# Patient Record
Sex: Male | Born: 1935
Health system: Southern US, Community
[De-identification: ages and names within clinical notes are randomized; demographics above are authoritative.]

## PROBLEM LIST (undated history)

## (undated) ENCOUNTER — Emergency Department (HOSPITAL_COMMUNITY): Admission: EM | Payer: Medicare Other | Source: Home / Self Care

## (undated) DIAGNOSIS — I639 Cerebral infarction, unspecified: Secondary | ICD-10-CM

## (undated) DIAGNOSIS — K635 Polyp of colon: Secondary | ICD-10-CM

## (undated) DIAGNOSIS — E785 Hyperlipidemia, unspecified: Secondary | ICD-10-CM

## (undated) DIAGNOSIS — M1990 Unspecified osteoarthritis, unspecified site: Secondary | ICD-10-CM

## (undated) DIAGNOSIS — I10 Essential (primary) hypertension: Secondary | ICD-10-CM

## (undated) DIAGNOSIS — G8929 Other chronic pain: Secondary | ICD-10-CM

## (undated) DIAGNOSIS — F0390 Unspecified dementia without behavioral disturbance: Secondary | ICD-10-CM

## (undated) DIAGNOSIS — E119 Type 2 diabetes mellitus without complications: Secondary | ICD-10-CM

## (undated) DIAGNOSIS — K5790 Diverticulosis of intestine, part unspecified, without perforation or abscess without bleeding: Secondary | ICD-10-CM

## (undated) DIAGNOSIS — F32A Depression, unspecified: Secondary | ICD-10-CM

## (undated) DIAGNOSIS — M25551 Pain in right hip: Secondary | ICD-10-CM

## (undated) DIAGNOSIS — J189 Pneumonia, unspecified organism: Secondary | ICD-10-CM

## (undated) DIAGNOSIS — F329 Major depressive disorder, single episode, unspecified: Secondary | ICD-10-CM

## (undated) HISTORY — PX: TONSILLECTOMY: SUR1361

## (undated) HISTORY — DX: Hyperlipidemia, unspecified: E78.5

## (undated) HISTORY — DX: Diverticulosis of intestine, part unspecified, without perforation or abscess without bleeding: K57.90

## (undated) HISTORY — DX: Polyp of colon: K63.5

## (undated) HISTORY — DX: Cerebral infarction, unspecified: I63.9

## (undated) HISTORY — DX: Essential (primary) hypertension: I10

## (undated) HISTORY — DX: Unspecified osteoarthritis, unspecified site: M19.90

## (undated) HISTORY — PX: INGUINAL HERNIA REPAIR: SUR1180

---

## 2002-09-07 ENCOUNTER — Encounter: Payer: Self-pay | Admitting: Emergency Medicine

## 2002-09-08 ENCOUNTER — Inpatient Hospital Stay (HOSPITAL_COMMUNITY): Admission: AD | Admit: 2002-09-08 | Discharge: 2002-09-11 | Payer: Self-pay | Admitting: Psychiatry

## 2008-12-29 ENCOUNTER — Ambulatory Visit: Payer: Self-pay | Admitting: Internal Medicine

## 2008-12-29 ENCOUNTER — Encounter (INDEPENDENT_AMBULATORY_CARE_PROVIDER_SITE_OTHER): Payer: Self-pay | Admitting: Internal Medicine

## 2008-12-29 ENCOUNTER — Inpatient Hospital Stay (HOSPITAL_COMMUNITY): Admission: EM | Admit: 2008-12-29 | Discharge: 2009-01-10 | Payer: Self-pay | Admitting: Emergency Medicine

## 2008-12-30 ENCOUNTER — Encounter (INDEPENDENT_AMBULATORY_CARE_PROVIDER_SITE_OTHER): Payer: Self-pay | Admitting: Internal Medicine

## 2009-01-02 ENCOUNTER — Encounter (INDEPENDENT_AMBULATORY_CARE_PROVIDER_SITE_OTHER): Payer: Self-pay | Admitting: Neurology

## 2009-01-03 ENCOUNTER — Ambulatory Visit: Payer: Self-pay | Admitting: Physical Medicine & Rehabilitation

## 2009-01-10 ENCOUNTER — Ambulatory Visit: Payer: Self-pay | Admitting: Physical Medicine & Rehabilitation

## 2009-01-10 ENCOUNTER — Inpatient Hospital Stay (HOSPITAL_COMMUNITY)
Admission: RE | Admit: 2009-01-10 | Discharge: 2009-01-20 | Payer: Self-pay | Admitting: Physical Medicine & Rehabilitation

## 2009-02-27 ENCOUNTER — Encounter
Admission: RE | Admit: 2009-02-27 | Discharge: 2009-02-28 | Payer: Self-pay | Admitting: Physical Medicine & Rehabilitation

## 2009-02-28 ENCOUNTER — Ambulatory Visit: Payer: Self-pay | Admitting: Physical Medicine & Rehabilitation

## 2010-02-18 ENCOUNTER — Encounter: Payer: Self-pay | Admitting: Physical Medicine & Rehabilitation

## 2010-04-30 LAB — GLUCOSE, CAPILLARY
Glucose-Capillary: 107 mg/dL — ABNORMAL HIGH (ref 70–99)
Glucose-Capillary: 145 mg/dL — ABNORMAL HIGH (ref 70–99)
Glucose-Capillary: 151 mg/dL — ABNORMAL HIGH (ref 70–99)
Glucose-Capillary: 159 mg/dL — ABNORMAL HIGH (ref 70–99)
Glucose-Capillary: 168 mg/dL — ABNORMAL HIGH (ref 70–99)
Glucose-Capillary: 184 mg/dL — ABNORMAL HIGH (ref 70–99)
Glucose-Capillary: 251 mg/dL — ABNORMAL HIGH (ref 70–99)
Glucose-Capillary: 254 mg/dL — ABNORMAL HIGH (ref 70–99)
Glucose-Capillary: 268 mg/dL — ABNORMAL HIGH (ref 70–99)
Glucose-Capillary: 281 mg/dL — ABNORMAL HIGH (ref 70–99)
Glucose-Capillary: 294 mg/dL — ABNORMAL HIGH (ref 70–99)
Glucose-Capillary: 348 mg/dL — ABNORMAL HIGH (ref 70–99)
Glucose-Capillary: 61 mg/dL — ABNORMAL LOW (ref 70–99)
Glucose-Capillary: 62 mg/dL — ABNORMAL LOW (ref 70–99)
Glucose-Capillary: 78 mg/dL (ref 70–99)
Glucose-Capillary: 81 mg/dL (ref 70–99)
Glucose-Capillary: 82 mg/dL (ref 70–99)

## 2010-04-30 LAB — CBC
Platelets: 376 10*3/uL (ref 150–400)
RBC: 4.11 MIL/uL — ABNORMAL LOW (ref 4.22–5.81)
WBC: 11.4 10*3/uL — ABNORMAL HIGH (ref 4.0–10.5)

## 2010-04-30 LAB — BASIC METABOLIC PANEL
Calcium: 9.2 mg/dL (ref 8.4–10.5)
Creatinine, Ser: 0.98 mg/dL (ref 0.4–1.5)
GFR calc Af Amer: >60 mL/min (ref >60)

## 2010-05-01 LAB — BASIC METABOLIC PANEL
BUN: 10 mg/dL (ref 6–23)
BUN: 12 mg/dL (ref 6–23)
BUN: 12 mg/dL (ref 6–23)
BUN: 12 mg/dL (ref 6–23)
BUN: 15 mg/dL (ref 6–23)
BUN: 5 mg/dL — ABNORMAL LOW (ref 6–23)
BUN: 5 mg/dL — ABNORMAL LOW (ref 6–23)
BUN: 7 mg/dL (ref 6–23)
BUN: 7 mg/dL (ref 6–23)
CO2: 23 mEq/L (ref 19–32)
CO2: 23 mEq/L (ref 19–32)
CO2: 24 mEq/L (ref 19–32)
CO2: 24 mEq/L (ref 19–32)
CO2: 25 mEq/L (ref 19–32)
CO2: 25 mEq/L (ref 19–32)
CO2: 26 mEq/L (ref 19–32)
CO2: 26 mEq/L (ref 19–32)
CO2: 27 mEq/L (ref 19–32)
Calcium: 8.5 mg/dL (ref 8.4–10.5)
Calcium: 8.7 mg/dL (ref 8.4–10.5)
Calcium: 8.7 mg/dL (ref 8.4–10.5)
Calcium: 9 mg/dL (ref 8.4–10.5)
Calcium: 9 mg/dL (ref 8.4–10.5)
Calcium: 9.2 mg/dL (ref 8.4–10.5)
Calcium: 9.5 mg/dL (ref 8.4–10.5)
Calcium: 9.8 mg/dL (ref 8.4–10.5)
Chloride: 102 mEq/L (ref 96–112)
Chloride: 102 mEq/L (ref 96–112)
Chloride: 104 mEq/L (ref 96–112)
Chloride: 105 mEq/L (ref 96–112)
Chloride: 105 mEq/L (ref 96–112)
Chloride: 106 mEq/L (ref 96–112)
Chloride: 107 mEq/L (ref 96–112)
Chloride: 107 mEq/L (ref 96–112)
Chloride: 107 mEq/L (ref 96–112)
Creatinine, Ser: 0.74 mg/dL (ref 0.4–1.5)
Creatinine, Ser: 0.81 mg/dL (ref 0.4–1.5)
Creatinine, Ser: 0.82 mg/dL (ref 0.4–1.5)
Creatinine, Ser: 0.83 mg/dL (ref 0.4–1.5)
Creatinine, Ser: 0.84 mg/dL (ref 0.4–1.5)
Creatinine, Ser: 0.89 mg/dL (ref 0.4–1.5)
Creatinine, Ser: 0.9 mg/dL (ref 0.4–1.5)
Creatinine, Ser: 1.09 mg/dL (ref 0.4–1.5)
GFR calc Af Amer: >60 mL/min (ref >60)
GFR calc Af Amer: >60 mL/min (ref >60)
GFR calc Af Amer: >60 mL/min (ref >60)
GFR calc Af Amer: >60 mL/min (ref >60)
GFR calc Af Amer: >60 mL/min (ref >60)
GFR calc Af Amer: >60 mL/min (ref >60)
GFR calc Af Amer: >60 mL/min (ref >60)
GFR calc non Af Amer: >60 mL/min (ref >60)
GFR calc non Af Amer: >60 mL/min (ref >60)
GFR calc non Af Amer: >60 mL/min (ref >60)
GFR calc non Af Amer: >60 mL/min (ref >60)
GFR calc non Af Amer: >60 mL/min (ref >60)
GFR calc non Af Amer: >60 mL/min (ref >60)
GFR calc non Af Amer: >60 mL/min (ref >60)
GFR calc non Af Amer: >60 mL/min (ref >60)
GFR calc non Af Amer: >60 mL/min (ref >60)
GFR calc non Af Amer: >60 mL/min (ref >60)
GFR calc non Af Amer: >60 mL/min (ref >60)
Glucose, Bld: 166 mg/dL — ABNORMAL HIGH (ref 70–99)
Glucose, Bld: 193 mg/dL — ABNORMAL HIGH (ref 70–99)
Glucose, Bld: 199 mg/dL — ABNORMAL HIGH (ref 70–99)
Glucose, Bld: 214 mg/dL — ABNORMAL HIGH (ref 70–99)
Glucose, Bld: 221 mg/dL — ABNORMAL HIGH (ref 70–99)
Glucose, Bld: 222 mg/dL — ABNORMAL HIGH (ref 70–99)
Glucose, Bld: 223 mg/dL — ABNORMAL HIGH (ref 70–99)
Glucose, Bld: 230 mg/dL — ABNORMAL HIGH (ref 70–99)
Glucose, Bld: 234 mg/dL — ABNORMAL HIGH (ref 70–99)
Glucose, Bld: 246 mg/dL — ABNORMAL HIGH (ref 70–99)
Potassium: 3.3 mEq/L — ABNORMAL LOW (ref 3.5–5.1)
Potassium: 3.4 mEq/L — ABNORMAL LOW (ref 3.5–5.1)
Potassium: 3.7 mEq/L (ref 3.5–5.1)
Potassium: 3.7 mEq/L (ref 3.5–5.1)
Potassium: 3.8 mEq/L (ref 3.5–5.1)
Potassium: 4 mEq/L (ref 3.5–5.1)
Sodium: 134 mEq/L — ABNORMAL LOW (ref 135–145)
Sodium: 134 mEq/L — ABNORMAL LOW (ref 135–145)
Sodium: 134 mEq/L — ABNORMAL LOW (ref 135–145)
Sodium: 135 mEq/L (ref 135–145)
Sodium: 135 mEq/L (ref 135–145)
Sodium: 136 mEq/L (ref 135–145)
Sodium: 136 mEq/L (ref 135–145)
Sodium: 137 mEq/L (ref 135–145)
Sodium: 138 mEq/L (ref 135–145)
Sodium: 138 mEq/L (ref 135–145)
Sodium: 138 mEq/L (ref 135–145)
Sodium: 138 mEq/L (ref 135–145)

## 2010-05-01 LAB — CBC
HCT: 40.7 % (ref 39.0–52.0)
HCT: 42 % (ref 39.0–52.0)
HCT: 42.5 % (ref 39.0–52.0)
HCT: 44 % (ref 39.0–52.0)
HCT: 44.3 % (ref 39.0–52.0)
Hemoglobin: 14.3 g/dL (ref 13.0–17.0)
Hemoglobin: 15 g/dL (ref 13.0–17.0)
Hemoglobin: 15 g/dL (ref 13.0–17.0)
Hemoglobin: 17.1 g/dL — ABNORMAL HIGH (ref 13.0–17.0)
MCHC: 33.7 g/dL (ref 30.0–36.0)
MCHC: 33.8 g/dL (ref 30.0–36.0)
MCHC: 33.8 g/dL (ref 30.0–36.0)
MCV: 90.7 fL (ref 78.0–100.0)
MCV: 91.8 fL (ref 78.0–100.0)
MCV: 92.5 fL (ref 78.0–100.0)
Platelets: 189 10*3/uL (ref 150–400)
Platelets: 203 10*3/uL (ref 150–400)
Platelets: 304 10*3/uL (ref 150–400)
RBC: 4.45 MIL/uL (ref 4.22–5.81)
RBC: 4.48 MIL/uL (ref 4.22–5.81)
RBC: 4.58 MIL/uL (ref 4.22–5.81)
RBC: 4.69 MIL/uL (ref 4.22–5.81)
RBC: 4.82 MIL/uL (ref 4.22–5.81)
RBC: 4.83 MIL/uL (ref 4.22–5.81)
RBC: 5.47 MIL/uL (ref 4.22–5.81)
RDW: 15 % (ref 11.5–15.5)
RDW: 15 % (ref 11.5–15.5)
RDW: 15.4 % (ref 11.5–15.5)
WBC: 10.2 10*3/uL (ref 4.0–10.5)
WBC: 12.6 10*3/uL — ABNORMAL HIGH (ref 4.0–10.5)
WBC: 12.7 10*3/uL — ABNORMAL HIGH (ref 4.0–10.5)
WBC: 15.1 10*3/uL — ABNORMAL HIGH (ref 4.0–10.5)
WBC: 22.1 10*3/uL — ABNORMAL HIGH (ref 4.0–10.5)

## 2010-05-01 LAB — RAPID URINE DRUG SCREEN, HOSP PERFORMED
Amphetamines: NOT DETECTED
Barbiturates: NOT DETECTED
Benzodiazepines: NOT DETECTED
Opiates: NOT DETECTED

## 2010-05-01 LAB — GLUCOSE, CAPILLARY
Glucose-Capillary: 100 mg/dL — ABNORMAL HIGH (ref 70–99)
Glucose-Capillary: 112 mg/dL — ABNORMAL HIGH (ref 70–99)
Glucose-Capillary: 112 mg/dL — ABNORMAL HIGH (ref 70–99)
Glucose-Capillary: 118 mg/dL — ABNORMAL HIGH (ref 70–99)
Glucose-Capillary: 123 mg/dL — ABNORMAL HIGH (ref 70–99)
Glucose-Capillary: 129 mg/dL — ABNORMAL HIGH (ref 70–99)
Glucose-Capillary: 136 mg/dL — ABNORMAL HIGH (ref 70–99)
Glucose-Capillary: 137 mg/dL — ABNORMAL HIGH (ref 70–99)
Glucose-Capillary: 137 mg/dL — ABNORMAL HIGH (ref 70–99)
Glucose-Capillary: 141 mg/dL — ABNORMAL HIGH (ref 70–99)
Glucose-Capillary: 142 mg/dL — ABNORMAL HIGH (ref 70–99)
Glucose-Capillary: 144 mg/dL — ABNORMAL HIGH (ref 70–99)
Glucose-Capillary: 144 mg/dL — ABNORMAL HIGH (ref 70–99)
Glucose-Capillary: 145 mg/dL — ABNORMAL HIGH (ref 70–99)
Glucose-Capillary: 148 mg/dL — ABNORMAL HIGH (ref 70–99)
Glucose-Capillary: 153 mg/dL — ABNORMAL HIGH (ref 70–99)
Glucose-Capillary: 162 mg/dL — ABNORMAL HIGH (ref 70–99)
Glucose-Capillary: 170 mg/dL — ABNORMAL HIGH (ref 70–99)
Glucose-Capillary: 174 mg/dL — ABNORMAL HIGH (ref 70–99)
Glucose-Capillary: 176 mg/dL — ABNORMAL HIGH (ref 70–99)
Glucose-Capillary: 178 mg/dL — ABNORMAL HIGH (ref 70–99)
Glucose-Capillary: 179 mg/dL — ABNORMAL HIGH (ref 70–99)
Glucose-Capillary: 181 mg/dL — ABNORMAL HIGH (ref 70–99)
Glucose-Capillary: 185 mg/dL — ABNORMAL HIGH (ref 70–99)
Glucose-Capillary: 187 mg/dL — ABNORMAL HIGH (ref 70–99)
Glucose-Capillary: 188 mg/dL — ABNORMAL HIGH (ref 70–99)
Glucose-Capillary: 189 mg/dL — ABNORMAL HIGH (ref 70–99)
Glucose-Capillary: 190 mg/dL — ABNORMAL HIGH (ref 70–99)
Glucose-Capillary: 197 mg/dL — ABNORMAL HIGH (ref 70–99)
Glucose-Capillary: 197 mg/dL — ABNORMAL HIGH (ref 70–99)
Glucose-Capillary: 199 mg/dL — ABNORMAL HIGH (ref 70–99)
Glucose-Capillary: 202 mg/dL — ABNORMAL HIGH (ref 70–99)
Glucose-Capillary: 206 mg/dL — ABNORMAL HIGH (ref 70–99)
Glucose-Capillary: 207 mg/dL — ABNORMAL HIGH (ref 70–99)
Glucose-Capillary: 211 mg/dL — ABNORMAL HIGH (ref 70–99)
Glucose-Capillary: 219 mg/dL — ABNORMAL HIGH (ref 70–99)
Glucose-Capillary: 220 mg/dL — ABNORMAL HIGH (ref 70–99)
Glucose-Capillary: 226 mg/dL — ABNORMAL HIGH (ref 70–99)
Glucose-Capillary: 234 mg/dL — ABNORMAL HIGH (ref 70–99)
Glucose-Capillary: 243 mg/dL — ABNORMAL HIGH (ref 70–99)
Glucose-Capillary: 255 mg/dL — ABNORMAL HIGH (ref 70–99)
Glucose-Capillary: 269 mg/dL — ABNORMAL HIGH (ref 70–99)
Glucose-Capillary: 456 mg/dL — ABNORMAL HIGH (ref 70–99)
Glucose-Capillary: 500 mg/dL — ABNORMAL HIGH (ref 70–99)
Glucose-Capillary: 55 mg/dL — ABNORMAL LOW (ref 70–99)
Glucose-Capillary: 67 mg/dL — ABNORMAL LOW (ref 70–99)
Glucose-Capillary: 80 mg/dL (ref 70–99)
Glucose-Capillary: 96 mg/dL (ref 70–99)
Glucose-Capillary: 96 mg/dL (ref 70–99)
Glucose-Capillary: 97 mg/dL (ref 70–99)

## 2010-05-01 LAB — COMPREHENSIVE METABOLIC PANEL
ALT: 28 U/L (ref 0–53)
ALT: 35 U/L (ref 0–53)
AST: 37 U/L (ref 0–37)
AST: 60 U/L — ABNORMAL HIGH (ref 0–37)
AST: 70 U/L — ABNORMAL HIGH (ref 0–37)
Albumin: 3.8 g/dL (ref 3.5–5.2)
Albumin: 4.8 g/dL (ref 3.5–5.2)
Alkaline Phosphatase: 76 U/L (ref 39–117)
Alkaline Phosphatase: 96 U/L (ref 39–117)
BUN: 27 mg/dL — ABNORMAL HIGH (ref 6–23)
CO2: 23 mEq/L (ref 19–32)
CO2: 26 mEq/L (ref 19–32)
CO2: 27 mEq/L (ref 19–32)
Calcium: 9.2 mg/dL (ref 8.4–10.5)
Chloride: 103 mEq/L (ref 96–112)
Chloride: 110 mEq/L (ref 96–112)
Creatinine, Ser: 1 mg/dL (ref 0.4–1.5)
Creatinine, Ser: 1.05 mg/dL (ref 0.4–1.5)
GFR calc Af Amer: 60 mL/min — ABNORMAL LOW (ref >60)
GFR calc Af Amer: >60 mL/min (ref >60)
GFR calc Af Amer: >60 mL/min (ref >60)
GFR calc non Af Amer: >60 mL/min (ref >60)
GFR calc non Af Amer: >60 mL/min (ref >60)
GFR calc non Af Amer: >60 mL/min (ref >60)
Glucose, Bld: 58 mg/dL — ABNORMAL LOW (ref 70–99)
Potassium: 3.5 mEq/L (ref 3.5–5.1)
Potassium: 4 mEq/L (ref 3.5–5.1)
Potassium: 5.6 mEq/L — ABNORMAL HIGH (ref 3.5–5.1)
Sodium: 138 mEq/L (ref 135–145)
Sodium: 138 mEq/L (ref 135–145)
Total Bilirubin: 1.1 mg/dL (ref 0.3–1.2)
Total Bilirubin: 1.5 mg/dL — ABNORMAL HIGH (ref 0.3–1.2)
Total Protein: 8.8 g/dL — ABNORMAL HIGH (ref 6.0–8.3)

## 2010-05-01 LAB — POCT I-STAT 3, ART BLOOD GAS (G3+)
Acid-base deficit: 7 mmol/L — ABNORMAL HIGH (ref 0.0–2.0)
O2 Saturation: 96 %
TCO2: 18 mmol/L (ref 0–100)
pH, Arterial: 7.381 (ref 7.350–7.450)

## 2010-05-01 LAB — URINE CULTURE: Colony Count: NO GROWTH

## 2010-05-01 LAB — DIFFERENTIAL
Basophils Absolute: 0.2 10*3/uL — ABNORMAL HIGH (ref 0.0–0.1)
Basophils Relative: 0 % (ref 0–1)
Basophils Relative: 1 % (ref 0–1)
Eosinophils Absolute: 0 10*3/uL (ref 0.0–0.7)
Eosinophils Relative: 0 % (ref 0–5)
Lymphocytes Relative: 13 % (ref 12–46)
Lymphocytes Relative: 20 % (ref 12–46)
Lymphs Abs: 3 10*3/uL (ref 0.7–4.0)
Monocytes Absolute: 2 10*3/uL — ABNORMAL HIGH (ref 0.1–1.0)
Monocytes Absolute: 2.1 10*3/uL — ABNORMAL HIGH (ref 0.1–1.0)
Monocytes Relative: 9 % (ref 3–12)
Neutro Abs: 17 10*3/uL — ABNORMAL HIGH (ref 1.7–7.7)
Neutrophils Relative %: 67 % (ref 43–77)

## 2010-05-01 LAB — URINALYSIS, ROUTINE W REFLEX MICROSCOPIC
Bilirubin Urine: NEGATIVE
Glucose, UA: >1000 mg/dL — AB
Glucose, UA: NEGATIVE mg/dL
Specific Gravity, Urine: 1.039 — ABNORMAL HIGH (ref 1.005–1.030)
Urobilinogen, UA: 0.2 mg/dL (ref 0.0–1.0)
pH: 6 (ref 5.0–8.0)

## 2010-05-01 LAB — LIPID PANEL
LDL Cholesterol: 178 mg/dL — ABNORMAL HIGH (ref 0–99)
Total CHOL/HDL Ratio: 6.9 RATIO
VLDL: 34 mg/dL (ref 0–40)

## 2010-05-01 LAB — CULTURE, BLOOD (ROUTINE X 2): Culture: NO GROWTH

## 2010-05-01 LAB — BLOOD GAS, ARTERIAL
Acid-base deficit: 2 mmol/L (ref 0.0–2.0)
Bicarbonate: 21.1 mEq/L (ref 20.0–24.0)
Drawn by: 257081
O2 Content: 2 L/min
TCO2: 22 mmol/L (ref 0–100)
pCO2 arterial: 29 mmHg — ABNORMAL LOW (ref 35.0–45.0)
pO2, Arterial: 98.8 mmHg (ref 80.0–100.0)

## 2010-05-01 LAB — PROTIME-INR: INR: 1.02 (ref 0.00–1.49)

## 2010-05-01 LAB — TROPONIN I
Troponin I: 0.05 ng/mL (ref 0.00–0.06)
Troponin I: 0.07 ng/mL — ABNORMAL HIGH (ref 0.00–0.06)
Troponin I: 0.1 ng/mL — ABNORMAL HIGH (ref 0.00–0.06)

## 2010-05-01 LAB — CK TOTAL AND CKMB (NOT AT ARMC)
CK, MB: 29.3 ng/mL — ABNORMAL HIGH (ref 0.3–4.0)
Relative Index: 1.1 (ref 0.0–2.5)
Relative Index: 1.3 (ref 0.0–2.5)

## 2010-05-01 LAB — MRSA PCR SCREENING: MRSA by PCR: NEGATIVE

## 2010-05-01 LAB — URINE MICROSCOPIC-ADD ON

## 2010-05-01 LAB — HEMOGLOBIN A1C: Hgb A1c MFr Bld: 11.5 % — ABNORMAL HIGH (ref 4.6–6.1)

## 2010-05-01 LAB — LACTIC ACID, PLASMA: Lactic Acid, Venous: 2.5 mmol/L — ABNORMAL HIGH (ref 0.5–2.2)

## 2010-05-01 LAB — ETHANOL: Alcohol, Ethyl (B): <5 mg/dL (ref 0–10)

## 2010-05-01 LAB — POTASSIUM: Potassium: 4.6 mEq/L (ref 3.5–5.1)

## 2011-02-18 DIAGNOSIS — M109 Gout, unspecified: Secondary | ICD-10-CM | POA: Diagnosis not present

## 2011-02-18 DIAGNOSIS — Z23 Encounter for immunization: Secondary | ICD-10-CM | POA: Diagnosis not present

## 2011-02-18 DIAGNOSIS — M259 Joint disorder, unspecified: Secondary | ICD-10-CM | POA: Diagnosis not present

## 2011-02-18 DIAGNOSIS — M25539 Pain in unspecified wrist: Secondary | ICD-10-CM | POA: Diagnosis not present

## 2011-02-18 DIAGNOSIS — M25439 Effusion, unspecified wrist: Secondary | ICD-10-CM | POA: Diagnosis not present

## 2011-02-27 DIAGNOSIS — H25099 Other age-related incipient cataract, unspecified eye: Secondary | ICD-10-CM | POA: Diagnosis not present

## 2011-03-01 DIAGNOSIS — I10 Essential (primary) hypertension: Secondary | ICD-10-CM | POA: Diagnosis not present

## 2011-03-01 DIAGNOSIS — R079 Chest pain, unspecified: Secondary | ICD-10-CM | POA: Diagnosis not present

## 2011-03-06 DIAGNOSIS — IMO0002 Reserved for concepts with insufficient information to code with codable children: Secondary | ICD-10-CM | POA: Diagnosis not present

## 2011-03-06 DIAGNOSIS — M549 Dorsalgia, unspecified: Secondary | ICD-10-CM | POA: Diagnosis not present

## 2011-03-06 DIAGNOSIS — R079 Chest pain, unspecified: Secondary | ICD-10-CM | POA: Diagnosis not present

## 2011-03-06 DIAGNOSIS — I10 Essential (primary) hypertension: Secondary | ICD-10-CM | POA: Diagnosis not present

## 2011-03-06 DIAGNOSIS — J90 Pleural effusion, not elsewhere classified: Secondary | ICD-10-CM | POA: Diagnosis not present

## 2011-04-09 DIAGNOSIS — E785 Hyperlipidemia, unspecified: Secondary | ICD-10-CM | POA: Diagnosis not present

## 2011-04-09 DIAGNOSIS — Z125 Encounter for screening for malignant neoplasm of prostate: Secondary | ICD-10-CM | POA: Diagnosis not present

## 2011-04-09 DIAGNOSIS — M109 Gout, unspecified: Secondary | ICD-10-CM | POA: Diagnosis not present

## 2011-04-09 DIAGNOSIS — E1129 Type 2 diabetes mellitus with other diabetic kidney complication: Secondary | ICD-10-CM | POA: Diagnosis not present

## 2011-04-12 DIAGNOSIS — F329 Major depressive disorder, single episode, unspecified: Secondary | ICD-10-CM | POA: Diagnosis not present

## 2011-04-12 DIAGNOSIS — N182 Chronic kidney disease, stage 2 (mild): Secondary | ICD-10-CM | POA: Diagnosis not present

## 2011-04-12 DIAGNOSIS — E1129 Type 2 diabetes mellitus with other diabetic kidney complication: Secondary | ICD-10-CM | POA: Diagnosis not present

## 2011-04-12 DIAGNOSIS — I10 Essential (primary) hypertension: Secondary | ICD-10-CM | POA: Diagnosis not present

## 2011-04-16 DIAGNOSIS — Z1212 Encounter for screening for malignant neoplasm of rectum: Secondary | ICD-10-CM | POA: Diagnosis not present

## 2011-04-22 ENCOUNTER — Encounter: Payer: Self-pay | Admitting: Internal Medicine

## 2011-05-22 ENCOUNTER — Ambulatory Visit (INDEPENDENT_AMBULATORY_CARE_PROVIDER_SITE_OTHER): Payer: Medicare Other | Admitting: Internal Medicine

## 2011-05-22 ENCOUNTER — Encounter: Payer: Self-pay | Admitting: Internal Medicine

## 2011-05-22 VITALS — BP 132/60 | HR 60 | Ht 70.0 in | Wt 212.0 lb

## 2011-05-22 DIAGNOSIS — G8190 Hemiplegia, unspecified affecting unspecified side: Secondary | ICD-10-CM | POA: Diagnosis not present

## 2011-05-22 DIAGNOSIS — G8191 Hemiplegia, unspecified affecting right dominant side: Secondary | ICD-10-CM

## 2011-05-22 DIAGNOSIS — I639 Cerebral infarction, unspecified: Secondary | ICD-10-CM

## 2011-05-22 DIAGNOSIS — I6350 Cerebral infarction due to unspecified occlusion or stenosis of unspecified cerebral artery: Secondary | ICD-10-CM

## 2011-05-22 DIAGNOSIS — R195 Other fecal abnormalities: Secondary | ICD-10-CM | POA: Diagnosis not present

## 2011-05-22 DIAGNOSIS — E119 Type 2 diabetes mellitus without complications: Secondary | ICD-10-CM

## 2011-05-22 MED ORDER — PEG-KCL-NACL-NASULF-NA ASC-C 100 G PO SOLR: 1.0000 | Freq: Once | ORAL | Status: DC

## 2011-05-22 NOTE — Patient Instructions (Signed)
You have been scheduled for an endoscopy and colonoscopy with propofol. Please follow written instructions given to you at your visit today.  Please pick up your prep kit at the pharmacy within the next 1-3 days. en

## 2011-05-22 NOTE — Progress Notes (Signed)
HISTORY OF PRESENT ILLNESS:  Casey Collins is a 76 y.o. male with history of hypertension, hyperlipidemia, insulin requiring diabetes mellitus, and massive cerebrovascular accident with residual right hemiparesis. He is referred today, by Dr. Jacky Kindle, regarding Hemoccult-positive stool identified on routine annual evaluation. The patient is accompanied by his son-in-law, [name]. Outside records have been reviewed. Office evaluation 04/12/2011. Hemoccult-positive stool March 19. No hemoglobin. Patient's GI review of systems is remarkable for oral pharyngeal dysphagia since his stroke. Otherwise negative review of systems. No prior history of GI problems, GI evaluations, or colonoscopy. His chronic medical problems are stable. He has limited mobility. He stays with his son-in-law and daughter.  REVIEW OF SYSTEMS:  All non-GI ROS negative except for right hemiparesis, arthritis, hearing problems, weakness  Past Medical History  Diagnosis Date  . Stroke     Right side   . Diabetes mellitus   . Arthritis   . Hypertension   . Hyperlipemia     No past surgical history on file.  Social History SRIHAN BRUTUS  reports that he has never smoked. He has never used smokeless tobacco. He reports that he does not drink alcohol or use illicit drugs.  family history includes Diabetes in his daughter and Prostate cancer in his brother.  There is no history of Colon cancer.  No Known Allergies     PHYSICAL EXAMINATION: Vital signs: BP 132/60  Pulse 60  Ht 5\' 10"  (1.778 m)  Wt 212 lb (96.163 kg)  BMI 30.42 kg/m2  Constitutional: chronically ill-appearing, no acute distress Psychiatric: alert and oriented x3, cooperative Eyes: extraocular movements intact, anicteric, conjunctiva pink Mouth: oral pharynx moist, no lesions Neck: supple no lymphadenopathy Cardiovascular: heart regular rate and rhythm, no murmur Lungs: clear to auscultation bilaterally Abdomen: soft, nontender, nondistended, no  obvious ascites, no peritoneal signs, normal bowel sounds, no organomegaly Rectal:deferred until colonoscopy Extremities: no lower extremity edema bilaterally Skin: no lesions on visible extremities Neuro: right-sided weakness of the upper and lower extremity   ASSESSMENT:  #1. Asymptomatic Hemoccult-positive stool in a patient on chronic antiplatelet therapy in the form of Plavix #2. Insulin-requiring diabetes mellitus #3. History of massive CVA with resultant right-sided paralysis   PLAN:  #1. We discussed the workup of Hemoccult-positive stool. We discussed possible causes of Hemoccult-positive stool. This was particularly important, given the context presented by this patient's medical problems. After reviewing the pros and cons, it was decided to proceed with outpatient colonoscopy and upper endoscopy.The nature of the procedure, as well as the risks, benefits, and alternatives were carefully and thoroughly reviewed with the patient. Ample time for discussion and questions allowed. The patient understood, was satisfied, and agreed to proceed. The patient is at high-risk. We addressed his antiplatelet therapy. He will continue on Plavix with possible limitations for large lesions requiring therapeutic excision. He will hold his a.m. Insulin the morning of the procedure. We will monitor his blood sugar before and after the procedure per protocol. His son-in-law to him and his daughter will assist him with his prep as well as ambulation and transportation.

## 2011-07-19 ENCOUNTER — Ambulatory Visit (AMBULATORY_SURGERY_CENTER): Payer: Medicare Other | Admitting: Internal Medicine

## 2011-07-19 ENCOUNTER — Encounter: Payer: Self-pay | Admitting: Internal Medicine

## 2011-07-19 VITALS — BP 145/75 | HR 68 | Temp 97.2°F | Resp 18 | Ht 70.0 in | Wt 212.0 lb

## 2011-07-19 DIAGNOSIS — D126 Benign neoplasm of colon, unspecified: Secondary | ICD-10-CM

## 2011-07-19 DIAGNOSIS — D128 Benign neoplasm of rectum: Secondary | ICD-10-CM

## 2011-07-19 DIAGNOSIS — R195 Other fecal abnormalities: Secondary | ICD-10-CM | POA: Diagnosis not present

## 2011-07-19 DIAGNOSIS — K222 Esophageal obstruction: Secondary | ICD-10-CM | POA: Diagnosis not present

## 2011-07-19 DIAGNOSIS — D129 Benign neoplasm of anus and anal canal: Secondary | ICD-10-CM | POA: Diagnosis not present

## 2011-07-19 DIAGNOSIS — K5730 Diverticulosis of large intestine without perforation or abscess without bleeding: Secondary | ICD-10-CM

## 2011-07-19 MED ORDER — SODIUM CHLORIDE 0.9 % IV SOLN: 500.0000 mL | INTRAVENOUS | Status: DC

## 2011-07-19 NOTE — Op Note (Signed)
[facility] [address] [place], [place]  [address]  COLONOSCOPY PROCEDURE REPORT  PATIENT:  Casey Collins, Casey Collins  MR#:  [id number] BIRTHDATE:  1935/04/25, 76 yrs. old  GENDER:  male ENDOSCOPIST:  Wilhemina Bonito. Eda Keys, MD REF. BY:  Geoffry Paradise, M.D. PROCEDURE DATE:  07/19/2011 PROCEDURE:  Colonoscopy with snare polypectomy x 7, Colon w/ prophyllactic endoscopic clipping x 2 for reduced bleeding risk ASA CLASS:  Class III INDICATIONS:  heme positive stool MEDICATIONS:   MAC sedation, administered by CRNA, propofol (Diprivan) 340 mg IV  DESCRIPTION OF PROCEDURE:   After the risks benefits and alternatives of the procedure were thoroughly explained, informed consent was obtained.  Digital rectal exam was performed and revealed no abnormalities.   The LB CF-H180AL [id number] endoscope was introduced through the anus and advanced to the cecum, which was identified by both the appendix and ileocecal valve, without limitations.  The quality of the prep was excellent, using MoviPrep.  The instrument was then slowly withdrawn as the colon was fully examined. <<PROCEDUREIMAGES>>  FINDINGS:  Five polyps, all < 5mm, were found in the transverse colon and snared without cautery. Retrieval was successful. A 2.5cm pedunculated polyp was found in the sigmoid colon and snared, then cauterized with monopolar cautery. Retrieval was successful. Endoclip placed along base. A 1cm pedunculated polyp was found in the rectum and snared, then cauterized with monopolar cautery. Retrieval was successful. Endoclip placed along base. Moderate diverticulosis was found found scattered throught the colon.   Retroflexed views in the rectum revealed internal hemorrhoids.    The time to cecum = 4:12  minutes. The scope was then withdrawn in  19:07  minutes from the cecum and the procedure completed.  COMPLICATIONS:  None  ENDOSCOPIC IMPRESSION: 1) Five polyps in the transverse colon - removed 2)  Pedunculated polyp in the sigmoid colon - removed then clipped 3) Pedunculated polyp in the rectum- removed then clipped 4) Moderate diverticulosis found scattered throught the colon 5) Internal hemorrhoids  RECOMMENDATIONS: 1) Repeat Colonoscopy in 3 years, if medically fit 2) CONTINUE PLAVIX .  ______________________________ Wilhemina Bonito. Eda Keys, MD  CC:  Geoffry Paradise, MD; The Patient  n. eSIGNED:   Wilhemina Bonito. Eda Keys at 07/19/2011 02:46 PM  Nicholaus Corolla, [phone]

## 2011-07-19 NOTE — Op Note (Signed)
[facility] [address] [place], [place]  [address]  ENDOSCOPY PROCEDURE REPORT  PATIENT:  Casey Collins, Casey Collins  MR#:  [id number] BIRTHDATE:  03-06-1935, 76 yrs. old  GENDER:  male  ENDOSCOPIST:  Wilhemina Bonito. Eda Keys, MD Referred by:  Geoffry Paradise, M.D.  PROCEDURE DATE:  07/19/2011 PROCEDURE:  EGD, diagnostic 43235 ASA CLASS:  Class III INDICATIONS:  hemoccult positive stool  MEDICATIONS:   MAC sedation, administered by CRNA, propofol (Diprivan) 80 mg IV TOPICAL ANESTHETIC:  none  DESCRIPTION OF PROCEDURE:   After the risks benefits and alternatives of the procedure were thoroughly explained, informed consent was obtained.  The LB GIF-H180 [id number] endoscope was introduced through the mouth and advanced to the second portion of the duodenum, without limitations.  The instrument was slowly withdrawn as the mucosa was fully examined. <<PROCEDUREIMAGES>>  A benign ring-like large caliber stricture was found in the distal esophagus.  Otherwise the examination was normal to D2. Retroflexed views revealed no abnormalities.    The scope was then withdrawn from the patient and the procedure completed.  COMPLICATIONS:  None  ENDOSCOPIC IMPRESSION: 1) Benign Stricture in the distal esophagus 2) Otherwise normal examination  RECOMMENDATIONS: 1) GI Follow-up  PRN  ______________________________ Wilhemina Bonito. Eda Keys, MD  CC:  Geoffry Paradise, MDThe Patient  n. Rosalie DoctorWilhemina Bonito. Eda Keys at 07/19/2011 02:54 PM  Nicholaus Corolla, [phone]

## 2011-07-19 NOTE — Progress Notes (Signed)
Patient did not experience any of the following events: a burn prior to discharge; a fall within the facility; wrong site/side/patient/procedure/implant event; or a hospital transfer or hospital admission upon discharge from the facility. (G8907) Patient did not have preoperative order for IV antibiotic SSI prophylaxis. (G8918)  

## 2011-07-19 NOTE — Patient Instructions (Addendum)

## 2011-07-22 ENCOUNTER — Telehealth: Payer: Self-pay | Admitting: *Deleted

## 2011-07-22 NOTE — Telephone Encounter (Signed)
Left message to call office if questions or concerns. 

## 2011-07-23 ENCOUNTER — Encounter: Payer: Self-pay | Admitting: Internal Medicine

## 2011-08-13 DIAGNOSIS — I10 Essential (primary) hypertension: Secondary | ICD-10-CM | POA: Diagnosis not present

## 2011-08-13 DIAGNOSIS — E1159 Type 2 diabetes mellitus with other circulatory complications: Secondary | ICD-10-CM | POA: Diagnosis not present

## 2011-08-13 DIAGNOSIS — F329 Major depressive disorder, single episode, unspecified: Secondary | ICD-10-CM | POA: Diagnosis not present

## 2011-08-13 DIAGNOSIS — E1129 Type 2 diabetes mellitus with other diabetic kidney complication: Secondary | ICD-10-CM | POA: Diagnosis not present

## 2011-10-14 ENCOUNTER — Telehealth: Payer: Self-pay | Admitting: Internal Medicine

## 2011-10-14 NOTE — Telephone Encounter (Signed)
Unable to leave message on voicemail.  Pts son-in-law called back and pt was scheduled to see Mike Gip PA 10/18/11@1 :30pm. Pt aware of appt date and time.

## 2011-10-14 NOTE — Telephone Encounter (Signed)
Pt had ECL 07/19/11. Pts son-in-law states that on Saturday the pt had a bowel movement and reports that he passed a "clot" and there was some bright red blood. Sunday the same thing happened and there was another "clot" and blood present. Colon report states that there were 2 polyps that were removed and the stalks were clipped.  Pt was concerned and wanted to know if there is anything that he should do differently. Dr. Marina Goodell please advise.

## 2011-10-14 NOTE — Telephone Encounter (Signed)
At this point, most likely related to internal hemorrhoids. He can see Amy in office this week

## 2011-10-18 ENCOUNTER — Ambulatory Visit (INDEPENDENT_AMBULATORY_CARE_PROVIDER_SITE_OTHER): Payer: Medicare Other | Admitting: Physician Assistant

## 2011-10-18 ENCOUNTER — Encounter: Payer: Self-pay | Admitting: Physician Assistant

## 2011-10-18 VITALS — BP 130/70 | HR 60 | Ht 70.0 in | Wt 206.0 lb

## 2011-10-18 DIAGNOSIS — Z86010 Personal history of colonic polyps: Secondary | ICD-10-CM | POA: Insufficient documentation

## 2011-10-18 DIAGNOSIS — K5730 Diverticulosis of large intestine without perforation or abscess without bleeding: Secondary | ICD-10-CM

## 2011-10-18 DIAGNOSIS — I10 Essential (primary) hypertension: Secondary | ICD-10-CM

## 2011-10-18 DIAGNOSIS — Z8673 Personal history of transient ischemic attack (TIA), and cerebral infarction without residual deficits: Secondary | ICD-10-CM

## 2011-10-18 DIAGNOSIS — M1990 Unspecified osteoarthritis, unspecified site: Secondary | ICD-10-CM

## 2011-10-18 DIAGNOSIS — K5790 Diverticulosis of intestine, part unspecified, without perforation or abscess without bleeding: Secondary | ICD-10-CM | POA: Insufficient documentation

## 2011-10-18 DIAGNOSIS — Z7901 Long term (current) use of anticoagulants: Secondary | ICD-10-CM | POA: Diagnosis not present

## 2011-10-18 DIAGNOSIS — K648 Other hemorrhoids: Secondary | ICD-10-CM

## 2011-10-18 DIAGNOSIS — K625 Hemorrhage of anus and rectum: Secondary | ICD-10-CM

## 2011-10-18 DIAGNOSIS — M3390 Dermatopolymyositis, unspecified, organ involvement unspecified: Secondary | ICD-10-CM

## 2011-10-18 MED ORDER — HYDROCORTISONE ACETATE 25 MG RE SUPP: 25.0000 mg | Freq: Two times a day (BID) | RECTAL | Status: DC

## 2011-10-18 MED ORDER — HYDROCORTISONE ACETATE 25 MG RE SUPP: RECTAL | Status: DC

## 2011-10-18 NOTE — Progress Notes (Signed)
Agree with Ms. Esterwood's assessment and plan. Carl E. Gessner, MD, FACG   

## 2011-10-18 NOTE — Progress Notes (Signed)
Subjective:    Patient ID: Casey Collins, male    DOB: [birth year], 76 y.o.   MRN: [id number]  HPI Maksym is a pleasant 76 year old white male known to Dr. Yancey Flemings. He has history of a CVA and is maintained on chronic Plavix and aspirin, also with history of diabetes mellitus hypertension and arthritis He had endoscopy in June of 2013 which did show a distal stricture which was not dilated and colonoscopy June of 2013 with 5 polyps removed the transverse colon, scattered diverticulosis and a rectal polyp also removed with finding of low-grade dysplasia at the margin. All the polyps were consistent with tubular adenomas he was also noted to have internal hemorrhoids. The patient called earlier this week with complaint of rectal bleeding. Apparently he had a small amount of rectal bleeding about 5 days ago and past a small blood clot on 2 occasions which he says for about the size of the end of his finger. Since that time he has not seen any further bleeding his bowel movements have been normal area he says he had been a bit constipated prior to that episode of bleeding. He has no complaints of rectal discomfort.    Review of Systems  Constitutional: Negative.   HENT: Negative.   Eyes: Negative.   Respiratory: Negative.   Cardiovascular: Negative.   Gastrointestinal: Positive for blood in stool and anal bleeding.  Genitourinary: Negative.   Musculoskeletal: Negative.   Neurological: Negative.   Hematological: Negative.   Psychiatric/Behavioral: Negative.    Outpatient Prescriptions Prior to Visit  Medication Sig Dispense Refill  . citalopram (CELEXA) 10 MG tablet Takes one tablet by mouth once daily      . clopidogrel (PLAVIX) 75 MG tablet One tablet by mouth once daily      . Eszopiclone (LUNESTA PO) Take by mouth as directed.      . insulin aspart (NOVOLOG) 100 UNIT/ML injection Inject into the skin. Sliding scale      . insulin glargine (LANTUS) 100 UNIT/ML injection Inject 20 Units  into the skin 2 (two) times daily.      Marland Kitchen LORAZEPAM PO Take by mouth as directed.      . metoprolol tartrate (LOPRESSOR) 25 MG tablet One tablet by mouth twice a day      . ramipril (ALTACE) 5 MG capsule One tablet by mouth once daily      . simvastatin (ZOCOR) 40 MG tablet One tablet by mouth once daily      . zolpidem (AMBIEN) 10 MG tablet One tablet by mouth as directed       No Known Allergies     Patient Active Problem List  Diagnosis  . H/O: CVA (cerebrovascular accident)  . Chronic anticoagulation  . Diverticulosis  . Hx of adenomatous colonic polyps  . Internal hemorrhoids  . HTN (hypertension)  . DM (dermatomyositis)  . Osteoarthritis   History   Social History  . Marital Status: Married    Spouse Name: N/A    Number of Children: N/A  . Years of Education: N/A   Occupational History  . Retired     Social History Main Topics  . Smoking status: Never Smoker   . Smokeless tobacco: Never Used  . Alcohol Use: No  . Drug Use: No  . Sexually Active: Not on file   Other Topics Concern  . Not on file   Social History Narrative   No caffeine     Objective:   Physical Exam well-developed elderly  white male in no acute distress, accompanied by his son-in-law. Patient has had prior CVA with right-sided weakness. Blood pressure 130/70 pulse 60. HEENT; nontraumatic normocephalic EOMI PERRLA sclera anicteric,Neck; Supple no JVD, Cardiovascular; regular rate and rhythm with S1-S2 no murmur or gallop, Pulmonary; clear bilaterally, Abdomen;soft and nontender nondistended no palpable mass or hepatosplenomegaly, Rectal; exam no external lesion noted on anoscopy he does have 2 inflamed internal hemorrhoids one of which has a small amount of blood oozing, he is nontender to exam., Extremities; no clubbing cyanosis or edema skin warm and dry, Psych;mood and affect appropriate, speech is slow        Assessment & Plan:  #54 76 year old male with small volume hematochezia in the  setting of chronic Plavix use. He does have inflamed internal hemorrhoids one of which was oozing a small amount of blood #2 multiple colon polyps on recent colonoscopy, one with low-grade dysplasia. He is due for followup colonoscopy in 3 years #3 diverticulosis #4 history of CVA #5 diabetes mellitus  Plan; Anusol-HC suppositories twice daily x1 week then bedtime daily x1 week then as needed  He and his son-in-law are asked to call back should his bleeding recur and/or increased in volume. Otherwise he will followup with Dr. Marina Goodell as needed and we'll plan followup colonoscopy in 3 years

## 2011-10-18 NOTE — Patient Instructions (Addendum)
We sent prescription to [facility] for the suppositories.  Use as directed. Call us if your symptoms worsen.

## 2011-11-21 DIAGNOSIS — E1129 Type 2 diabetes mellitus with other diabetic kidney complication: Secondary | ICD-10-CM | POA: Diagnosis not present

## 2011-11-21 DIAGNOSIS — Z23 Encounter for immunization: Secondary | ICD-10-CM | POA: Diagnosis not present

## 2011-11-21 DIAGNOSIS — E1159 Type 2 diabetes mellitus with other circulatory complications: Secondary | ICD-10-CM | POA: Diagnosis not present

## 2011-11-21 DIAGNOSIS — F329 Major depressive disorder, single episode, unspecified: Secondary | ICD-10-CM | POA: Diagnosis not present

## 2011-11-21 DIAGNOSIS — I10 Essential (primary) hypertension: Secondary | ICD-10-CM | POA: Diagnosis not present

## 2012-02-03 DIAGNOSIS — J069 Acute upper respiratory infection, unspecified: Secondary | ICD-10-CM | POA: Diagnosis not present

## 2012-02-03 DIAGNOSIS — I10 Essential (primary) hypertension: Secondary | ICD-10-CM | POA: Diagnosis not present

## 2012-05-28 DIAGNOSIS — E1129 Type 2 diabetes mellitus with other diabetic kidney complication: Secondary | ICD-10-CM | POA: Diagnosis not present

## 2012-05-28 DIAGNOSIS — I69959 Hemiplegia and hemiparesis following unspecified cerebrovascular disease affecting unspecified side: Secondary | ICD-10-CM | POA: Diagnosis not present

## 2012-05-28 DIAGNOSIS — F329 Major depressive disorder, single episode, unspecified: Secondary | ICD-10-CM | POA: Diagnosis not present

## 2012-05-28 DIAGNOSIS — E669 Obesity, unspecified: Secondary | ICD-10-CM | POA: Diagnosis not present

## 2012-05-28 DIAGNOSIS — N182 Chronic kidney disease, stage 2 (mild): Secondary | ICD-10-CM | POA: Diagnosis not present

## 2012-05-28 DIAGNOSIS — E1159 Type 2 diabetes mellitus with other circulatory complications: Secondary | ICD-10-CM | POA: Diagnosis not present

## 2012-05-28 DIAGNOSIS — Z1331 Encounter for screening for depression: Secondary | ICD-10-CM | POA: Diagnosis not present

## 2012-05-28 DIAGNOSIS — I10 Essential (primary) hypertension: Secondary | ICD-10-CM | POA: Diagnosis not present

## 2012-10-08 DIAGNOSIS — E1159 Type 2 diabetes mellitus with other circulatory complications: Secondary | ICD-10-CM | POA: Diagnosis not present

## 2012-10-08 DIAGNOSIS — I10 Essential (primary) hypertension: Secondary | ICD-10-CM | POA: Diagnosis not present

## 2012-10-08 DIAGNOSIS — Z125 Encounter for screening for malignant neoplasm of prostate: Secondary | ICD-10-CM | POA: Diagnosis not present

## 2012-10-08 DIAGNOSIS — M109 Gout, unspecified: Secondary | ICD-10-CM | POA: Diagnosis not present

## 2012-10-15 DIAGNOSIS — Z23 Encounter for immunization: Secondary | ICD-10-CM | POA: Diagnosis not present

## 2012-10-15 DIAGNOSIS — E1159 Type 2 diabetes mellitus with other circulatory complications: Secondary | ICD-10-CM | POA: Diagnosis not present

## 2012-10-15 DIAGNOSIS — I10 Essential (primary) hypertension: Secondary | ICD-10-CM | POA: Diagnosis not present

## 2012-10-15 DIAGNOSIS — N182 Chronic kidney disease, stage 2 (mild): Secondary | ICD-10-CM | POA: Diagnosis not present

## 2012-10-15 DIAGNOSIS — E1129 Type 2 diabetes mellitus with other diabetic kidney complication: Secondary | ICD-10-CM | POA: Diagnosis not present

## 2012-10-15 DIAGNOSIS — I69959 Hemiplegia and hemiparesis following unspecified cerebrovascular disease affecting unspecified side: Secondary | ICD-10-CM | POA: Diagnosis not present

## 2012-10-15 DIAGNOSIS — Z0000 Encounter for general adult medical examination without abnormal findings: Secondary | ICD-10-CM | POA: Diagnosis not present

## 2012-10-15 DIAGNOSIS — F329 Major depressive disorder, single episode, unspecified: Secondary | ICD-10-CM | POA: Diagnosis not present

## 2012-10-15 DIAGNOSIS — M109 Gout, unspecified: Secondary | ICD-10-CM | POA: Diagnosis not present

## 2012-10-16 DIAGNOSIS — Z1212 Encounter for screening for malignant neoplasm of rectum: Secondary | ICD-10-CM | POA: Diagnosis not present

## 2012-12-01 ENCOUNTER — Encounter (HOSPITAL_COMMUNITY): Payer: Self-pay | Admitting: Emergency Medicine

## 2012-12-01 ENCOUNTER — Emergency Department (HOSPITAL_COMMUNITY): Payer: Medicare Other

## 2012-12-01 ENCOUNTER — Inpatient Hospital Stay (HOSPITAL_COMMUNITY)
Admission: EM | Admit: 2012-12-01 | Discharge: 2012-12-04 | DRG: 065 | Disposition: A | Discharge status: Home Health | Payer: Medicare Other | Attending: Internal Medicine | Admitting: Internal Medicine

## 2012-12-01 DIAGNOSIS — I10 Essential (primary) hypertension: Secondary | ICD-10-CM | POA: Diagnosis present

## 2012-12-01 DIAGNOSIS — M1990 Unspecified osteoarthritis, unspecified site: Secondary | ICD-10-CM | POA: Diagnosis present

## 2012-12-01 DIAGNOSIS — M3390 Dermatopolymyositis, unspecified, organ involvement unspecified: Secondary | ICD-10-CM | POA: Diagnosis not present

## 2012-12-01 DIAGNOSIS — Z8042 Family history of malignant neoplasm of prostate: Secondary | ICD-10-CM

## 2012-12-01 DIAGNOSIS — K648 Other hemorrhoids: Secondary | ICD-10-CM

## 2012-12-01 DIAGNOSIS — R1312 Dysphagia, oropharyngeal phase: Secondary | ICD-10-CM | POA: Diagnosis present

## 2012-12-01 DIAGNOSIS — I6350 Cerebral infarction due to unspecified occlusion or stenosis of unspecified cerebral artery: Principal | ICD-10-CM

## 2012-12-01 DIAGNOSIS — R29898 Other symptoms and signs involving the musculoskeletal system: Secondary | ICD-10-CM | POA: Diagnosis present

## 2012-12-01 DIAGNOSIS — Z8673 Personal history of transient ischemic attack (TIA), and cerebral infarction without residual deficits: Secondary | ICD-10-CM

## 2012-12-01 DIAGNOSIS — Z7982 Long term (current) use of aspirin: Secondary | ICD-10-CM | POA: Diagnosis not present

## 2012-12-01 DIAGNOSIS — I639 Cerebral infarction, unspecified: Secondary | ICD-10-CM | POA: Diagnosis present

## 2012-12-01 DIAGNOSIS — IMO0001 Reserved for inherently not codable concepts without codable children: Secondary | ICD-10-CM | POA: Diagnosis present

## 2012-12-01 DIAGNOSIS — Z794 Long term (current) use of insulin: Secondary | ICD-10-CM | POA: Diagnosis not present

## 2012-12-01 DIAGNOSIS — R4701 Aphasia: Secondary | ICD-10-CM | POA: Diagnosis present

## 2012-12-01 DIAGNOSIS — R1310 Dysphagia, unspecified: Secondary | ICD-10-CM

## 2012-12-01 DIAGNOSIS — I6330 Cerebral infarction due to thrombosis of unspecified cerebral artery: Secondary | ICD-10-CM | POA: Diagnosis not present

## 2012-12-01 DIAGNOSIS — E785 Hyperlipidemia, unspecified: Secondary | ICD-10-CM | POA: Diagnosis present

## 2012-12-01 DIAGNOSIS — Z86010 Personal history of colonic polyps: Secondary | ICD-10-CM

## 2012-12-01 DIAGNOSIS — Z79899 Other long term (current) drug therapy: Secondary | ICD-10-CM | POA: Diagnosis not present

## 2012-12-01 DIAGNOSIS — N179 Acute kidney failure, unspecified: Secondary | ICD-10-CM | POA: Diagnosis present

## 2012-12-01 DIAGNOSIS — K5790 Diverticulosis of intestine, part unspecified, without perforation or abscess without bleeding: Secondary | ICD-10-CM

## 2012-12-01 DIAGNOSIS — M3313 Other dermatomyositis without myopathy: Secondary | ICD-10-CM | POA: Diagnosis present

## 2012-12-01 DIAGNOSIS — Z7902 Long term (current) use of antithrombotics/antiplatelets: Secondary | ICD-10-CM | POA: Diagnosis not present

## 2012-12-01 DIAGNOSIS — Z7901 Long term (current) use of anticoagulants: Secondary | ICD-10-CM

## 2012-12-01 DIAGNOSIS — R29810 Facial weakness: Secondary | ICD-10-CM | POA: Diagnosis present

## 2012-12-01 DIAGNOSIS — I6529 Occlusion and stenosis of unspecified carotid artery: Secondary | ICD-10-CM | POA: Diagnosis not present

## 2012-12-01 DIAGNOSIS — I69998 Other sequelae following unspecified cerebrovascular disease: Secondary | ICD-10-CM | POA: Diagnosis not present

## 2012-12-01 DIAGNOSIS — Z833 Family history of diabetes mellitus: Secondary | ICD-10-CM | POA: Diagnosis not present

## 2012-12-01 DIAGNOSIS — I359 Nonrheumatic aortic valve disorder, unspecified: Secondary | ICD-10-CM | POA: Diagnosis not present

## 2012-12-01 DIAGNOSIS — I672 Cerebral atherosclerosis: Secondary | ICD-10-CM | POA: Diagnosis not present

## 2012-12-01 LAB — CBC WITH DIFFERENTIAL/PLATELET
Basophils Absolute: 0 K/uL (ref 0.0–0.1)
Basophils Relative: 0 % (ref 0–1)
Eosinophils Absolute: 0.5 K/uL (ref 0.0–0.7)
Eosinophils Relative: 5 % (ref 0–5)
HCT: 41.9 % (ref 39.0–52.0)
Hemoglobin: 14.3 g/dL (ref 13.0–17.0)
Lymphocytes Relative: 34 % (ref 12–46)
Lymphs Abs: 3.3 K/uL (ref 0.7–4.0)
MCH: 31.8 pg (ref 26.0–34.0)
MCHC: 34.1 g/dL (ref 30.0–36.0)
MCV: 93.3 fL (ref 78.0–100.0)
Monocytes Absolute: 1 K/uL (ref 0.1–1.0)
Monocytes Relative: 10 % (ref 3–12)
Neutro Abs: 5.1 K/uL (ref 1.7–7.7)
Neutrophils Relative %: 51 % (ref 43–77)
Platelets: 225 K/uL (ref 150–400)
RBC: 4.49 MIL/uL (ref 4.22–5.81)
RDW: 13.3 % (ref 11.5–15.5)
WBC: 9.9 K/uL (ref 4.0–10.5)

## 2012-12-01 LAB — POCT I-STAT, CHEM 8
Calcium, Ion: 1.29 mmol/L (ref 1.13–1.30)
Chloride: 105 mEq/L (ref 96–112)
Creatinine, Ser: 1.6 mg/dL — ABNORMAL HIGH (ref 0.50–1.35)
Glucose, Bld: 85 mg/dL (ref 70–99)
Hemoglobin: 15 g/dL (ref 13.0–17.0)
Potassium: 5.1 mEq/L (ref 3.5–5.1)

## 2012-12-01 LAB — GLUCOSE, CAPILLARY: Glucose-Capillary: 82 mg/dL (ref 70–99)

## 2012-12-01 LAB — TROPONIN I: Troponin I: <0.3 ng/mL

## 2012-12-01 MED ORDER — LORAZEPAM 2 MG/ML IJ SOLN
1.0000 mg | Freq: Once | INTRAMUSCULAR | Status: AC
  Administered 2012-12-01: 1 mg via INTRAVENOUS
  Filled 2012-12-01: qty 1

## 2012-12-01 MED ORDER — FAMOTIDINE IN NACL 20-0.9 MG/50ML-% IV SOLN
20.0000 mg | Freq: Once | INTRAVENOUS | Status: AC
  Administered 2012-12-01: 20 mg via INTRAVENOUS
  Filled 2012-12-01: qty 50

## 2012-12-01 MED ORDER — DEXAMETHASONE SODIUM PHOSPHATE 10 MG/ML IJ SOLN
10.0000 mg | Freq: Once | INTRAMUSCULAR | Status: AC
  Administered 2012-12-01: 10 mg via INTRAVENOUS
  Filled 2012-12-01: qty 1

## 2012-12-01 MED ORDER — SODIUM CHLORIDE 0.9 % IV SOLN
INTRAVENOUS | Status: DC
  Administered 2012-12-01 – 2012-12-04 (×2): via INTRAVENOUS

## 2012-12-01 MED ORDER — DIPHENHYDRAMINE HCL 50 MG/ML IJ SOLN
25.0000 mg | Freq: Once | INTRAMUSCULAR | Status: AC
  Administered 2012-12-01: 25 mg via INTRAVENOUS
  Filled 2012-12-01: qty 1

## 2012-12-01 NOTE — ED Notes (Signed)
Pt is here with family for "episodes of weakness and slurred speech"  Pt has had 3 episodes of slurred speech, drooling and increased right sided weakness.  Pt has slurred speech in triage and right facial droop (pre existing, more now per family).  Pt also reports numbness.  Pt was normal at noon (seen by family) and noticed that he was having difficulty speaking at 1630 with drooling and numbness and weakness.  Pt is alert and oriented.  Family at bedside.  Charge RN called for room.

## 2012-12-01 NOTE — ED Notes (Signed)
MD at bedside. 

## 2012-12-01 NOTE — Consult Note (Addendum)
Referring Physician: Dr. Rhunette Croft    Chief Complaint: Acute onset of speech difficulty.  HPI: Casey Collins is an 77 y.o. male a history of diabetes mellitus, hypertension, hyperlipidemia and previous stroke, presenting with new onset speech difficulty which started at noon today. Patient had difficulty with swallowing in addition to forming words. He was noted to have right facial droop and increased weakness involving right extremities. . Once he began to speak family members noted that speech was slurred. His been taking Plavix 75 mg per day. CT scan of his head showed no acute intracranial abnormality. NIH stroke score was 3. Patient was not a candidate for TPA because he was well out of the time window for treatment consideration. As well, deficits had improved significantly by the time he sought medical attention.  LSN: 12:00 noon 12/01/2012 tPA Given: No: Minimal residual deficits; beyond time window for treatment consideration. MRankin: 1  Past Medical History  Diagnosis Date  . Stroke     Right side   . Diabetes mellitus   . Arthritis   . Hypertension   . Hyperlipemia   . Diverticulosis   . Colon polyp     Family History  Problem Relation Age of Onset  . Colon cancer Neg Hx   . Prostate cancer Brother   . Diabetes Daughter      Medications: I have reviewed the patient's current medications.  ROS: History obtained from child and the patient  General ROS: negative for - chills, fatigue, fever, night sweats, weight gain or weight loss Psychological ROS: negative for - behavioral disorder, hallucinations, memory difficulties, mood swings or suicidal ideation Ophthalmic ROS: negative for - blurry vision, double vision, eye pain or loss of vision ENT ROS: negative for - epistaxis, nasal discharge, oral lesions, sore throat, tinnitus or vertigo Allergy and Immunology ROS: negative for - hives or itchy/watery eyes Hematological and Lymphatic ROS: negative for - bleeding  problems, bruising or swollen lymph nodes Endocrine ROS: negative for - galactorrhea, hair pattern changes, polydipsia/polyuria or temperature intolerance Respiratory ROS: negative for - cough, hemoptysis, shortness of breath or wheezing Cardiovascular ROS: negative for - chest pain, dyspnea on exertion, edema or irregular heartbeat Gastrointestinal ROS: negative for - abdominal pain, diarrhea, hematemesis, nausea/vomiting or stool incontinence Genito-Urinary ROS: negative for - dysuria, hematuria, incontinence or urinary frequency/urgency Musculoskeletal ROS: negative for - joint swelling or muscular weakness Neurological ROS: as noted in HPI Dermatological ROS: negative for rash and skin lesion changes  Physical Examination: Blood pressure 153/73, pulse 50, temperature 98.2 F (36.8 C), temperature source Oral, resp. rate 11, height 5\' 10"  (1.778 m), weight 90.266 kg (199 lb), SpO2 98.00%.  Neurologic Examination: Mental Status: Alert, oriented, thought content appropriate.  Speech moderately slurred without evidence of aphasia. Able to follow commands without difficulty. Cranial Nerves: II-Visual fields were normal. III/IV/VI-Pupils were equal and reacted. Extraocular movements were full and conjugate.    V/VII-no facial numbness; mild right lower facial weakness. VIII-normal. X-moderate dysarthria. Motor: 5/5 bilaterally with normal tone and bulk Sensory: Slightly reduced perception of tactile sensation over right extremities compared to left extremities. Deep Tendon Reflexes: 2+ and symmetric. Plantars: Flexor bilaterally Cerebellar: Normal finger-to-nose testing.  Ct Head Wo Contrast  12/01/2012   CLINICAL DATA:  77 year old male code stroke. Slurred speech and difficulty swallowing. Initial encounter.  EXAM: CT HEAD WITHOUT CONTRAST  TECHNIQUE: Contiguous axial images were obtained from the base of the skull through the vertex without intravenous contrast.  COMPARISON:   01/04/2009 and earlier.  FINDINGS: Increased paranasal sinus mucosal thickening and opacification. Mastoids and tympanic cavities remain clear. No acute osseous abnormality identified. Visualized orbits and scalp soft tissues are within normal limits.  Calcified atherosclerosis at the skull base. Interval evolution of the left coronal radiata lacunar infarct which was subacute on the most recent comparison. Chronic lacunar infarcts in the bilateral deep gray matter nuclei and subcortical white matter. New focus of involvement in the left frontal lobe on series 2, image 26, but appears chronic. Small left inferior cerebellar lacunar infarcts also are new since 2010.  No acute intracranial hemorrhage identified. No midline shift, mass effect, or evidence of intracranial mass lesion. No ventriculomegaly. No evidence of cortically based acute infarction identified. No suspicious intracranial vascular hyperdensity.  IMPRESSION: Chronic small vessel ischemia, with progression of disease since 2010. No acute cortically based infarct or acute intracranial hemorrhage.  Study discussed by telephone with Dr. Amada Jupiter On 12/01/2012 at 19:08 .   Electronically Signed   By: Augusto Gamble M.D.   On: 12/01/2012 19:09    Assessment: 77 y.o. male with a history of previous left cerebral infarction as well as risk factors for stroke including hypertension, hyperlipidemia and diabetes mellitus, presenting with probable recurrent subcortical left cerebral infarction.  Stroke Risk Factors - diabetes mellitus, hyperlipidemia and hypertension  Plan: 1. HgbA1c, fasting lipid panel 2. MRI, MRA  of the brain without contrast 3. PT consult, OT consult, Speech consult 4. Echocardiogram 5. Carotid dopplers 6. Prophylactic therapy-Antiplatelet med: Plavix 7. Risk factor modification 8. Telemetry monitoring   C.R. Roseanne Reno, MD [facility] [phone]  12/01/2012, 7:30 PM

## 2012-12-01 NOTE — H&P (Addendum)
[facility] History and Physical  Patient: Casey Collins  HQI:[id number]  DOB: 17-Oct-1935  DOA: 12/01/2012  Referring physician: Dr. Rhunette Croft PCP: Minda Meo, MD  Consults: Treatment Team:  Md Stroke, MD   Chief Complaint: Aphasia  HPI: Casey Collins is a 77 y.o. male with Past medical history of diabetes mellitus, hypertension, dyslipidemia, CVA, right-sided weakness and speech issue. The patient presented today with the complaint of progressively worsening right-sided weakness that has been ongoing since this morning. There is no fever, chills, palpitation, dizziness, lightheadedness, Paul, trauma, chest pain abdominal pain or nausea or vomiting or diarrhea The symptoms initially happened and resolved within a few minutes and then recurred after half an hour again. He does have history of Bell's palsy. As per the daughter his condition is about the same since came to the ER. Patient denies any urinary symptoms.  Review of Systems: as mentioned in the history of present illness.  A Comprehensive review of the other systems is negative.  Past Medical History  Diagnosis Date  . Stroke     Right side   . Diabetes mellitus   . Arthritis   . Hypertension   . Hyperlipemia   . Diverticulosis   . Colon polyp    Past Surgical History  Procedure Laterality Date  . Bilateral inguinal hernia     Social History:  reports that he has never smoked. He has never used smokeless tobacco. He reports that he does not drink alcohol or use illicit drugs. Patient is coming from home. no Independent for most of his  ADL.  No Known Allergies  Family History  Problem Relation Age of Onset  . Colon cancer Neg Hx   . Prostate cancer Brother   . Diabetes Daughter     Prior to Admission medications   Medication Sig Start Date End Date Taking? Authorizing Provider  citalopram (CELEXA) 10 MG tablet Take 10 mg by mouth daily.  05/16/11  Yes Historical Provider, MD  clopidogrel  (PLAVIX) 75 MG tablet Take 75 mg by mouth daily with breakfast.  05/16/11  Yes Historical Provider, MD  insulin aspart (NOVOLOG) 100 UNIT/ML injection Inject 5 Units into the skin daily. Sliding scale   Yes Historical Provider, MD  insulin glargine (LANTUS) 100 UNIT/ML injection Inject 10 Units into the skin 2 (two) times daily.    Yes Historical Provider, MD  LORazepam (ATIVAN) 1 MG tablet Take 1 mg by mouth 2 (two) times daily as needed for anxiety.   Yes Historical Provider, MD  metoprolol tartrate (LOPRESSOR) 25 MG tablet Take 25 mg by mouth 2 (two) times daily.  04/20/11  Yes Historical Provider, MD  ramipril (ALTACE) 5 MG capsule Take 5 mg by mouth daily.  05/16/11  Yes Historical Provider, MD  simvastatin (ZOCOR) 40 MG tablet Take 40 mg by mouth at bedtime.  05/16/11  Yes Historical Provider, MD  zolpidem (AMBIEN) 10 MG tablet Take 10 mg by mouth at bedtime.  05/16/11  Yes Historical Provider, MD    Physical Exam: Filed Vitals:   12/01/12 2130 12/01/12 2145 12/01/12 2215 12/01/12 2236  BP: 160/77 156/67 124/107 139/116  Pulse: 48 45 49 69  Temp:    98.6 F (37 C)  TempSrc:    Oral  Resp: 13 10 14 20   Height:      Weight:      SpO2: 100% 89% 100% 97%    General: Alert, Awake and Oriented to Time, Place and Person. Appear in mild distress  Eyes: PERRL ENT: Oral Mucosa clear dry. Neck: no JVD, no Carotid Bruits  Cardiovascular: S1 and S2 Present,  aortic systolic Murmur, Peripheral Pulses Present Respiratory: Bilateral Air entry equal and Decreased, Clear to Auscultation,  no Crackles,no wheezes Abdomen: Bowel Sound Present, Soft and Non tender Skin: no Rash Extremities: no Pedal edema, no calf tenderness Neurologic: Mental status oriented to time place, Cranial Nerves pupils are reactive, dysarthria, right-sided facial droop, Motor strength equal bilaterally, Sensation equal bilaterally, reflexes intact knee jerk, babinski equivocal, Cerebellar test normal  finger-nose-finger.   Labs on Admission:  CBC:  Recent Labs Lab 12/01/12 1850 12/01/12 1905  WBC 9.9  --   NEUTROABS 5.1  --   HGB 14.3 15.0  HCT 41.9 44.0  MCV 93.3  --   PLT 225  --     CMP     Component Value Date/Time   NA 143 12/01/2012 1905   K 5.1 12/01/2012 1905   CL 105 12/01/2012 1905   CO2 28 01/16/2009 0629   GLUCOSE 85 12/01/2012 1905   BUN 29* 12/01/2012 1905   CREATININE 1.60* 12/01/2012 1905   CALCIUM 9.2 01/16/2009 0629   PROT 7.2 01/11/2009 0645   ALBUMIN 2.9* 01/11/2009 0645   AST 37 01/11/2009 0645   ALT 46 01/11/2009 0645   ALKPHOS 157* 01/11/2009 0645   BILITOT 0.6 01/11/2009 0645   GFRNONAA >60 01/16/2009 0629   GFRAA  Value: >60        The eGFR has been calculated using the MDRD equation. This calculation has not been validated in all clinical situations. eGFR's persistently <60 mL/min signify possible Chronic Kidney Disease. 01/16/2009 0629    No results found for this basename: LIPASE, AMYLASE,  in the last 168 hours No results found for this basename: AMMONIA,  in the last 168 hours  Cardiac Enzymes:  Recent Labs Lab 12/01/12 1903  TROPONINI <0.30    BNP (last 3 results) No results found for this basename: PROBNP,  in the last 8760 hours  Radiological Exams on Admission: Ct Head Wo Contrast  12/01/2012   CLINICAL DATA:  77 year old male code stroke. Slurred speech and difficulty swallowing. Initial encounter.  EXAM: CT HEAD WITHOUT CONTRAST  TECHNIQUE: Contiguous axial images were obtained from the base of the skull through the vertex without intravenous contrast.  COMPARISON:  01/04/2009 and earlier.  FINDINGS: Increased paranasal sinus mucosal thickening and opacification. Mastoids and tympanic cavities remain clear. No acute osseous abnormality identified. Visualized orbits and scalp soft tissues are within normal limits.  Calcified atherosclerosis at the skull base. Interval evolution of the left coronal radiata lacunar infarct which was  subacute on the most recent comparison. Chronic lacunar infarcts in the bilateral deep gray matter nuclei and subcortical white matter. New focus of involvement in the left frontal lobe on series 2, image 26, but appears chronic. Small left inferior cerebellar lacunar infarcts also are new since 2010.  No acute intracranial hemorrhage identified. No midline shift, mass effect, or evidence of intracranial mass lesion. No ventriculomegaly. No evidence of cortically based acute infarction identified. No suspicious intracranial vascular hyperdensity.  IMPRESSION: Chronic small vessel ischemia, with progression of disease since 2010. No acute cortically based infarct or acute intracranial hemorrhage.  Study discussed by telephone with Dr. Amada Jupiter On 12/01/2012 at 19:08 .   Electronically Signed   By: Augusto Gamble M.D.   On: 12/01/2012 19:09    EKG: Independently reviewed. normal sinus rhythm.  Assessment/Plan Principal Problem:   CVA (cerebral infarction)  Active Problems:   HTN (hypertension)   DM (dermatomyositis)   Osteoarthritis   1. CVA (cerebral infarction) The patient has persistent symptoms of speech aphasia as well as weakness on the right side. Considering his out of the window status is not a candidate for TPA. Patient also complains of some difficulty swallowing. Therefore we will admit him to the hospital. Obtained. Workup including MRI MRA echocardiogram and carotid duplex. Continue him on Plavix. Speech therapy physical therapy and occupational therapy consults in the morning. Keeping the patient n.p.o..  2. Difficulty swallowing,  And sense of lump in his throat. The patient does not appear to have any oral swelling or stridor. Patient has received steroids and antihistaminics in the ED continue to monitor .  3. Hypertension We will resume his antihypertensive once cleared by speech therapist. Continue to monitor  4.Diabetes mellitus Continue sliding scale.  DVT  Prophylaxis: subcutaneous Heparin Nutrition: npo  Code Status:  full  Family Communication:  family was present at bedside, opportunity was given to the family to ask question and all questions were answered satisfactorily at the time of interview. Disposition: Admitted to inpatient in telemetry.  Author: Lynden Oxford, MD [facility] Pager: [phone] 12/01/2012, 11:46 PM    If 7PM-7AM, please contact night-coverage [email or web address] Password [email or web address]

## 2012-12-01 NOTE — ED Notes (Signed)
Entered room to prepare patient for departure to inpatient bed. Patient with increased difficulty communicating. Patient still able to communicate name and location, but slurring of speech is exaggerated. Patient also noted with grimaced face an appeared uncomfortable. No pain reported, but states he feels as thought his neck is drawing up. Family explains that patient hasn't had his ativan this evening as well. MD informed of findings, medications ordered to treat new symptoms. Patient will remain in department 30 minutes post medications to monitor for improvement/reaction.

## 2012-12-02 ENCOUNTER — Inpatient Hospital Stay (HOSPITAL_COMMUNITY): Payer: Medicare Other

## 2012-12-02 DIAGNOSIS — R1310 Dysphagia, unspecified: Secondary | ICD-10-CM

## 2012-12-02 DIAGNOSIS — Z8673 Personal history of transient ischemic attack (TIA), and cerebral infarction without residual deficits: Secondary | ICD-10-CM | POA: Diagnosis not present

## 2012-12-02 DIAGNOSIS — I6330 Cerebral infarction due to thrombosis of unspecified cerebral artery: Secondary | ICD-10-CM | POA: Diagnosis not present

## 2012-12-02 DIAGNOSIS — I10 Essential (primary) hypertension: Secondary | ICD-10-CM | POA: Diagnosis not present

## 2012-12-02 DIAGNOSIS — N179 Acute kidney failure, unspecified: Secondary | ICD-10-CM

## 2012-12-02 DIAGNOSIS — I6350 Cerebral infarction due to unspecified occlusion or stenosis of unspecified cerebral artery: Secondary | ICD-10-CM | POA: Diagnosis not present

## 2012-12-02 DIAGNOSIS — I359 Nonrheumatic aortic valve disorder, unspecified: Secondary | ICD-10-CM | POA: Diagnosis not present

## 2012-12-02 LAB — COMPREHENSIVE METABOLIC PANEL
ALT: 15 U/L (ref 0–53)
AST: 21 U/L (ref 0–37)
Albumin: 3.8 g/dL (ref 3.5–5.2)
Alkaline Phosphatase: 64 U/L (ref 39–117)
Calcium: 9.2 mg/dL (ref 8.4–10.5)
Glucose, Bld: 178 mg/dL — ABNORMAL HIGH (ref 70–99)
Potassium: 4.5 mEq/L (ref 3.5–5.1)
Sodium: 139 mEq/L (ref 135–145)
Total Bilirubin: 0.4 mg/dL (ref 0.3–1.2)
Total Protein: 6.7 g/dL (ref 6.0–8.3)

## 2012-12-02 LAB — CBC WITH DIFFERENTIAL/PLATELET
Basophils Absolute: 0 10*3/uL (ref 0.0–0.1)
Basophils Relative: 0 % (ref 0–1)
Eosinophils Absolute: 0 10*3/uL (ref 0.0–0.7)
Eosinophils Relative: 0 % (ref 0–5)
Lymphocytes Relative: 11 % — ABNORMAL LOW (ref 12–46)
Lymphs Abs: 0.9 10*3/uL (ref 0.7–4.0)
MCH: 31.6 pg (ref 26.0–34.0)
Monocytes Absolute: 0.1 10*3/uL (ref 0.1–1.0)
Neutrophils Relative %: 88 % — ABNORMAL HIGH (ref 43–77)
Platelets: 192 10*3/uL (ref 150–400)
RBC: 4.43 MIL/uL (ref 4.22–5.81)
RDW: 13.3 % (ref 11.5–15.5)
WBC: 7.8 10*3/uL (ref 4.0–10.5)

## 2012-12-02 LAB — URINALYSIS, ROUTINE W REFLEX MICROSCOPIC
Bilirubin Urine: NEGATIVE
Glucose, UA: NEGATIVE mg/dL
Hgb urine dipstick: NEGATIVE
Leukocytes, UA: NEGATIVE
Protein, ur: NEGATIVE mg/dL
Specific Gravity, Urine: 1.012 (ref 1.005–1.030)
Urobilinogen, UA: 0.2 mg/dL (ref 0.0–1.0)

## 2012-12-02 LAB — LIPID PANEL
Cholesterol: 140 mg/dL (ref 0–200)
HDL: 34 mg/dL — ABNORMAL LOW (ref >39)
Total CHOL/HDL Ratio: 4.1 RATIO
VLDL: 18 mg/dL (ref 0–40)

## 2012-12-02 LAB — GLUCOSE, CAPILLARY: Glucose-Capillary: 205 mg/dL — ABNORMAL HIGH (ref 70–99)

## 2012-12-02 MED ORDER — HEPARIN SODIUM (PORCINE) 5000 UNIT/ML IJ SOLN
5000.0000 [IU] | Freq: Three times a day (TID) | INTRAMUSCULAR | Status: DC
  Administered 2012-12-02 – 2012-12-04 (×7): 5000 [IU] via SUBCUTANEOUS
  Filled 2012-12-02 (×10): qty 1

## 2012-12-02 MED ORDER — LORAZEPAM 1 MG PO TABS
1.0000 mg | ORAL_TABLET | Freq: Two times a day (BID) | ORAL | Status: DC | PRN
  Administered 2012-12-02 – 2012-12-03 (×2): 1 mg via ORAL
  Filled 2012-12-02 (×2): qty 1

## 2012-12-02 MED ORDER — ASPIRIN EC 325 MG PO TBEC
325.0000 mg | DELAYED_RELEASE_TABLET | Freq: Every day | ORAL | Status: DC
  Administered 2012-12-02 – 2012-12-04 (×3): 325 mg via ORAL
  Filled 2012-12-02 (×3): qty 1

## 2012-12-02 MED ORDER — RESOURCE THICKENUP CLEAR PO POWD
ORAL | Status: DC | PRN
  Filled 2012-12-02: qty 125

## 2012-12-02 MED ORDER — METOPROLOL TARTRATE 25 MG PO TABS
25.0000 mg | ORAL_TABLET | Freq: Two times a day (BID) | ORAL | Status: DC
  Administered 2012-12-02 – 2012-12-04 (×5): 25 mg via ORAL
  Filled 2012-12-02 (×6): qty 1

## 2012-12-02 MED ORDER — SIMVASTATIN 40 MG PO TABS
40.0000 mg | ORAL_TABLET | Freq: Every day | ORAL | Status: DC
  Administered 2012-12-02 – 2012-12-03 (×2): 40 mg via ORAL
  Filled 2012-12-02 (×4): qty 1

## 2012-12-02 MED ORDER — CLOPIDOGREL BISULFATE 75 MG PO TABS
75.0000 mg | ORAL_TABLET | Freq: Every day | ORAL | Status: DC
  Filled 2012-12-02: qty 1

## 2012-12-02 MED ORDER — LORAZEPAM 1 MG PO TABS: 1.0000 mg | ORAL_TABLET | Freq: Once | ORAL | Status: DC

## 2012-12-02 MED ORDER — INSULIN ASPART 100 UNIT/ML ~~LOC~~ SOLN
0.0000 [IU] | Freq: Four times a day (QID) | SUBCUTANEOUS | Status: DC
  Administered 2012-12-02 (×2): 2 [IU] via SUBCUTANEOUS
  Administered 2012-12-02: 3 [IU] via SUBCUTANEOUS
  Administered 2012-12-03: 2 [IU] via SUBCUTANEOUS
  Administered 2012-12-03: 3 [IU] via SUBCUTANEOUS

## 2012-12-02 MED ORDER — SENNOSIDES-DOCUSATE SODIUM 8.6-50 MG PO TABS: 1.0000 | ORAL_TABLET | Freq: Every evening | ORAL | Status: DC | PRN

## 2012-12-02 MED ORDER — ASPIRIN 300 MG RE SUPP
300.0000 mg | Freq: Every day | RECTAL | Status: DC
  Filled 2012-12-02: qty 1

## 2012-12-02 MED ORDER — CITALOPRAM HYDROBROMIDE 10 MG PO TABS
10.0000 mg | ORAL_TABLET | Freq: Every day | ORAL | Status: DC
  Administered 2012-12-02 – 2012-12-04 (×3): 10 mg via ORAL
  Filled 2012-12-02 (×3): qty 1

## 2012-12-02 NOTE — Procedures (Signed)
Objective Swallowing Evaluation: Modified Barium Swallowing Study  Patient Details  Name: Casey Collins MRN: [id number] Date of Birth: 1936-01-25  Today's Date: 12/02/2012 Time: 0905-0925 SLP Time Calculation (min): 20 min  Past Medical History:  Past Medical History  Diagnosis Date  . Stroke     Right side   . Diabetes mellitus   . Arthritis   . Hypertension   . Hyperlipemia   . Diverticulosis   . Colon polyp    Past Surgical History:  Past Surgical History  Procedure Laterality Date  . Bilateral inguinal hernia     HPI:  77 yo male adm to [facility] with slurred speech.  CT head negative for acute event.  PMH + Belll's Palsy resulting in right facial weakness (upper and lower), CVA Nov 2010 impairing speech and with Rt HP.  Pt speaking better at home per family and was starting to walk.       Assessment / Plan / Recommendation Clinical Impression  Dysphagia Diagnosis: Moderate oral phase dysphagia;Moderate pharyngeal phase dysphagia  Clinical impression: Moderate oropharyngeal dysphagia characterized by decreased oral bolus cohesion/control due to hypoglossal and glossopharyngeal involvement resulting in premature spillage into pharynx. Pharyngeal swallow was strong albeit delayed to pyriform sinus and vallecular space resulting in + audible aspiration of thin liquids - even with chin tuck posture.  Unfortunately cough is weak and nonprotective and aspiration appears to cause mild distress.   Chin tuck posture effectively protects airway with pudding, honey and even nectar.  However given severity of weakness of cough and distress with aspiration, recommend to start with honey thick liquids.  Skilled intervention included education of pt, family to precautions and reinforcement of strategies.    Pt noted to have secretions retained in airway that he was unable to clear- therefore recommend oral care after meals.    As pt has been coughing some with intake at home PTA, suspect  component of chronic aspiration - now with worsened significantly by this event.      Treatment Recommendation  Therapy as outlined in treatment plan below    Diet Recommendation Honey-thick liquid;Dysphagia 1 (Puree)   Liquid Administration via: Cup Medication Administration: Crushed with puree Supervision: Patient able to self feed;Full supervision/cueing for compensatory strategies Compensations: Slow rate;Small sips/bites;Check for pocketing Postural Changes and/or Swallow Maneuvers: Seated upright 90 degrees;Chin tuck    Other  Recommendations Recommended Consults: MBS Oral Care Recommendations:  (oral care after meals) Other Recommendations: Order thickener from pharmacy   Follow Up Recommendations  Inpatient Rehab    Frequency and Duration min 2x/week  2 weeks   Pertinent Vitals/Pain Afebrile, decreased    SLP Swallow Goals     General Date of Onset: 12/02/12 HPI: 77 yo male adm to [facility] with slurred speech.  CT head negative for acute event.  PMH + Belll's Palsy resulting in right facial weakness (upper and lower), CVA Nov 2010 impairing speech and with Rt HP.  Pt speaking better at home per family and was starting to walk.   Type of Study: Modified Barium Swallowing Study Previous Swallow Assessment: bedside eval in 2010 per family, no info found in epic Diet Prior to this Study: NPO Temperature Spikes Noted: No Respiratory Status: Room air History of Recent Intubation: No Behavior/Cognition: Alert;Cooperative;Pleasant mood Oral Cavity - Dentition:  (four upper teeth, lower dentition intact, pt has no dentures/partial) Oral Motor / Sensory Function: Impaired - see Bedside swallow eval Self-Feeding Abilities: Able to feed self Patient Positioning: Upright in chair Baseline Vocal  Quality: Breathy;Low vocal intensity Volitional Cough: Weak Volitional Swallow: Able to elicit Anatomy: Within functional limits Pharyngeal Secretions: Standing secretions in (comment)  (secretions mix with barium, + aspiration of secretions during MBS)    Reason for Referral   concern for aspiration after suspected neuro event  Oral Phase Oral Preparation/Oral Phase Oral Phase: Impaired Oral - Honey Oral - Honey Teaspoon: Delayed oral transit;Weak lingual manipulation;Reduced posterior propulsion Oral - Nectar Oral - Nectar Teaspoon: Reduced posterior propulsion;Weak lingual manipulation;Delayed oral transit Oral - Nectar Cup: Reduced posterior propulsion;Weak lingual manipulation;Delayed oral transit Oral - Nectar Straw: Reduced posterior propulsion;Weak lingual manipulation;Delayed oral transit Oral - Thin Oral - Thin Cup: Weak lingual manipulation;Reduced posterior propulsion;Delayed oral transit Oral - Solids Oral - Puree: Reduced posterior propulsion;Weak lingual manipulation;Delayed oral transit Oral - Regular: Reduced posterior propulsion;Weak lingual manipulation;Delayed oral transit;Impaired mastication Oral Phase - Comment Oral Phase - Comment: chin tuck posture effective for airway protection with nectar, honey, pudding and cracker - still with + overt audible asp of thin with chin tuck   Pharyngeal Phase Pharyngeal Phase Pharyngeal Phase: Impaired Pharyngeal - Honey Pharyngeal - Honey Teaspoon: Delayed swallow initiation;Premature spillage to pyriform sinuses;Premature spillage to valleculae Pharyngeal - Nectar Pharyngeal - Nectar Teaspoon: Delayed swallow initiation;Premature spillage to valleculae;Premature spillage to pyriform sinuses Pharyngeal - Nectar Cup: Delayed swallow initiation;Premature spillage to pyriform sinuses;Premature spillage to valleculae;Penetration/Aspiration during swallow Penetration/Aspiration details (nectar cup): Material enters airway, remains ABOVE vocal cords then ejected out Pharyngeal - Nectar Straw: Delayed swallow initiation;Premature spillage to valleculae;Premature spillage to pyriform sinuses Pharyngeal -  Thin Pharyngeal - Thin Cup: Significant aspiration (Amount);Penetration/Aspiration during swallow;Delayed swallow initiation;Premature spillage to valleculae;Premature spillage to pyriform sinuses Penetration/Aspiration details (thin cup): Material enters airway, passes BELOW cords and not ejected out despite cough attempt by patient Pharyngeal - Solids Pharyngeal - Puree: Penetration/Aspiration during swallow;Premature spillage to valleculae;Premature spillage to pyriform sinuses Penetration/Aspiration details (puree): Material enters airway, remains ABOVE vocal cords then ejected out Pharyngeal - Regular: Delayed swallow initiation;Premature spillage to valleculae Pharyngeal Phase - Comment Pharyngeal Comment: chin tuck posture effective for airway protection with nectar, honey, pudding and cracker - still with + overt audible asp of thin with chin tuck  Cervical Esophageal Phase    GO    Cervical Esophageal Phase Cervical Esophageal Phase: WFL (esophagus appeared clear upon sweep x2)         Donavan Burnet, MS [facility] SLP [phone]

## 2012-12-02 NOTE — Progress Notes (Signed)
Stroke Team Progress Note  HISTORY Casey Collins is an 77 y.o. male a history of diabetes mellitus, hypertension, hyperlipidemia and previous stroke, presenting with new onset speech difficulty which started at noon today. Patient had difficulty with swallowing in addition to forming words. He was noted to have right facial droop and increased weakness involving right extremities. . Once he began to speak family members noted that speech was slurred. His been taking Plavix 75 mg per day. CT scan of his head showed no acute intracranial abnormality. NIH stroke score was 3. Patient was not a candidate for TPA because he was well out of the time window for treatment consideration. As well, deficits had improved significantly by the time he sought medical attention.   LSN: 12:00 noon 12/01/2012  tPA Given: No: Minimal residual deficits; beyond time window for treatment consideration.  MRankin: 1   He was admitted for further evaluation and treatment.  SUBJECTIVE His family is at the bedside.  Overall he feels his condition is gradually improving. Having echo. Still with weak cough.  OBJECTIVE Most recent Vital Signs: Filed Vitals:   12/02/12 0312 12/02/12 0611 12/02/12 0830 12/02/12 1046  BP: 177/91 170/87 172/77 157/85  Pulse: 62 57 72 68  Temp: 97.9 F (36.6 C) 99.2 F (37.3 C) 97.4 F (36.3 C) 97.9 F (36.6 C)  TempSrc: Oral Oral Oral Oral  Resp: 20 18 18 18   Height:      Weight:      SpO2: 95% 95% 97% 96%   CBG (last 3)   Recent Labs  12/02/12 0058 12/02/12 0641 12/02/12 0951  GLUCAP 113* 170* 205*    IV Fluid Intake:   . sodium chloride 100 mL/hr at 12/01/12 1929    MEDICATIONS  . clopidogrel  75 mg Oral Q breakfast  . heparin  5,000 Units Subcutaneous Q8H  . insulin aspart  0-9 Units Subcutaneous Q6H  . metoprolol tartrate  25 mg Oral BID  . simvastatin  40 mg Oral QHS   PRN:  RESOURCE THICKENUP CLEAR, senna-docusate  Diet:  Dysphagia honey-thick  liquids Activity:  Bedrest DVT Prophylaxis:  Heparin SQ  CLINICALLY SIGNIFICANT STUDIES Basic Metabolic Panel:  Recent Labs Lab 12/01/12 1905 12/02/12 0638  NA 143 139  K 5.1 4.5  CL 105 106  CO2  --  25  GLUCOSE 85 178*  BUN 29* 23  CREATININE 1.60* 1.24  CALCIUM  --  9.2   Liver Function Tests:  Recent Labs Lab 12/02/12 0638  AST 21  ALT 15  ALKPHOS 64  BILITOT 0.4  PROT 6.7  ALBUMIN 3.8   CBC:  Recent Labs Lab 12/01/12 1850 12/01/12 1905 12/02/12 0638  WBC 9.9  --  7.8  NEUTROABS 5.1  --  6.9  HGB 14.3 15.0 14.0  HCT 41.9 44.0 40.7  MCV 93.3  --  91.9  PLT 225  --  192   Coagulation: No results found for this basename: LABPROT, INR,  in the last 168 hours Cardiac Enzymes:  Recent Labs Lab 12/01/12 1903  TROPONINI <0.30   Urinalysis:  Recent Labs Lab 12/02/12 0111  COLORURINE YELLOW  LABSPEC 1.012  PHURINE 6.0  GLUCOSEU NEGATIVE  HGBUR NEGATIVE  BILIRUBINUR NEGATIVE  KETONESUR NEGATIVE  PROTEINUR NEGATIVE  UROBILINOGEN 0.2  NITRITE NEGATIVE  LEUKOCYTESUR NEGATIVE   Lipid Panel    Component Value Date/Time   CHOL 140 12/02/2012 0638   TRIG 89 12/02/2012 0638   HDL 34* 12/02/2012 0638   CHOLHDL 4.1  12/02/2012 0638   VLDL 18 12/02/2012 0638   LDLCALC 88 12/02/2012 0638   HgbA1C  Lab Results  Component Value Date   HGBA1C  Value: 11.5 (NOTE) The ADA recommends the following therapeutic goal for glycemic control related to Hgb A1c measurement: Goal of therapy: <6.5 Hgb A1c  Reference: American Diabetes Association: Clinical Practice Recommendations 2010, Diabetes Care, 2010, 33: (Suppl  1).* 12/29/2008    Urine Drug Screen:     Component Value Date/Time   LABOPIA NONE DETECTED 12/29/2008 0225   COCAINSCRNUR NONE DETECTED 12/29/2008 0225   LABBENZ NONE DETECTED 12/29/2008 0225   AMPHETMU NONE DETECTED 12/29/2008 0225   THCU POSITIVE* 12/29/2008 0225   LABBARB  Value: NONE DETECTED        DRUG SCREEN FOR MEDICAL PURPOSES ONLY.  IF CONFIRMATION  IS NEEDED FOR ANY PURPOSE, NOTIFY LAB WITHIN 5 DAYS.        LOWEST DETECTABLE LIMITS FOR URINE DRUG SCREEN Drug Class       Cutoff (ng/mL) Amphetamine      1000 Barbiturate      200 Benzodiazepine   200 Tricyclics       300 Opiates          300 Cocaine          300 THC              50 12/29/2008 0225    Alcohol Level: No results found for this basename: ETH,  in the last 168 hours  Ct Head Wo Contrast 12/01/2012  Chronic small vessel ischemia, with progression of disease since 2010. No acute cortically based infarct or acute intracranial hemorrhage.   Dg Swallowing Func-speech Pathology 12/02/2012   Chales Abrahams, CCC-SLP     12/02/2012 10:01 AM Objective Swallowing Evaluation: Modified Barium Swallowing Study   Patient Details  Name: Casey Collins MRN: [id number] Date of Birth: 1935/09/03  Today's Date: 12/02/2012 Time: 0905-0925 SLP Time Calculation (min): 20 min  Past Medical History:  Past Medical History  Diagnosis Date  . Stroke     Right side   . Diabetes mellitus   . Arthritis   . Hypertension   . Hyperlipemia   . Diverticulosis   . Colon polyp    Past Surgical History:  Past Surgical History  Procedure Laterality Date  . Bilateral inguinal hernia     HPI:  77 yo male adm to [facility] with slurred speech.  CT head negative for  acute event.  PMH + Belll's Palsy resulting in right facial  weakness (upper and lower), CVA Nov 2010 impairing speech and  with Rt HP.  Pt speaking better at home per family and was  starting to walk.       Assessment / Plan / Recommendation Clinical Impression  Dysphagia Diagnosis: Moderate oral phase dysphagia;Moderate  pharyngeal phase dysphagia  Clinical impression: Moderate oropharyngeal dysphagia  characterized by decreased oral bolus cohesion/control due to  hypoglossal and glossopharyngeal involvement resulting in  premature spillage into pharynx. Pharyngeal swallow was strong  albeit delayed to pyriform sinus and vallecular space resulting  in + audible aspiration of thin  liquids - even with chin tuck  posture.  Unfortunately cough is weak and nonprotective and  aspiration appears to cause mild distress.   Chin tuck posture  effectively protects airway with pudding, honey and even nectar.   However given severity of weakness of cough and distress with  aspiration, recommend to start with honey thick liquids.  Skilled  intervention included  education of pt, family to precautions and  reinforcement of strategies.    Pt noted to have secretions retained in airway that he was unable  to clear- therefore recommend oral care after meals.    As pt has been coughing some with intake at home PTA, suspect  component of chronic aspiration - now with worsened significantly  by this event.      Treatment Recommendation  Therapy as outlined in treatment plan below    Diet Recommendation Honey-thick liquid;Dysphagia 1 (Puree)   Liquid Administration via: Cup Medication Administration: Crushed with puree Supervision: Patient able to self feed;Full supervision/cueing  for compensatory strategies Compensations: Slow rate;Small sips/bites;Check for pocketing Postural Changes and/or Swallow Maneuvers: Seated upright 90  degrees;Chin tuck    Other  Recommendations Recommended Consults: MBS Oral Care Recommendations:  (oral care after meals) Other Recommendations: Order thickener from pharmacy   Follow Up Recommendations  Inpatient Rehab    Frequency and Duration min 2x/week  2 weeks   Pertinent Vitals/Pain Afebrile, decreased    SLP Swallow Goals     General Date of Onset: 12/02/12 HPI: 77 yo male adm to [facility] with slurred speech.  CT head negative  for acute event.  PMH + Belll's Palsy resulting in right facial  weakness (upper and lower), CVA Nov 2010 impairing speech and  with Rt HP.  Pt speaking better at home per family and was  starting to walk.   Type of Study: Modified Barium Swallowing Study Previous Swallow Assessment: bedside eval in 2010 per family, no  info found in epic Diet Prior to this  Study: NPO Temperature Spikes Noted: No Respiratory Status: Room air History of Recent Intubation: No Behavior/Cognition: Alert;Cooperative;Pleasant mood Oral Cavity - Dentition:  (four upper teeth, lower dentition  intact, pt has no dentures/partial) Oral Motor / Sensory Function: Impaired - see Bedside swallow  eval Self-Feeding Abilities: Able to feed self Patient Positioning: Upright in chair Baseline Vocal Quality: Breathy;Low vocal intensity Volitional Cough: Weak Volitional Swallow: Able to elicit Anatomy: Within functional limits Pharyngeal Secretions: Standing secretions in (comment)  (secretions mix with barium, + aspiration of secretions during  MBS)    Reason for Referral   concern for aspiration after suspected  neuro event  Oral Phase Oral Preparation/Oral Phase Oral Phase: Impaired Oral - Honey Oral - Honey Teaspoon: Delayed oral transit;Weak lingual  manipulation;Reduced posterior propulsion Oral - Nectar Oral - Nectar Teaspoon: Reduced posterior propulsion;Weak lingual  manipulation;Delayed oral transit Oral - Nectar Cup: Reduced posterior propulsion;Weak lingual  manipulation;Delayed oral transit Oral - Nectar Straw: Reduced posterior propulsion;Weak lingual  manipulation;Delayed oral transit Oral - Thin Oral - Thin Cup: Weak lingual manipulation;Reduced posterior  propulsion;Delayed oral transit Oral - Solids Oral - Puree: Reduced posterior propulsion;Weak lingual  manipulation;Delayed oral transit Oral - Regular: Reduced posterior propulsion;Weak lingual  manipulation;Delayed oral transit;Impaired mastication Oral Phase - Comment Oral Phase - Comment: chin tuck posture effective for airway  protection with nectar, honey, pudding and cracker - still with +  overt audible asp of thin with chin tuck   Pharyngeal Phase Pharyngeal Phase Pharyngeal Phase: Impaired Pharyngeal - Honey Pharyngeal - Honey Teaspoon: Delayed swallow initiation;Premature  spillage to pyriform sinuses;Premature spillage to  valleculae Pharyngeal - Nectar Pharyngeal - Nectar Teaspoon: Delayed swallow  initiation;Premature spillage to valleculae;Premature spillage to  pyriform sinuses Pharyngeal - Nectar Cup: Delayed swallow initiation;Premature  spillage to pyriform sinuses;Premature spillage to  valleculae;Penetration/Aspiration during swallow Penetration/Aspiration details (nectar cup): Material enters  airway, remains ABOVE vocal cords  then ejected out Pharyngeal - Nectar Straw: Delayed swallow initiation;Premature  spillage to valleculae;Premature spillage to pyriform sinuses Pharyngeal - Thin Pharyngeal - Thin Cup: Significant aspiration  (Amount);Penetration/Aspiration during swallow;Delayed swallow  initiation;Premature spillage to valleculae;Premature spillage to  pyriform sinuses Penetration/Aspiration details (thin cup): Material enters  airway, passes BELOW cords and not ejected out despite cough  attempt by patient Pharyngeal - Solids Pharyngeal - Puree: Penetration/Aspiration during  swallow;Premature spillage to valleculae;Premature spillage to  pyriform sinuses Penetration/Aspiration details (puree): Material enters airway,  remains ABOVE vocal cords then ejected out Pharyngeal - Regular: Delayed swallow initiation;Premature  spillage to valleculae Pharyngeal Phase - Comment Pharyngeal Comment: chin tuck posture effective for airway  protection with nectar, honey, pudding and cracker - still with +  overt audible asp of thin with chin tuck  Cervical Esophageal Phase    GO    Cervical Esophageal Phase Cervical Esophageal Phase: WFL (esophagus appeared clear upon  sweep x2)         Donavan Burnet, MS [facility] SLP [phone]    MRI of the brain    MRA of the brain    2D Echocardiogram  F 55%, wall motion normal. No cardiac source of emboli identified.  Carotid Doppler  Right: 1-39% ICA stenosis. Left: 40-59% ICA stenosis. Vertebral artery flow is antegrade  CXR    EKG  normal sinus rhythm.   Therapy Recommendations  CIR  Physical Exam   Mental Status:  Alert, oriented, thought content appropriate. Speech moderately slurred without evidence of aphasia. Able to follow commands without difficulty.  Cranial Nerves:  II-Visual fields were normal.  III/IV/VI-Pupils were equal and reacted. Extraocular movements were full and conjugate.  V/VII-no facial numbness; mild right lower facial weakness.  VIII-normal.  X-moderate dysarthria.  Motor: 5/5 bilaterally with normal tone and bulk  Sensory: Slightly reduced perception of tactile sensation over right extremities compared to left extremities.  Deep Tendon Reflexes: 2+ and symmetric.  Plantars: Flexor bilaterally  Cerebellar: Normal finger-to-nose testing.   ASSESSMENT Mr. TILTON MARSALIS is a 77 y.o. male presenting with dysarthria, dysphagia. MR Imaging pending, symptoms secondary to unknown source.  On clopidogrel 75 mg orally every day prior to admission. Now on clopidogrel 75 mg orally every day for secondary stroke prevention. Patient with resultant dysarthria, dysphasia. Work up underway.   Diabetes mellitus, type 2, uncontrolled, HGB A1C 11.5  Hyperlipidemia, LDL 88, goal < 70 for diabetics, continue statin  Illicit drug use, cessation counseling  Hypertension  Hospital day # 1  TREATMENT/PLAN  Change to aspirin 325mg  daily (so as to be crushed, until can swallow whole pill) for secondary stroke prevention.  MRI pending  CIR consulted  Risk factor modification  Await PT/OT evaluations  Gwendolyn Lima. Manson Passey, [facility], MBA, MHA [facility] Pager: [phone] 12/02/2012 2:02 PM  I have personally obtained a history, examined the patient, evaluated imaging results, and formulated the assessment and plan of care. I agree with the above. Delia Heady, MD

## 2012-12-02 NOTE — ED Notes (Signed)
Patient states he feels better, but speech still slurred more than previous assessment. Per admitting MD and EDP patient is ok to travel to inpatient assignment at this time.

## 2012-12-02 NOTE — Consult Note (Signed)
Physical Medicine and Rehabilitation Consult  Reason for Consult: worsening of right sided weakness, slurred speech and difficulty swallowing.  Referring Physician:  Dr. Pearlean Brownie.    HPI: Casey Collins is a 77 y.o. male with history of CVA, DM, HTN, who was admitted on 12/01/12 with three episodes of slurred speech with drooling as well as  right facial weakness and increase in right sided weakness. Symptoms improved in ED and patient out of window for tPA.  CCT without acute infarct but with progression of chronic small vessel disease ( left frontal , left corona radiata and left cerebellar). 2D echo with EF 55-65% with grade 1 diastolic dysfunction. Carotid dopplers with 40-55% L-ICA stenosis. MBS done revealing moderate oro-pharyngeal dysphagia with difficulty clearing secretion therefore placed on D1, honey thick liquids.  Workup ongoing and neurology recommends changing patient to ASA for secondary stroke prevention.    Review of Systems  HENT: Positive for congestion.        Difficulty swallowing, impaired speech   Past Medical History  Diagnosis Date  . Stroke     Right side   . Diabetes mellitus   . Arthritis   . Hypertension   . Hyperlipemia   . Diverticulosis   . Colon polyp    Past Surgical History  Procedure Laterality Date  . Bilateral inguinal hernia     Family History  Problem Relation Age of Onset  . Colon cancer Neg Hx   . Prostate cancer Brother   . Diabetes Daughter    Social History:  reports that he has never smoked. He has never used smokeless tobacco. He reports that he does not drink alcohol or use illicit drugs. Allergies: No Known Allergies Medications Prior to Admission  Medication Sig Dispense Refill  . citalopram (CELEXA) 10 MG tablet Take 10 mg by mouth daily.       . clopidogrel (PLAVIX) 75 MG tablet Take 75 mg by mouth daily with breakfast.       . insulin aspart (NOVOLOG) 100 UNIT/ML injection Inject 5 Units into the skin daily. Sliding scale       . insulin glargine (LANTUS) 100 UNIT/ML injection Inject 10 Units into the skin 2 (two) times daily.       Marland Kitchen LORazepam (ATIVAN) 1 MG tablet Take 1 mg by mouth 2 (two) times daily as needed for anxiety.      . metoprolol tartrate (LOPRESSOR) 25 MG tablet Take 25 mg by mouth 2 (two) times daily.       . ramipril (ALTACE) 5 MG capsule Take 5 mg by mouth daily.       . simvastatin (ZOCOR) 40 MG tablet Take 40 mg by mouth at bedtime.       Marland Kitchen zolpidem (AMBIEN) 10 MG tablet Take 10 mg by mouth at bedtime.         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 rate 18, height 5\' 10"  (1.778 m), weight 90.266 kg (199 lb),  SpO2 96.00%. Physical Exam  Constitutional: He appears well-developed and well-nourished.  HENT:  Head: Normocephalic and atraumatic.  Eyes: Conjunctivae and EOM are normal. Pupils are equal, round, and reactive to light.  Neck: No JVD present. No thyromegaly present.  Cardiovascular: Normal rate and regular rhythm.   Respiratory: Effort normal and breath sounds normal.  GI: Soft.  Neurological:  Speech very dysarthric. Almost unintelligible. Weak cough. Good sitting balance. Has left central 7 and tongue deviation to the left. LUE is grossly 4/5. LLE is 4+ to 5/5. No sensory deficits are appreciated.   Psychiatric:  Flat but fairly appropriate.     Results for orders placed during the hospital encounter of 12/01/12 (from the past 24 hour(s))  GLUCOSE, CAPILLARY     Status: None   Collection Time    12/01/12  6:33 PM      Result Value Range   Glucose-Capillary 79  70 - 99 mg/dL   Comment 1 Documented in Chart     Comment 2 Notify RN    CBC WITH DIFFERENTIAL     Status: None   Collection Time    12/01/12  6:50 PM      Result Value Range   WBC 9.9  4.0 - 10.5 K/uL   RBC 4.49  4.22 - 5.81 MIL/uL   Hemoglobin 14.3  13.0 - 17.0 g/dL   HCT 16.1  09.6 - 04.5 %   MCV 93.3  78.0 - 100.0 fL   MCH 31.8  26.0 - 34.0 pg   MCHC 34.1  30.0 - 36.0 g/dL   RDW 40.9  81.1 - 91.4 %   Platelets 225  150 - 400 K/uL   Neutrophils Relative % 51  43 - 77 %   Neutro Abs 5.1  1.7 - 7.7 K/uL   Lymphocytes Relative 34  12 - 46 %   Lymphs Abs 3.3  0.7 - 4.0 K/uL   Monocytes Relative 10  3 - 12 %   Monocytes Absolute 1.0  0.1 - 1.0 K/uL   Eosinophils Relative 5  0 - 5 %   Eosinophils Absolute 0.5  0.0 - 0.7 K/uL   Basophils Relative 0  0 - 1 %   Basophils Absolute 0.0  0.0 - 0.1 K/uL  TROPONIN I     Status: None   Collection Time    12/01/12  7:03 PM      Result Value Range   Troponin I <0.30  <0.30 ng/mL  POCT I-STAT, CHEM 8     Status: Abnormal   Collection Time    12/01/12  7:05 PM      Result Value Range   Sodium 143  135 - 145 mEq/L   Potassium 5.1  3.5 - 5.1 mEq/L   Chloride 105  96 - 112 mEq/L   BUN 29 (*) 6 - 23 mg/dL   Creatinine, Ser 7.82 (*) 0.50 - 1.35 mg/dL   Glucose, Bld 85  70 - 99 mg/dL   Calcium, Ion 9.56  2.13 - 1.30 mmol/L   TCO2 27  0 - 100 mmol/L   Hemoglobin 15.0  13.0 - 17.0 g/dL   HCT 08.6  57.8 - 46.9 %  GLUCOSE, CAPILLARY     Status: None   Collection Time    12/01/12  7:20 PM      Result Value Range   Glucose-Capillary 82  70 - 99 mg/dL  GLUCOSE, CAPILLARY     Status: Abnormal   Collection Time  12/02/12 12:58 AM      Result Value Range   Glucose-Capillary 113 (*) 70 - 99 mg/dL   Comment 1 Notify  RN    URINALYSIS, ROUTINE W REFLEX MICROSCOPIC     Status: None   Collection Time    12/02/12  1:11 AM      Result Value Range   Color, Urine YELLOW  YELLOW   APPearance CLEAR  CLEAR   Specific Gravity, Urine 1.012  1.005 - 1.030   pH 6.0  5.0 - 8.0   Glucose, UA NEGATIVE  NEGATIVE mg/dL   Hgb urine dipstick NEGATIVE  NEGATIVE   Bilirubin Urine NEGATIVE  NEGATIVE   Ketones, ur NEGATIVE  NEGATIVE mg/dL   Protein, ur NEGATIVE  NEGATIVE mg/dL   Urobilinogen, UA 0.2  0.0 - 1.0 mg/dL   Nitrite NEGATIVE  NEGATIVE   Leukocytes, UA NEGATIVE  NEGATIVE  HEMOGLOBIN A1C     Status: Abnormal   Collection Time    12/02/12  6:38 AM      Result Value Range   Hemoglobin A1C 5.9 (*) <5.7 %   Mean Plasma Glucose 123 (*) <117 mg/dL  LIPID PANEL     Status: Abnormal   Collection Time    12/02/12  6:38 AM      Result Value Range   Cholesterol 140  0 - 200 mg/dL   Triglycerides 89  <147 mg/dL   HDL 34 (*) >82 mg/dL   Total CHOL/HDL Ratio 4.1     VLDL 18  0 - 40 mg/dL   LDL Cholesterol 88  0 - 99 mg/dL  CBC WITH DIFFERENTIAL     Status: Abnormal   Collection Time    12/02/12  6:38 AM      Result Value Range   WBC 7.8  4.0 - 10.5 K/uL   RBC 4.43  4.22 - 5.81 MIL/uL   Hemoglobin 14.0  13.0 - 17.0 g/dL   HCT 95.6  21.3 - 08.6 %   MCV 91.9  78.0 - 100.0 fL   MCH 31.6  26.0 - 34.0 pg   MCHC 34.4  30.0 - 36.0 g/dL   RDW 57.8  46.9 - 62.9 %   Platelets 192  150 - 400 K/uL   Neutrophils Relative % 88 (*) 43 - 77 %   Neutro Abs 6.9  1.7 - 7.7 K/uL   Lymphocytes Relative 11 (*) 12 - 46 %   Lymphs Abs 0.9  0.7 - 4.0 K/uL   Monocytes Relative 1 (*) 3 - 12 %   Monocytes Absolute 0.1  0.1 - 1.0 K/uL   Eosinophils Relative 0  0 - 5 %   Eosinophils Absolute 0.0  0.0 - 0.7 K/uL   Basophils Relative 0  0 - 1 %   Basophils Absolute 0.0  0.0 - 0.1 K/uL  COMPREHENSIVE METABOLIC PANEL     Status: Abnormal   Collection Time    12/02/12  6:38 AM      Result Value Range   Sodium 139  135 - 145 mEq/L    Potassium 4.5  3.5 - 5.1 mEq/L   Chloride 106  96 - 112 mEq/L   CO2 25  19 - 32 mEq/L   Glucose, Bld 178 (*) 70 - 99 mg/dL   BUN 23  6 - 23 mg/dL   Creatinine, Ser 5.28  0.50 - 1.35 mg/dL   Calcium 9.2  8.4 - 41.3 mg/dL   Total Protein 6.7  6.0 - 8.3  g/dL   Albumin 3.8  3.5 - 5.2 g/dL   AST 21  0 - 37 U/L   ALT 15  0 - 53 U/L   Alkaline Phosphatase 64  39 - 117 U/L   Total Bilirubin 0.4  0.3 - 1.2 mg/dL   GFR calc non Af Amer 55 (*) >90 mL/min   GFR calc Af Amer 64 (*) >90 mL/min  GLUCOSE, CAPILLARY     Status: Abnormal   Collection Time    12/02/12  6:41 AM      Result Value Range   Glucose-Capillary 170 (*) 70 - 99 mg/dL   Comment 1 Notify RN    GLUCOSE, CAPILLARY     Status: Abnormal   Collection Time    12/02/12  9:51 AM      Result Value Range   Glucose-Capillary 205 (*) 70 - 99 mg/dL  GLUCOSE, CAPILLARY     Status: Abnormal   Collection Time    12/02/12 12:01 PM      Result Value Range   Glucose-Capillary 190 (*) 70 - 99 mg/dL   Comment 1 Notify RN     Ct Head Wo Contrast  12/01/2012   CLINICAL DATA:  77 year old male code stroke. Slurred speech and difficulty swallowing. Initial encounter.  EXAM: CT HEAD WITHOUT CONTRAST  TECHNIQUE: Contiguous axial images were obtained from the base of the skull through the vertex without intravenous contrast.  COMPARISON:  01/04/2009 and earlier.  FINDINGS: Increased paranasal sinus mucosal thickening and opacification. Mastoids and tympanic cavities remain clear. No acute osseous abnormality identified. Visualized orbits and scalp soft tissues are within normal limits.  Calcified atherosclerosis at the skull base. Interval evolution of the left coronal radiata lacunar infarct which was subacute on the most recent comparison. Chronic lacunar infarcts in the bilateral deep gray matter nuclei and subcortical white matter. New focus of involvement in the left frontal lobe on series 2, image 26, but appears chronic. Small left inferior  cerebellar lacunar infarcts also are new since 2010.  No acute intracranial hemorrhage identified. No midline shift, mass effect, or evidence of intracranial mass lesion. No ventriculomegaly. No evidence of cortically based acute infarction identified. No suspicious intracranial vascular hyperdensity.  IMPRESSION: Chronic small vessel ischemia, with progression of disease since 2010. No acute cortically based infarct or acute intracranial hemorrhage.  Study discussed by telephone with Dr. Amada Jupiter On 12/01/2012 at 19:08 .   Electronically Signed   By: Augusto Gamble M.D.   On: 12/01/2012 19:09   Dg Swallowing Func-speech Pathology  12/02/2012   Chales Abrahams, CCC-SLP     12/02/2012 10:01 AM Objective Swallowing Evaluation: Modified Barium Swallowing Study   Patient Details  Name: Casey Collins MRN: [id number] Date of Birth: 12-01-1935  Today's Date: 12/02/2012 Time: 0905-0925 SLP Time Calculation (min): 20 min  Past Medical History:  Past Medical History  Diagnosis Date  . Stroke     Right side   . Diabetes mellitus   . Arthritis   . Hypertension   . Hyperlipemia   . Diverticulosis   . Colon polyp    Past Surgical History:  Past Surgical History  Procedure Laterality Date  . Bilateral inguinal hernia     HPI:  77 yo male adm to [facility] with slurred speech.  CT head negative for  acute event.  PMH + Belll's Palsy resulting in right facial  weakness (upper and lower), CVA Nov 2010 impairing speech and  with Rt HP.  Pt speaking better  at home per family and was  starting to walk.       Assessment / Plan / Recommendation Clinical Impression  Dysphagia Diagnosis: Moderate oral phase dysphagia;Moderate  pharyngeal phase dysphagia  Clinical impression: Moderate oropharyngeal dysphagia  characterized by decreased oral bolus cohesion/control due to  hypoglossal and glossopharyngeal involvement resulting in  premature spillage into pharynx. Pharyngeal swallow was strong  albeit delayed to pyriform sinus and vallecular space  resulting  in + audible aspiration of thin liquids - even with chin tuck  posture.  Unfortunately cough is weak and nonprotective and  aspiration appears to cause mild distress.   Chin tuck posture  effectively protects airway with pudding, honey and even nectar.   However given severity of weakness of cough and distress with  aspiration, recommend to start with honey thick liquids.  Skilled  intervention included education of pt, family to precautions and  reinforcement of strategies.    Pt noted to have secretions retained in airway that he was unable  to clear- therefore recommend oral care after meals.    As pt has been coughing some with intake at home PTA, suspect  component of chronic aspiration - now with worsened significantly  by this event.      Treatment Recommendation  Therapy as outlined in treatment plan below    Diet Recommendation Honey-thick liquid;Dysphagia 1 (Puree)   Liquid Administration via: Cup Medication Administration: Crushed with puree Supervision: Patient able to self feed;Full supervision/cueing  for compensatory strategies Compensations: Slow rate;Small sips/bites;Check for pocketing Postural Changes and/or Swallow Maneuvers: Seated upright 90  degrees;Chin tuck    Other  Recommendations Recommended Consults: MBS Oral Care Recommendations:  (oral care after meals) Other Recommendations: Order thickener from pharmacy   Follow Up Recommendations  Inpatient Rehab    Frequency and Duration min 2x/week  2 weeks   Pertinent Vitals/Pain Afebrile, decreased    SLP Swallow Goals     General Date of Onset: 12/02/12 HPI: 77 yo male adm to [facility] with slurred speech.  CT head negative  for acute event.  PMH + Belll's Palsy resulting in right facial  weakness (upper and lower), CVA Nov 2010 impairing speech and  with Rt HP.  Pt speaking better at home per family and was  starting to walk.   Type of Study: Modified Barium Swallowing Study Previous Swallow Assessment: bedside eval in 2010 per family, no   info found in epic Diet Prior to this Study: NPO Temperature Spikes Noted: No Respiratory Status: Room air History of Recent Intubation: No Behavior/Cognition: Alert;Cooperative;Pleasant mood Oral Cavity - Dentition:  (four upper teeth, lower dentition  intact, pt has no dentures/partial) Oral Motor / Sensory Function: Impaired - see Bedside swallow  eval Self-Feeding Abilities: Able to feed self Patient Positioning: Upright in chair Baseline Vocal Quality: Breathy;Low vocal intensity Volitional Cough: Weak Volitional Swallow: Able to elicit Anatomy: Within functional limits Pharyngeal Secretions: Standing secretions in (comment)  (secretions mix with barium, + aspiration of secretions during  MBS)    Reason for Referral   concern for aspiration after suspected  neuro event  Oral Phase Oral Preparation/Oral Phase Oral Phase: Impaired Oral - Honey Oral - Honey Teaspoon: Delayed oral transit;Weak lingual  manipulation;Reduced posterior propulsion Oral - Nectar Oral - Nectar Teaspoon: Reduced posterior propulsion;Weak lingual  manipulation;Delayed oral transit Oral - Nectar Cup: Reduced posterior propulsion;Weak lingual  manipulation;Delayed oral transit Oral - Nectar Straw: Reduced posterior propulsion;Weak lingual  manipulation;Delayed oral transit Oral - Thin Oral - Thin  Cup: Weak lingual manipulation;Reduced posterior  propulsion;Delayed oral transit Oral - Solids Oral - Puree: Reduced posterior propulsion;Weak lingual  manipulation;Delayed oral transit Oral - Regular: Reduced posterior propulsion;Weak lingual  manipulation;Delayed oral transit;Impaired mastication Oral Phase - Comment Oral Phase - Comment: chin tuck posture effective for airway  protection with nectar, honey, pudding and cracker - still with +  overt audible asp of thin with chin tuck   Pharyngeal Phase Pharyngeal Phase Pharyngeal Phase: Impaired Pharyngeal - Honey Pharyngeal - Honey Teaspoon: Delayed swallow initiation;Premature  spillage to  pyriform sinuses;Premature spillage to valleculae Pharyngeal - Nectar Pharyngeal - Nectar Teaspoon: Delayed swallow  initiation;Premature spillage to valleculae;Premature spillage to  pyriform sinuses Pharyngeal - Nectar Cup: Delayed swallow initiation;Premature  spillage to pyriform sinuses;Premature spillage to  valleculae;Penetration/Aspiration during swallow Penetration/Aspiration details (nectar cup): Material enters  airway, remains ABOVE vocal cords then ejected out Pharyngeal - Nectar Straw: Delayed swallow initiation;Premature  spillage to valleculae;Premature spillage to pyriform sinuses Pharyngeal - Thin Pharyngeal - Thin Cup: Significant aspiration  (Amount);Penetration/Aspiration during swallow;Delayed swallow  initiation;Premature spillage to valleculae;Premature spillage to  pyriform sinuses Penetration/Aspiration details (thin cup): Material enters  airway, passes BELOW cords and not ejected out despite cough  attempt by patient Pharyngeal - Solids Pharyngeal - Puree: Penetration/Aspiration during  swallow;Premature spillage to valleculae;Premature spillage to  pyriform sinuses Penetration/Aspiration details (puree): Material enters airway,  remains ABOVE vocal cords then ejected out Pharyngeal - Regular: Delayed swallow initiation;Premature  spillage to valleculae Pharyngeal Phase - Comment Pharyngeal Comment: chin tuck posture effective for airway  protection with nectar, honey, pudding and cracker - still with +  overt audible asp of thin with chin tuck  Cervical Esophageal Phase    GO    Cervical Esophageal Phase Cervical Esophageal Phase: WFL (esophagus appeared clear upon  sweep x2)         Donavan Burnet, MS [facility] SLP [phone]     Assessment/Plan: Diagnosis: acute right subcortical infarct with primarily swallowing/speech deficits.  1. Does the need for close, 24 hr/day medical supervision in concert with the patient's rehab needs make it unreasonable for this patient to be served in a less  intensive setting? No and Potentially 2. Co-Morbidities requiring supervision/potential complications: htn, hx of cva 3. Due to bladder management, bowel management, safety, skin/wound care, disease management, medication administration and pain management, does the patient require 24 hr/day rehab nursing? Potentially 4. Does the patient require coordinated care of a physician, rehab nurse, PT (1-2 hrs/day, 5 days/week), OT (1-2 hrs/day, 5 days/week) and SLP (1-2 hrs/day, 5 days/week) to address physical and functional deficits in the context of the above medical diagnosis(es)? Potentially Addressing deficits in the following areas: balance, endurance, locomotion, strength, transferring, bowel/bladder control, bathing, dressing, feeding, grooming, toileting, speech, swallowing and psychosocial support 5. Can the patient actively participate in an intensive therapy program of at least 3 hrs of therapy per day at least 5 days per week? Potentially 6. The potential for patient to make measurable gains while on inpatient rehab is fair 7. Anticipated functional outcomes upon discharge from inpatient rehab are ?mod I with PT, ? Mod I with OT, min to mod assist with SLP. 8. Estimated rehab length of stay to reach the above functional goals is: TBD 9. Does the patient have adequate social supports to accommodate these discharge functional goals? Yes 10. Anticipated D/C setting: Home 11. Anticipated post D/C treatments: HH therapy vs outpt therapies 12. Overall Rehab/Functional Prognosis: good  RECOMMENDATIONS: This patient's condition is appropriate for continued  rehabilitative care in the following setting: Outpt SLP, ?PT/OT Patient has agreed to participate in recommended program. Potentially Note that insurance prior authorization may be required for reimbursement for recommended care.  Comment: Pt was admitted yesterday. His limb abnormalities are minimal. Issues primarily pertain to speech and  swallowing. Probably doesn't require enough PT or OT to justify an inpatient rehab admission. Rehab RN to follow up.   Ranelle Oyster, MD, [place] [facility]     12/02/2012

## 2012-12-02 NOTE — Progress Notes (Signed)
VASCULAR LAB PRELIMINARY  PRELIMINARY  PRELIMINARY  PRELIMINARY  Carotid duplex  completed.    Preliminary report:  Right:  1-39% ICA stenosis.  Left:  40-59% ICA stenosis.  Vertebral artery flow is antegrade.      Kealii Thueson, RVT 12/02/2012, 12:10 PM

## 2012-12-02 NOTE — Plan of Care (Signed)
Problem: Acute Treatment Outcomes Goal: Prognosis discussed with family/patient as appropriate Outcome: Progressing Daughter at bedside.

## 2012-12-02 NOTE — Progress Notes (Signed)
[facility]                                                                                Patient Demographics  Casey Collins, is a 77 y.o. male, DOB - 03-23-1935, ZOX:[id number]  Admit date - 12/01/2012   Admitting Physician Lynden Oxford, MD  Outpatient Primary MD for the patient is Minda Meo, MD  LOS - 1   Chief Complaint  Patient presents with  . Aphasia        Assessment & Plan  Principal Problem:   CVA (cerebral infarction) Active Problems:   HTN (hypertension)   DM (dermatomyositis)   Osteoarthritis  CVA (cerebral infarction)  -Improving speech aphasia and right sided weakness -Patient was not TPA candidate due to time -Speech therapy, PT, and OT consulted -Neurology following -Pending MRI/MRA, 2D echocardiogram, and carotid doppler  -Continue plavix and simvastatin  Difficulty swallowing -Lump sensation in throat -Speech to evaluate swallowing, NPO for now -Will continue to monitor, will order ultrasound of the neck  Hypertension  -Will restart home medications of metoprolol once evaluated by speech  Diabetes mellitus  -Continue sliding scale.  Acute Kidney Injury -Cr at time of admission was 1.6. Currently 1.24 -Will continue to monitor -Holding ramipril.  Code Status: Full  Family Communication: daughter at bedside  Disposition Plan: Admitted, likely will go home with home health as patient's daughter daughter stated that the patient would not want to go to rehab.  Procedures None  Consults   Neurology  DVT Prophylaxis  Heparin  Lab Results  Component Value Date   PLT 192 12/02/2012    Medications  Scheduled Meds: . clopidogrel  75 mg Oral Q breakfast  . heparin  5,000 Units Subcutaneous Q8H  . insulin aspart  0-9 Units Subcutaneous Q6H  . simvastatin  40 mg Oral QHS   Continuous Infusions: . sodium chloride 100 mL/hr at 12/01/12 1929   PRN Meds:.RESOURCE THICKENUP CLEAR, senna-docusate  Antibiotics     Anti-infectives   None       Time Spent in minutes   30 minutes   Kenya Shiraishi D.O. on 12/02/2012 at 10:46 AM  Between 7am to 7pm - Pager - [phone]  After 7pm go to [email or web address] - password [email or web address]  And look for the night coverage person covering for me after hours  [facility] Office  [phone]    Subjective:   Casey Collins seen and examined today.  Patient states he is feeling better. He feels that his speech is improving from yesterday. He no longer feels the weakness in his right side. Patient does complain of lump in throat. Patient denies dizziness, chest pain, shortness of breath, abdominal pain, N/V/D/C, new weakness, numbess, tingling.    Objective:   Filed Vitals:   12/01/12 2236 12/02/12 0054 12/02/12 0312 12/02/12 0611  BP: 139/116 180/89 177/91 170/87  Pulse: 69 66 62 57  Temp: 98.6 F (37 C) 98.2 F (36.8 C) 97.9 F (36.6 C) 99.2 F (37.3 C)  TempSrc: Oral Oral Oral Oral  Resp: 20 20 20 18   Height:      Weight:      SpO2: 97% 96% 95% 95%  Wt Readings from Last 3 Encounters:  12/01/12 90.266 kg (199 lb)  10/18/11 93.441 kg (206 lb)  07/19/11 96.163 kg (212 lb)    No intake or output data in the 24 hours ending 12/02/12 1046  Exam  General: Well developed, well nourished, NAD, appears stated age  HEENT: NCAT, PERRLA, EOMI, Anicteic Sclera, mucous membranes moist. No pharyngeal erythema or exudates  Neck: Supple, no JVD, Nodular density palpated   Cardiovascular: S1 S2 auscultated, 2/6 SEM, Regular rate and rhythm.  Respiratory: Clear to auscultation bilaterally with equal chest rise  Abdomen: Soft, nontender, nondistended, + bowel sounds  Extremities: warm dry without cyanosis clubbing or edema  Neuro: AAOx3, cranial nerves grossly intact. Strength 5/5 in lower and upper extremities  Skin: Without rashes exudates or nodules  Psych: Normal affect and demeanor   Data Review   Micro Results No results  found for this or any previous visit (from the past 240 hour(s)).  Radiology Reports Ct Head Wo Contrast  12/01/2012   CLINICAL DATA:  77 year old male code stroke. Slurred speech and difficulty swallowing. Initial encounter.  EXAM: CT HEAD WITHOUT CONTRAST  TECHNIQUE: Contiguous axial images were obtained from the base of the skull through the vertex without intravenous contrast.  COMPARISON:  01/04/2009 and earlier.  FINDINGS: Increased paranasal sinus mucosal thickening and opacification. Mastoids and tympanic cavities remain clear. No acute osseous abnormality identified. Visualized orbits and scalp soft tissues are within normal limits.  Calcified atherosclerosis at the skull base. Interval evolution of the left coronal radiata lacunar infarct which was subacute on the most recent comparison. Chronic lacunar infarcts in the bilateral deep gray matter nuclei and subcortical white matter. New focus of involvement in the left frontal lobe on series 2, image 26, but appears chronic. Small left inferior cerebellar lacunar infarcts also are new since 2010.  No acute intracranial hemorrhage identified. No midline shift, mass effect, or evidence of intracranial mass lesion. No ventriculomegaly. No evidence of cortically based acute infarction identified. No suspicious intracranial vascular hyperdensity.  IMPRESSION: Chronic small vessel ischemia, with progression of disease since 2010. No acute cortically based infarct or acute intracranial hemorrhage.  Study discussed by telephone with Dr. Amada Jupiter On 12/01/2012 at 19:08 .   Electronically Signed   By: Augusto Gamble M.D.   On: 12/01/2012 19:09   Dg Swallowing Func-speech Pathology  12/02/2012   Chales Abrahams, CCC-SLP     12/02/2012 10:01 AM Objective Swallowing Evaluation: Modified Barium Swallowing Study   Patient Details  Name: Casey Collins MRN: [id number] Date of Birth: 07-22-1935  Today's Date: 12/02/2012 Time: 0905-0925 SLP Time Calculation (min): 20  min  Past Medical History:  Past Medical History  Diagnosis Date  . Stroke     Right side   . Diabetes mellitus   . Arthritis   . Hypertension   . Hyperlipemia   . Diverticulosis   . Colon polyp    Past Surgical History:  Past Surgical History  Procedure Laterality Date  . Bilateral inguinal hernia     HPI:  77 yo male adm to [facility] with slurred speech.  CT head negative for  acute event.  PMH + Belll's Palsy resulting in right facial  weakness (upper and lower), CVA Nov 2010 impairing speech and  with Rt HP.  Pt speaking better at home per family and was  starting to walk.       Assessment / Plan / Recommendation Clinical Impression  Dysphagia Diagnosis: Moderate oral phase dysphagia;Moderate  pharyngeal phase dysphagia  Clinical impression: Moderate oropharyngeal dysphagia  characterized by decreased oral bolus cohesion/control due to  hypoglossal and glossopharyngeal involvement resulting in  premature spillage into pharynx. Pharyngeal swallow was strong  albeit delayed to pyriform sinus and vallecular space resulting  in + audible aspiration of thin liquids - even with chin tuck  posture.  Unfortunately cough is weak and nonprotective and  aspiration appears to cause mild distress.   Chin tuck posture  effectively protects airway with pudding, honey and even nectar.   However given severity of weakness of cough and distress with  aspiration, recommend to start with honey thick liquids.  Skilled  intervention included education of pt, family to precautions and  reinforcement of strategies.    Pt noted to have secretions retained in airway that he was unable  to clear- therefore recommend oral care after meals.    As pt has been coughing some with intake at home PTA, suspect  component of chronic aspiration - now with worsened significantly  by this event.      Treatment Recommendation  Therapy as outlined in treatment plan below    Diet Recommendation Honey-thick liquid;Dysphagia 1 (Puree)   Liquid Administration  via: Cup Medication Administration: Crushed with puree Supervision: Patient able to self feed;Full supervision/cueing  for compensatory strategies Compensations: Slow rate;Small sips/bites;Check for pocketing Postural Changes and/or Swallow Maneuvers: Seated upright 90  degrees;Chin tuck    Other  Recommendations Recommended Consults: MBS Oral Care Recommendations:  (oral care after meals) Other Recommendations: Order thickener from pharmacy   Follow Up Recommendations  Inpatient Rehab    Frequency and Duration min 2x/week  2 weeks   Pertinent Vitals/Pain Afebrile, decreased    SLP Swallow Goals     General Date of Onset: 12/02/12 HPI: 77 yo male adm to [facility] with slurred speech.  CT head negative  for acute event.  PMH + Belll's Palsy resulting in right facial  weakness (upper and lower), CVA Nov 2010 impairing speech and  with Rt HP.  Pt speaking better at home per family and was  starting to walk.   Type of Study: Modified Barium Swallowing Study Previous Swallow Assessment: bedside eval in 2010 per family, no  info found in epic Diet Prior to this Study: NPO Temperature Spikes Noted: No Respiratory Status: Room air History of Recent Intubation: No Behavior/Cognition: Alert;Cooperative;Pleasant mood Oral Cavity - Dentition:  (four upper teeth, lower dentition  intact, pt has no dentures/partial) Oral Motor / Sensory Function: Impaired - see Bedside swallow  eval Self-Feeding Abilities: Able to feed self Patient Positioning: Upright in chair Baseline Vocal Quality: Breathy;Low vocal intensity Volitional Cough: Weak Volitional Swallow: Able to elicit Anatomy: Within functional limits Pharyngeal Secretions: Standing secretions in (comment)  (secretions mix with barium, + aspiration of secretions during  MBS)    Reason for Referral   concern for aspiration after suspected  neuro event  Oral Phase Oral Preparation/Oral Phase Oral Phase: Impaired Oral - Honey Oral - Honey Teaspoon: Delayed oral transit;Weak lingual   manipulation;Reduced posterior propulsion Oral - Nectar Oral - Nectar Teaspoon: Reduced posterior propulsion;Weak lingual  manipulation;Delayed oral transit Oral - Nectar Cup: Reduced posterior propulsion;Weak lingual  manipulation;Delayed oral transit Oral - Nectar Straw: Reduced posterior propulsion;Weak lingual  manipulation;Delayed oral transit Oral - Thin Oral - Thin Cup: Weak lingual manipulation;Reduced posterior  propulsion;Delayed oral transit Oral - Solids Oral - Puree: Reduced posterior propulsion;Weak lingual  manipulation;Delayed oral transit Oral - Regular: Reduced posterior propulsion;Weak lingual  manipulation;Delayed oral transit;Impaired mastication Oral Phase - Comment Oral Phase - Comment: chin tuck posture effective for airway  protection with nectar, honey, pudding and cracker - still with +  overt audible asp of thin with chin tuck   Pharyngeal Phase Pharyngeal Phase Pharyngeal Phase: Impaired Pharyngeal - Honey Pharyngeal - Honey Teaspoon: Delayed swallow initiation;Premature  spillage to pyriform sinuses;Premature spillage to valleculae Pharyngeal - Nectar Pharyngeal - Nectar Teaspoon: Delayed swallow  initiation;Premature spillage to valleculae;Premature spillage to  pyriform sinuses Pharyngeal - Nectar Cup: Delayed swallow initiation;Premature  spillage to pyriform sinuses;Premature spillage to  valleculae;Penetration/Aspiration during swallow Penetration/Aspiration details (nectar cup): Material enters  airway, remains ABOVE vocal cords then ejected out Pharyngeal - Nectar Straw: Delayed swallow initiation;Premature  spillage to valleculae;Premature spillage to pyriform sinuses Pharyngeal - Thin Pharyngeal - Thin Cup: Significant aspiration  (Amount);Penetration/Aspiration during swallow;Delayed swallow  initiation;Premature spillage to valleculae;Premature spillage to  pyriform sinuses Penetration/Aspiration details (thin cup): Material enters  airway, passes BELOW cords and not ejected  out despite cough  attempt by patient Pharyngeal - Solids Pharyngeal - Puree: Penetration/Aspiration during  swallow;Premature spillage to valleculae;Premature spillage to  pyriform sinuses Penetration/Aspiration details (puree): Material enters airway,  remains ABOVE vocal cords then ejected out Pharyngeal - Regular: Delayed swallow initiation;Premature  spillage to valleculae Pharyngeal Phase - Comment Pharyngeal Comment: chin tuck posture effective for airway  protection with nectar, honey, pudding and cracker - still with +  overt audible asp of thin with chin tuck  Cervical Esophageal Phase    GO    Cervical Esophageal Phase Cervical Esophageal Phase: WFL (esophagus appeared clear upon  sweep x2)         Donavan Burnet, MS [facility] SLP [phone]     CBC  Recent Labs Lab 12/01/12 1850 12/01/12 1905 12/02/12 0638  WBC 9.9  --  7.8  HGB 14.3 15.0 14.0  HCT 41.9 44.0 40.7  PLT 225  --  192  MCV 93.3  --  91.9  MCH 31.8  --  31.6  MCHC 34.1  --  34.4  RDW 13.3  --  13.3  LYMPHSABS 3.3  --  0.9  MONOABS 1.0  --  0.1  EOSABS 0.5  --  0.0  BASOSABS 0.0  --  0.0    Chemistries   Recent Labs Lab 12/01/12 1905 12/02/12 0638  NA 143 139  K 5.1 4.5  CL 105 106  CO2  --  25  GLUCOSE 85 178*  BUN 29* 23  CREATININE 1.60* 1.24  CALCIUM  --  9.2  AST  --  21  ALT  --  15  ALKPHOS  --  64  BILITOT  --  0.4   ------------------------------------------------------------------------------------------------------------------ estimated creatinine clearance is 58.2 ml/min (by C-G formula based on Cr of 1.24). ------------------------------------------------------------------------------------------------------------------ No results found for this basename: HGBA1C,  in the last 72 hours ------------------------------------------------------------------------------------------------------------------  Recent Labs  12/02/12 0638  CHOL 140  HDL 34*  LDLCALC 88  TRIG 89  CHOLHDL 4.1    ------------------------------------------------------------------------------------------------------------------ No results found for this basename: TSH, T4TOTAL, FREET3, T3FREE, THYROIDAB,  in the last 72 hours ------------------------------------------------------------------------------------------------------------------ No results found for this basename: VITAMINB12, FOLATE, FERRITIN, TIBC, IRON, RETICCTPCT,  in the last 72 hours  Coagulation profile No results found for this basename: INR, PROTIME,  in the last 168 hours  No results found for this basename: DDIMER,  in the last 72 hours  Cardiac Enzymes  Recent Labs Lab 12/01/12 1903  TROPONINI <0.30   ------------------------------------------------------------------------------------------------------------------  No components found with this basename: POCBNP,

## 2012-12-02 NOTE — Progress Notes (Signed)
  Echocardiogram 2D Echocardiogram has been performed.  [name] 12/02/2012, 11:54 AM

## 2012-12-02 NOTE — Progress Notes (Signed)
UR COMPLETED  

## 2012-12-02 NOTE — Evaluation (Addendum)
Clinical/Bedside Swallow Evaluation Patient Details  Name: Casey Collins MRN: [id number] Date of Birth: 08-26-1935  Today's Date: 12/02/2012 Time: 0805-0830 SLP Time Calculation (min): 25 min  Past Medical History:  Past Medical History  Diagnosis Date  . Stroke     Right side   . Diabetes mellitus   . Arthritis   . Hypertension   . Hyperlipemia   . Diverticulosis   . Colon polyp    Past Surgical History:  Past Surgical History  Procedure Laterality Date  . Bilateral inguinal hernia     HPI:  77 yo male adm to [facility] with slurred speech.  CT head negative for acute event.  PMH + Belll's Palsy resulting in right facial weakness (upper and lower), CVA Nov 2010 impairing speech and resulting    Assessment / Plan / Recommendation Clinical Impression  Pt presents with moderately severe oral and suspected severe pharyngeal dysphagia.   Pt is severely dysarthric but compensates well- ? brain stem involvement.  Suspect overt aspiration of nectar thick liquids characterized by overt strong cough with mild distress.  Pt's cough and voice is severely weak - concerning for vagal nerve involvement.  Palatal deviation to left observed with phonation. Family present and reports h/o dysphagia - coughing with intake.  MBS indicated to determine if any intake is appropriate.  Order received.     Aspiration Risk  Severe    Diet Recommendation NPO        Other  Recommendations Recommended Consults: MBS   Follow Up Recommendations    ? CIR   Frequency and Duration   TBD after MBS     Pertinent Vitals/Pain Afebrile, decreased    SLP Swallow Goals     Swallow Study Prior Functional Status   h/o some coughing with intake    General Date of Onset: 12/02/12 HPI: 77 yo male adm to [facility] with slurred speech.  CT head negative for acute event.  PMH + Belll's Palsy resulting in right facial weakness (upper and lower), CVA Nov 2010 impairing speech and resulting  Type of Study: Bedside  swallow evaluation Previous Swallow Assessment: bedside eval in 2010  Diet Prior to this Study: NPO Temperature Spikes Noted: No Respiratory Status: Room air History of Recent Intubation: No Behavior/Cognition: Alert;Cooperative;Pleasant mood Oral Cavity - Dentition: Poor condition;Adequate natural dentition (upper four teeth only) Self-Feeding Abilities: Able to feed self Patient Positioning: Upright in bed Baseline Vocal Quality: Low vocal intensity;Breathy Volitional Cough: Weak Volitional Swallow: Able to elicit    Oral/Motor/Sensory Function Overall Oral Motor/Sensory Function: Impaired at baseline (h/o bell's palsy) Labial ROM: Reduced right Labial Symmetry: Abnormal symmetry right Labial Strength: Reduced Labial Sensation: Reduced Lingual ROM: Reduced right Lingual Strength: Reduced Lingual Sensation: Reduced Facial ROM: Reduced right Facial Symmetry: Right droop;Right drooping eyelid Facial Strength: Reduced Facial Sensation: Reduced Velum:  (deviates left) Mandible: Impaired   Ice Chips Ice chips: Not tested   Thin Liquid Thin Liquid: Not tested    Nectar Thick Nectar Thick Liquid: Impaired Presentation: Cup;Self Fed Oral Phase Impairments: Reduced lingual movement/coordination Oral phase functional implications: Prolonged oral transit Pharyngeal Phase Impairments: Suspected delayed Swallow;Wet Vocal Quality;Cough - Delayed;Multiple swallows;Decreased hyoid-laryngeal movement Other Comments: excessive cough with distress   Honey Thick Honey Thick Liquid: Not tested   Puree Puree: Not tested   Solid   GO    Solid: Not tested       Donavan Burnet, MS [facility] SLP [phone]

## 2012-12-02 NOTE — Evaluation (Signed)
Speech Language Pathology Evaluation Patient Details Name: Casey Collins MRN: [id number] DOB: [birth year] Today's Date: 12/02/2012 Time: 7829-5621 SLP Time Calculation (min): 10 min  Problem List:  Patient Active Problem List   Diagnosis Date Noted  . CVA (cerebral infarction) 12/01/2012  . H/O: CVA (cerebrovascular accident) 10/18/2011  . Chronic anticoagulation 10/18/2011  . Diverticulosis 10/18/2011  . Hx of adenomatous colonic polyps 10/18/2011  . Internal hemorrhoids 10/18/2011  . HTN (hypertension) 10/18/2011  . DM (dermatomyositis) 10/18/2011  . Osteoarthritis 10/18/2011   Past Medical History:  Past Medical History  Diagnosis Date  . Stroke     Right side   . Diabetes mellitus   . Arthritis   . Hypertension   . Hyperlipemia   . Diverticulosis   . Colon polyp    Past Surgical History:  Past Surgical History  Procedure Laterality Date  . Bilateral inguinal hernia     HPI:  77 yo male adm to [facility] with slurred speech.  CT head negative for acute event.  PMH + Belll's Palsy resulting in right facial weakness (upper and lower), CVA Nov 2010 impairing speech and with Rt HP.  Pt speaking better at home per family and was starting to walk.     Assessment / Plan / Recommendation Clinical Impression  Pt presents with severe dysarthria with deficits in areas of articulation, phonation/respiratory strength- ? consistent with brainstem involvement.  He compensates by increasing effort and states he's "had a lot of practice" due to premorbid dysarthria.  Pt communication at phrase level with context cues is approx 75% comprehensible.  Cognition appears largely intact but higher level areas may be impacted and warrant further testing.  Rec skilled SLP to maximize pt's functional communication/articulation skills to decrease caregiver burden and improve pt QOL.      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, carpet layer)   Cognition  Arousal/Alertness: Awake/alert Orientation Level: Oriented X4 Attention: Sustained Sustained Attention: Appears intact Memory: Appears intact Awareness: Appears intact Problem Solving: Appears intact Safety/Judgment: Appears intact    Comprehension  Auditory Comprehension Overall Auditory Comprehension: Appears within functional limits for tasks assessed Yes/No Questions: Not tested Commands: Within Functional Limits Conversation: Complex (able to address swallowing issue) Reading Comprehension Reading Status: Not tested    Expression Verbal Expression Overall Verbal Expression: Appears within functional limits for tasks assessed Initiation: No impairment Level of Generative/Spontaneous Verbalization: Conversation Repetition: No impairment Naming: Not tested Pragmatics: No impairment Interfering Components: Speech intelligibility Written Expression Dominant Hand: Right Written Expression: Not tested   Oral / Motor Oral Motor/Sensory Function Overall Oral Motor/Sensory Function: Impaired at baseline (h/o bell's palsy) Labial ROM: Reduced right Labial Symmetry: Abnormal symmetry right Labial Strength: Reduced Labial Sensation: Reduced Lingual ROM: Reduced right Lingual Strength: Reduced Lingual Sensation: Reduced Facial ROM: Reduced right Facial Symmetry: Right droop;Right drooping eyelid Facial Strength: Reduced Facial Sensation: Reduced Velum:  (deviates left) Mandible: Impaired Motor Speech Overall Motor Speech: Impaired Respiration: Impaired Level of Impairment:  Word Phonation: Low vocal intensity;Breathy Resonance: Hyponasality Articulation: Impaired Level of Impairment: Word Intelligibility: Intelligibility reduced Word: 50-74% accurate Phrase: 50-74%  accurate Sentence: 25-49% accurate Conversation: Not tested Motor Planning: Witnin functional limits Effective Techniques: Slow rate;Over-articulate   GO     Mills Koller, MS [facility] SLP [phone]

## 2012-12-03 ENCOUNTER — Inpatient Hospital Stay (HOSPITAL_COMMUNITY): Payer: Medicare Other

## 2012-12-03 DIAGNOSIS — I6350 Cerebral infarction due to unspecified occlusion or stenosis of unspecified cerebral artery: Secondary | ICD-10-CM | POA: Diagnosis not present

## 2012-12-03 DIAGNOSIS — R1310 Dysphagia, unspecified: Secondary | ICD-10-CM | POA: Diagnosis not present

## 2012-12-03 DIAGNOSIS — I10 Essential (primary) hypertension: Secondary | ICD-10-CM | POA: Diagnosis not present

## 2012-12-03 DIAGNOSIS — N179 Acute kidney failure, unspecified: Secondary | ICD-10-CM | POA: Diagnosis not present

## 2012-12-03 DIAGNOSIS — I672 Cerebral atherosclerosis: Secondary | ICD-10-CM | POA: Diagnosis not present

## 2012-12-03 DIAGNOSIS — I6529 Occlusion and stenosis of unspecified carotid artery: Secondary | ICD-10-CM | POA: Diagnosis not present

## 2012-12-03 LAB — GLUCOSE, CAPILLARY
Glucose-Capillary: 163 mg/dL — ABNORMAL HIGH (ref 70–99)
Glucose-Capillary: 206 mg/dL — ABNORMAL HIGH (ref 70–99)
Glucose-Capillary: 217 mg/dL — ABNORMAL HIGH (ref 70–99)
Glucose-Capillary: 98 mg/dL (ref 70–99)

## 2012-12-03 NOTE — Progress Notes (Signed)
Noted PT eval. Recommend HH. I will sign off. [phone]

## 2012-12-03 NOTE — Evaluation (Signed)
Occupational Therapy Evaluation Patient Details Name: Casey Collins MRN: [id number] DOB: 1935-09-01 Today's Date: 12/03/2012 Time: 4098-1191 OT Time Calculation (min): 19 min  OT Assessment / Plan / Recommendation History of present illness Pt presents with speech deficits and facial drooping.     Clinical Impression   Pt admitted w/ dx as above. Both pt & family feel as though pt is at baseline level related to ADL tasks & functional transfers since previous CVA. They state no further needs from OT at this time, as pt has DME & 24/7 supervision/assist. Will sign off acute OT at this time.    OT Assessment  Patient does not need any further OT services    Follow Up Recommendations  Supervision/Assistance - 24 hour    Barriers to Discharge      Equipment Recommendations  None recommended by OT    Recommendations for Other Services    Frequency       Precautions / Restrictions Precautions Precautions: Fall Restrictions Weight Bearing Restrictions: No   Pertinent Vitals/Pain No c/o pain. C/o difficulty w/ speech, aphasia.    ADL  Grooming: Performed;Wash/dry hands;Wash/dry face;Supervision/safety (Standing at sink in bathroom) Where Assessed - Grooming: Unsupported standing Upper Body Bathing: Simulated;Set up Where Assessed - Upper Body Bathing: Supported sitting Lower Body Bathing: Simulated;Set up Where Assessed - Lower Body Bathing: Supported sitting Upper Body Dressing: Simulated;Modified independent Where Assessed - Upper Body Dressing: Unsupported sitting Lower Body Dressing: Performed;Modified independent Where Assessed - Lower Body Dressing: Supported sit to stand;Unsupported sitting Toilet Transfer: Research scientist (life sciences) Method: Sit to Barista: Comfort height toilet;Grab bars Toileting - Architect and Hygiene: Performed;Supervision/safety Where Assessed - Engineer, mining and Hygiene:  Standing;Sit to stand from 3-in-1 or toilet Tub/Shower Transfer Method: Not assessed Equipment Used: Gait belt Transfers/Ambulation Related to ADLs: Pt currently supervision level assist this am, R foot w/ slight drop/drag, however both pt and family members report this is baseline level since previous CVA. Pt does not ambulated w/ AD, defer to PT. ADL Comments: Pt & family members were educated in role of OT and pt was agreeable to performing ADL's and functional transfers. Pt is overall supervision-mod I level for ADL's. Pt & family report that they are present & provide set up for showering (pt sits on tub bench in walk in after set-up from daughter or son-in-law). Both pt & family feel as though pt is at baseline level related to ADL tasks & functional transfers. They state no further needs from OT at this time, as pt has DME & 24/7 supervision/assist. They are agreeable to SLP & PT. Will sign off OT    OT Diagnosis:    OT Problem List:   OT Treatment Interventions:     OT Goals(Current goals can be found in the care plan section) Acute Rehab OT Goals Patient Stated Goal: Get home. Speech therapy  Visit Information  Last OT Received On: 12/03/12 Assistance Needed: +1 History of Present Illness: Pt presents with speech deficits and facial drooping.         Prior Functioning     Home Living Family/patient expects to be discharged to:: Private residence Living Arrangements: Children Available Help at Discharge: Family;Available 24 hours/day Type of Home: House Home Access: Ramped entrance;Stairs to enter Entrance Stairs-Number of Steps: 3 Home Layout: One level Home Equipment: Tub bench;Grab bars - toilet;Grab bars - tub/shower;Hand held shower head;Bedside commode Additional Comments: Per daughter pt refuses AD.    Lives With: Family (  Daughter & son-in-law) Prior Function Level of Independence: Needs assistance ADL's / Homemaking Assistance Needed: Family set-up of tub bench,  otherwise Mod I with bathing and dressing.  Family performs homemaking.   Communication Communication: Expressive difficulties Dominant Hand: Right    Vision/Perception Vision - History Baseline Vision: Wears glasses all the time Patient Visual Report: No change from baseline   Cognition  Cognition Arousal/Alertness: Awake/alert Behavior During Therapy: WFL for tasks assessed/performed Overall Cognitive Status: Within Functional Limits for tasks assessed    Extremity/Trunk Assessment Upper Extremity Assessment Upper Extremity Assessment: Overall WFL for tasks assessed (Pt reports baseline level, grossly 4/5 bilateral UE's) Lower Extremity Assessment Lower Extremity Assessment: Defer to PT evaluation LLE Deficits / Details: Generally weak since previous CVA.   Cervical / Trunk Assessment Cervical / Trunk Assessment: Normal    Mobility Bed Mobility Bed Mobility: Left Sidelying to Sit;Sitting - Scoot to Edge of Bed;Sit to Supine;Sit to Sidelying Left Left Sidelying to Sit: 6: Modified independent (Device/Increase time);HOB elevated Sitting - Scoot to Edge of Bed: 6: Modified independent (Device/Increase time);With rail Sit to Supine: 6: Modified independent (Device/Increase time);HOB flat Sit to Sidelying Left: 6: Modified independent (Device/Increase time);HOB flat Details for Bed Mobility Assistance: Pt demonstrated safe bed mobility Transfers Transfers: Sit to Stand;Stand to Sit Sit to Stand: 5: Supervision;From bed;From toilet;With upper extremity assist Stand to Sit: 5: Supervision;To bed;To toilet;With armrests Details for Transfer Assistance: Pt uses grab bar in bathroom to assist w/ controlled decent. States that he has grab bars in bathroom & shower at home as well.        Balance Balance Balance Assessed: No   End of Session OT - End of Session Equipment Utilized During Treatment: Gait belt Activity Tolerance: Patient tolerated treatment well Patient left: in  bed;with call bell/phone within reach;with bed alarm set;with family/visitor present Nurse Communication: Mobility status  GO     Alm Bustard 12/03/2012, 12:35 PM

## 2012-12-03 NOTE — Evaluation (Signed)
Physical Therapy Evaluation Patient Details Name: Casey Collins MRN: [id number] DOB: [birth year] Today's Date: 12/03/2012 Time: 4098-1191 PT Time Calculation (min): 33 min  PT Assessment / Plan / Recommendation History of Present Illness  pt presents with speech deficits and facial drooping.    Clinical Impression  Pt appears to be near baseline mobility since previous CVA.  At this time pt does demo balance deficits and would benefit from HHPT and continued family support.      PT Assessment  Patient needs continued PT services    Follow Up Recommendations  Home health PT;Supervision - Intermittent    Does the patient have the potential to tolerate intense rehabilitation      Barriers to Discharge        Equipment Recommendations  None recommended by PT    Recommendations for Other Services     Frequency Min 4X/week    Precautions / Restrictions Precautions Precautions: Fall Restrictions Weight Bearing Restrictions: No   Pertinent Vitals/Pain Denied pain.        Mobility  Bed Mobility Bed Mobility: Not assessed Transfers Transfers: Stand to Sit Stand to Sit: 4: Min guard;With upper extremity assist;To chair/3-in-1 Details for Transfer Assistance: despite using UEs, pt with uncontrolled descent to chair.   Ambulation/Gait Ambulation/Gait Assistance: 4: Min guard Ambulation Distance (Feet): 130 Feet Assistive device: None Ambulation/Gait Assistance Details: pt with flat foot on L and indicates this is since previous CVA.  Mildly unsteady.  pt with 3 episodes of spontaneous crying while ambulating.  pt indicates since his previous CVA he either cries or laughs.   Gait Pattern: Step-through pattern;Decreased stride length;Left foot flat Stairs: No Wheelchair Mobility Wheelchair Mobility: No Modified Rankin (Stroke Patients Only) Pre-Morbid Rankin Score: Moderate disability Modified Rankin: Moderately severe disability    Exercises     PT Diagnosis:  Difficulty walking  PT Problem List: Decreased strength;Decreased activity tolerance;Decreased balance;Decreased mobility;Decreased coordination PT Treatment Interventions: DME instruction;Gait training;Stair training;Functional mobility training;Therapeutic activities;Therapeutic exercise;Balance training;Neuromuscular re-education;Patient/family education     PT Goals(Current goals can be found in the care plan section) Acute Rehab PT Goals Patient Stated Goal: Take care of his chickens PT Goal Formulation: With patient Time For Goal Achievement: 12/17/12 Potential to Achieve Goals: Good  Visit Information  Last PT Received On: 12/03/12 Assistance Needed: +1 History of Present Illness: pt presents with speech deficits and facial drooping.         Prior Functioning  Home Living Family/patient expects to be discharged to:: Private residence Living Arrangements: Children Available Help at Discharge: Family;Available 24 hours/day Type of Home: House Home Access: Ramped entrance;Stairs to enter Entrance Stairs-Number of Steps: 3 Home Layout: One level Home Equipment: Tub bench Additional Comments: Per daughter pt refuses AD.   Prior Function Level of Independence: Needs assistance ADL's / Homemaking Assistance Needed: Family set-up of tub bench, otherwise Mod I with bathing and dressing.  Family performs homemaking.   Communication Communication: Expressive difficulties Dominant Hand: Right    Cognition  Cognition Arousal/Alertness: Awake/alert Behavior During Therapy: WFL for tasks assessed/performed Overall Cognitive Status: Within Functional Limits for tasks assessed    Extremity/Trunk Assessment Upper Extremity Assessment Upper Extremity Assessment: Defer to OT evaluation Lower Extremity Assessment Lower Extremity Assessment: LLE deficits/detail LLE Deficits / Details: Generally weak since previous CVA.   Cervical / Trunk Assessment Cervical / Trunk Assessment:  Normal   Balance Balance Balance Assessed: No  End of Session PT - End of Session Equipment Utilized During Treatment: Gait belt Activity Tolerance:  Patient tolerated treatment well Patient left: in chair;with call bell/phone within reach;with family/visitor present Nurse Communication: Mobility status  GP     Sunny Schlein, [place] [phone] 12/03/2012, 10:38 AM

## 2012-12-03 NOTE — Progress Notes (Signed)
CBGs on 11/5:  205-190-248-215 mg/dl      40/9:  81-191 mg/dl Recommend changing Novolog SENSITIVE correction scale to [facility] & HS if eating.  May need Lantus 10 units daily if CBGs continue greater than 180 mg/dl.   Patient takes Lantus 10 units twice a day at home.  Will continue to follow while in hospital.  Smith Mince RN BSN CDE

## 2012-12-03 NOTE — Progress Notes (Signed)
Stroke Team Progress Note  HISTORY Casey Collins is an 77 y.o. male a history of diabetes mellitus, hypertension, hyperlipidemia and previous stroke, presenting with new onset speech difficulty which started at noon today. Patient had difficulty with swallowing in addition to forming words. He was noted to have right facial droop and increased weakness involving right extremities. . Once he began to speak family members noted that speech was slurred. His been taking Plavix 75 mg per day. CT scan of his head showed no acute intracranial abnormality. NIH stroke score was 3. Patient was not a candidate for TPA because he was well out of the time window for treatment consideration. As well, deficits had improved significantly by the time he sought medical attention.   LSN: 12:00 noon 12/01/2012  tPA Given: No: Minimal residual deficits; beyond time window for treatment consideration.  MRankin: 1   He was admitted for further evaluation and treatment.  SUBJECTIVE His family is at the bedside.  Overall he feels his condition is gradually improving. OBJECTIVE Most recent Vital Signs: Filed Vitals:   12/02/12 2029 12/03/12 0203 12/03/12 0508 12/03/12 0900  BP: 161/73 153/62 158/80 157/76  Pulse: 62 50 47 53  Temp: 98.4 F (36.9 C) 97.8 F (36.6 C) 97.6 F (36.4 C) 97.5 F (36.4 C)  TempSrc: Oral Oral Oral Oral  Resp: 18 18 18 18   Height:      Weight:      SpO2: 96% 96% 97% 96%   CBG (last 3)   Recent Labs  12/03/12 0013 12/03/12 0347 12/03/12 1107  GLUCAP 163* 98 206*    IV Fluid Intake:   . sodium chloride 100 mL/hr at 12/01/12 1929    MEDICATIONS  . aspirin EC  325 mg Oral Daily  . citalopram  10 mg Oral Daily  . heparin  5,000 Units Subcutaneous Q8H  . insulin aspart  0-9 Units Subcutaneous Q6H  . metoprolol tartrate  25 mg Oral BID  . simvastatin  40 mg Oral QHS   PRN:  LORazepam, RESOURCE THICKENUP CLEAR, senna-docusate  Diet:  Dysphagia honey-thick  liquids Activity:  Bedrest DVT Prophylaxis:  Heparin SQ  CLINICALLY SIGNIFICANT STUDIES Basic Metabolic Panel:   Recent Labs Lab 12/01/12 1905 12/02/12 0638  NA 143 139  K 5.1 4.5  CL 105 106  CO2  --  25  GLUCOSE 85 178*  BUN 29* 23  CREATININE 1.60* 1.24  CALCIUM  --  9.2   Liver Function Tests:   Recent Labs Lab 12/02/12 0638  AST 21  ALT 15  ALKPHOS 64  BILITOT 0.4  PROT 6.7  ALBUMIN 3.8   CBC:   Recent Labs Lab 12/01/12 1850 12/01/12 1905 12/02/12 0638  WBC 9.9  --  7.8  NEUTROABS 5.1  --  6.9  HGB 14.3 15.0 14.0  HCT 41.9 44.0 40.7  MCV 93.3  --  91.9  PLT 225  --  192   Coagulation: No results found for this basename: LABPROT, INR,  in the last 168 hours Cardiac Enzymes:   Recent Labs Lab 12/01/12 1903  TROPONINI <0.30   Urinalysis:   Recent Labs Lab 12/02/12 0111  COLORURINE YELLOW  LABSPEC 1.012  PHURINE 6.0  GLUCOSEU NEGATIVE  HGBUR NEGATIVE  BILIRUBINUR NEGATIVE  KETONESUR NEGATIVE  PROTEINUR NEGATIVE  UROBILINOGEN 0.2  NITRITE NEGATIVE  LEUKOCYTESUR NEGATIVE   Lipid Panel    Component Value Date/Time   CHOL 140 12/02/2012 0638   TRIG 89 12/02/2012 0638   HDL  34* 12/02/2012 0638   CHOLHDL 4.1 12/02/2012 0638   VLDL 18 12/02/2012 0638   LDLCALC 88 12/02/2012 0638   HgbA1C  Lab Results  Component Value Date   HGBA1C 5.9* 12/02/2012    Urine Drug Screen:     Component Value Date/Time   LABOPIA NONE DETECTED 12/29/2008 0225   COCAINSCRNUR NONE DETECTED 12/29/2008 0225   LABBENZ NONE DETECTED 12/29/2008 0225   AMPHETMU NONE DETECTED 12/29/2008 0225   THCU POSITIVE* 12/29/2008 0225   LABBARB  Value: NONE DETECTED        DRUG SCREEN FOR MEDICAL PURPOSES ONLY.  IF CONFIRMATION IS NEEDED FOR ANY PURPOSE, NOTIFY LAB WITHIN 5 DAYS.        LOWEST DETECTABLE LIMITS FOR URINE DRUG SCREEN Drug Class       Cutoff (ng/mL) Amphetamine      1000 Barbiturate      200 Benzodiazepine   200 Tricyclics       300 Opiates          300 Cocaine           300 THC              50 12/29/2008 0225    Alcohol Level: No results found for this basename: ETH,  in the last 168 hours  Ct Head Wo Contrast 12/01/2012  Chronic small vessel ischemia, with progression of disease since 2010. No acute cortically based infarct or acute intracranial hemorrhage.   Dg Swallowing Func-speech Pathology 12/02/2012   Chales Abrahams, CCC-SLP     12/02/2012 10:01 AM Objective Swallowing Evaluation: Modified Barium Swallowing Study   Patient Details  Name: Casey Collins MRN: [id number] Date of Birth: [birth year]  Today's Date: 12/02/2012 Time: 0905-0925 SLP Time Calculation (min): 20 min  Past Medical History:  Past Medical History  Diagnosis Date  . Stroke     Right side   . Diabetes mellitus   . Arthritis   . Hypertension   . Hyperlipemia   . Diverticulosis   . Colon polyp    Past Surgical History:  Past Surgical History  Procedure Laterality Date  . Bilateral inguinal hernia     HPI:  77 yo male adm to [facility] with slurred speech.  CT head negative for  acute event.  PMH + Belll's Palsy resulting in right facial  weakness (upper and lower), CVA Nov 2010 impairing speech and  with Rt HP.  Pt speaking better at home per family and was  starting to walk.       Assessment / Plan / Recommendation Clinical Impression  Dysphagia Diagnosis: Moderate oral phase dysphagia;Moderate  pharyngeal phase dysphagia  Clinical impression: Moderate oropharyngeal dysphagia  characterized by decreased oral bolus cohesion/control due to  hypoglossal and glossopharyngeal involvement resulting in  premature spillage into pharynx. Pharyngeal swallow was strong  albeit delayed to pyriform sinus and vallecular space resulting  in + audible aspiration of thin liquids - even with chin tuck  posture.  Unfortunately cough is weak and nonprotective and  aspiration appears to cause mild distress.   Chin tuck posture  effectively protects airway with pudding, honey and even nectar.   However given severity of weakness of  cough and distress with  aspiration, recommend to start with honey thick liquids.  Skilled  intervention included education of pt, family to precautions and  reinforcement of strategies.    Pt noted to have secretions retained in airway that he was unable  to clear- therefore recommend oral care  after meals.    As pt has been coughing some with intake at home PTA, suspect  component of chronic aspiration - now with worsened significantly  by this event.      Treatment Recommendation  Therapy as outlined in treatment plan below    Diet Recommendation Honey-thick liquid;Dysphagia 1 (Puree)   Liquid Administration via: Cup Medication Administration: Crushed with puree Supervision: Patient able to self feed;Full supervision/cueing  for compensatory strategies Compensations: Slow rate;Small sips/bites;Check for pocketing Postural Changes and/or Swallow Maneuvers: Seated upright 90  degrees;Chin tuck    Other  Recommendations Recommended Consults: MBS Oral Care Recommendations:  (oral care after meals) Other Recommendations: Order thickener from pharmacy   Follow Up Recommendations  Inpatient Rehab    Frequency and Duration min 2x/week  2 weeks   Pertinent Vitals/Pain Afebrile, decreased    SLP Swallow Goals     General Date of Onset: 12/02/12 HPI: 77 yo male adm to [facility] with slurred speech.  CT head negative  for acute event.  PMH + Belll's Palsy resulting in right facial  weakness (upper and lower), CVA Nov 2010 impairing speech and  with Rt HP.  Pt speaking better at home per family and was  starting to walk.   Type of Study: Modified Barium Swallowing Study Previous Swallow Assessment: bedside eval in 2010 per family, no  info found in epic Diet Prior to this Study: NPO Temperature Spikes Noted: No Respiratory Status: Room air History of Recent Intubation: No Behavior/Cognition: Alert;Cooperative;Pleasant mood Oral Cavity - Dentition:  (four upper teeth, lower dentition  intact, pt has no dentures/partial) Oral Motor /  Sensory Function: Impaired - see Bedside swallow  eval Self-Feeding Abilities: Able to feed self Patient Positioning: Upright in chair Baseline Vocal Quality: Breathy;Low vocal intensity Volitional Cough: Weak Volitional Swallow: Able to elicit Anatomy: Within functional limits Pharyngeal Secretions: Standing secretions in (comment)  (secretions mix with barium, + aspiration of secretions during  MBS)    Reason for Referral   concern for aspiration after suspected  neuro event  Oral Phase Oral Preparation/Oral Phase Oral Phase: Impaired Oral - Honey Oral - Honey Teaspoon: Delayed oral transit;Weak lingual  manipulation;Reduced posterior propulsion Oral - Nectar Oral - Nectar Teaspoon: Reduced posterior propulsion;Weak lingual  manipulation;Delayed oral transit Oral - Nectar Cup: Reduced posterior propulsion;Weak lingual  manipulation;Delayed oral transit Oral - Nectar Straw: Reduced posterior propulsion;Weak lingual  manipulation;Delayed oral transit Oral - Thin Oral - Thin Cup: Weak lingual manipulation;Reduced posterior  propulsion;Delayed oral transit Oral - Solids Oral - Puree: Reduced posterior propulsion;Weak lingual  manipulation;Delayed oral transit Oral - Regular: Reduced posterior propulsion;Weak lingual  manipulation;Delayed oral transit;Impaired mastication Oral Phase - Comment Oral Phase - Comment: chin tuck posture effective for airway  protection with nectar, honey, pudding and cracker - still with +  overt audible asp of thin with chin tuck   Pharyngeal Phase Pharyngeal Phase Pharyngeal Phase: Impaired Pharyngeal - Honey Pharyngeal - Honey Teaspoon: Delayed swallow initiation;Premature  spillage to pyriform sinuses;Premature spillage to valleculae Pharyngeal - Nectar Pharyngeal - Nectar Teaspoon: Delayed swallow  initiation;Premature spillage to valleculae;Premature spillage to  pyriform sinuses Pharyngeal - Nectar Cup: Delayed swallow initiation;Premature  spillage to pyriform sinuses;Premature  spillage to  valleculae;Penetration/Aspiration during swallow Penetration/Aspiration details (nectar cup): Material enters  airway, remains ABOVE vocal cords then ejected out Pharyngeal - Nectar Straw: Delayed swallow initiation;Premature  spillage to valleculae;Premature spillage to pyriform sinuses Pharyngeal - Thin Pharyngeal - Thin Cup: Significant aspiration  (Amount);Penetration/Aspiration during swallow;Delayed swallow  initiation;Premature spillage to valleculae;Premature spillage to  pyriform sinuses Penetration/Aspiration details (thin cup): Material enters  airway, passes BELOW cords and not ejected out despite cough  attempt by patient Pharyngeal - Solids Pharyngeal - Puree: Penetration/Aspiration during  swallow;Premature spillage to valleculae;Premature spillage to  pyriform sinuses Penetration/Aspiration details (puree): Material enters airway,  remains ABOVE vocal cords then ejected out Pharyngeal - Regular: Delayed swallow initiation;Premature  spillage to valleculae Pharyngeal Phase - Comment Pharyngeal Comment: chin tuck posture effective for airway  protection with nectar, honey, pudding and cracker - still with +  overt audible asp of thin with chin tuck  Cervical Esophageal Phase    GO    Cervical Esophageal Phase Cervical Esophageal Phase: WFL (esophagus appeared clear upon  sweep x2)         Donavan Burnet, MS [facility] SLP [phone]    MRI of the brain-----   MRA of the brain    2D Echocardiogram  F 55%, wall motion normal. No cardiac source of emboli identified.  Carotid Doppler  Right: 1-39% ICA stenosis. Left: 40-59% ICA stenosis. Vertebral artery flow is antegrade  CXR    EKG  normal sinus rhythm.   Therapy Recommendations home health  Physical Exam   Mental Status:  Alert, oriented, thought content appropriate. Speech moderately slurred without evidence of aphasia. Able to follow commands without difficulty.  Cranial Nerves:  II-Visual fields were normal.  III/IV/VI-Pupils  were equal and reacted. Extraocular movements were full and conjugate.  V/VII-no facial numbness; mild right lower facial weakness.  VIII-normal.  X-moderate dysarthria.  Motor: 5/5 bilaterally with normal tone and bulk  Sensory: Slightly reduced perception of tactile sensation over right extremities compared to left extremities.  Deep Tendon Reflexes: 2+ and symmetric.  Plantars: Flexor bilaterally  Cerebellar: Normal finger-to-nose testing.   ASSESSMENT Mr. Casey Collins is a 77 y.o. male presenting with dysarthria, dysphagia. MR Imaging pending, symptoms secondary to unknown source.  On clopidogrel 75 mg orally every day prior to admission. Now on clopidogrel 75 mg orally every day for secondary stroke prevention. Patient with resultant dysarthria, dysphasia. Work up underway.   Diabetes mellitus, type 2, uncontrolled, HGB A1C 11.5  Hyperlipidemia, LDL 88, goal < 70 for diabetics, continue statin  Illicit drug use, cessation counseling  Hypertension  Hospital day # 2  TREATMENT/PLAN  Change to aspirin 325mg  daily (so as to be crushed, until can swallow whole pill, then plavix) for secondary stroke prevention.  MRI pending  Home health recommended  Risk factor modification  Dr. Pearlean Brownie discussed plan of care with patient and daughter.Larita Fife D. Manson Passey, [facility], MBA, MHA [facility] Pager: [phone] 12/03/2012 11:16 AM  I have personally obtained a history, examined the patient, evaluated imaging results, and formulated the assessment and plan of care. I agree with the above. Delia Heady, MD

## 2012-12-03 NOTE — Progress Notes (Signed)
[facility]                                                                                Patient Demographics  Casey Collins, is a 77 y.o. male, DOB - 12/09/1935, ZOX:[id number]  Admit date - 12/01/2012   Admitting Physician Casey Oxford, MD  Outpatient Primary MD for the patient is Casey Meo, MD  LOS - 2   Chief Complaint  Patient presents with  . Aphasia        Assessment & Plan  Principal Problem:   CVA (cerebral infarction) Active Problems:   HTN (hypertension)   DM (dermatomyositis)   Osteoarthritis   Acute kidney injury   Difficulty swallowing  CVA (cerebral infarction)  -Improving speech aphasia and right sided weakness -Patient was not TPA candidate due to time -Speech therapy, PT, and OT consulted -Neurology following -Pending MRI/MRA -Continue plavix and simvastatin  Difficulty swallowing -Lump sensation in throat -Speech to evaluate swallowing, NPO for now -Will continue to monitor -Ultrasound 1. Normal thyroid. 2. Palpable area of concern seems to correspond to the left submandibular gland which appears mildly asymmetrically enlarged compared to the right SMG. Consider left sialoadenitis or obstructing silolithiasis.  Hypertension  -Will restart home medications of metoprolol once evaluated by speech  Diabetes mellitus  -Continue sliding scale.  Acute Kidney Injury -Cr at time of admission was 1.6. Currently 1.24 -Will continue to monitor -Holding ramipril.  Code Status: Full  Family Communication: daughter at bedside  Disposition Plan: Admitted, likely will go home with home health as patient's daughter daughter stated that the patient would not want to go to rehab.  Procedures  Study Conclusions - Procedure narrative: Transthoracic echocardiography. Image quality was adequate. The study was technically difficult. - Left ventricle: The cavity size was normal. Wall thickness was increased in a pattern of mild LVH. Systolic  function was normal. The estimated ejection fraction was in the range of 55% to 60%. Wall motion was normal; there were no regional wall motion abnormalities. Doppler parameters are consistent with abnormal left ventricular relaxation (grade 1 diastolic dysfunction). - Aortic valve: Mild regurgitation. Impressions:- No cardiac source of emboli was indentified.  Carotid Duplex Preliminary report: Right: 1-39% ICA stenosis. Left: 40-59% ICA stenosis. Vertebral artery flow is antegrade  Consults   Neurology  DVT Prophylaxis  Heparin  Lab Results  Component Value Date   PLT 192 12/02/2012    Medications  Scheduled Meds: . aspirin EC  325 mg Oral Daily  . citalopram  10 mg Oral Daily  . heparin  5,000 Units Subcutaneous Q8H  . insulin aspart  0-9 Units Subcutaneous Q6H  . metoprolol tartrate  25 mg Oral BID  . simvastatin  40 mg Oral QHS   Continuous Infusions: . sodium chloride 100 mL/hr at 12/01/12 1929   PRN Meds:.LORazepam, RESOURCE THICKENUP CLEAR, senna-docusate  Antibiotics    Anti-infectives   None       Time Spent in minutes   30 minutes   Casey Collins D.O. on 12/03/2012 at 12:28 PM  Between 7am to 7pm - Pager - [phone]  After 7pm go to [email or web address] - password [email or web address]  And look for the night coverage person  covering for me after hours  [facility] Office  [phone]    Subjective:   Casey Collins seen and examined today.  Patient states he is feeling better. He feels that his speech is improving from yesterday. He no longer feels the weakness in his right side. Patient does complain of lump in throat. Patient denies dizziness, chest pain, shortness of breath, abdominal pain, N/V/D/C, new weakness, numbess, tingling.    Objective:   Filed Vitals:   12/02/12 2029 12/03/12 0203 12/03/12 0508 12/03/12 0900  BP: 161/73 153/62 158/80 157/76  Pulse: 62 50 47 53  Temp: 98.4 F (36.9 C) 97.8 F (36.6 C) 97.6 F (36.4 C) 97.5 F  (36.4 C)  TempSrc: Oral Oral Oral Oral  Resp: 18 18 18 18   Height:      Weight:      SpO2: 96% 96% 97% 96%    Wt Readings from Last 3 Encounters:  12/01/12 90.266 kg (199 lb)  10/18/11 93.441 kg (206 lb)  07/19/11 96.163 kg (212 lb)     Intake/Output Summary (Last 24 hours) at 12/03/12 1228 Last data filed at 12/03/12 1000  Gross per 24 hour  Intake    240 ml  Output    150 ml  Net     90 ml    Exam  General: Well developed, well nourished, NAD, appears stated age  HEENT: NCAT, PERRLA, EOMI, Anicteic Sclera, mucous membranes moist. No pharyngeal erythema or exudates  Neck: Supple, no JVD, Nodular density palpated   Cardiovascular: S1 S2 auscultated, 2/6 SEM, Regular rate and rhythm.  Respiratory: Clear to auscultation bilaterally with equal chest rise  Abdomen: Soft, nontender, nondistended, + bowel sounds  Extremities: warm dry without cyanosis clubbing or edema  Neuro: AAOx3, cranial nerves grossly intact. Strength 5/5 in lower and upper extremities  Skin: Without rashes exudates or nodules  Psych: Normal affect and demeanor   Data Review   Micro Results No results found for this or any previous visit (from the past 240 hour(s)).  Radiology Reports Ct Head Wo Contrast  12/01/2012   CLINICAL DATA:  77 year old male code stroke. Slurred speech and difficulty swallowing. Initial encounter.  EXAM: CT HEAD WITHOUT CONTRAST  TECHNIQUE: Contiguous axial images were obtained from the base of the skull through the vertex without intravenous contrast.  COMPARISON:  01/04/2009 and earlier.  FINDINGS: Increased paranasal sinus mucosal thickening and opacification. Mastoids and tympanic cavities remain clear. No acute osseous abnormality identified. Visualized orbits and scalp soft tissues are within normal limits.  Calcified atherosclerosis at the skull base. Interval evolution of the left coronal radiata lacunar infarct which was subacute on the most recent comparison.  Chronic lacunar infarcts in the bilateral deep gray matter nuclei and subcortical white matter. New focus of involvement in the left frontal lobe on series 2, image 26, but appears chronic. Small left inferior cerebellar lacunar infarcts also are new since 2010.  No acute intracranial hemorrhage identified. No midline shift, mass effect, or evidence of intracranial mass lesion. No ventriculomegaly. No evidence of cortically based acute infarction identified. No suspicious intracranial vascular hyperdensity.  IMPRESSION: Chronic small vessel ischemia, with progression of disease since 2010. No acute cortically based infarct or acute intracranial hemorrhage.  Study discussed by telephone with Dr. Amada Jupiter On 12/01/2012 at 19:08 .   Electronically Signed   By: Augusto Gamble M.D.   On: 12/01/2012 19:09   Dg Swallowing Func-speech Pathology  12/02/2012   Chales Abrahams, CCC-SLP  12/02/2012 10:01 AM Objective Swallowing Evaluation: Modified Barium Swallowing Study   Patient Details  Name: MAHONRI SEIDEN MRN: [id number] Date of Birth: 1935-02-13  Today's Date: 12/02/2012 Time: 0905-0925 SLP Time Calculation (min): 20 min  Past Medical History:  Past Medical History  Diagnosis Date  . Stroke     Right side   . Diabetes mellitus   . Arthritis   . Hypertension   . Hyperlipemia   . Diverticulosis   . Colon polyp    Past Surgical History:  Past Surgical History  Procedure Laterality Date  . Bilateral inguinal hernia     HPI:  77 yo male adm to [facility] with slurred speech.  CT head negative for  acute event.  PMH + Belll's Palsy resulting in right facial  weakness (upper and lower), CVA Nov 2010 impairing speech and  with Rt HP.  Pt speaking better at home per family and was  starting to walk.       Assessment / Plan / Recommendation Clinical Impression  Dysphagia Diagnosis: Moderate oral phase dysphagia;Moderate  pharyngeal phase dysphagia  Clinical impression: Moderate oropharyngeal dysphagia  characterized by decreased oral  bolus cohesion/control due to  hypoglossal and glossopharyngeal involvement resulting in  premature spillage into pharynx. Pharyngeal swallow was strong  albeit delayed to pyriform sinus and vallecular space resulting  in + audible aspiration of thin liquids - even with chin tuck  posture.  Unfortunately cough is weak and nonprotective and  aspiration appears to cause mild distress.   Chin tuck posture  effectively protects airway with pudding, honey and even nectar.   However given severity of weakness of cough and distress with  aspiration, recommend to start with honey thick liquids.  Skilled  intervention included education of pt, family to precautions and  reinforcement of strategies.    Pt noted to have secretions retained in airway that he was unable  to clear- therefore recommend oral care after meals.    As pt has been coughing some with intake at home PTA, suspect  component of chronic aspiration - now with worsened significantly  by this event.      Treatment Recommendation  Therapy as outlined in treatment plan below    Diet Recommendation Honey-thick liquid;Dysphagia 1 (Puree)   Liquid Administration via: Cup Medication Administration: Crushed with puree Supervision: Patient able to self feed;Full supervision/cueing  for compensatory strategies Compensations: Slow rate;Small sips/bites;Check for pocketing Postural Changes and/or Swallow Maneuvers: Seated upright 90  degrees;Chin tuck    Other  Recommendations Recommended Consults: MBS Oral Care Recommendations:  (oral care after meals) Other Recommendations: Order thickener from pharmacy   Follow Up Recommendations  Inpatient Rehab    Frequency and Duration min 2x/week  2 weeks   Pertinent Vitals/Pain Afebrile, decreased    SLP Swallow Goals     General Date of Onset: 12/02/12 HPI: 77 yo male adm to [facility] with slurred speech.  CT head negative  for acute event.  PMH + Belll's Palsy resulting in right facial  weakness (upper and lower), CVA Nov 2010  impairing speech and  with Rt HP.  Pt speaking better at home per family and was  starting to walk.   Type of Study: Modified Barium Swallowing Study Previous Swallow Assessment: bedside eval in 2010 per family, no  info found in epic Diet Prior to this Study: NPO Temperature Spikes Noted: No Respiratory Status: Room air History of Recent Intubation: No Behavior/Cognition: Alert;Cooperative;Pleasant mood Oral Cavity - Dentition:  (four upper  teeth, lower dentition  intact, pt has no dentures/partial) Oral Motor / Sensory Function: Impaired - see Bedside swallow  eval Self-Feeding Abilities: Able to feed self Patient Positioning: Upright in chair Baseline Vocal Quality: Breathy;Low vocal intensity Volitional Cough: Weak Volitional Swallow: Able to elicit Anatomy: Within functional limits Pharyngeal Secretions: Standing secretions in (comment)  (secretions mix with barium, + aspiration of secretions during  MBS)    Reason for Referral   concern for aspiration after suspected  neuro event  Oral Phase Oral Preparation/Oral Phase Oral Phase: Impaired Oral - Honey Oral - Honey Teaspoon: Delayed oral transit;Weak lingual  manipulation;Reduced posterior propulsion Oral - Nectar Oral - Nectar Teaspoon: Reduced posterior propulsion;Weak lingual  manipulation;Delayed oral transit Oral - Nectar Cup: Reduced posterior propulsion;Weak lingual  manipulation;Delayed oral transit Oral - Nectar Straw: Reduced posterior propulsion;Weak lingual  manipulation;Delayed oral transit Oral - Thin Oral - Thin Cup: Weak lingual manipulation;Reduced posterior  propulsion;Delayed oral transit Oral - Solids Oral - Puree: Reduced posterior propulsion;Weak lingual  manipulation;Delayed oral transit Oral - Regular: Reduced posterior propulsion;Weak lingual  manipulation;Delayed oral transit;Impaired mastication Oral Phase - Comment Oral Phase - Comment: chin tuck posture effective for airway  protection with nectar, honey, pudding and cracker -  still with +  overt audible asp of thin with chin tuck   Pharyngeal Phase Pharyngeal Phase Pharyngeal Phase: Impaired Pharyngeal - Honey Pharyngeal - Honey Teaspoon: Delayed swallow initiation;Premature  spillage to pyriform sinuses;Premature spillage to valleculae Pharyngeal - Nectar Pharyngeal - Nectar Teaspoon: Delayed swallow  initiation;Premature spillage to valleculae;Premature spillage to  pyriform sinuses Pharyngeal - Nectar Cup: Delayed swallow initiation;Premature  spillage to pyriform sinuses;Premature spillage to  valleculae;Penetration/Aspiration during swallow Penetration/Aspiration details (nectar cup): Material enters  airway, remains ABOVE vocal cords then ejected out Pharyngeal - Nectar Straw: Delayed swallow initiation;Premature  spillage to valleculae;Premature spillage to pyriform sinuses Pharyngeal - Thin Pharyngeal - Thin Cup: Significant aspiration  (Amount);Penetration/Aspiration during swallow;Delayed swallow  initiation;Premature spillage to valleculae;Premature spillage to  pyriform sinuses Penetration/Aspiration details (thin cup): Material enters  airway, passes BELOW cords and not ejected out despite cough  attempt by patient Pharyngeal - Solids Pharyngeal - Puree: Penetration/Aspiration during  swallow;Premature spillage to valleculae;Premature spillage to  pyriform sinuses Penetration/Aspiration details (puree): Material enters airway,  remains ABOVE vocal cords then ejected out Pharyngeal - Regular: Delayed swallow initiation;Premature  spillage to valleculae Pharyngeal Phase - Comment Pharyngeal Comment: chin tuck posture effective for airway  protection with nectar, honey, pudding and cracker - still with +  overt audible asp of thin with chin tuck  Cervical Esophageal Phase    GO    Cervical Esophageal Phase Cervical Esophageal Phase: WFL (esophagus appeared clear upon  sweep x2)         Donavan Burnet, MS [facility] SLP [phone]     CBC  Recent Labs Lab 12/01/12 1850  12/01/12 1905 12/02/12 0638  WBC 9.9  --  7.8  HGB 14.3 15.0 14.0  HCT 41.9 44.0 40.7  PLT 225  --  192  MCV 93.3  --  91.9  MCH 31.8  --  31.6  MCHC 34.1  --  34.4  RDW 13.3  --  13.3  LYMPHSABS 3.3  --  0.9  MONOABS 1.0  --  0.1  EOSABS 0.5  --  0.0  BASOSABS 0.0  --  0.0    Chemistries   Recent Labs Lab 12/01/12 1905 12/02/12 0638  NA 143 139  K 5.1 4.5  CL 105 106  CO2  --  25  GLUCOSE 85 178*  BUN 29* 23  CREATININE 1.60* 1.24  CALCIUM  --  9.2  AST  --  21  ALT  --  15  ALKPHOS  --  64  BILITOT  --  0.4   ------------------------------------------------------------------------------------------------------------------ estimated creatinine clearance is 58.2 ml/min (by C-G formula based on Cr of 1.24). ------------------------------------------------------------------------------------------------------------------  Recent Labs  12/02/12 0638  HGBA1C 5.9*   ------------------------------------------------------------------------------------------------------------------  Recent Labs  12/02/12 0638  CHOL 140  HDL 34*  LDLCALC 88  TRIG 89  CHOLHDL 4.1   ------------------------------------------------------------------------------------------------------------------ No results found for this basename: TSH, T4TOTAL, FREET3, T3FREE, THYROIDAB,  in the last 72 hours ------------------------------------------------------------------------------------------------------------------ No results found for this basename: VITAMINB12, FOLATE, FERRITIN, TIBC, IRON, RETICCTPCT,  in the last 72 hours  Coagulation profile No results found for this basename: INR, PROTIME,  in the last 168 hours  No results found for this basename: DDIMER,  in the last 72 hours  Cardiac Enzymes  Recent Labs Lab 12/01/12 1903  TROPONINI <0.30   ------------------------------------------------------------------------------------------------------------------ No components found  with this basename: POCBNP,

## 2012-12-03 NOTE — Progress Notes (Signed)
Speech Language Pathology Treatment: Dysphagia;Cognitive-Linquistic  Patient Details Name: Casey Collins MRN: [id number] DOB: [birth year] Today's Date: 12/03/2012 Time: 0801-0829 SLP Time Calculation (min): 28 min  Assessment / Plan / Recommendation Clinical Impression  Pt today sitting upright completing his breakfast tray upon SLP entrance to room.  Pt conducting chin tuck posture without cues - but SLP recommends continue full supervision due to level of dysphagia and decreased airway protection.  Pt is continuing to cough on secretions - aspiration of secretions also suspected premorbidly.  Intake of food at breakfast good - 100% but intake of liquids was poor.  Family and pt report pt is fearful to consume liquids due to frequent choking.  RN had thickened liquids further for pt, and pt consuming liquid via tsp - which is aiding airway protection.  Educated family to thicken liquids to pudding if needed for comfort and try to assure adequacy of liquid intake. [place] Water Protocol worksheet given to family to incorporate between meals - even tsp amounts with chin tuck.  Informed pt of need to make effort to conduct dry swallows due to oropharyngeal sensory impairment and aspiration of secretions.  Oral care importance also reviewed. Teach back utilized to assure comprehension.   Pt met goal of min assist for compensation strategies re: swallow.  Speech intelligibility strategy addressed with SLP providing pt with verbal/visual cues to over articulate to improve speech intelligibility.  Pt initially required max cues - fading to moderate during conversation.   Pt was also fatigued from eating full breakfast.  SLP also educated family/pt to need to minimize background noise to facilitate ease of communication and to conduct short "sessions" of dysarthria tx to decrease fatigue.   Pt is making good progress toward goals, family states pt to go home with home health.  Pt will benefit from follow up  SLP via home health to maximize functional speech/swallow abilities.  Thanks for allowing me to help with this pt care.    HPI HPI: 77 yo male adm to [facility] with slurred speech.  CT head negative for acute event.  PMH + Belll's Palsy resulting in right facial weakness (upper and lower), CVA Nov 2010 impairing speech and with Rt HP.  Pt speaking better at home per family and was starting to walk. Pt today reports h/o Bell's Palsy on left side as well previously.      Pertinent Vitals Afebrile, decreased  SLP Plan  Continue with current plan of care    Recommendations Diet recommendations: Honey-thick liquid;Dysphagia 1 (puree) Liquids provided via: Teaspoon;Cup Medication Administration: Crushed with puree Supervision: Patient able to self feed;Full supervision/cueing for compensatory strategies Compensations: Slow rate;Small sips/bites Postural Changes and/or Swallow Maneuvers: Seated upright 90 degrees;Upright 30-60 min after meal;Chin tuck              Oral Care Recommendations: Oral care before and after PO (due to decreased management of secretions) Follow up Recommendations: Home health SLP (family stated they want to take pt home) Plan: Continue with current plan of care    GO     Donavan Burnet, MS [facility] SLP [phone]

## 2012-12-03 NOTE — Progress Notes (Signed)
Dr. Riley Kill consulted 11/5. I await PT and OT evaluations to determine rehab venue options. [phone]

## 2012-12-04 DIAGNOSIS — I6350 Cerebral infarction due to unspecified occlusion or stenosis of unspecified cerebral artery: Secondary | ICD-10-CM | POA: Diagnosis not present

## 2012-12-04 DIAGNOSIS — R1310 Dysphagia, unspecified: Secondary | ICD-10-CM | POA: Diagnosis not present

## 2012-12-04 DIAGNOSIS — I10 Essential (primary) hypertension: Secondary | ICD-10-CM | POA: Diagnosis not present

## 2012-12-04 DIAGNOSIS — N179 Acute kidney failure, unspecified: Secondary | ICD-10-CM | POA: Diagnosis not present

## 2012-12-04 LAB — GLUCOSE, CAPILLARY
Glucose-Capillary: 110 mg/dL — ABNORMAL HIGH (ref 70–99)
Glucose-Capillary: 107 mg/dL — ABNORMAL HIGH (ref 70–99)

## 2012-12-04 MED ORDER — OXYCODONE-ACETAMINOPHEN 5-325 MG PO TABS
1.0000 | ORAL_TABLET | Freq: Once | ORAL | Status: AC
  Administered 2012-12-04: 1 via ORAL
  Filled 2012-12-04: qty 1

## 2012-12-04 MED ORDER — RESOURCE THICKENUP CLEAR PO POWD: 1.0000 | ORAL | Status: DC | PRN

## 2012-12-04 MED ORDER — ASPIRIN 325 MG PO TABS: 325.0000 mg | ORAL_TABLET | Freq: Every day | ORAL | Status: DC

## 2012-12-04 MED ORDER — CLOPIDOGREL BISULFATE 75 MG PO TABS: 75.0000 mg | ORAL_TABLET | Freq: Every day | ORAL | Status: DC

## 2012-12-04 NOTE — Progress Notes (Signed)
Physical Therapy Treatment Patient Details Name: Casey Collins MRN: [id number] DOB: 12-27-1935 Today's Date: 12/04/2012 Time: 2130-8657 PT Time Calculation (min): 15 min  PT Assessment / Plan / Recommendation  History of Present Illness Pt presents with speech deficits and facial drooping.     PT Comments   Pt progressing well with ambulation and stairs today.  Pt and daughter feel safe for d/c home; educated on safe stair technique.  Pt will benefit from HHPT for further independence with functional tasks.    Follow Up Recommendations  Home health PT;Supervision - Intermittent     Does the patient have the potential to tolerate intense rehabilitation     Barriers to Discharge        Equipment Recommendations  None recommended by PT    Recommendations for Other Services    Frequency Min 4X/week   Progress towards PT Goals Progress towards PT goals: Progressing toward goals  Plan Current plan remains appropriate    Precautions / Restrictions Precautions Precautions: Fall Restrictions Weight Bearing Restrictions: No   Pertinent Vitals/Pain no apparent distress     Mobility  Bed Mobility Bed Mobility: Left Sidelying to Sit;Sitting - Scoot to Edge of Bed Left Sidelying to Sit: 6: Modified independent (Device/Increase time);HOB elevated Sitting - Scoot to Edge of Bed: 6: Modified independent (Device/Increase time);With rail Details for Bed Mobility Assistance: Pt demonstrated safe bed mobility Transfers Transfers: Stand to Sit;Sit to Stand Sit to Stand: 5: Supervision;From bed;With upper extremity assist Stand to Sit: To chair/3-in-1;5: Supervision;With upper extremity assist Details for Transfer Assistance: Pt uses grab bar in bathroom to assist w/ controlled decent. States that he has grab bars in bathroom & shower at home as well. Ambulation/Gait Ambulation/Gait Assistance: 4: Min guard Ambulation Distance (Feet): 200 Feet Assistive device: Rolling  walker Ambulation/Gait Assistance Details: Cues for longer R step length and for upright posture; no crying/laughing during tx today Gait Pattern: Step-through pattern;Decreased stride length;Right foot flat Stairs: Yes Stairs Assistance: 4: Min assist Stairs Assistance Details (indicate cue type and reason): VC's for correct and safest technique; cues to slow down; educated daughter and pt on HHA technique for safety; 4steps x2 Stair Management Technique: One rail Right Number of Stairs: 4    Exercises     PT Diagnosis:    PT Problem List:   PT Treatment Interventions:     PT Goals (current goals can now be found in the care plan section)    Visit Information  Last PT Received On: 12/04/12 Assistance Needed: +1 History of Present Illness: Pt presents with speech deficits and facial drooping.      Subjective Data      Cognition  Cognition Arousal/Alertness: Awake/alert Behavior During Therapy: WFL for tasks assessed/performed Overall Cognitive Status: Within Functional Limits for tasks assessed    Balance     End of Session PT - End of Session Equipment Utilized During Treatment: Gait belt Activity Tolerance: Patient tolerated treatment well Patient left: Other (comment) (on toilet with daughter present) Nurse Communication: Mobility status    GP     Ernestina Columbia, SPTA 12/04/2012, 11:10 AM

## 2012-12-04 NOTE — Progress Notes (Signed)
Stroke Team Progress Note  HISTORY Casey Collins is an 77 y.o. male a history of diabetes mellitus, hypertension, hyperlipidemia and previous stroke, presenting with new onset speech difficulty which started at noon today. Patient had difficulty with swallowing in addition to forming words. He was noted to have right facial droop and increased weakness involving right extremities. . Once he began to speak family members noted that speech was slurred. His been taking Plavix 75 mg per day. CT scan of his head showed no acute intracranial abnormality. NIH stroke score was 3. Patient was not a candidate for TPA because he was well out of the time window for treatment consideration. As well, deficits had improved significantly by the time he sought medical attention.   LSN: 12:00 noon 12/01/2012  tPA Given: No: Minimal residual deficits; beyond time window for treatment consideration.  MRankin: 1   He was admitted for further evaluation and treatment.  SUBJECTIVE Patient lying in bed. Family at bedside.    OBJECTIVE Most recent Vital Signs: Filed Vitals:   12/03/12 2200 12/04/12 0110 12/04/12 0113 12/04/12 0529  BP: 169/75 181/92 172/67 156/68  Pulse: 53 50  43  Temp: 98.4 F (36.9 C) 97.2 F (36.2 C)  97.9 F (36.6 C)  TempSrc: Oral Axillary  Oral  Resp: 20 18  18   Height:      Weight:      SpO2: 97% 97%  98%   CBG (last 3)   Recent Labs  12/03/12 2155 12/04/12 0108 12/04/12 0656  GLUCAP 146* 118* 110*    IV Fluid Intake:   . sodium chloride 100 mL/hr at 12/04/12 0007    MEDICATIONS  . aspirin EC  325 mg Oral Daily  . citalopram  10 mg Oral Daily  . heparin  5,000 Units Subcutaneous Q8H  . insulin aspart  0-9 Units Subcutaneous Q6H  . metoprolol tartrate  25 mg Oral BID  . simvastatin  40 mg Oral QHS   PRN:  LORazepam, RESOURCE THICKENUP CLEAR, senna-docusate  Diet:  Dysphagia honey-thick liquids Activity:  Bedrest DVT Prophylaxis:  Heparin SQ  CLINICALLY  SIGNIFICANT STUDIES Basic Metabolic Panel:   Recent Labs Lab 12/01/12 1905 12/02/12 0638  NA 143 139  K 5.1 4.5  CL 105 106  CO2  --  25  GLUCOSE 85 178*  BUN 29* 23  CREATININE 1.60* 1.24  CALCIUM  --  9.2   Liver Function Tests:   Recent Labs Lab 12/02/12 0638  AST 21  ALT 15  ALKPHOS 64  BILITOT 0.4  PROT 6.7  ALBUMIN 3.8   CBC:   Recent Labs Lab 12/01/12 1850 12/01/12 1905 12/02/12 0638  WBC 9.9  --  7.8  NEUTROABS 5.1  --  6.9  HGB 14.3 15.0 14.0  HCT 41.9 44.0 40.7  MCV 93.3  --  91.9  PLT 225  --  192   Coagulation: No results found for this basename: LABPROT, INR,  in the last 168 hours Cardiac Enzymes:   Recent Labs Lab 12/01/12 1903  TROPONINI <0.30   Urinalysis:   Recent Labs Lab 12/02/12 0111  COLORURINE YELLOW  LABSPEC 1.012  PHURINE 6.0  GLUCOSEU NEGATIVE  HGBUR NEGATIVE  BILIRUBINUR NEGATIVE  KETONESUR NEGATIVE  PROTEINUR NEGATIVE  UROBILINOGEN 0.2  NITRITE NEGATIVE  LEUKOCYTESUR NEGATIVE   Lipid Panel    Component Value Date/Time   CHOL 140 12/02/2012 0638   TRIG 89 12/02/2012 0638   HDL 34* 12/02/2012 0638   CHOLHDL 4.1 12/02/2012  1610   VLDL 18 12/02/2012 0638   LDLCALC 88 12/02/2012 0638   HgbA1C  Lab Results  Component Value Date   HGBA1C 5.9* 12/02/2012    Urine Drug Screen:     Component Value Date/Time   LABOPIA NONE DETECTED 12/29/2008 0225   COCAINSCRNUR NONE DETECTED 12/29/2008 0225   LABBENZ NONE DETECTED 12/29/2008 0225   AMPHETMU NONE DETECTED 12/29/2008 0225   THCU POSITIVE* 12/29/2008 0225   LABBARB  Value: NONE DETECTED        DRUG SCREEN FOR MEDICAL PURPOSES ONLY.  IF CONFIRMATION IS NEEDED FOR ANY PURPOSE, NOTIFY LAB WITHIN 5 DAYS.        LOWEST DETECTABLE LIMITS FOR URINE DRUG SCREEN Drug Class       Cutoff (ng/mL) Amphetamine      1000 Barbiturate      200 Benzodiazepine   200 Tricyclics       300 Opiates          300 Cocaine          300 THC              50 12/29/2008 0225    Alcohol Level: No results  found for this basename: ETH,  in the last 168 hours  Ct Head Wo Contrast 12/01/2012  Chronic small vessel ischemia, with progression of disease since 2010. No acute cortically based infarct or acute intracranial hemorrhage.   Dg Swallowing Func-speech Pathology 12/02/2012   Chales Abrahams, CCC-SLP     12/02/2012 10:01 AM Objective Swallowing Evaluation: Modified Barium Swallowing Study   Patient Details  Name: Casey Collins MRN: [id number] Date of Birth: March 22, 1935  Today's Date: 12/02/2012 Time: 0905-0925 SLP Time Calculation (min): 20 min  Past Medical History:  Past Medical History  Diagnosis Date  . Stroke     Right side   . Diabetes mellitus   . Arthritis   . Hypertension   . Hyperlipemia   . Diverticulosis   . Colon polyp    Past Surgical History:  Past Surgical History  Procedure Laterality Date  . Bilateral inguinal hernia     HPI:  77 yo male adm to [facility] with slurred speech.  CT head negative for  acute event.  PMH + Belll's Palsy resulting in right facial  weakness (upper and lower), CVA Nov 2010 impairing speech and  with Rt HP.  Pt speaking better at home per family and was  starting to walk.       Assessment / Plan / Recommendation Clinical Impression  Dysphagia Diagnosis: Moderate oral phase dysphagia;Moderate  pharyngeal phase dysphagia  Clinical impression: Moderate oropharyngeal dysphagia  characterized by decreased oral bolus cohesion/control due to  hypoglossal and glossopharyngeal involvement resulting in  premature spillage into pharynx. Pharyngeal swallow was strong  albeit delayed to pyriform sinus and vallecular space resulting  in + audible aspiration of thin liquids - even with chin tuck  posture.  Unfortunately cough is weak and nonprotective and  aspiration appears to cause mild distress.   Chin tuck posture  effectively protects airway with pudding, honey and even nectar.   However given severity of weakness of cough and distress with  aspiration, recommend to start with honey  thick liquids.  Skilled  intervention included education of pt, family to precautions and  reinforcement of strategies.    Pt noted to have secretions retained in airway that he was unable  to clear- therefore recommend oral care after meals.    As pt has  been coughing some with intake at home PTA, suspect  component of chronic aspiration - now with worsened significantly  by this event.      Treatment Recommendation  Therapy as outlined in treatment plan below    Diet Recommendation Honey-thick liquid;Dysphagia 1 (Puree)   Liquid Administration via: Cup Medication Administration: Crushed with puree Supervision: Patient able to self feed;Full supervision/cueing  for compensatory strategies Compensations: Slow rate;Small sips/bites;Check for pocketing Postural Changes and/or Swallow Maneuvers: Seated upright 90  degrees;Chin tuck    Other  Recommendations Recommended Consults: MBS Oral Care Recommendations:  (oral care after meals) Other Recommendations: Order thickener from pharmacy   Follow Up Recommendations  Inpatient Rehab    Frequency and Duration min 2x/week  2 weeks   Pertinent Vitals/Pain Afebrile, decreased    SLP Swallow Goals     General Date of Onset: 12/02/12 HPI: 77 yo male adm to [facility] with slurred speech.  CT head negative  for acute event.  PMH + Belll's Palsy resulting in right facial  weakness (upper and lower), CVA Nov 2010 impairing speech and  with Rt HP.  Pt speaking better at home per family and was  starting to walk.   Type of Study: Modified Barium Swallowing Study Previous Swallow Assessment: bedside eval in 2010 per family, no  info found in epic Diet Prior to this Study: NPO Temperature Spikes Noted: No Respiratory Status: Room air History of Recent Intubation: No Behavior/Cognition: Alert;Cooperative;Pleasant mood Oral Cavity - Dentition:  (four upper teeth, lower dentition  intact, pt has no dentures/partial) Oral Motor / Sensory Function: Impaired - see Bedside swallow  eval  Self-Feeding Abilities: Able to feed self Patient Positioning: Upright in chair Baseline Vocal Quality: Breathy;Low vocal intensity Volitional Cough: Weak Volitional Swallow: Able to elicit Anatomy: Within functional limits Pharyngeal Secretions: Standing secretions in (comment)  (secretions mix with barium, + aspiration of secretions during  MBS)    Reason for Referral   concern for aspiration after suspected  neuro event  Oral Phase Oral Preparation/Oral Phase Oral Phase: Impaired Oral - Honey Oral - Honey Teaspoon: Delayed oral transit;Weak lingual  manipulation;Reduced posterior propulsion Oral - Nectar Oral - Nectar Teaspoon: Reduced posterior propulsion;Weak lingual  manipulation;Delayed oral transit Oral - Nectar Cup: Reduced posterior propulsion;Weak lingual  manipulation;Delayed oral transit Oral - Nectar Straw: Reduced posterior propulsion;Weak lingual  manipulation;Delayed oral transit Oral - Thin Oral - Thin Cup: Weak lingual manipulation;Reduced posterior  propulsion;Delayed oral transit Oral - Solids Oral - Puree: Reduced posterior propulsion;Weak lingual  manipulation;Delayed oral transit Oral - Regular: Reduced posterior propulsion;Weak lingual  manipulation;Delayed oral transit;Impaired mastication Oral Phase - Comment Oral Phase - Comment: chin tuck posture effective for airway  protection with nectar, honey, pudding and cracker - still with +  overt audible asp of thin with chin tuck   Pharyngeal Phase Pharyngeal Phase Pharyngeal Phase: Impaired Pharyngeal - Honey Pharyngeal - Honey Teaspoon: Delayed swallow initiation;Premature  spillage to pyriform sinuses;Premature spillage to valleculae Pharyngeal - Nectar Pharyngeal - Nectar Teaspoon: Delayed swallow  initiation;Premature spillage to valleculae;Premature spillage to  pyriform sinuses Pharyngeal - Nectar Cup: Delayed swallow initiation;Premature  spillage to pyriform sinuses;Premature spillage to  valleculae;Penetration/Aspiration during  swallow Penetration/Aspiration details (nectar cup): Material enters  airway, remains ABOVE vocal cords then ejected out Pharyngeal - Nectar Straw: Delayed swallow initiation;Premature  spillage to valleculae;Premature spillage to pyriform sinuses Pharyngeal - Thin Pharyngeal - Thin Cup: Significant aspiration  (Amount);Penetration/Aspiration during swallow;Delayed swallow  initiation;Premature spillage to valleculae;Premature spillage to  pyriform  sinuses Penetration/Aspiration details (thin cup): Material enters  airway, passes BELOW cords and not ejected out despite cough  attempt by patient Pharyngeal - Solids Pharyngeal - Puree: Penetration/Aspiration during  swallow;Premature spillage to valleculae;Premature spillage to  pyriform sinuses Penetration/Aspiration details (puree): Material enters airway,  remains ABOVE vocal cords then ejected out Pharyngeal - Regular: Delayed swallow initiation;Premature  spillage to valleculae Pharyngeal Phase - Comment Pharyngeal Comment: chin tuck posture effective for airway  protection with nectar, honey, pudding and cracker - still with +  overt audible asp of thin with chin tuck  Cervical Esophageal Phase    GO    Cervical Esophageal Phase Cervical Esophageal Phase: WFL (esophagus appeared clear upon  sweep x2)         Donavan Burnet, MS [facility] SLP [phone]   MRI of the brain-----   1. Scattered foci of restricted diffusion involving the cortex and  subcortical white matter of the right frontal lobe, consistent with  acute ischemic infarcts.  2. Atrophy with chronic small vessel ischemic disease.  3. Remote left cerebellar and bilateral basal ganglia lacunar  infarcts.  MRA of the brain   1. Multi focal stenoses involving the cavernous and supra clinoid  segments of the right internal carotid artery as detailed above,  measuring up to approximately 70% in the supra clinoid segment.  Finding is likely related to underlying chronic atheromatous  disease.  2.  Additional multi focal irregularity within the remaining right  internal carotid artery and posterior cerebral arteries, also likely  related to underlying atherosclerotic disease.  3. Short segment stenosis of approximately 50% within the M1 segment  of the left middle cerebral artery. The left MCA and its branches  are widely patent distally.  4. No intracranial aneurysm identified  2D Echocardiogram  F 55%, wall motion normal. No cardiac source of emboli identified.  Carotid Doppler  Right: 1-39% ICA stenosis. Left: 40-59% ICA stenosis. Vertebral artery flow is antegrade  CXR    EKG  normal sinus rhythm.   Therapy Recommendations home health  Physical Exam   Mental Status:  Alert, oriented, thought content appropriate. Speech moderately slurred without evidence of aphasia. Able to follow commands without difficulty.  Cranial Nerves:  II-Visual fields were normal.  III/IV/VI-Pupils were equal and reacted. Extraocular movements were full and conjugate.  V/VII-no facial numbness; mild right lower facial weakness.  VIII-normal.  X-moderate dysarthria.  Motor: 5/5 bilaterally with normal tone and bulk  Sensory: Slightly reduced perception of tactile sensation over right extremities compared to left extremities.  Deep Tendon Reflexes: 2+ and symmetric.  Plantars: Flexor bilaterally  Cerebellar: Normal finger-to-nose testing.   ASSESSMENT Mr. Casey Collins is a 77 y.o. male presenting with dysarthria, dysphagia. MR Imaging pending, symptoms secondary to unknown source.  On clopidogrel 75 mg orally every day prior to admission. Now on clopidogrel 75 mg orally every day for secondary stroke prevention. Patient with resultant dysarthria, dysphasia. Work up underway.   Diabetes mellitus, type 2, uncontrolled, HGB A1C 11.5  Hyperlipidemia, LDL 88, goal < 70 for diabetics, continue statin  Illicit drug use, cessation counseling  Hypertension  Hospital day #  3  TREATMENT/PLAN  Change to aspirin 325mg  daily (so as to be crushed, until can swallow whole pill, then plavix plus baby aspirin) for secondary stroke prevention.  Home health recommended  Risk factor modification  Dr. Pearlean Brownie discussed plan of care with patient and daughter.  Have patient follow up with Dr. Pearlean Brownie in 2 months in stroke  clinic.  Gwendolyn Lima. Manson Passey, [facility], MBA, MHA [facility] Pager: [phone] 12/04/2012 8:52 AM  I have personally obtained a history, examined the patient, evaluated imaging results, and formulated the assessment and plan of care. I agree with the above. Delia Heady, MD

## 2012-12-04 NOTE — Progress Notes (Signed)
Came to visit at bedside. He gave this Clinical research associate to speak with daughter and son-in-law about [facility] Care Management services. Mr Ging lives with daughter and son-in-law Bonita Quin and Ivan Anchors). Mrs Lucendia Herrlich reports the plan is to go home with home health services. Consents were signed. Explained that [facility] Care Management will not interfere or replace home health services that are arranged. Mr Lucendia Herrlich will receive a post hospital discharge call and will be evaluated for monthly home visits. Left [facility] Care Management packet with daughter and confirmed contact numbers. Appreciative of the visit.  Raiford Noble, MSN-Ed, RN,BSN- [facility] [phone]

## 2012-12-04 NOTE — Progress Notes (Signed)
Patient c/o pain, unable to pinpoint location or pain level. K. Shorr paged. Awaiting on order.   Sim Boast, RN 12/04/12 [phone]

## 2012-12-04 NOTE — Progress Notes (Signed)
Discharge instructions give. Pt verbalized understanding. All questions were answered.

## 2012-12-04 NOTE — Discharge Summary (Signed)
Physician Discharge Summary  Casey Collins:[id number] DOB: [birth year] DOA: 12/01/2012  PCP: Minda Meo, MD  Admit date: 12/01/2012 Discharge date: 12/04/2012  Time spent: 40 minutes  Recommendations for Outpatient Follow-up:  Patient will need to follow up with his primary care physician within one week of discharge. He will be able to continue physical therapy as well as speech therapy as an outpatient with home health services. Patient should be monitored as far as the swelling goes, his Plavix should be resumed once patient is able to swallow the pill. Currently Plavix cannot be crushed. This will need followup. Patient should also follow up with Dr. Pearlean Brownie or his own neurologist within one to 2 months.   Discharge Diagnoses:  Principal Problem:   CVA (cerebral infarction) Active Problems:   HTN (hypertension)   DM (dermatomyositis)   Osteoarthritis   Acute kidney injury   Difficulty swallowing   Discharge Condition: Stable  Diet recommendation: Heart healthy/Dysphage 1 with thickener  Filed Weights   12/01/12 1838  Weight: 90.266 kg (199 lb)    History of present illness: Casey Collins is a 77 y.o. male with Past medical history of diabetes mellitus, hypertension, dyslipidemia, CVA, right-sided weakness and speech issue. The patient presented today with the complaint of progressively worsening right-sided weakness that has been ongoing since this morning. There is no fever, chills, palpitation, dizziness, lightheadedness, trauma, chest pain abdominal pain or nausea or vomiting or diarrhea The symptoms initially happened and resolved within a few minutes and then recurred after half an hour again. He does have history of Bell's palsy. As per the daughter his condition is about the same since came to the ER. Patient denies any urinary symptoms.  Hospital Course:  This is a 77 year old male with a history of diabetes mellitus, hypertension, dyslipidemia, CVA with  right-sided weakness and speech issues. Patient presents emergency department with progressive worsening of his right-sided weakness as well as speech.  Patient was admitted for CVA. He was not a TPA candidate to time constraints. He was admitted neurology as well as speech, occupational and physical therapy were consulted. MRI and carotid Dopplers were also conducted, those results are listed below.  Of note, patient's carotid Doppler did return a left 40-59% ICA stenosis. This will need future followup as well.  Speech therapy evaluted the patient and recommended honey thick dysphasia 1 type of diet. Patient is to use a thickener as well as to crush his pills. Patient's Plavix was held due to the, being able to crush. Patient to continue on full dose aspirin. Once the swelling has improved, patient should be resumed back on his Plavix. He should follow up with his primary care physician as well as his neurologist regarding this. Patient also to follow Dr. Almeta Monas in 2 months. Patient should continue speech therapy as well as physical therapy as an outpatient. He was also seen by our inpatient physical medicine and rehabilitation, and was thought to be a good candidate for home health with physical therapy.    Patient does also complain of difficulty swallowing and complained of a lump in his throat. Ultrasound of the neck was conducted showing a normal thyroid with possible left submandibular gland which appear to be enlarged. Consideration of left sialoadenitis or obstructing silolithiasis.  Patient should also follow up with his primary care physician regarding this.  The patient was noted to have acute kidney injury on admission with a creatinine of 1.6. During his hospital course this has returned to his  baseline. His Avapro was held however may be restarted upon discharge.  As the patient's hypertension as well as diabetes mellitus these problems were stable. Patient was restarted on his metoprolol and  may continue his  once discharged. His diabetes mellitus did remain stable and he was placed on insulin sliding scale.  Procedures: Study Conclusions - Procedure narrative: Transthoracic echocardiography. Image quality was adequate. The study was technically difficult. - Left ventricle: The cavity size was normal. Wall thickness was increased in a pattern of mild LVH. Systolic function was normal. The estimated ejection fraction was in the range of 55% to 60%. Wall motion was normal; there were no regional wall motion abnormalities. Doppler parameters are consistent with abnormal left ventricular relaxation (grade 1 diastolic dysfunction). - Aortic valve: Mild regurgitation. Impressions:- No cardiac source of emboli was indentified.  Carotid Duplex  Preliminary report: Right: 1-39% ICA stenosis. Left: 40-59% ICA stenosis. Vertebral artery flow is antegrade  Consultations: Neurology  Discharge Exam: Filed Vitals:   12/04/12 0923  BP: 163/79  Pulse: 49  Temp: 97.3 F (36.3 C)  Resp: 20    Exam  General: Well developed, well nourished, NAD, appears stated age  HEENT: NCAT, mucous membranes moist. No pharyngeal erythema or exudates  Neck: Supple, no JVD, Nodular density palpated  Cardiovascular: S1 S2 auscultated, 2/6 SEM, Regular rate and rhythm.  Respiratory: Clear to auscultation bilaterally with equal chest rise  Abdomen: Soft, nontender, nondistended, + bowel sounds  Extremities: warm dry without cyanosis clubbing or edema  Neuro: AAOx3, cranial nerves grossly intact. Strength 5/5 in lower and upper extremities  Skin: Without rashes exudates or nodules  Psych: Normal affect and demeanor  Discharge Instructions     Medication List         aspirin 325 MG tablet  Take 1 tablet (325 mg total) by mouth daily.     citalopram 10 MG tablet  Commonly known as:  CELEXA  Take 10 mg by mouth daily.     clopidogrel 75 MG tablet  Commonly known as:  PLAVIX  Take 1  tablet (75 mg total) by mouth daily with breakfast.     LANTUS 100 UNIT/ML injection  Generic drug:  insulin glargine  Inject 10 Units into the skin 2 (two) times daily.     LORazepam 1 MG tablet  Commonly known as:  ATIVAN  Take 1 mg by mouth 2 (two) times daily as needed for anxiety.     metoprolol tartrate 25 MG tablet  Commonly known as:  LOPRESSOR  Take 25 mg by mouth 2 (two) times daily.     NOVOLOG 100 UNIT/ML injection  Generic drug:  insulin aspart  Inject 5 Units into the skin daily. Sliding scale     ramipril 5 MG capsule  Commonly known as:  ALTACE  Take 5 mg by mouth daily.     RESOURCE THICKENUP CLEAR Powd  Take 120 g by mouth as needed.     simvastatin 40 MG tablet  Commonly known as:  ZOCOR  Take 40 mg by mouth at bedtime.     zolpidem 10 MG tablet  Commonly known as:  AMBIEN  Take 10 mg by mouth at bedtime.       No Known Allergies     Follow-up Information   Follow up with ARONSON,RICHARD A, MD In 2 weeks.   Specialty:  Internal Medicine   Contact information:   [address] [facility] [place] [address] [phone]  Follow up with Gates Rigg, MD In 2 months.   Specialties:  Neurology, Radiology   Contact information:   [address] [place] [address] [phone]        The results of significant diagnostics from this hospitalization (including imaging, microbiology, ancillary and laboratory) are listed below for reference.    Significant Diagnostic Studies: Ct Head Wo Contrast  12/01/2012   CLINICAL DATA:  77 year old male code stroke. Slurred speech and difficulty swallowing. Initial encounter.  EXAM: CT HEAD WITHOUT CONTRAST  TECHNIQUE: Contiguous axial images were obtained from the base of the skull through the vertex without intravenous contrast.  COMPARISON:  01/04/2009 and earlier.  FINDINGS: Increased paranasal sinus mucosal thickening and opacification. Mastoids and  tympanic cavities remain clear. No acute osseous abnormality identified. Visualized orbits and scalp soft tissues are within normal limits.  Calcified atherosclerosis at the skull base. Interval evolution of the left coronal radiata lacunar infarct which was subacute on the most recent comparison. Chronic lacunar infarcts in the bilateral deep gray matter nuclei and subcortical white matter. New focus of involvement in the left frontal lobe on series 2, image 26, but appears chronic. Small left inferior cerebellar lacunar infarcts also are new since 2010.  No acute intracranial hemorrhage identified. No midline shift, mass effect, or evidence of intracranial mass lesion. No ventriculomegaly. No evidence of cortically based acute infarction identified. No suspicious intracranial vascular hyperdensity.  IMPRESSION: Chronic small vessel ischemia, with progression of disease since 2010. No acute cortically based infarct or acute intracranial hemorrhage.  Study discussed by telephone with Dr. Amada Jupiter On 12/01/2012 at 19:08 .   Electronically Signed   By: Augusto Gamble M.D.   On: 12/01/2012 19:09   Mr Brain Wo Contrast  12/03/2012   CLINICAL DATA:  Progressive right-sided weakness  EXAM: MRI HEAD WITHOUT CONTRAST  MRA HEAD WITHOUT CONTRAST  TECHNIQUE: Multiplanar, multiecho pulse sequences of the brain and surrounding structures were obtained without intravenous contrast. Angiographic images of the head were obtained using MRA technique without contrast.  COMPARISON:  Prior CT from 12/01/2012.  FINDINGS: MRI HEAD FINDINGS  Scattered foci of T2/FLAIR hyperintensity seen within the periventricular and deep white matter are present, most compatible with chronic small vessel ischemic changes. There is mild atrophy. There is no mass lesion or midline shift. The CSF containing spaces are within normal limits. No hydrocephalus. No extra-axial fluid collection.  Several scattered foci of restricted diffusion are seen involving  the cortex and subcortical white matter of the right frontal lobe (series 5, images 18, 19, 21, 22). These are in the region of the precentral gyrus. The largest of these foci measures 1.5 x 0.7 cm and is somewhat linear and morphology. There is corresponding signal loss on the ADC map. No acute intracranial hemorrhage is seen on the gradient echo sequence.  Remote left cerebellar infarct is noted (Series 9, image 20). Small remote lacunar infarcts are also present within the basal ganglia and periventricular white matter bilaterally.  The craniocervical junction is normal. Corpus callosum is within normal limits. Pituitary gland demonstrates a normal appearance. The pituitary stalk is midline.  There is near-complete opacification of the right maxillary sinus with partial opacification of the left maxillary sinus. Scattered fluid density is seen within the ethmoidal air cells bilaterally. There is no mastoid effusion.  MRA HEAD FINDINGS  3D time-of-flight imaging of the intracranial circulation demonstrates a short segment stenosis of approximately 50% by NASCET criteria within the cavernous segment of the right  internal carotid artery (series 3, image 61). Additional multi focal irregularity is seen throughout the cavernous portion of the right ICA, likely related to underlying atherosclerotic disease. There is additional asymmetric focal narrowing of the right internal carotid artery involving its supra clinoid segment of approximately 70% (series 3, image 76). Distally, the right middle cerebral artery is widely patent with antegrade flow. The right middle cerebral artery branches are opacified. The left internal carotid artery is mildly irregular without high-grade stenosis. A1 segments are regular but widely patent and symmetric in appearance. Anterior communicating artery is within normal limits. Anterior cerebral arteries are symmetric in caliber with antegrade flow. Short-segment stenosis of approximately  50% is seen within the M1 segment of the left middle cerebral artery (series 3, image 92). The left middle cerebral artery and its branches are widely patent distally.  The vertebral arteries are patent with antegrade flow. The left vertebral artery is diminutive as compared to the right. The origins of the posterior inferior cerebellar arteries are within normal limits. The vertebrobasilar junction, basilar artery, and basilar tip are within normal limits. Multi focal irregularity is seen within the posterior cerebral arteries, with slightly decreased flow seen within the distal branches of the right posterior cerebral artery. The superior cerebellar arteries are grossly normal. Posterior communicating arteries are not well visualized on this examination.  That no intracranial aneurysm identified.  IMPRESSION: MRI HEAD:  1. Scattered foci of restricted diffusion involving the cortex and subcortical white matter of the right frontal lobe, consistent with acute ischemic infarcts. 2. Atrophy with chronic small vessel ischemic disease. 3. Remote left cerebellar and bilateral basal ganglia lacunar infarcts. MRA HEAD:  1. Multi focal stenoses involving the cavernous and supra clinoid segments of the right internal carotid artery as detailed above, measuring up to approximately 70% in the supra clinoid segment. Finding is likely related to underlying chronic atheromatous disease. 2. Additional multi focal irregularity within the remaining right internal carotid artery and posterior cerebral arteries, also likely related to underlying atherosclerotic disease. 3. Short segment stenosis of approximately 50% within the M1 segment of the left middle cerebral artery. The left MCA and its branches are widely patent distally. 4. No intracranial aneurysm identified.   Electronically Signed   By: Rise Mu M.D.   On: 12/03/2012 23:48   US Soft Tissue Head/neck  12/02/2012   CLINICAL DATA:  77-year- male with  difficulty swallowing and palpable abnormality in the neck.  EXAM: THYROID ULTRASOUND  TECHNIQUE: Ultrasound examination of the thyroid gland and adjacent soft tissues was performed.  COMPARISON:  Head CT 12/01/2012.  FINDINGS: Right thyroid lobe  Measurements: 3.7 x 1.4 x 2.0 cm.  No nodules visualized.  Left thyroid lobe  Measurements: 3.6 x 1.6 x 1.8 cm.  No nodules visualized.  Isthmus  Thickness: 6 mm.  No nodules visualized.  Lymphadenopathy  None visualized.  Palpable area of concern was imaged an seems to correspond to the left submandibular gland. Contralateral images at the same level demonstrate the right submandibular gland with similar echotexture. The left gland may be mildly enlarged and with mild central ductal ectasia.  IMPRESSION: 1.  Normal thyroid.  2. Palpable area of concern seems to correspond to the left submandibular gland which appears mildly asymmetrically enlarged compared to the right SMG. Consider left sialoadenitis or obstructing silolithiasis.   Electronically Signed   By: Augusto Gamble M.D.   On: 12/02/2012 22:53   Dg Swallowing Func-speech Pathology  12/02/2012   Chales Abrahams,  CCC-SLP     12/02/2012 10:01 AM Objective Swallowing Evaluation: Modified Barium Swallowing Study   Patient Details  Name: SHOJI PERTUIT MRN: [id number] Date of Birth: 04-05-1935  Today's Date: 12/02/2012 Time: 0905-0925 SLP Time Calculation (min): 20 min  Past Medical History:  Past Medical History  Diagnosis Date  . Stroke     Right side   . Diabetes mellitus   . Arthritis   . Hypertension   . Hyperlipemia   . Diverticulosis   . Colon polyp    Past Surgical History:  Past Surgical History  Procedure Laterality Date  . Bilateral inguinal hernia     HPI:  77 yo male adm to [facility] with slurred speech.  CT head negative for  acute event.  PMH + Belll's Palsy resulting in right facial  weakness (upper and lower), CVA Nov 2010 impairing speech and  with Rt HP.  Pt speaking better at home per family and was   starting to walk.       Assessment / Plan / Recommendation Clinical Impression  Dysphagia Diagnosis: Moderate oral phase dysphagia;Moderate  pharyngeal phase dysphagia  Clinical impression: Moderate oropharyngeal dysphagia  characterized by decreased oral bolus cohesion/control due to  hypoglossal and glossopharyngeal involvement resulting in  premature spillage into pharynx. Pharyngeal swallow was strong  albeit delayed to pyriform sinus and vallecular space resulting  in + audible aspiration of thin liquids - even with chin tuck  posture.  Unfortunately cough is weak and nonprotective and  aspiration appears to cause mild distress.   Chin tuck posture  effectively protects airway with pudding, honey and even nectar.   However given severity of weakness of cough and distress with  aspiration, recommend to start with honey thick liquids.  Skilled  intervention included education of pt, family to precautions and  reinforcement of strategies.    Pt noted to have secretions retained in airway that he was unable  to clear- therefore recommend oral care after meals.    As pt has been coughing some with intake at home PTA, suspect  component of chronic aspiration - now with worsened significantly  by this event.      Treatment Recommendation  Therapy as outlined in treatment plan below    Diet Recommendation Honey-thick liquid;Dysphagia 1 (Puree)   Liquid Administration via: Cup Medication Administration: Crushed with puree Supervision: Patient able to self feed;Full supervision/cueing  for compensatory strategies Compensations: Slow rate;Small sips/bites;Check for pocketing Postural Changes and/or Swallow Maneuvers: Seated upright 90  degrees;Chin tuck    Other  Recommendations Recommended Consults: MBS Oral Care Recommendations:  (oral care after meals) Other Recommendations: Order thickener from pharmacy   Follow Up Recommendations  Inpatient Rehab    Frequency and Duration min 2x/week  2 weeks   Pertinent Vitals/Pain  Afebrile, decreased    SLP Swallow Goals     General Date of Onset: 12/02/12 HPI: 77 yo male adm to [facility] with slurred speech.  CT head negative  for acute event.  PMH + Belll's Palsy resulting in right facial  weakness (upper and lower), CVA Nov 2010 impairing speech and  with Rt HP.  Pt speaking better at home per family and was  starting to walk.   Type of Study: Modified Barium Swallowing Study Previous Swallow Assessment: bedside eval in 2010 per family, no  info found in epic Diet Prior to this Study: NPO Temperature Spikes Noted: No Respiratory Status: Room air History of Recent Intubation: No Behavior/Cognition: Alert;Cooperative;Pleasant mood Oral Cavity -  Dentition:  (four upper teeth, lower dentition  intact, pt has no dentures/partial) Oral Motor / Sensory Function: Impaired - see Bedside swallow  eval Self-Feeding Abilities: Able to feed self Patient Positioning: Upright in chair Baseline Vocal Quality: Breathy;Low vocal intensity Volitional Cough: Weak Volitional Swallow: Able to elicit Anatomy: Within functional limits Pharyngeal Secretions: Standing secretions in (comment)  (secretions mix with barium, + aspiration of secretions during  MBS)    Reason for Referral   concern for aspiration after suspected  neuro event  Oral Phase Oral Preparation/Oral Phase Oral Phase: Impaired Oral - Honey Oral - Honey Teaspoon: Delayed oral transit;Weak lingual  manipulation;Reduced posterior propulsion Oral - Nectar Oral - Nectar Teaspoon: Reduced posterior propulsion;Weak lingual  manipulation;Delayed oral transit Oral - Nectar Cup: Reduced posterior propulsion;Weak lingual  manipulation;Delayed oral transit Oral - Nectar Straw: Reduced posterior propulsion;Weak lingual  manipulation;Delayed oral transit Oral - Thin Oral - Thin Cup: Weak lingual manipulation;Reduced posterior  propulsion;Delayed oral transit Oral - Solids Oral - Puree: Reduced posterior propulsion;Weak lingual  manipulation;Delayed oral transit Oral  - Regular: Reduced posterior propulsion;Weak lingual  manipulation;Delayed oral transit;Impaired mastication Oral Phase - Comment Oral Phase - Comment: chin tuck posture effective for airway  protection with nectar, honey, pudding and cracker - still with +  overt audible asp of thin with chin tuck   Pharyngeal Phase Pharyngeal Phase Pharyngeal Phase: Impaired Pharyngeal - Honey Pharyngeal - Honey Teaspoon: Delayed swallow initiation;Premature  spillage to pyriform sinuses;Premature spillage to valleculae Pharyngeal - Nectar Pharyngeal - Nectar Teaspoon: Delayed swallow  initiation;Premature spillage to valleculae;Premature spillage to  pyriform sinuses Pharyngeal - Nectar Cup: Delayed swallow initiation;Premature  spillage to pyriform sinuses;Premature spillage to  valleculae;Penetration/Aspiration during swallow Penetration/Aspiration details (nectar cup): Material enters  airway, remains ABOVE vocal cords then ejected out Pharyngeal - Nectar Straw: Delayed swallow initiation;Premature  spillage to valleculae;Premature spillage to pyriform sinuses Pharyngeal - Thin Pharyngeal - Thin Cup: Significant aspiration  (Amount);Penetration/Aspiration during swallow;Delayed swallow  initiation;Premature spillage to valleculae;Premature spillage to  pyriform sinuses Penetration/Aspiration details (thin cup): Material enters  airway, passes BELOW cords and not ejected out despite cough  attempt by patient Pharyngeal - Solids Pharyngeal - Puree: Penetration/Aspiration during  swallow;Premature spillage to valleculae;Premature spillage to  pyriform sinuses Penetration/Aspiration details (puree): Material enters airway,  remains ABOVE vocal cords then ejected out Pharyngeal - Regular: Delayed swallow initiation;Premature  spillage to valleculae Pharyngeal Phase - Comment Pharyngeal Comment: chin tuck posture effective for airway  protection with nectar, honey, pudding and cracker - still with +  overt audible asp of thin with  chin tuck  Cervical Esophageal Phase    GO    Cervical Esophageal Phase Cervical Esophageal Phase: WFL (esophagus appeared clear upon  sweep x2)         Donavan Burnet, MS [facility] SLP [phone]    Mr Maxine Glenn Head/brain Wo Cm  12/03/2012   CLINICAL DATA:  Progressive right-sided weakness  EXAM: MRI HEAD WITHOUT CONTRAST  MRA HEAD WITHOUT CONTRAST  TECHNIQUE: Multiplanar, multiecho pulse sequences of the brain and surrounding structures were obtained without intravenous contrast. Angiographic images of the head were obtained using MRA technique without contrast.  COMPARISON:  Prior CT from 12/01/2012.  FINDINGS: MRI HEAD FINDINGS  Scattered foci of T2/FLAIR hyperintensity seen within the periventricular and deep white matter are present, most compatible with chronic small vessel ischemic changes. There is mild atrophy. There is no mass lesion or midline shift. The CSF containing spaces are within normal limits. No hydrocephalus. No extra-axial  fluid collection.  Several scattered foci of restricted diffusion are seen involving the cortex and subcortical white matter of the right frontal lobe (series 5, images 18, 19, 21, 22). These are in the region of the precentral gyrus. The largest of these foci measures 1.5 x 0.7 cm and is somewhat linear and morphology. There is corresponding signal loss on the ADC map. No acute intracranial hemorrhage is seen on the gradient echo sequence.  Remote left cerebellar infarct is noted (Series 9, image 20). Small remote lacunar infarcts are also present within the basal ganglia and periventricular white matter bilaterally.  The craniocervical junction is normal. Corpus callosum is within normal limits. Pituitary gland demonstrates a normal appearance. The pituitary stalk is midline.  There is near-complete opacification of the right maxillary sinus with partial opacification of the left maxillary sinus. Scattered fluid density is seen within the ethmoidal air cells bilaterally. There is no  mastoid effusion.  MRA HEAD FINDINGS  3D time-of-flight imaging of the intracranial circulation demonstrates a short segment stenosis of approximately 50% by NASCET criteria within the cavernous segment of the right internal carotid artery (series 3, image 61). Additional multi focal irregularity is seen throughout the cavernous portion of the right ICA, likely related to underlying atherosclerotic disease. There is additional asymmetric focal narrowing of the right internal carotid artery involving its supra clinoid segment of approximately 70% (series 3, image 76). Distally, the right middle cerebral artery is widely patent with antegrade flow. The right middle cerebral artery branches are opacified. The left internal carotid artery is mildly irregular without high-grade stenosis. A1 segments are regular but widely patent and symmetric in appearance. Anterior communicating artery is within normal limits. Anterior cerebral arteries are symmetric in caliber with antegrade flow. Short-segment stenosis of approximately 50% is seen within the M1 segment of the left middle cerebral artery (series 3, image 92). The left middle cerebral artery and its branches are widely patent distally.  The vertebral arteries are patent with antegrade flow. The left vertebral artery is diminutive as compared to the right. The origins of the posterior inferior cerebellar arteries are within normal limits. The vertebrobasilar junction, basilar artery, and basilar tip are within normal limits. Multi focal irregularity is seen within the posterior cerebral arteries, with slightly decreased flow seen within the distal branches of the right posterior cerebral artery. The superior cerebellar arteries are grossly normal. Posterior communicating arteries are not well visualized on this examination.  That no intracranial aneurysm identified.  IMPRESSION: MRI HEAD:  1. Scattered foci of restricted diffusion involving the cortex and subcortical  white matter of the right frontal lobe, consistent with acute ischemic infarcts. 2. Atrophy with chronic small vessel ischemic disease. 3. Remote left cerebellar and bilateral basal ganglia lacunar infarcts. MRA HEAD:  1. Multi focal stenoses involving the cavernous and supra clinoid segments of the right internal carotid artery as detailed above, measuring up to approximately 70% in the supra clinoid segment. Finding is likely related to underlying chronic atheromatous disease. 2. Additional multi focal irregularity within the remaining right internal carotid artery and posterior cerebral arteries, also likely related to underlying atherosclerotic disease. 3. Short segment stenosis of approximately 50% within the M1 segment of the left middle cerebral artery. The left MCA and its branches are widely patent distally. 4. No intracranial aneurysm identified.   Electronically Signed   By: Rise Mu M.D.   On: 12/03/2012 23:48    Microbiology: No results found for this or any previous visit (from the  past 240 hour(s)).   Labs: Basic Metabolic Panel:  Recent Labs Lab 12/01/12 1905 12/02/12 0638  NA 143 139  K 5.1 4.5  CL 105 106  CO2  --  25  GLUCOSE 85 178*  BUN 29* 23  CREATININE 1.60* 1.24  CALCIUM  --  9.2   Liver Function Tests:  Recent Labs Lab 12/02/12 0638  AST 21  ALT 15  ALKPHOS 64  BILITOT 0.4  PROT 6.7  ALBUMIN 3.8   No results found for this basename: LIPASE, AMYLASE,  in the last 168 hours No results found for this basename: AMMONIA,  in the last 168 hours CBC:  Recent Labs Lab 12/01/12 1850 12/01/12 1905 12/02/12 0638  WBC 9.9  --  7.8  NEUTROABS 5.1  --  6.9  HGB 14.3 15.0 14.0  HCT 41.9 44.0 40.7  MCV 93.3  --  91.9  PLT 225  --  192   Cardiac Enzymes:  Recent Labs Lab 12/01/12 1903  TROPONINI <0.30   BNP: BNP (last 3 results) No results found for this basename: PROBNP,  in the last 8760 hours CBG:  Recent Labs Lab 12/03/12 1107  12/03/12 1618 12/03/12 2155 12/04/12 0108 12/04/12 0656  GLUCAP 206* 217* 146* 118* 110*       Signed:  Dawnya Grams  [facility] 12/04/2012, 10:13 AM

## 2012-12-04 NOTE — Care Management Note (Unsigned)
    Page 1 of 1   12/04/2012     10:37:54 AM   CARE MANAGEMENT NOTE 12/04/2012  Patient:  Casey Collins, Casey Collins   Account Number:  [id number]  Date Initiated:  12/04/2012  Documentation initiated by:  Elmer Bales  Subjective/Objective Assessment:   Patient admitted for CVA. Lives at home with children     Action/Plan:   will follow for discharge needs   Anticipated DC Date:  12/04/2012   Anticipated DC Plan:  HOME W HOME HEALTH SERVICES      DC Planning Services  CM consult      Choice offered to / List presented to:  C-4 Adult Children        HH arranged  HH-2 PT  HH-5 SPEECH THERAPY      HH agency  [facility]   Status of service:  Completed, signed off Medicare Important Message given?   (If response is "NO", the following Medicare IM given date fields will be blank) Date Medicare IM given:   Date Additional Medicare IM given:    Discharge Disposition:    Per UR Regulation:  Reviewed for med. necessity/level of care/duration of stay  If discussed at Long Length of Stay Meetings, dates discussed:    Comments:  12/04/12 1015 Elmer Bales RN, MSN, CM- Met with patient's daughter to discuss home health therapies.  Per Dr Catha Gosselin, home health PT and SLP will be ordered at discharge.  Patient's daughter is agreeable to this and has chosen [facility], which they have used in the past.  Corrie Dandy with [facility] was notified and accepted the referral.  Patient does not have any equipment needs.  Patient will be discharging home with daughter/son-in-law Bonita Quin and [name]). Primary contact number is [phone].

## 2012-12-04 NOTE — Progress Notes (Signed)
Seen and agreed 12/04/2012 Robinette, Julia Elizabeth PTA [phone] pager [phone] office    

## 2012-12-06 DIAGNOSIS — I10 Essential (primary) hypertension: Secondary | ICD-10-CM | POA: Diagnosis not present

## 2012-12-06 DIAGNOSIS — E119 Type 2 diabetes mellitus without complications: Secondary | ICD-10-CM | POA: Diagnosis not present

## 2012-12-06 DIAGNOSIS — I69920 Aphasia following unspecified cerebrovascular disease: Secondary | ICD-10-CM | POA: Diagnosis not present

## 2012-12-06 DIAGNOSIS — Z5189 Encounter for other specified aftercare: Secondary | ICD-10-CM | POA: Diagnosis not present

## 2012-12-06 DIAGNOSIS — M159 Polyosteoarthritis, unspecified: Secondary | ICD-10-CM | POA: Diagnosis not present

## 2012-12-06 DIAGNOSIS — Z794 Long term (current) use of insulin: Secondary | ICD-10-CM | POA: Diagnosis not present

## 2012-12-06 DIAGNOSIS — I69991 Dysphagia following unspecified cerebrovascular disease: Secondary | ICD-10-CM | POA: Diagnosis not present

## 2012-12-07 DIAGNOSIS — I69991 Dysphagia following unspecified cerebrovascular disease: Secondary | ICD-10-CM | POA: Diagnosis not present

## 2012-12-07 DIAGNOSIS — M159 Polyosteoarthritis, unspecified: Secondary | ICD-10-CM | POA: Diagnosis not present

## 2012-12-07 DIAGNOSIS — I10 Essential (primary) hypertension: Secondary | ICD-10-CM | POA: Diagnosis not present

## 2012-12-07 DIAGNOSIS — E119 Type 2 diabetes mellitus without complications: Secondary | ICD-10-CM | POA: Diagnosis not present

## 2012-12-07 DIAGNOSIS — I69920 Aphasia following unspecified cerebrovascular disease: Secondary | ICD-10-CM | POA: Diagnosis not present

## 2012-12-07 DIAGNOSIS — Z5189 Encounter for other specified aftercare: Secondary | ICD-10-CM | POA: Diagnosis not present

## 2012-12-07 NOTE — ED Provider Notes (Signed)
CSN: [id number]     Arrival date & time 12/01/12  1826 History   First MD Initiated Contact with Patient 12/01/12 1844     Chief Complaint  Patient presents with  . Aphasia   (Consider location/radiation/quality/duration/timing/severity/associated sxs/prior Treatment) HPI Comments: 77 y.o. male a history of diabetes mellitus, hypertension, hyperlipidemia and previous stroke, presenting with new onset speech difficulty which started at noon today. Patient had difficulty with swallowing in addition to forming words. He was noted to have right facial droop and increased weakness involving right extremities. Also has slurred speech.  The history is provided by the patient and medical records.    Past Medical History  Diagnosis Date  . Stroke     Right side   . Diabetes mellitus   . Arthritis   . Hypertension   . Hyperlipemia   . Diverticulosis   . Colon polyp    Past Surgical History  Procedure Laterality Date  . Bilateral inguinal hernia     Family History  Problem Relation Age of Onset  . Colon cancer Neg Hx   . Prostate cancer Brother   . Diabetes Daughter    History  Substance Use Topics  . Smoking status: Never Smoker   . Smokeless tobacco: Never Used  . Alcohol Use: No    Review of Systems  Constitutional: Negative for fever, chills and activity change.  Eyes: Negative for visual disturbance.  Respiratory: Negative for cough, chest tightness and shortness of breath.   Cardiovascular: Negative for chest pain.  Gastrointestinal: Negative for abdominal distention.  Genitourinary: Negative for dysuria, enuresis and difficulty urinating.  Musculoskeletal: Negative for arthralgias and neck pain.  Neurological: Positive for seizures, weakness and numbness. Negative for dizziness, light-headedness and headaches.  Psychiatric/Behavioral: Negative for confusion.    Allergies  Review of patient's allergies indicates no known allergies.  Home Medications   Current  Outpatient Rx  Name  Route  Sig  Dispense  Refill  . citalopram (CELEXA) 10 MG tablet   Oral   Take 10 mg by mouth daily.          . insulin aspart (NOVOLOG) 100 UNIT/ML injection   Subcutaneous   Inject 5 Units into the skin daily. Sliding scale         . insulin glargine (LANTUS) 100 UNIT/ML injection   Subcutaneous   Inject 10 Units into the skin 2 (two) times daily.          Marland Kitchen LORazepam (ATIVAN) 1 MG tablet   Oral   Take 1 mg by mouth 2 (two) times daily as needed for anxiety.         . metoprolol tartrate (LOPRESSOR) 25 MG tablet   Oral   Take 25 mg by mouth 2 (two) times daily.          . ramipril (ALTACE) 5 MG capsule   Oral   Take 5 mg by mouth daily.          . simvastatin (ZOCOR) 40 MG tablet   Oral   Take 40 mg by mouth at bedtime.          Marland Kitchen zolpidem (AMBIEN) 10 MG tablet   Oral   Take 10 mg by mouth at bedtime.          Marland Kitchen aspirin 325 MG tablet   Oral   Take 1 tablet (325 mg total) by mouth daily.   30 tablet   0   . clopidogrel (PLAVIX) 75 MG tablet  Oral   Take 1 tablet (75 mg total) by mouth daily with breakfast.   30 tablet   0     Resume once able to swallow pill whole, and not cr ...   . Maltodextrin-Xanthan Gum (RESOURCE THICKENUP CLEAR) POWD   Oral   Take 120 g by mouth as needed.   30 Can   0    BP 157/62  Pulse 48  Temp(Src) 97.4 F (36.3 C) (Oral)  Resp 18  Ht 5\' 10"  (1.778 m)  Wt 199 lb (90.266 kg)  BMI 28.55 kg/m2  SpO2 97% Physical Exam  Vitals reviewed. Constitutional: He is oriented to person, place, and time. He appears well-developed.  HENT:  Head: Normocephalic and atraumatic.  Eyes: Conjunctivae and EOM are normal. Pupils are equal, round, and reactive to light.  Neck: Normal range of motion. Neck supple.  Cardiovascular: Normal rate and regular rhythm.   Pulmonary/Chest: Effort normal and breath sounds normal.  Abdominal: Soft. Bowel sounds are normal. He exhibits no distension. There is no  tenderness. There is no rebound and no guarding.  Neurological: He is alert and oriented to person, place, and time.  Slurred speech, right sided facial droop.  Skin: Skin is warm.    ED Course  Procedures (including critical care time) Labs Review Labs Reviewed  GLUCOSE, CAPILLARY - Abnormal; Notable for the following:    Glucose-Capillary 113 (*)    All other components within normal limits  HEMOGLOBIN A1C - Abnormal; Notable for the following:    Hemoglobin A1C 5.9 (*)    Mean Plasma Glucose 123 (*)    All other components within normal limits  LIPID PANEL - Abnormal; Notable for the following:    HDL 34 (*)    All other components within normal limits  CBC WITH DIFFERENTIAL - Abnormal; Notable for the following:    Neutrophils Relative % 88 (*)    Lymphocytes Relative 11 (*)    Monocytes Relative 1 (*)    All other components within normal limits  COMPREHENSIVE METABOLIC PANEL - Abnormal; Notable for the following:    Glucose, Bld 178 (*)    GFR calc non Af Amer 55 (*)    GFR calc Af Amer 64 (*)    All other components within normal limits  GLUCOSE, CAPILLARY - Abnormal; Notable for the following:    Glucose-Capillary 170 (*)    All other components within normal limits  GLUCOSE, CAPILLARY - Abnormal; Notable for the following:    Glucose-Capillary 205 (*)    All other components within normal limits  GLUCOSE, CAPILLARY - Abnormal; Notable for the following:    Glucose-Capillary 190 (*)    All other components within normal limits  GLUCOSE, CAPILLARY - Abnormal; Notable for the following:    Glucose-Capillary 248 (*)    All other components within normal limits  GLUCOSE, CAPILLARY - Abnormal; Notable for the following:    Glucose-Capillary 215 (*)    All other components within normal limits  GLUCOSE, CAPILLARY - Abnormal; Notable for the following:    Glucose-Capillary 163 (*)    All other components within normal limits  GLUCOSE, CAPILLARY - Abnormal; Notable for  the following:    Glucose-Capillary 206 (*)    All other components within normal limits  GLUCOSE, CAPILLARY - Abnormal; Notable for the following:    Glucose-Capillary 217 (*)    All other components within normal limits  GLUCOSE, CAPILLARY - Abnormal; Notable for the following:    Glucose-Capillary 146 (*)  All other components within normal limits  GLUCOSE, CAPILLARY - Abnormal; Notable for the following:    Glucose-Capillary 118 (*)    All other components within normal limits  GLUCOSE, CAPILLARY - Abnormal; Notable for the following:    Glucose-Capillary 110 (*)    All other components within normal limits  GLUCOSE, CAPILLARY - Abnormal; Notable for the following:    Glucose-Capillary 107 (*)    All other components within normal limits  POCT I-STAT, CHEM 8 - Abnormal; Notable for the following:    BUN 29 (*)    Creatinine, Ser 1.60 (*)    All other components within normal limits  GLUCOSE, CAPILLARY  TROPONIN I  URINALYSIS, ROUTINE W REFLEX MICROSCOPIC  CBC WITH DIFFERENTIAL  GLUCOSE, CAPILLARY  GLUCOSE, CAPILLARY   Imaging Review No results found.  EKG Interpretation     Ventricular Rate:  52 PR Interval:    QRS Duration: 122 QT Interval:  490 QTC Calculation: 456 R Axis:   61 Text Interpretation:  AV dissociation Probable left atrial enlargement Nonspecific intraventricular conduction delay OR   JUNCTIONAL RHYTHM            MDM   1. CVA (cerebral infarction)   2. DM (dermatomyositis)   3. HTN (hypertension)   4. Acute ischemic stroke   5. H/O: CVA (cerebrovascular accident)   6. Difficulty swallowing   7. Acute kidney injury   8. Osteoarthritis   9. Chronic anticoagulation   10. Diverticulosis   11. Hx of adenomatous colonic polyps   12. Internal hemorrhoids    EKG Interpretation     Ventricular Rate:  52 PR Interval:    QRS Duration: 122 QT Interval:  490 QTC Calculation: 456 R Axis:   61 Text Interpretation:  AV dissociation  Probable left atrial enlargement Nonspecific intraventricular conduction delay OR   JUNCTIONAL RHYTHM            DDx includes:  Stroke - ischemic vs. hemorrhagic TIA Neuropathy Myelitis Electrolyte abnormality Neuropathy Muscular disease  Pt with slurred speech, right sided facial droop. Concerns for stroke. Last normal unknown. Not a TPA candidate.   Derwood Kaplan, MD 12/07/12 2358

## 2012-12-08 DIAGNOSIS — M159 Polyosteoarthritis, unspecified: Secondary | ICD-10-CM | POA: Diagnosis not present

## 2012-12-08 DIAGNOSIS — E119 Type 2 diabetes mellitus without complications: Secondary | ICD-10-CM | POA: Diagnosis not present

## 2012-12-08 DIAGNOSIS — I69991 Dysphagia following unspecified cerebrovascular disease: Secondary | ICD-10-CM | POA: Diagnosis not present

## 2012-12-08 DIAGNOSIS — I69920 Aphasia following unspecified cerebrovascular disease: Secondary | ICD-10-CM | POA: Diagnosis not present

## 2012-12-08 DIAGNOSIS — I10 Essential (primary) hypertension: Secondary | ICD-10-CM | POA: Diagnosis not present

## 2012-12-08 DIAGNOSIS — Z5189 Encounter for other specified aftercare: Secondary | ICD-10-CM | POA: Diagnosis not present

## 2012-12-09 ENCOUNTER — Inpatient Hospital Stay (HOSPITAL_COMMUNITY)
Admission: EM | Admit: 2012-12-09 | Discharge: 2012-12-11 | DRG: 065 | Discharge status: Against Medical Advice | Payer: Medicare Other | Attending: Internal Medicine | Admitting: Internal Medicine

## 2012-12-09 ENCOUNTER — Observation Stay (HOSPITAL_COMMUNITY): Payer: Medicare Other

## 2012-12-09 ENCOUNTER — Emergency Department (HOSPITAL_COMMUNITY): Payer: Medicare Other

## 2012-12-09 ENCOUNTER — Encounter (HOSPITAL_COMMUNITY): Payer: Self-pay | Admitting: Emergency Medicine

## 2012-12-09 DIAGNOSIS — E860 Dehydration: Secondary | ICD-10-CM | POA: Diagnosis present

## 2012-12-09 DIAGNOSIS — Z7982 Long term (current) use of aspirin: Secondary | ICD-10-CM | POA: Diagnosis not present

## 2012-12-09 DIAGNOSIS — R4701 Aphasia: Secondary | ICD-10-CM | POA: Diagnosis present

## 2012-12-09 DIAGNOSIS — N289 Disorder of kidney and ureter, unspecified: Secondary | ICD-10-CM | POA: Diagnosis present

## 2012-12-09 DIAGNOSIS — I6350 Cerebral infarction due to unspecified occlusion or stenosis of unspecified cerebral artery: Secondary | ICD-10-CM | POA: Diagnosis not present

## 2012-12-09 DIAGNOSIS — E785 Hyperlipidemia, unspecified: Secondary | ICD-10-CM | POA: Diagnosis present

## 2012-12-09 DIAGNOSIS — E875 Hyperkalemia: Secondary | ICD-10-CM | POA: Diagnosis not present

## 2012-12-09 DIAGNOSIS — G459 Transient cerebral ischemic attack, unspecified: Secondary | ICD-10-CM

## 2012-12-09 DIAGNOSIS — Z8673 Personal history of transient ischemic attack (TIA), and cerebral infarction without residual deficits: Secondary | ICD-10-CM

## 2012-12-09 DIAGNOSIS — Z794 Long term (current) use of insulin: Secondary | ICD-10-CM

## 2012-12-09 DIAGNOSIS — M3390 Dermatopolymyositis, unspecified, organ involvement unspecified: Secondary | ICD-10-CM | POA: Diagnosis present

## 2012-12-09 DIAGNOSIS — R4702 Dysphasia: Secondary | ICD-10-CM

## 2012-12-09 DIAGNOSIS — M3313 Other dermatomyositis without myopathy: Secondary | ICD-10-CM | POA: Diagnosis present

## 2012-12-09 DIAGNOSIS — R4789 Other speech disturbances: Secondary | ICD-10-CM

## 2012-12-09 DIAGNOSIS — R1310 Dysphagia, unspecified: Secondary | ICD-10-CM

## 2012-12-09 DIAGNOSIS — Z79899 Other long term (current) drug therapy: Secondary | ICD-10-CM | POA: Diagnosis not present

## 2012-12-09 DIAGNOSIS — E119 Type 2 diabetes mellitus without complications: Secondary | ICD-10-CM | POA: Diagnosis present

## 2012-12-09 DIAGNOSIS — I10 Essential (primary) hypertension: Secondary | ICD-10-CM | POA: Diagnosis present

## 2012-12-09 DIAGNOSIS — Z7901 Long term (current) use of anticoagulants: Secondary | ICD-10-CM

## 2012-12-09 DIAGNOSIS — R0989 Other specified symptoms and signs involving the circulatory and respiratory systems: Secondary | ICD-10-CM | POA: Diagnosis not present

## 2012-12-09 DIAGNOSIS — N179 Acute kidney failure, unspecified: Secondary | ICD-10-CM

## 2012-12-09 DIAGNOSIS — I639 Cerebral infarction, unspecified: Secondary | ICD-10-CM

## 2012-12-09 DIAGNOSIS — R1312 Dysphagia, oropharyngeal phase: Secondary | ICD-10-CM | POA: Diagnosis present

## 2012-12-09 LAB — URINALYSIS, ROUTINE W REFLEX MICROSCOPIC
Glucose, UA: NEGATIVE mg/dL
Ketones, ur: NEGATIVE mg/dL
Leukocytes, UA: NEGATIVE
Nitrite: NEGATIVE
Specific Gravity, Urine: 1.02 (ref 1.005–1.030)
pH: 7.5 (ref 5.0–8.0)

## 2012-12-09 LAB — COMPREHENSIVE METABOLIC PANEL
ALT: 28 U/L (ref 0–53)
AST: 25 U/L (ref 0–37)
Albumin: 3.8 g/dL (ref 3.5–5.2)
Alkaline Phosphatase: 72 U/L (ref 39–117)
BUN: 26 mg/dL — ABNORMAL HIGH (ref 6–23)
CO2: 27 mEq/L (ref 19–32)
Calcium: 9.4 mg/dL (ref 8.4–10.5)
Chloride: 101 mEq/L (ref 96–112)
GFR calc Af Amer: 45 mL/min — ABNORMAL LOW (ref >90)
GFR calc non Af Amer: 38 mL/min — ABNORMAL LOW (ref >90)
Glucose, Bld: 106 mg/dL — ABNORMAL HIGH (ref 70–99)
Potassium: 5.6 mEq/L — ABNORMAL HIGH (ref 3.5–5.1)
Sodium: 136 mEq/L (ref 135–145)
Total Protein: 7.2 g/dL (ref 6.0–8.3)

## 2012-12-09 LAB — DIFFERENTIAL
Basophils Relative: 0 % (ref 0–1)
Eosinophils Absolute: 0.4 10*3/uL (ref 0.0–0.7)
Eosinophils Relative: 4 % (ref 0–5)
Lymphocytes Relative: 25 % (ref 12–46)
Lymphs Abs: 2.6 10*3/uL (ref 0.7–4.0)
Monocytes Relative: 11 % (ref 3–12)
Neutro Abs: 6.1 10*3/uL (ref 1.7–7.7)

## 2012-12-09 LAB — POCT I-STAT, CHEM 8
BUN: 28 mg/dL — ABNORMAL HIGH (ref 6–23)
Chloride: 103 mEq/L (ref 96–112)
HCT: 42 % (ref 39.0–52.0)
Potassium: 5.5 mEq/L — ABNORMAL HIGH (ref 3.5–5.1)
Sodium: 139 mEq/L (ref 135–145)

## 2012-12-09 LAB — CBC
HCT: 37.8 % — ABNORMAL LOW (ref 39.0–52.0)
Hemoglobin: 12.5 g/dL — ABNORMAL LOW (ref 13.0–17.0)
MCH: 30.9 pg (ref 26.0–34.0)
MCV: 93.3 fL (ref 78.0–100.0)
RBC: 4.05 MIL/uL — ABNORMAL LOW (ref 4.22–5.81)
RDW: 13.4 % (ref 11.5–15.5)
WBC: 10.3 10*3/uL (ref 4.0–10.5)

## 2012-12-09 LAB — POCT I-STAT TROPONIN I: Troponin i, poc: 0.01 ng/mL (ref 0.00–0.08)

## 2012-12-09 LAB — GLUCOSE, CAPILLARY
Glucose-Capillary: 91 mg/dL (ref 70–99)
Glucose-Capillary: 107 mg/dL — ABNORMAL HIGH (ref 70–99)

## 2012-12-09 LAB — ETHANOL: Alcohol, Ethyl (B): <11 mg/dL (ref 0–11)

## 2012-12-09 LAB — RAPID URINE DRUG SCREEN, HOSP PERFORMED
Barbiturates: NOT DETECTED
Benzodiazepines: NOT DETECTED
Tetrahydrocannabinol: POSITIVE — AB

## 2012-12-09 LAB — TROPONIN I: Troponin I: <0.3 ng/mL (ref <0.30)

## 2012-12-09 MED ORDER — ASPIRIN 325 MG PO TABS
325.0000 mg | ORAL_TABLET | Freq: Every day | ORAL | Status: DC
  Administered 2012-12-10 – 2012-12-11 (×2): 325 mg via ORAL
  Filled 2012-12-09 (×2): qty 1

## 2012-12-09 MED ORDER — INSULIN GLARGINE 100 UNIT/ML ~~LOC~~ SOLN
10.0000 [IU] | Freq: Every day | SUBCUTANEOUS | Status: DC
  Filled 2012-12-09: qty 0.1

## 2012-12-09 MED ORDER — INSULIN ASPART 100 UNIT/ML ~~LOC~~ SOLN
5.0000 [IU] | Freq: Once | SUBCUTANEOUS | Status: AC
  Administered 2012-12-09: 5 [IU] via INTRAVENOUS
  Filled 2012-12-09: qty 1

## 2012-12-09 MED ORDER — HEPARIN SODIUM (PORCINE) 5000 UNIT/ML IJ SOLN
5000.0000 [IU] | Freq: Three times a day (TID) | INTRAMUSCULAR | Status: DC
  Administered 2012-12-09 – 2012-12-11 (×5): 5000 [IU] via SUBCUTANEOUS
  Filled 2012-12-09 (×8): qty 1

## 2012-12-09 MED ORDER — INSULIN ASPART 100 UNIT/ML ~~LOC~~ SOLN
0.0000 [IU] | Freq: Three times a day (TID) | SUBCUTANEOUS | Status: DC
  Administered 2012-12-10: 5 [IU] via SUBCUTANEOUS
  Administered 2012-12-10: 2 [IU] via SUBCUTANEOUS
  Administered 2012-12-11: 3 [IU] via SUBCUTANEOUS
  Administered 2012-12-11: 2 [IU] via SUBCUTANEOUS

## 2012-12-09 MED ORDER — SODIUM POLYSTYRENE SULFONATE 15 GM/60ML PO SUSP
15.0000 g | Freq: Once | ORAL | Status: AC
  Administered 2012-12-09: 15 g via ORAL
  Filled 2012-12-09: qty 60

## 2012-12-09 MED ORDER — CITALOPRAM HYDROBROMIDE 10 MG PO TABS
10.0000 mg | ORAL_TABLET | Freq: Every day | ORAL | Status: DC
  Administered 2012-12-09 – 2012-12-11 (×3): 10 mg via ORAL
  Filled 2012-12-09 (×3): qty 1

## 2012-12-09 MED ORDER — INSULIN GLARGINE 100 UNIT/ML ~~LOC~~ SOLN
10.0000 [IU] | Freq: Two times a day (BID) | SUBCUTANEOUS | Status: DC
Start: 2012-12-09 — End: 2012-12-11
  Administered 2012-12-09 – 2012-12-11 (×2): 10 [IU] via SUBCUTANEOUS
  Filled 2012-12-09 (×5): qty 0.1

## 2012-12-09 MED ORDER — SIMVASTATIN 40 MG PO TABS
40.0000 mg | ORAL_TABLET | Freq: Every day | ORAL | Status: DC
  Administered 2012-12-09 – 2012-12-10 (×2): 40 mg via ORAL
  Filled 2012-12-09 (×3): qty 1

## 2012-12-09 MED ORDER — SODIUM CHLORIDE 0.9 % IV SOLN
1.0000 g | Freq: Once | INTRAVENOUS | Status: AC
  Administered 2012-12-09: 1 g via INTRAVENOUS
  Filled 2012-12-09: qty 10

## 2012-12-09 MED ORDER — SENNOSIDES-DOCUSATE SODIUM 8.6-50 MG PO TABS: 1.0000 | ORAL_TABLET | Freq: Every evening | ORAL | Status: DC | PRN

## 2012-12-09 MED ORDER — DEXTROSE 50 % IV SOLN
1.0000 | Freq: Once | INTRAVENOUS | Status: AC
  Administered 2012-12-09: 50 mL via INTRAVENOUS
  Filled 2012-12-09: qty 50

## 2012-12-09 MED ORDER — RESOURCE THICKENUP CLEAR PO POWD
1.0000 | ORAL | Status: DC | PRN
  Filled 2012-12-09: qty 125

## 2012-12-09 MED ORDER — ZOLPIDEM TARTRATE 5 MG PO TABS: 10.0000 mg | ORAL_TABLET | Freq: Every day | ORAL | Status: DC

## 2012-12-09 MED ORDER — LORAZEPAM 1 MG PO TABS: 1.0000 mg | ORAL_TABLET | Freq: Two times a day (BID) | ORAL | Status: DC | PRN

## 2012-12-09 MED ORDER — ZOLPIDEM TARTRATE 5 MG PO TABS: 5.0000 mg | ORAL_TABLET | Freq: Every evening | ORAL | Status: DC | PRN

## 2012-12-09 MED ORDER — INSULIN ASPART 100 UNIT/ML ~~LOC~~ SOLN
5.0000 [IU] | Freq: Every day | SUBCUTANEOUS | Status: DC
  Administered 2012-12-11: 5 [IU] via SUBCUTANEOUS

## 2012-12-09 NOTE — ED Notes (Signed)
Pt family sts pt had stroke last week and was seen here and discharged over weekend; pt with 4 episodes of aphasia and left sided weakness with resolution in 2-3 min over last 4 days; pt sts aphasia and left sided weakness and numbness in left arm with LSN at 1345; per family speech is back to baseline with some residual aphasia from first stroke but left arm numbness and weakness is worse than baseline; pt tearful in triage

## 2012-12-09 NOTE — H&P (Addendum)
[facility] History and Physical  JAN OLANO WUJ:[id number] DOB: 1935/08/11 DOA: 12/09/2012  Referring physician: Dr. Romeo Apple PCP: Minda Meo, MD  Specialists: Neurology  Chief Complaint:  HPI: Casey Collins is a 77 y.o. male  With history of DM, hypertension, dyslipidemia, history of CVA diagnosed on 12/01/2012.  According to the family (patient is unable to provide history due to aphasia) the patient was having perioral numbness, expressive aphasia, and difficulty swallowing. He symptoms started approximately at 1:45 PM. The family noted that over the last few days he has had some mild expressive aphasia and left-handed numbness which lasted only minutes at a time. These symptoms return today at approximately 1:45 PM and persisted and as a result the patient presented to the ED.  Neurology was consult at and evaluated patient. We were consult at given neurology's recommendations for inpatient evaluation for strokelike symptoms.   Review of Systems: 10 point review of system reviewed and negative unless otherwise listed above.  Past Medical History  Diagnosis Date  . Stroke     Right side   . Diabetes mellitus   . Arthritis   . Hypertension   . Hyperlipemia   . Diverticulosis   . Colon polyp    Past Surgical History  Procedure Laterality Date  . Bilateral inguinal hernia     Social History:  reports that he has never smoked. He has never used smokeless tobacco. He reports that he does not drink alcohol or use illicit drugs.   No Known Allergies  Family History  Problem Relation Age of Onset  . Colon cancer Neg Hx   . Prostate cancer Brother   . Diabetes Daughter     Prior to Admission medications   Medication Sig Start Date End Date Taking? Authorizing Provider  aspirin 325 MG tablet Take 1 tablet (325 mg total) by mouth daily. 12/04/12  Yes Maryann Mikhail, DO  citalopram (CELEXA) 10 MG tablet Take 10 mg by mouth daily.  05/16/11  Yes Historical  Provider, MD  insulin aspart (NOVOLOG) 100 UNIT/ML injection Inject 5 Units into the skin daily. Sliding scale   Yes Historical Provider, MD  insulin glargine (LANTUS) 100 UNIT/ML injection Inject 10 Units into the skin 2 (two) times daily.    Yes Historical Provider, MD  LORazepam (ATIVAN) 1 MG tablet Take 1 mg by mouth 2 (two) times daily as needed for anxiety.   Yes Historical Provider, MD  Maltodextrin-Xanthan Gum (RESOURCE THICKENUP CLEAR) POWD Take 120 g by mouth as needed. 12/04/12  Yes Maryann Mikhail, DO  metoprolol tartrate (LOPRESSOR) 25 MG tablet Take 25 mg by mouth 2 (two) times daily.  04/20/11  Yes Historical Provider, MD  ramipril (ALTACE) 5 MG capsule Take 5 mg by mouth daily.  05/16/11  Yes Historical Provider, MD  simvastatin (ZOCOR) 40 MG tablet Take 40 mg by mouth at bedtime.  05/16/11  Yes Historical Provider, MD  zolpidem (AMBIEN) 10 MG tablet Take 10 mg by mouth at bedtime.  05/16/11  Yes Historical Provider, MD   Physical Exam: Filed Vitals:   12/09/12 1650  BP: 153/96  Pulse:   Temp:   Resp: 16     General:  Pt in NAD, Alert and Awake  Eyes: EOMI, non icteric  ENT: no masses on visual examination, MMM  Neck: supple, no goiter  Cardiovascular: RRR, no MRG  Respiratory: CTA BL, no wheezes  Abdomen: soft, NT, ND  Skin: warm and dry  Musculoskeletal: no cyanosis  Psychiatric: mood and  affect appropriate  Neurologic: Patient is aphasic, strength is equal BL  Labs on Admission:  Basic Metabolic Panel:  Recent Labs Lab 12/09/12 1436 12/09/12 1531  NA 136 139  K 5.6* 5.5*  CL 101 103  CO2 27  --   GLUCOSE 106* 106*  BUN 26* 28*  CREATININE 1.67* 1.90*  CALCIUM 9.4  --    Liver Function Tests:  Recent Labs Lab 12/09/12 1436  AST 25  ALT 28  ALKPHOS 72  BILITOT 0.4  PROT 7.2  ALBUMIN 3.8   No results found for this basename: LIPASE, AMYLASE,  in the last 168 hours No results found for this basename: AMMONIA,  in the last 168  hours CBC:  Recent Labs Lab 12/09/12 1436 12/09/12 1531  WBC 10.3  --   NEUTROABS 6.1  --   HGB 12.5* 14.3  HCT 37.8* 42.0  MCV 93.3  --   PLT 196  --    Cardiac Enzymes:  Recent Labs Lab 12/09/12 1436  TROPONINI <0.30    BNP (last 3 results) No results found for this basename: PROBNP,  in the last 8760 hours CBG:  Recent Labs Lab 12/03/12 2155 12/04/12 0108 12/04/12 0656 12/04/12 1157 12/09/12 1648  GLUCAP 146* 118* 110* 107* 91    Radiological Exams on Admission: Ct Head Wo Contrast  12/09/2012   CLINICAL DATA:  History of Bell's palsy. Left-sided deficits. Diabetic hypertensive hyperlipidemia patient.  EXAM: CT HEAD WITHOUT CONTRAST  TECHNIQUE: Contiguous axial images were obtained from the base of the skull through the vertex without intravenous contrast.  COMPARISON:  12/03/2012 MR. 12/01/2012 CT.  FINDINGS: No intracranial hemorrhage.  Recent (12/03/2012) right frontal/ frontal operculum infarct as noted on recent MR. No clear extension of the infarct by CT. Although subtle extension would be better defined by a followup MR if this would change the patient's course of therapy.  Remote bilateral basal ganglia infarcts.  Small vessel disease type changes.  Atrophy without hydrocephalus.  No intracranial mass lesion noted on this unenhanced exam.  Vascular calcifications.  Mucosal thickening maxillary sinuses with polypoid appearance on the right. Mucosal thickening/ opacification ethmoid sinus air cells.  IMPRESSION: No intracranial hemorrhage.  Recent (12/03/2012) right frontal/ frontal operculum infarct as noted on recent MR. No clear extension of the infarct by CT.  Remote bilateral basal ganglia infarcts.  Small vessel disease type changes.  Mucosal thickening maxillary sinuses with polypoid appearance on the right. Mucosal thickening/ opacification ethmoid sinus air cells.  These results were called by telephone at the time of interpretation on 12/09/2012 at 2:51 PM to  Dr. Noel Christmas , who verbally acknowledged these results.   Electronically Signed   By: Bridgett Larsson M.D.   On: 12/09/2012 14:52    EKG: Independently reviewed. Per my discussion with the ED physician EKG did not show peaked T waves. EKG currently not available for review on electronic medical record  Assessment/Plan Active Problems:   TIA (transient ischemic attack)/Stroke like symptoms   Renal insufficiency   Hyperkalemia DM H/o recent CVA    1. Strokelike symptoms: TIA versus stroke - In context of a patient with new stroke diagnosis a week prior to this current admission. - Will defer management to neurology. Please review their note for further recommendations. Otherwise will run routine stroke order set. - Continue simvastatin and aspirin  2. Hyperkalemia - Patient given 10 units of insulin and glucose in the ED. - Also had calcium gluconate - Will administer Kayexalate -  EKG   3. DM - Will plan on placing on diabetic diet -Place on sliding scale insulin -Monitor CBGs - Continue home dose Lantus 10 units each bedtime  4. Hypertension - We'll plan on holding blood pressure medications given #1. At this point we'll allow permissive hypertension  5. acute renal failure - Given elevated BUN creatinine ratio not quite 20-1 at this point we'll plan on hydrating and will assess serum creatinine next a.m. We'll plan on further workup should creatinine not improve with fluid hydration. - Most likely contributing to #2   Code Status: Full code Family Communication: Discussed with patient and family at bedside Disposition Plan: Pending further recommendations from neurologist  Time spent: More than 55 minutes  Penny Pia [facility] Pager [phone]  If 7PM-7AM, please contact night-coverage [email or web address] Password [facility] 12/09/2012, 5:53 PM

## 2012-12-09 NOTE — ED Notes (Signed)
Pt at CT

## 2012-12-09 NOTE — Consult Note (Signed)
Referring Physician: Romeo Apple    Chief Complaint: Stroke  HPI:                                                                                                                                         Casey Collins is an 77 y.o. male who has been recently seen in the hospital 12-29-2008 for bilateral CVA likely cardio embolic.  Patient was again recently seen on 12/01/12 for cortical right frontal lobe ischemic infarcts.  At that time he was on Plavix daily. At time of discharge he was changed to ASA due to dysphagia and needing pill to be crushed.  Patient returns today for acute onset of left hand decreased sensation and weakness. By time of consult symptoms had almost fully resolved. tPA was not given due to recent CVA and symptoms rapidly resolving.  Date last known well: Date: 12/09/2012 Time last known well: Time: 13:45 tPA Given: No: recent CVA and minimal symptoms NIHSS:  2  Past Medical History  Diagnosis Date  . Stroke     Right side   . Diabetes mellitus   . Arthritis   . Hypertension   . Hyperlipemia   . Diverticulosis   . Colon polyp     Past Surgical History  Procedure Laterality Date  . Bilateral inguinal hernia      Family History  Problem Relation Age of Onset  . Colon cancer Neg Hx   . Prostate cancer Brother   . Diabetes Daughter    Social History:  reports that he has never smoked. He has never used smokeless tobacco. He reports that he does not drink alcohol or use illicit drugs.  Allergies: No Known Allergies  Medications:                                                                                                                           No current facility-administered medications for this encounter.   Current Outpatient Prescriptions  Medication Sig Dispense Refill  . aspirin 325 MG tablet Take 1 tablet (325 mg total) by mouth daily.  30 tablet  0  . citalopram (CELEXA) 10 MG tablet Take 10 mg by mouth daily.       . insulin aspart (NOVOLOG)  100 UNIT/ML injection Inject 5 Units into the skin daily. Sliding scale      . insulin glargine (LANTUS)  100 UNIT/ML injection Inject 10 Units into the skin 2 (two) times daily.       Marland Kitchen LORazepam (ATIVAN) 1 MG tablet Take 1 mg by mouth 2 (two) times daily as needed for anxiety.      . Maltodextrin-Xanthan Gum (RESOURCE THICKENUP CLEAR) POWD Take 120 g by mouth as needed.  30 Can  0  . metoprolol tartrate (LOPRESSOR) 25 MG tablet Take 25 mg by mouth 2 (two) times daily.       . ramipril (ALTACE) 5 MG capsule Take 5 mg by mouth daily.       . simvastatin (ZOCOR) 40 MG tablet Take 40 mg by mouth at bedtime.       Marland Kitchen zolpidem (AMBIEN) 10 MG tablet Take 10 mg by mouth at bedtime.          ROS:                                                                                                                                       History obtained from the patient  General ROS: negative for - chills, fatigue, fever, night sweats, weight gain or weight loss Psychological ROS: negative for - behavioral disorder, hallucinations, memory difficulties, mood swings or suicidal ideation Ophthalmic ROS: negative for - blurry vision, double vision, eye pain or loss of vision ENT ROS: negative for - epistaxis, nasal discharge, oral lesions, sore throat, tinnitus or vertigo Allergy and Immunology ROS: negative for - hives or itchy/watery eyes Hematological and Lymphatic ROS: negative for - bleeding problems, bruising or swollen lymph nodes Endocrine ROS: negative for - galactorrhea, hair pattern changes, polydipsia/polyuria or temperature intolerance Respiratory ROS: negative for - cough, hemoptysis, shortness of breath or wheezing Cardiovascular ROS: negative for - chest pain, dyspnea on exertion, edema or irregular heartbeat Gastrointestinal ROS: negative for - abdominal pain, diarrhea, hematemesis, nausea/vomiting or stool incontinence Genito-Urinary ROS: negative for - dysuria, hematuria, incontinence or urinary  frequency/urgency Musculoskeletal ROS: negative for - joint swelling or muscular weakness Neurological ROS: as noted in HPI Dermatological ROS: negative for rash and skin lesion changes  Neurologic Examination:                                                                                                      Blood pressure 144/56, pulse 57, temperature 98.4 F (36.9 C), temperature source Oral, resp. rate 18, SpO2 98.00%.  Mental Status: Alert, oriented, thought content appropriate.  Speech dysarthric without evidence of aphasia.  Able to follow  3 step commands without difficulty. Cranial Nerves: II: Discs flat bilaterally; Visual fields grossly normal, pupils equal, round, reactive to light and accommodation III,IV, VI: ptosis not present, extra-ocular motions intact bilaterally V,VII: smile asymmetric with facial droop on the left ability to move the tight lower face but has had previous Bell's palsy, facial light touch sensation normal bilaterally VIII: hearing normal bilaterally IX,X: gag reflex present XI: bilateral shoulder shrug XII: midline tongue extension without atrophy or fasciculations  Motor: Right : Upper extremity   5/5    Left:     Upper extremity   5/5  Lower extremity   5/5     Lower extremity   5/5 Tone and bulk:normal tone throughout; no atrophy noted Sensory: Pinprick and light touch intact throughout, bilaterally Deep Tendon Reflexes:  Right: Upper Extremity   Left: Upper extremity   biceps (C-5 to C-6) 2/4   biceps (C-5 to C-6) 2/4 tricep (C7) 2/4    triceps (C7) 2/4 Brachioradialis (C6) 2/4  Brachioradialis (C6) 2/4  Lower Extremity Lower Extremity  quadriceps (L-2 to L-4) 2/4   quadriceps (L-2 to L-4) 2/4 Achilles (S1) 0/4   Achilles (S1) 0/4  Plantars: Mute bilaterally Cerebellar: normal finger-to-nose,  normal heel-to-shin test Gait: not assessed CV: pulses palpable throughout    No results found for this or any previous visit (from the past  48 hour(s)). Ct Head Wo Contrast  12/09/2012   CLINICAL DATA:  History of Bell's palsy. Left-sided deficits. Diabetic hypertensive hyperlipidemia patient.  EXAM: CT HEAD WITHOUT CONTRAST  TECHNIQUE: Contiguous axial images were obtained from the base of the skull through the vertex without intravenous contrast.  COMPARISON:  12/03/2012 MR. 12/01/2012 CT.  FINDINGS: No intracranial hemorrhage.  Recent (12/03/2012) right frontal/ frontal operculum infarct as noted on recent MR. No clear extension of the infarct by CT. Although subtle extension would be better defined by a followup MR if this would change the patient's course of therapy.  Remote bilateral basal ganglia infarcts.  Small vessel disease type changes.  Atrophy without hydrocephalus.  No intracranial mass lesion noted on this unenhanced exam.  Vascular calcifications.  Mucosal thickening maxillary sinuses with polypoid appearance on the right. Mucosal thickening/ opacification ethmoid sinus air cells.  IMPRESSION: No intracranial hemorrhage.  Recent (12/03/2012) right frontal/ frontal operculum infarct as noted on recent MR. No clear extension of the infarct by CT.  Remote bilateral basal ganglia infarcts.  Small vessel disease type changes.  Mucosal thickening maxillary sinuses with polypoid appearance on the right. Mucosal thickening/ opacification ethmoid sinus air cells.  These results were called by telephone at the time of interpretation on 12/09/2012 at 2:51 PM to Dr. Noel Christmas , who verbally acknowledged these results.   Electronically Signed   By: Bridgett Larsson M.D.   On: 12/09/2012 14:52   12/04/12 2D Echocardiogram EF 55%, wall motion normal. No cardiac source of emboli identified.  Carotid Doppler Right: 1-39% ICA stenosis. Left: 40-59% ICA stenosis. Vertebral artery flow is antegrade LDL 88 A1c: 5.9  Assessment and plan discussed with with attending physician and they are in agreement.    Felicie Morn PA-C [facility] [phone]  12/09/2012, 3:09 PM   Assessment: 77 y.o. male with acute onset left hand paresthesia and weakness.  Symptoms are rapidly improving.  tPA not given due to recent CVA and rapidly improving symptoms.  Patient was recently changed from Plavix to ASA 325 due to underlying dysphagia and need to crush pill.  Patient has likely  had a further right CVA and will obtain MRI brain for further workup.   Stroke Risk Factors - hyperlipidemia, hypertension and CVA  Recommend: 1) MRI brain without contrast 2) Continue ASA 3) Stroke team to follow.    I personally participated in this patient's evaluation and management, including formulating the above clinical impression and management recommendations.  Venetia Maxon M.D. [facility] [phone]

## 2012-12-09 NOTE — Plan of Care (Signed)
Pt arrived to the unit via stretcher. accompained by spouse. Pt is alert and oriented x 3. Oriented to room. Vs obtained. Call bell within reach and func. Safety maintained.

## 2012-12-09 NOTE — ED Notes (Signed)
Stroke team at CT

## 2012-12-09 NOTE — ED Notes (Signed)
Pt cleared by EDP

## 2012-12-09 NOTE — ED Provider Notes (Signed)
CSN: [id number]     Arrival date & time 12/09/12  1415 History   First MD Initiated Contact with Patient 12/09/12 1503     Chief Complaint  Patient presents with  . Code Stroke   (Consider location/radiation/quality/duration/timing/severity/associated sxs/prior Treatment) Patient is a 77 y.o. male presenting with neurologic complaint. The history is provided by the patient and a caregiver.  Neurologic Problem This is a recurrent problem. The current episode started 1 to 2 hours ago. The problem occurs constantly. The problem has been gradually improving. Pertinent negatives include no chest pain, no abdominal pain, no headaches and no shortness of breath. Nothing aggravates the symptoms. Nothing relieves the symptoms. He has tried nothing for the symptoms. The treatment provided no relief.    Past Medical History  Diagnosis Date  . Stroke     Right side   . Diabetes mellitus   . Arthritis   . Hypertension   . Hyperlipemia   . Diverticulosis   . Colon polyp    Past Surgical History  Procedure Laterality Date  . Bilateral inguinal hernia     Family History  Problem Relation Age of Onset  . Colon cancer Neg Hx   . Prostate cancer Brother   . Diabetes Daughter    History  Substance Use Topics  . Smoking status: Never Smoker   . Smokeless tobacco: Never Used  . Alcohol Use: No    Review of Systems  Constitutional: Negative for fever.  HENT: Negative for drooling and rhinorrhea.   Eyes: Negative for pain.  Respiratory: Negative for cough and shortness of breath.   Cardiovascular: Negative for chest pain and leg swelling.  Gastrointestinal: Negative for nausea, vomiting, abdominal pain and diarrhea.  Genitourinary: Negative for dysuria and hematuria.  Musculoskeletal: Negative for gait problem and neck pain.  Skin: Negative for color change.  Neurological: Negative for numbness and headaches.  Hematological: Negative for adenopathy.  Psychiatric/Behavioral: Negative for  behavioral problems.  All other systems reviewed and are negative.    Allergies  Review of patient's allergies indicates no known allergies.  Home Medications   Current Outpatient Rx  Name  Route  Sig  Dispense  Refill  . aspirin 325 MG tablet   Oral   Take 1 tablet (325 mg total) by mouth daily.   30 tablet   0   . citalopram (CELEXA) 10 MG tablet   Oral   Take 10 mg by mouth daily.          . insulin aspart (NOVOLOG) 100 UNIT/ML injection   Subcutaneous   Inject 5 Units into the skin daily. Sliding scale         . insulin glargine (LANTUS) 100 UNIT/ML injection   Subcutaneous   Inject 10 Units into the skin 2 (two) times daily.          Marland Kitchen LORazepam (ATIVAN) 1 MG tablet   Oral   Take 1 mg by mouth 2 (two) times daily as needed for anxiety.         . Maltodextrin-Xanthan Gum (RESOURCE THICKENUP CLEAR) POWD   Oral   Take 120 g by mouth as needed.   30 Can   0   . metoprolol tartrate (LOPRESSOR) 25 MG tablet   Oral   Take 25 mg by mouth 2 (two) times daily.          . ramipril (ALTACE) 5 MG capsule   Oral   Take 5 mg by mouth daily.          Marland Kitchen  simvastatin (ZOCOR) 40 MG tablet   Oral   Take 40 mg by mouth at bedtime.          Marland Kitchen zolpidem (AMBIEN) 10 MG tablet   Oral   Take 10 mg by mouth at bedtime.           BP 144/56  Pulse 57  Temp(Src) 98.4 F (36.9 C) (Oral)  Resp 18  SpO2 98% Physical Exam  Nursing note and vitals reviewed. Constitutional: He is oriented to person, place, and time. He appears well-developed and well-nourished.  HENT:  Head: Normocephalic and atraumatic.  Right Ear: External ear normal.  Left Ear: External ear normal.  Nose: Nose normal.  Mouth/Throat: Oropharynx is clear and moist. No oropharyngeal exudate.  Eyes: Conjunctivae and EOM are normal. Pupils are equal, round, and reactive to light.  Neck: Normal range of motion. Neck supple.  Cardiovascular: Normal rate, regular rhythm, normal heart sounds and  intact distal pulses.  Exam reveals no gallop and no friction rub.   No murmur heard. Pulmonary/Chest: Effort normal and breath sounds normal. No respiratory distress. He has no wheezes.  Abdominal: Soft. Bowel sounds are normal. He exhibits no distension. There is no tenderness. There is no rebound and no guarding.  Musculoskeletal: Normal range of motion. He exhibits no edema and no tenderness.  Neurological: He is alert and oriented to person, place, and time. He has normal strength. No sensory deficit.  No obvious motor or sensory deficit on my exam. No obvious pronator drift.  The family reports the patient has a history of Bell's palsy and has residual right-sided facial drooping which is evident on my exam.  The pt is a/o x3. He has normal understanding. His speech is slurred but the family notes this is not significantly changed from baseline.  Skin: Skin is warm and dry.  Psychiatric: He has a normal mood and affect. His behavior is normal.    ED Course  Procedures (including critical care time) Labs Review Labs Reviewed  CBC - Abnormal; Notable for the following:    RBC 4.05 (*)    Hemoglobin 12.5 (*)    HCT 37.8 (*)    All other components within normal limits  DIFFERENTIAL - Abnormal; Notable for the following:    Monocytes Absolute 1.2 (*)    All other components within normal limits  COMPREHENSIVE METABOLIC PANEL - Abnormal; Notable for the following:    Potassium 5.6 (*)    Glucose, Bld 106 (*)    BUN 26 (*)    Creatinine, Ser 1.67 (*)    GFR calc non Af Amer 38 (*)    GFR calc Af Amer 45 (*)    All other components within normal limits  URINE RAPID DRUG SCREEN (HOSP PERFORMED) - Abnormal; Notable for the following:    Tetrahydrocannabinol POSITIVE (*)    All other components within normal limits  URINALYSIS, ROUTINE W REFLEX MICROSCOPIC - Abnormal; Notable for the following:    APPearance HAZY (*)    All other components within normal limits  GLUCOSE,  CAPILLARY - Abnormal; Notable for the following:    Glucose-Capillary 107 (*)    All other components within normal limits  GLUCOSE, CAPILLARY - Abnormal; Notable for the following:    Glucose-Capillary 104 (*)    All other components within normal limits  BASIC METABOLIC PANEL - Abnormal; Notable for the following:    Glucose, Bld 109 (*)    Creatinine, Ser 1.38 (*)    GFR calc  non Af Amer 48 (*)    GFR calc Af Amer 56 (*)    All other components within normal limits  GLUCOSE, CAPILLARY - Abnormal; Notable for the following:    Glucose-Capillary 139 (*)    All other components within normal limits  POCT I-STAT, CHEM 8 - Abnormal; Notable for the following:    Potassium 5.5 (*)    BUN 28 (*)    Creatinine, Ser 1.90 (*)    Glucose, Bld 106 (*)    All other components within normal limits  ETHANOL  PROTIME-INR  APTT  TROPONIN I  GLUCOSE, CAPILLARY  POCT I-STAT TROPONIN I   Imaging Review Ct Head Wo Contrast  12/09/2012   CLINICAL DATA:  History of Bell's palsy. Left-sided deficits. Diabetic hypertensive hyperlipidemia patient.  EXAM: CT HEAD WITHOUT CONTRAST  TECHNIQUE: Contiguous axial images were obtained from the base of the skull through the vertex without intravenous contrast.  COMPARISON:  12/03/2012 MR. 12/01/2012 CT.  FINDINGS: No intracranial hemorrhage.  Recent (12/03/2012) right frontal/ frontal operculum infarct as noted on recent MR. No clear extension of the infarct by CT. Although subtle extension would be better defined by a followup MR if this would change the patient's course of therapy.  Remote bilateral basal ganglia infarcts.  Small vessel disease type changes.  Atrophy without hydrocephalus.  No intracranial mass lesion noted on this unenhanced exam.  Vascular calcifications.  Mucosal thickening maxillary sinuses with polypoid appearance on the right. Mucosal thickening/ opacification ethmoid sinus air cells.  IMPRESSION: No intracranial hemorrhage.  Recent  (12/03/2012) right frontal/ frontal operculum infarct as noted on recent MR. No clear extension of the infarct by CT.  Remote bilateral basal ganglia infarcts.  Small vessel disease type changes.  Mucosal thickening maxillary sinuses with polypoid appearance on the right. Mucosal thickening/ opacification ethmoid sinus air cells.  These results were called by telephone at the time of interpretation on 12/09/2012 at 2:51 PM to Dr. Noel Christmas , who verbally acknowledged these results.   Electronically Signed   By: Bridgett Larsson M.D.   On: 12/09/2012 14:52     Date: 12/09/2012  Rate: 44  Rhythm: sinus bradycardia  QRS Axis: normal  Intervals: normal  ST/T Wave abnormalities: normal  Conduction Disutrbances:none  Narrative Interpretation: baseline artifact  Old EKG Reviewed: none available    MDM   1. TIA (transient ischemic attack)   2. Renal insufficiency   3. Hyperkalemia    3:14 PM 77 y.o. male who presents with concern for stroke. The patient was admitted approximately one week ago for strokelike symptoms and found to have evidence of acute infarcts on his MRI. The family reports that the patient was having perioral numbness, expressive aphasia, and difficulty swallowing at that time. He was recently discharged. The family notes that over the last few days he has had some mild expressive aphasia and left-handed numbness which lasted only minutes at a time. These symptoms return today at approximately 1:45 PM and persisted. The patient presents to the ER now for stroke evaluation. The patient is afebrile and vital signs are unremarkable on exam. Code stroke initiated upon arrival.   Neuro has seen, recommend admission to hospitalist.     Junius Argyle, MD 12/10/12 1351

## 2012-12-10 ENCOUNTER — Inpatient Hospital Stay (HOSPITAL_COMMUNITY): Payer: Medicare Other

## 2012-12-10 ENCOUNTER — Encounter (HOSPITAL_COMMUNITY): Payer: Self-pay | Admitting: *Deleted

## 2012-12-10 DIAGNOSIS — N179 Acute kidney failure, unspecified: Secondary | ICD-10-CM

## 2012-12-10 DIAGNOSIS — M3390 Dermatopolymyositis, unspecified, organ involvement unspecified: Secondary | ICD-10-CM | POA: Diagnosis not present

## 2012-12-10 DIAGNOSIS — N289 Disorder of kidney and ureter, unspecified: Secondary | ICD-10-CM

## 2012-12-10 DIAGNOSIS — I6350 Cerebral infarction due to unspecified occlusion or stenosis of unspecified cerebral artery: Principal | ICD-10-CM

## 2012-12-10 DIAGNOSIS — G459 Transient cerebral ischemic attack, unspecified: Secondary | ICD-10-CM | POA: Diagnosis not present

## 2012-12-10 LAB — GLUCOSE, CAPILLARY
Glucose-Capillary: 104 mg/dL — ABNORMAL HIGH (ref 70–99)
Glucose-Capillary: 139 mg/dL — ABNORMAL HIGH (ref 70–99)
Glucose-Capillary: 233 mg/dL — ABNORMAL HIGH (ref 70–99)
Glucose-Capillary: 70 mg/dL (ref 70–99)
Glucose-Capillary: 158 mg/dL — ABNORMAL HIGH (ref 70–99)

## 2012-12-10 LAB — BASIC METABOLIC PANEL
CO2: 25 mEq/L (ref 19–32)
Creatinine, Ser: 1.38 mg/dL — ABNORMAL HIGH (ref 0.50–1.35)
GFR calc non Af Amer: 48 mL/min — ABNORMAL LOW (ref >90)
Glucose, Bld: 109 mg/dL — ABNORMAL HIGH (ref 70–99)
Potassium: 4.4 mEq/L (ref 3.5–5.1)
Sodium: 138 mEq/L (ref 135–145)

## 2012-12-10 MED ORDER — SIMETHICONE 40 MG/0.6ML PO SUSP: 80.0000 mg | Freq: Four times a day (QID) | ORAL | Status: DC | PRN

## 2012-12-10 MED ORDER — SODIUM CHLORIDE 0.9 % IV SOLN
INTRAVENOUS | Status: AC
  Administered 2012-12-10: 11:00:00 via INTRAVENOUS

## 2012-12-10 MED ORDER — SODIUM CHLORIDE 0.9 % IV BOLUS (SEPSIS)
500.0000 mL | Freq: Once | INTRAVENOUS | Status: AC
  Administered 2012-12-10: 500 mL via INTRAVENOUS

## 2012-12-10 MED ORDER — ACETAMINOPHEN 325 MG PO TABS
650.0000 mg | ORAL_TABLET | ORAL | Status: DC | PRN
  Administered 2012-12-10 – 2012-12-11 (×4): 650 mg via ORAL
  Filled 2012-12-10 (×4): qty 2

## 2012-12-10 MED ORDER — SIMETHICONE 80 MG PO CHEW
80.0000 mg | CHEWABLE_TABLET | Freq: Four times a day (QID) | ORAL | Status: DC | PRN
  Administered 2012-12-11: 80 mg via ORAL
  Filled 2012-12-10 (×2): qty 1

## 2012-12-10 NOTE — Evaluation (Signed)
Speech Language Pathology Evaluation Patient Details Name: Casey Collins MRN: [id number] DOB: Jul 08, 1935 Today's Date: 12/10/2012 Time: 1478-2956 SLP Time Calculation (min): 27 min  Problem List:  Patient Active Problem List   Diagnosis Date Noted  . TIA (transient ischemic attack) 12/09/2012  . Renal insufficiency 12/09/2012  . Hyperkalemia 12/09/2012  . Acute kidney injury 12/02/2012  . Difficulty swallowing 12/02/2012  . CVA (cerebral infarction) 12/01/2012  . H/O: CVA (cerebrovascular accident) 10/18/2011  . Chronic anticoagulation 10/18/2011  . Diverticulosis 10/18/2011  . Hx of adenomatous colonic polyps 10/18/2011  . Internal hemorrhoids 10/18/2011  . HTN (hypertension) 10/18/2011  . DM (dermatomyositis) 10/18/2011  . Osteoarthritis 10/18/2011   Past Medical History:  Past Medical History  Diagnosis Date  . Stroke     Right side   . Diabetes mellitus   . Arthritis   . Hypertension   . Hyperlipemia   . Diverticulosis   . Colon polyp    Past Surgical History:  Past Surgical History  Procedure Laterality Date  . Bilateral inguinal hernia     HPI:  Casey Collins is a 77 y.o. male with history of DM, hypertension, dyslipidemia, history of CVA diagnosed on 12/01/2012. According to the family (patient is unable to provide history due to aphasia) the patient was having perioral numbness, expressive aphasia, and difficulty swallowing. He symptoms started approximately at 1:45 PM. The family noted that over the last few days he has had some mild expressive aphasia and left-handed numbness which lasted only minutes at a time. These symptoms return today at approximately 1:45 PM and persisted and as a result the patient presented to the ED.  MRI shows 2 new areas of cortical infarction involving the right frontal operculum and the right parietal lobe. as well as old high right frontal infarct. MBS from last admit 12/02/12 recommend Dys 1 (puree) and honey thick liquids with  a chin tuck and potential for an upgrade to nectar thick liqudis. Sensed aspiration of thin with weak, ineffective cough.   Assessment / Plan / Recommendation Clinical Impression  Pt demonstrates mild worsening of baseline dysarthria, slightly less intelligible to family members. Pt able to verbalize compensatory strategies with min question cues. Cognition otherwise appears unaffected by new CVA, expect pt to participate in ADLs as he has been at baseline. Recommend pt continue therapy with home health SLP to address speech intelligiblity. No f/u for cognitive linguistic needs in acute setting.     SLP Assessment  All further Speech Lanaguage Pathology  needs can be addressed in the next venue of care    Follow Up Recommendations  Home health SLP    Frequency and Duration min 2x/week      Pertinent Vitals/Pain NA   SLP Goals     SLP Evaluation Prior Functioning  Cognitive/Linguistic Baseline: Baseline deficits Baseline deficit details: dysarthria moderate Type of Home: House  Lives With: Family Available Help at Discharge: Family;Available 24 hours/day Vocation: On disability   Cognition  Overall Cognitive Status: Within Functional Limits for tasks assessed Arousal/Alertness: Awake/alert Orientation Level: Oriented X4 Attention: Sustained;Selective Sustained Attention: Appears intact Selective Attention: Appears intact Memory: Appears intact Awareness: Appears intact Problem Solving: Appears intact Safety/Judgment: Appears intact    Comprehension  Auditory Comprehension Overall Auditory Comprehension: Appears within functional limits for tasks assessed Reading Comprehension Reading Status: Within funtional limits    Expression Verbal Expression Overall Verbal Expression: Appears within functional limits for tasks assessed Written Expression Dominant Hand: Right   Oral / Motor Oral  Motor/Sensory Function Overall Oral Motor/Sensory Function: Impaired at  baseline Labial ROM: Reduced right Labial Symmetry: Abnormal symmetry right Labial Strength: Reduced Labial Sensation: Reduced Lingual ROM: Reduced right Lingual Symmetry: Within Functional Limits Lingual Strength: Reduced Lingual Sensation: Reduced Facial ROM: Reduced right Facial Symmetry: Right droop;Right drooping eyelid Facial Strength: Reduced Facial Sensation: Reduced Velum: Within Functional Limits Mandible: Within Functional Limits Motor Speech Overall Motor Speech: Impaired Respiration: Impaired Level of Impairment: Phrase Phonation: Low vocal intensity;Breathy Articulation: Impaired Level of Impairment: Word Intelligibility: Intelligibility reduced Word: 50-74% accurate Phrase: 50-74% accurate Sentence: 50-74% accurate Conversation: 25-49% accurate Motor Planning: Witnin functional limits Motor Speech Errors: Aware;Consistent Interfering Components: Premorbid status Effective Techniques: Slow rate;Over-articulate;Increased vocal intensity   GO    Harlon Ditty, MA CCC-SLP [phone]  Claudine Mouton 12/10/2012, 10:43 AM

## 2012-12-10 NOTE — Progress Notes (Signed)
[facility] PROGRESS NOTE  Casey Collins ZOX:[id number] DOB: 05-11-1935 DOA: 12/09/2012 PCP: Minda Meo, MD  Assessment/Plan: 1. Strokelike symptoms: TIA versus stroke versus seizure - In context of a patient with new stroke diagnosis a week prior to this current admission.  - Will defer management to neurology. At this point they are suspecting seizures an EEG has been ordered.  2. Hyperkalemia  - has resolved  3. DM  - Will plan on placing on diabetic diet  -Place on sliding scale insulin  -Monitor CBGs  - Continue home dose Lantus 10 units each bedtime   4. Hypertension  - will hold blood pressure medications, but if neurology suspects seizure as cause of problem then may restart blood pressure medications  5. acute renal failure  - -most likely due to dehydration, has improved with IV fluid administration. Will check serum creatinine next a.m.   Code Status:full Family Communication: discuss with patient and family Disposition Plan: pending further recommendations from specialist   Consultants:  neurology  Procedures:  MRI of brain   CT head  Antibiotics:  none  HPI/Subjective: No new complaints today  Objective: Filed Vitals:   12/10/12 1500  BP: 146/63  Pulse: 65  Temp: 97.4 F (36.3 C)  Resp: 17    Intake/Output Summary (Last 24 hours) at 12/10/12 1852 Last data filed at 12/10/12 1754  Gross per 24 hour  Intake 1173.33 ml  Output    400 ml  Net 773.33 ml   Filed Weights   12/09/12 2000  Weight: 90 kg (198 lb 6.6 oz)    Exam:   General: Pt is alert and awake  Cardiovascular: RRR, no MRG  Respiratory: CTA BL, no wheezes  Abdomen: soft, NT, ND  Musculoskeletal: no cyanosis or clubbing   Data Reviewed: Basic Metabolic Panel:  Recent Labs Lab 12/09/12 1436 12/09/12 1531 12/10/12 0900  NA 136 139 138  K 5.6* 5.5* 4.4  CL 101 103 102  CO2 27  --  25  GLUCOSE 106* 106* 109*  BUN 26* 28* 20  CREATININE  1.67* 1.90* 1.38*  CALCIUM 9.4  --  9.6   Liver Function Tests:  Recent Labs Lab 12/09/12 1436  AST 25  ALT 28  ALKPHOS 72  BILITOT 0.4  PROT 7.2  ALBUMIN 3.8   No results found for this basename: LIPASE, AMYLASE,  in the last 168 hours No results found for this basename: AMMONIA,  in the last 168 hours CBC:  Recent Labs Lab 12/09/12 1436 12/09/12 1531  WBC 10.3  --   NEUTROABS 6.1  --   HGB 12.5* 14.3  HCT 37.8* 42.0  MCV 93.3  --   PLT 196  --    Cardiac Enzymes:  Recent Labs Lab 12/09/12 1436  TROPONINI <0.30   BNP (last 3 results) No results found for this basename: PROBNP,  in the last 8760 hours CBG:  Recent Labs Lab 12/09/12 1648 12/09/12 2216 12/10/12 0656 12/10/12 1135 12/10/12 1645  GLUCAP 91 107* 104* 139* 233*    No results found for this or any previous visit (from the past 240 hour(s)).   Studies: Dg Chest 2 View  12/10/2012   CLINICAL DATA:  Recent CVA.  EXAM: CHEST  2 VIEW  COMPARISON:  Chest radiograph performed 01/04/2009  FINDINGS: The lungs are well-aerated and clear. There is no evidence of focal opacification, pleural effusion or pneumothorax. Vague density near the right lung base is thought to reflect overlying soft tissues.  The  heart is borderline normal in size; the mediastinal contour is within normal limits. No acute osseous abnormalities are seen.  IMPRESSION: No acute cardiopulmonary process seen.   Electronically Signed   By: Roanna Raider M.D.   On: 12/10/2012 02:09   Ct Head Wo Contrast  12/09/2012   CLINICAL DATA:  History of Bell's palsy. Left-sided deficits. Diabetic hypertensive hyperlipidemia patient.  EXAM: CT HEAD WITHOUT CONTRAST  TECHNIQUE: Contiguous axial images were obtained from the base of the skull through the vertex without intravenous contrast.  COMPARISON:  12/03/2012 MR. 12/01/2012 CT.  FINDINGS: No intracranial hemorrhage.  Recent (12/03/2012) right frontal/ frontal operculum infarct as noted on recent  MR. No clear extension of the infarct by CT. Although subtle extension would be better defined by a followup MR if this would change the patient's course of therapy.  Remote bilateral basal ganglia infarcts.  Small vessel disease type changes.  Atrophy without hydrocephalus.  No intracranial mass lesion noted on this unenhanced exam.  Vascular calcifications.  Mucosal thickening maxillary sinuses with polypoid appearance on the right. Mucosal thickening/ opacification ethmoid sinus air cells.  IMPRESSION: No intracranial hemorrhage.  Recent (12/03/2012) right frontal/ frontal operculum infarct as noted on recent MR. No clear extension of the infarct by CT.  Remote bilateral basal ganglia infarcts.  Small vessel disease type changes.  Mucosal thickening maxillary sinuses with polypoid appearance on the right. Mucosal thickening/ opacification ethmoid sinus air cells.  These results were called by telephone at the time of interpretation on 12/09/2012 at 2:51 PM to Dr. Noel Christmas , who verbally acknowledged these results.   Electronically Signed   By: Bridgett Larsson M.D.   On: 12/09/2012 14:52   Mr Brain Wo Contrast  12/09/2012   CLINICAL DATA:  Recent onset of bilateral infarcts. Acute onset of left hand decreased sensation and weakness. The symptoms have since resolved.  EXAM: MRI HEAD WITHOUT CONTRAST  TECHNIQUE: Multiplanar, multiecho pulse sequences of the brain and surrounding structures were obtained without intravenous contrast.  COMPARISON:  CT head without contrast from the same day. MRI head without contrast 12/03/2012.  FINDINGS: The previously seen high right frontal lobe infarct is again seen. There is a new linear area of restricted diffusion involving the right frontal lobe along the primary sensory cortex measuring 12 mm. A 2nd area of new cortical infarction is noted along the right frontal operculum, measuring 11 mm.  T2 changes are present with the subacute infarcts.  Remote lacunar infarcts  are present within the basal ganglia bilaterally. Periventricular and subcortical white matter changes are otherwise stable bilaterally.  Flow is present in the major intracranial arteries. The globes and orbits are intact. The in the right maxillary sinuses chronically opacified. Circumferential mucosal thickening is again noted in the left maxillary sinus. Diffuse ethmoid disease has slightly progressed. The mastoid air cells are clear.  IMPRESSION: 1. 2 new areas of cortical infarction involving the right frontal operculum and the right parietal lobe. 2. The areas of previously seen acute infarction demonstrate expected change. 3. No evidence for hemorrhage or mass. 4. Remote lacunar infarcts of the basal ganglia bilaterally. 5. Diffuse sinus disease.   Electronically Signed   By: Gennette Pac M.D.   On: 12/09/2012 18:15    Scheduled Meds: . aspirin  325 mg Oral Daily  . citalopram  10 mg Oral Daily  . heparin  5,000 Units Subcutaneous Q8H  . insulin aspart  0-15 Units Subcutaneous TID WC  . insulin aspart  5  Units Subcutaneous Daily  . insulin glargine  10 Units Subcutaneous BID  . simvastatin  40 mg Oral QHS   Continuous Infusions:   Active Problems:   Chronic anticoagulation   HTN (hypertension)   DM (dermatomyositis)   TIA (transient ischemic attack)   Renal insufficiency   Hyperkalemia    Time spent: > 35 minutes    Penny Pia  [facility] Pager [phone]. If 7PM-7AM, please contact night-coverage at [email or web address], password [facility] 12/10/2012, 6:52 PM  LOS: 1 day

## 2012-12-10 NOTE — Procedures (Signed)
ELECTROENCEPHALOGRAM REPORT   Patient: Casey Collins       Room #: 1O10 EEG No. ID: [id number] Age: 77 y.o.        Sex: male Referring Physician: Cena Benton Report Date:  12/10/2012        Interpreting Physician: Aline Brochure  History: [name] is an 77 y.o. male with recurrent spells of left upper extremity numbness and speech difficulty. MRI showed no areas of right opercular and parietal infarctions.  Indications for study:  Rule out focal seizure activity.  Technique: This is an 18 channel routine scalp EEG performed at the bedside with bipolar and monopolar montages arranged in accordance to the international 10/20 system of electrode placement.   Description: This EEG recording was performed during wakefulness and during drowsiness. Background activity during wakefulness consisted of 9-10 Hz symmetrical alpha rhythm which attenuated fairly well with eye opening. During drowsiness there was slowing of background activity symmetrically with mixed irregular delta and theta activity. Stage II of sleep was not achieved. Photic stimulation was not performed. Hyperventilation was not performed. No epileptiform discharges were recorded. There were no areas of abnormal slowing.  Interpretation: This is a normal EEG recording during wakefulness and during drowsiness. No evidence of an epileptic disorder is demonstrated.   Venetia Maxon M.D. [facility] [phone]

## 2012-12-10 NOTE — Progress Notes (Signed)
Patient c/o headache. No PRN medication ordered. MD paged. Awaiting on call.   Sim Boast, RN 12/10/12 0200

## 2012-12-10 NOTE — Progress Notes (Signed)
[facility]  Patient Status: Active (receiving services up to time of hospitalization)  [facility] is providing the following services: PT and ST  If patient discharges after hours, please call [phone].   Jodene Nam 12/10/2012, 9:49 AM

## 2012-12-10 NOTE — Progress Notes (Signed)
Physical Therapy Evaluation  Past Medical History  Diagnosis Date  . Stroke     Right side   . Diabetes mellitus   . Arthritis   . Hypertension   . Hyperlipemia   . Diverticulosis   . Colon polyp    Past Surgical History  Procedure Laterality Date  . Bilateral inguinal hernia      12/10/12 0800  PT Visit Information  Last PT Received On 12/10/12  Assistance Needed +1  History of Present Illness pt presents with concern for new CVA with recent hx of multiple CVAs.    Precautions  Precautions Fall  Restrictions  Weight Bearing Restrictions No  Home Living  Family/patient expects to be discharged to: Private residence  Living Arrangements Children  Available Help at Discharge Family;Available 24 hours/day  Type of Home House  Home Access Ramped entrance;Stairs to enter  Entrance Stairs-Number of Steps 3  Entrance Stairs-Rails Right  Home Layout One level  Home Equipment Tub bench;Grab bars - toilet;Grab bars - tub/shower;Hand held shower head;BSC  Additional Comments Per daughter pt refuses AD.    Lives With Family  Prior Function  Level of Independence Needs assistance  ADL's / Homemaking Assistance Needed Family set-up of tub bench, otherwise Mod I with bathing and dressing.  Family performs homemaking.    Communication  Communication Expressive difficulties  Cognition  Arousal/Alertness Awake/alert  Behavior During Therapy WFL for tasks assessed/performed  Overall Cognitive Status Within Functional Limits for tasks assessed  Upper Extremity Assessment  Upper Extremity Assessment Defer to OT evaluation  Lower Extremity Assessment  Lower Extremity Assessment LLE deficits/detail  LLE Deficits / Details Generally weak since previous CVA.    Cervical / Trunk Assessment  Cervical / Trunk Assessment Normal  Bed Mobility  Bed Mobility Supine to Sit;Sitting - Scoot to Edge of Bed  Supine to Sit 5: Supervision;With rails;HOB elevated  Sitting - Scoot to Edge of Bed 5:  Supervision  Details for Bed Mobility Assistance cues for use of bed rails and encouragement.    Transfers  Transfers Sit to Stand;Stand to Sit  Sit to Stand 5: Supervision;With upper extremity assist;From bed  Stand to Sit 5: Supervision;With upper extremity assist;To chair/3-in-1  Details for Transfer Assistance cues for UE use and controlling descent to chair.    Ambulation/Gait  Ambulation/Gait Assistance 4: Min guard  Ambulation Distance (Feet) 140 Feet  Assistive device None  Ambulation/Gait Assistance Details pt continues with L foot flat from old CVA and appears only mildly more unsteady than baseline.  pt tends to lean anteriorly and at times seems to get ahead of his feet, but is able to self-correct with cueing to attend to balance and slow down.    Gait Pattern Step-through pattern;Decreased stride length;Decreased hip/knee flexion - left;Decreased dorsiflexion - left  Stairs Yes  Stairs Assistance 4: Min assist  Stairs Assistance Details (indicate cue type and reason) cues to slow down and for safety.  pt tends to get ahead of his feet while amb down the stairs.    Stair Management Technique One rail Right  Number of Stairs 4  Wheelchair Mobility  Wheelchair Mobility No  Modified Rankin (Stroke Patients Only)  Pre-Morbid Rankin Score 3  Modified Rankin 4  Balance  Balance Assessed Yes  Static Standing Balance  Static Standing - Balance Support No upper extremity supported  Static Standing - Level of Assistance 5: Stand by assistance  PT - End of Session  Equipment Utilized During Treatment Gait belt  Activity  Tolerance Patient tolerated treatment well  Patient left in chair;with call bell/phone within reach;with family/visitor present  Nurse Communication Mobility status  PT Assessment  PT Recommendation/Assessment Patient needs continued PT services  PT Problem List Decreased strength;Decreased activity tolerance;Decreased balance;Decreased mobility;Decreased  knowledge of use of DME  PT Therapy Diagnosis  Difficulty walking  PT Plan  PT Frequency Min 4X/week  PT Treatment/Interventions DME instruction;Gait training;Stair training;Functional mobility training;Therapeutic activities;Therapeutic exercise;Balance training;Neuromuscular re-education;Patient/family education  PT Recommendation  Follow Up Recommendations Home health PT;Supervision/Assistance - 24 hour  PT equipment None recommended by PT  Individuals Consulted  Consulted and Agree with Results and Recommendations Patient  Acute Rehab PT Goals  Patient Stated Goal Get home. Speech therapy  PT Goal Formulation With patient  Time For Goal Achievement 12/17/12  Potential to Achieve Goals Good  PT Time Calculation  PT Start Time 0822  PT Stop Time 0842  PT Time Calculation (min) 20 min  PT General Charges  $$ ACUTE PT VISIT 1 Procedure  PT Evaluation  $Initial PT Evaluation Tier I 1 Procedure  PT Treatments  $Gait Training 8-22 mins   [place], PT [phone]

## 2012-12-10 NOTE — Progress Notes (Signed)
EEG completed; results pending.    

## 2012-12-10 NOTE — Progress Notes (Signed)
Stroke Team Progress Note  HISTORY  Casey Collins is an 77 y.o. male who has been recently seen in the hospital 12-29-2008 for bilateral CVA likely cardio embolic.  Patient was again recently seen on 12/01/12 for cortical right frontal lobe ischemic infarcts.  At that time he was on Plavix daily. At time of discharge he was changed to ASA due to dysphagia and needing pill to be crushed.  Patient returns today for acute onset of left hand decreased sensation and weakness. By time of consult symptoms had almost fully resolved. NIHSS:  2   tPA was not given due to recent CVA and symptoms rapidly resolving.  SUBJECTIVE Overall he feels his condition is gradually improving.No chest pain or SOB. No fever or chills. His left hand weakness resolved.  Patient had three episodes of short lasting left hand jerking movements per family. It is not sure whether patient had seizure or new episodes of TIA.   OBJECTIVE Most recent Vital Signs: Filed Vitals:   12/09/12 2000 12/09/12 2213 12/10/12 0149 12/10/12 0505  BP:  152/71 149/65 145/66  Pulse:  49 50 52  Temp:  98.7 F (37.1 C) 97.9 F (36.6 C) 97.8 F (36.6 C)  TempSrc:  Oral Oral Oral  Resp:  16 18 16   Height: 5\' 10"  (1.778 m)     Weight: 198 lb 6.6 oz (90 kg)     SpO2:  98% 96% 97%   CBG (last 3)   Recent Labs  12/09/12 1648 12/09/12 2216 12/10/12 0656  GLUCAP 91 107* 104*    IV Fluid Intake:   . sodium chloride      MEDICATIONS  . aspirin  325 mg Oral Daily  . citalopram  10 mg Oral Daily  . heparin  5,000 Units Subcutaneous Q8H  . insulin aspart  0-15 Units Subcutaneous TID WC  . insulin aspart  5 Units Subcutaneous Daily  . insulin glargine  10 Units Subcutaneous BID  . simvastatin  40 mg Oral QHS  . sodium chloride  500 mL Intravenous Once   PRN:  acetaminophen, LORazepam, RESOURCE THICKENUP CLEAR, senna-docusate, zolpidem  Diet:   Dys 1 diet Activity:  Bedrest DVT Prophylaxis:  SQ heparin  CLINICALLY SIGNIFICANT  STUDIES Basic Metabolic Panel:   Recent Labs Lab 12/09/12 1436 12/09/12 1531 12/10/12 0900  NA 136 139 138  K 5.6* 5.5* 4.4  CL 101 103 102  CO2 27  --  25  GLUCOSE 106* 106* 109*  BUN 26* 28* 20  CREATININE 1.67* 1.90* 1.38*  CALCIUM 9.4  --  9.6   Liver Function Tests:   Recent Labs Lab 12/09/12 1436  AST 25  ALT 28  ALKPHOS 72  BILITOT 0.4  PROT 7.2  ALBUMIN 3.8   CBC:   Recent Labs Lab 12/09/12 1436 12/09/12 1531  WBC 10.3  --   NEUTROABS 6.1  --   HGB 12.5* 14.3  HCT 37.8* 42.0  MCV 93.3  --   PLT 196  --    Coagulation:   Recent Labs Lab 12/09/12 1436  LABPROT 13.3  INR 1.03   Cardiac Enzymes:   Recent Labs Lab 12/09/12 1436  TROPONINI <0.30   Urinalysis:   Recent Labs Lab 12/09/12 1651  COLORURINE YELLOW  LABSPEC 1.020  PHURINE 7.5  GLUCOSEU NEGATIVE  HGBUR NEGATIVE  BILIRUBINUR NEGATIVE  KETONESUR NEGATIVE  PROTEINUR NEGATIVE  UROBILINOGEN 1.0  NITRITE NEGATIVE  LEUKOCYTESUR NEGATIVE   Lipid Panel    Component Value Date/Time  CHOL 140 12/02/2012 0638   TRIG 89 12/02/2012 0638   HDL 34* 12/02/2012 0638   CHOLHDL 4.1 12/02/2012 0638   VLDL 18 12/02/2012 0638   LDLCALC 88 12/02/2012 0638   HgbA1C  Lab Results  Component Value Date   HGBA1C 5.9* 12/02/2012    Urine Drug Screen:     Component Value Date/Time   LABOPIA NONE DETECTED 12/09/2012 1651   COCAINSCRNUR NONE DETECTED 12/09/2012 1651   LABBENZ NONE DETECTED 12/09/2012 1651   AMPHETMU NONE DETECTED 12/09/2012 1651   THCU POSITIVE* 12/09/2012 1651   LABBARB NONE DETECTED 12/09/2012 1651    Alcohol Level:   Recent Labs Lab 12/09/12 1436  ETH <11    Dg Chest 2 View  12/10/2012   CLINICAL DATA:  Recent CVA.  EXAM: CHEST  2 VIEW  COMPARISON:  Chest radiograph performed 01/04/2009  FINDINGS: The lungs are well-aerated and clear. There is no evidence of focal opacification, pleural effusion or pneumothorax. Vague density near the right lung base is thought to  reflect overlying soft tissues.  The heart is borderline normal in size; the mediastinal contour is within normal limits. No acute osseous abnormalities are seen.  IMPRESSION: No acute cardiopulmonary process seen.   Electronically Signed   By: Roanna Raider M.D.   On: 12/10/2012 02:09   Ct Head Wo Contrast  12/09/2012   CLINICAL DATA:  History of Bell's palsy. Left-sided deficits. Diabetic hypertensive hyperlipidemia patient.  EXAM: CT HEAD WITHOUT CONTRAST  TECHNIQUE: Contiguous axial images were obtained from the base of the skull through the vertex without intravenous contrast.  COMPARISON:  12/03/2012 MR. 12/01/2012 CT.  FINDINGS: No intracranial hemorrhage.  Recent (12/03/2012) right frontal/ frontal operculum infarct as noted on recent MR. No clear extension of the infarct by CT. Although subtle extension would be better defined by a followup MR if this would change the patient's course of therapy.  Remote bilateral basal ganglia infarcts.  Small vessel disease type changes.  Atrophy without hydrocephalus.  No intracranial mass lesion noted on this unenhanced exam.  Vascular calcifications.  Mucosal thickening maxillary sinuses with polypoid appearance on the right. Mucosal thickening/ opacification ethmoid sinus air cells.  IMPRESSION: No intracranial hemorrhage.  Recent (12/03/2012) right frontal/ frontal operculum infarct as noted on recent MR. No clear extension of the infarct by CT.  Remote bilateral basal ganglia infarcts.  Small vessel disease type changes.  Mucosal thickening maxillary sinuses with polypoid appearance on the right. Mucosal thickening/ opacification ethmoid sinus air cells.  These results were called by telephone at the time of interpretation on 12/09/2012 at 2:51 PM to Dr. Noel Christmas , who verbally acknowledged these results.   Electronically Signed   By: Bridgett Larsson M.D.   On: 12/09/2012 14:52   Mr Brain Wo Contrast  12/09/2012   CLINICAL DATA:  Recent onset of  bilateral infarcts. Acute onset of left hand decreased sensation and weakness. The symptoms have since resolved.  EXAM: MRI HEAD WITHOUT CONTRAST  TECHNIQUE: Multiplanar, multiecho pulse sequences of the brain and surrounding structures were obtained without intravenous contrast.  COMPARISON:  CT head without contrast from the same day. MRI head without contrast 12/03/2012.  FINDINGS: The previously seen high right frontal lobe infarct is again seen. There is a new linear area of restricted diffusion involving the right frontal lobe along the primary sensory cortex measuring 12 mm. A 2nd area of new cortical infarction is noted along the right frontal operculum, measuring 11 mm.  T2 changes are  present with the subacute infarcts.  Remote lacunar infarcts are present within the basal ganglia bilaterally. Periventricular and subcortical white matter changes are otherwise stable bilaterally.  Flow is present in the major intracranial arteries. The globes and orbits are intact. The in the right maxillary sinuses chronically opacified. Circumferential mucosal thickening is again noted in the left maxillary sinus. Diffuse ethmoid disease has slightly progressed. The mastoid air cells are clear.  IMPRESSION: 1. 2 new areas of cortical infarction involving the right frontal operculum and the right parietal lobe. 2. The areas of previously seen acute infarction demonstrate expected change. 3. No evidence for hemorrhage or mass. 4. Remote lacunar infarcts of the basal ganglia bilaterally. 5. Diffuse sinus disease.   Electronically Signed   By: Gennette Pac M.D.   On: 12/09/2012 18:15    Therapy Recommendations continue ASA 325 mg daily  Physical Exam:   Filed Vitals:   12/09/12 2213 12/10/12 0149 12/10/12 0505 12/10/12 1023  BP: 152/71 149/65 145/66 153/63  Pulse: 49 50 52 53  Temp: 98.7 F (37.1 C) 97.9 F (36.6 C) 97.8 F (36.6 C) 97.8 F (36.6 C)  TempSrc: Oral Oral Oral Oral  Resp: 16 18 16 18   Height:       Weight:      SpO2: 98% 96% 97% 97%    General: Not in acute distress HEENT: PERRL, EOMI, no scleral icterus, No JVD or bruit Cardiac: S1/S2, RRR, No murmurs, gallops or rubs Pulm: Good air movement bilaterally. Clear to auscultation bilaterally. No rales, wheezing, rhonchi or rubs. Abd: Soft, nondistended, nontender, no rebound pain, no organomegaly, BS present Ext: No edema. 2+DP/PT pulse bilaterally Musculoskeletal: No joint deformities, erythema, or stiffness, ROM full Skin: No rashes.  Psych: Patient is not psychotic, no suicidal or hemocidal ideation.  Mental Status: Alert, oriented, thought content appropriate.  Speech dysarthric without evidence of aphasia.  Able to follow 3 step commands without difficulty. Cranial Nerves: II: Discs flat bilaterally; Visual fields grossly normal, pupils equal, round, reactive to light and accommodation III,IV, VI: ptosis not present, extra-ocular motions intact bilaterally V,VII: smile asymmetric with right facial droop. He could not raise her eyebrow on the right which is consistent with previous Bell's palsy. VIII: hearing normal bilaterally IX,X: gag reflex present XI: bilateral shoulder shrug XII: midline tongue extension without atrophy or fasciculations  Motor: Right :  Upper extremity   5/5                                       Left:     Upper extremity   5/5             Lower extremity   5/5                                                  Lower extremity   5/5 Tone and bulk:normal tone throughout; no atrophy noted Sensory: Pinprick and light touch intact throughout, bilaterally Deep Tendon Reflexes:   Right: Upper Extremity                        Left: Upper extremity   biceps (C-5 to C-6) 2/4  biceps (C-5 to C-6) 2/4 Brachioradialis (C6) 2/4                      Brachioradialis (C6) 2/4  Lower Extremity Lower Extremity   quadriceps (L-2 to L-4) 2/4                 quadriceps (L-2 to L-4) 2/4 Achilles  (S1) 0/4                                  Achilles (S1) 0/4  ASSESSMENT Patient is an 77 y.o. male who has been in the hospital 12-29-2008 for bilateral CVA likely cardio embolic.  Patient was again recently seen on 12/01/12 for cortical right frontal lobe ischemic infarcts. Patient returns today for acute onset of left hand decreased sensation and weakness. By time of consult symptoms had almost fully resolved. NIHSS:  2.  tPA was not given due to recent CVA and symptoms rapidly resolving. Infarct felt to be embolic secondary to ICA stenosis.  On aspirin 325 mg orally every day prior to admission. Now on aspirin 325 mg orally every day for secondary stroke prevention. Patient with resultant left hand weakness and decreased sensation. NIHSS was 2.  Patient had three episodes of short lasting left hand jerking movements per family. It is not sure whether patient had seizures or new episodes of TIA.  - CT-head:  No intracranial hemorrhage - MRI: No evidence for hemorrhage or mass. Two new areas of cortical infarction involving the right frontal operculum and the right parietal lobe. Remote lacunar infarcts of the basal ganglia bilaterally. - 2D Echocardiogram 12/02/12:  EF 55%, wall motion normal. No cardiac source of emboli identified.   - Carotid Doppler 12/02/12: Right: 1-39% ICA stenosis. Left: 40-59% ICA stenosis. Vertebral artery flow is antegrade - LDL 88 - A1c: 5.9  Hospital day # 1  TREATMENT/PLAN  Continue  aspirin 325 mg orally every day for secondary stroke prevention.  Will get EEG. If positive for seizure, will start seizure medication.     Lorretta Harp, MD PGY3, Internal Medicine Teaching Service Pager: [phone]   I have personally obtained a history, examined the patient, evaluated imaging results, and formulated the assessment and plan of care. I agree with the above. Delia Heady, MD

## 2012-12-10 NOTE — Progress Notes (Signed)
UR Completed.  Patient changed to IP status r/t new CVA on MRI

## 2012-12-10 NOTE — Evaluation (Signed)
Clinical/Bedside Swallow Evaluation Patient Details  Name: Casey Collins MRN: [id number] Date of Birth: 1935-10-17  Today's Date: 12/10/2012 Time: 4098-1191 SLP Time Calculation (min): 16 min  Past Medical History:  Past Medical History  Diagnosis Date  . Stroke     Right side   . Diabetes mellitus   . Arthritis   . Hypertension   . Hyperlipemia   . Diverticulosis   . Colon polyp    Past Surgical History:  Past Surgical History  Procedure Laterality Date  . Bilateral inguinal hernia     HPI:  Casey Collins is a 77 y.o. male with history of DM, hypertension, dyslipidemia, history of CVA diagnosed on 12/01/2012. According to the family (patient is unable to provide history due to aphasia) the patient was having perioral numbness, expressive aphasia, and difficulty swallowing. He symptoms started approximately at 1:45 PM. The family noted that over the last few days he has had some mild expressive aphasia and left-handed numbness which lasted only minutes at a time. These symptoms return today at approximately 1:45 PM and persisted and as a result the patient presented to the ED.  MRI shows 2 new areas of cortical infarction involving the right frontal operculum and the right parietal lobe. as well as old high right frontal infarct. MBS from last admit 12/02/12 recommend Dys 1 (puree) and honey thick liquids with a chin tuck and potential for an upgrade to nectar thick liqudis. Sensed aspiration of thin with weak, ineffective cough.   Assessment / Plan / Recommendation Clinical Impression  Pt demonstrates function consistent with findings of MBS on 11/4, also consistent with family's report of function at home. Pt tolerates honey thick liquids via teaspoon with min verbal cues for timing of chin tuck. There is minimal oral residual noted and no immediate signs of aspiration. Pt does however have delayed cough response (3-5 minutes) following consumption of honey and purees, present at  baseline and not too concerning given pts adequate lung function. For now recommend pt initate a Dys 1 (puree) honey thick liquid diet with full supervision, pills crushed and a chin tuck. Recommend MBS tomorrow to objectively evaluate tolerance of diet and asses potential for upgrade from honey thick texture prior to d/c. Pt will need continued f/u with home health SLP.     Aspiration Risk  Moderate    Diet Recommendation Honey-thick liquid;Dysphagia 1 (Puree)   Liquid Administration via: Cup;Spoon Medication Administration: Crushed with puree Supervision: Patient able to self feed;Full supervision/cueing for compensatory strategies Compensations: Slow rate;Small sips/bites Postural Changes and/or Swallow Maneuvers: Seated upright 90 degrees;Upright 30-60 min after meal;Chin tuck    Other  Recommendations Recommended Consults: MBS Oral Care Recommendations: Oral care before and after PO Other Recommendations: Order thickener from pharmacy   Follow Up Recommendations  Home health SLP    Frequency and Duration min 2x/week  2 weeks   Pertinent Vitals/Pain NA    SLP Swallow Goals     Swallow Study Prior Functional Status  Type of Home: House  Lives With: Family Available Help at Discharge: Family;Available 24 hours/day    General HPI: Casey Collins is a 77 y.o. male with history of DM, hypertension, dyslipidemia, history of CVA diagnosed on 12/01/2012. According to the family (patient is unable to provide history due to aphasia) the patient was having perioral numbness, expressive aphasia, and difficulty swallowing. He symptoms started approximately at 1:45 PM. The family noted that over the last few days he has had some mild  expressive aphasia and left-handed numbness which lasted only minutes at a time. These symptoms return today at approximately 1:45 PM and persisted and as a result the patient presented to the ED.  MRI shows 2 new areas of cortical infarction involving the  right frontal operculum and the right parietal lobe. as well as old high right frontal infarct. MBS from last admit 12/02/12 recommend Dys 1 (puree) and honey thick liquids with a chin tuck and potential for an upgrade to nectar thick liqudis. Sensed aspiration of thin with weak, ineffective cough. Type of Study: Bedside swallow evaluation Previous Swallow Assessment: see HPI Diet Prior to this Study: NPO Temperature Spikes Noted: No Respiratory Status: Room air History of Recent Intubation: No Behavior/Cognition: Alert;Cooperative;Pleasant mood Oral Cavity - Dentition: Adequate natural dentition Self-Feeding Abilities: Able to feed self Patient Positioning: Upright in bed Baseline Vocal Quality: Low vocal intensity;Wet Volitional Cough: Weak Volitional Swallow: Able to elicit    Oral/Motor/Sensory Function Overall Oral Motor/Sensory Function: Impaired at baseline Labial ROM: Reduced right Labial Symmetry: Abnormal symmetry right Labial Strength: Reduced Labial Sensation: Reduced Lingual ROM: Reduced right Lingual Symmetry: Within Functional Limits Lingual Strength: Reduced Facial ROM: Reduced right Facial Symmetry: Right droop;Right drooping eyelid Facial Strength: Reduced Facial Sensation: Reduced Velum: Within Functional Limits Mandible: Within Functional Limits   Ice Chips Ice chips: Not tested   Thin Liquid Thin Liquid: Not tested    Nectar Thick Nectar Thick Liquid: Not tested   Honey Thick Honey Thick Liquid: Impaired Presentation: Spoon;Cup Pharyngeal Phase Impairments: Suspected delayed Swallow;Cough - Delayed   Puree Puree: Impaired Presentation: Spoon;Self Fed Pharyngeal Phase Impairments: Suspected delayed Swallow;Cough - Delayed   Solid   GO    Solid: Not tested      Harlon Ditty, MA CCC-SLP [phone]  Hulen Mandler, Riley Nearing 12/10/2012,10:32 AM

## 2012-12-11 ENCOUNTER — Inpatient Hospital Stay (HOSPITAL_COMMUNITY): Payer: Medicare Other

## 2012-12-11 DIAGNOSIS — I10 Essential (primary) hypertension: Secondary | ICD-10-CM

## 2012-12-11 DIAGNOSIS — E875 Hyperkalemia: Secondary | ICD-10-CM

## 2012-12-11 DIAGNOSIS — I6350 Cerebral infarction due to unspecified occlusion or stenosis of unspecified cerebral artery: Secondary | ICD-10-CM | POA: Diagnosis not present

## 2012-12-11 DIAGNOSIS — R1310 Dysphagia, unspecified: Secondary | ICD-10-CM

## 2012-12-11 DIAGNOSIS — E119 Type 2 diabetes mellitus without complications: Secondary | ICD-10-CM | POA: Diagnosis present

## 2012-12-11 DIAGNOSIS — R4789 Other speech disturbances: Secondary | ICD-10-CM

## 2012-12-11 DIAGNOSIS — R4702 Dysphasia: Secondary | ICD-10-CM | POA: Diagnosis present

## 2012-12-11 DIAGNOSIS — G459 Transient cerebral ischemic attack, unspecified: Secondary | ICD-10-CM | POA: Diagnosis not present

## 2012-12-11 LAB — BASIC METABOLIC PANEL
BUN: 18 mg/dL (ref 6–23)
CO2: 24 mEq/L (ref 19–32)
Calcium: 8.7 mg/dL (ref 8.4–10.5)
Chloride: 104 mEq/L (ref 96–112)
GFR calc Af Amer: 65 mL/min — ABNORMAL LOW (ref >90)
GFR calc non Af Amer: 56 mL/min — ABNORMAL LOW (ref >90)
Sodium: 137 mEq/L (ref 135–145)

## 2012-12-11 LAB — GLUCOSE, CAPILLARY
Glucose-Capillary: 125 mg/dL — ABNORMAL HIGH (ref 70–99)
Glucose-Capillary: 192 mg/dL — ABNORMAL HIGH (ref 70–99)

## 2012-12-11 MED ORDER — METOPROLOL TARTRATE 25 MG PO TABS
25.0000 mg | ORAL_TABLET | Freq: Two times a day (BID) | ORAL | Status: DC
  Filled 2012-12-11: qty 1

## 2012-12-11 NOTE — Care Management Note (Signed)
    Page 1 of 1   12/11/2012     3:16:58 PM   CARE MANAGEMENT NOTE 12/11/2012  Patient:  Casey Collins, Casey Collins   Account Number:  [id number]  Date Initiated:  12/11/2012  Documentation initiated by:  Elmer Bales  Subjective/Objective Assessment:   Patient admitted with CVA. Lives at home with family     Action/Plan:   Will follow for discharge needs.   Anticipated DC Date:  12/11/2012   Anticipated DC Plan:  HOME W HOME HEALTH SERVICES      DC Planning Services  CM consult      Choice offered to / List presented to:  C-1 Patient        HH arranged  HH-2 PT  HH-5 SPEECH THERAPY      HH agency  [facility]   Status of service:  Completed, signed off Medicare Important Message given?   (If response is "NO", the following Medicare IM given date fields will be blank) Date Medicare IM given:   Date Additional Medicare IM given:    Discharge Disposition:  HOME W HOME HEALTH SERVICES  Per UR Regulation:  Reviewed for med. necessity/level of care/duration of stay  If discussed at Long Length of Stay Meetings, dates discussed:    Comments:  12/11/12 1500 Elmer Bales RN, MSN, CM- Met with patient and family to verify that they are interested in resuming home health care throught [facility].  Mary with [facility] was notified that patient is discharging home today.

## 2012-12-11 NOTE — Evaluation (Signed)
Occupational Therapy Evaluation Patient Details Name: Casey Collins MRN: [id number] DOB: [birth year] Today's Date: 12/11/2012 Time: 9562-1308 OT Time Calculation (min): 21 min  OT Assessment / Plan / Recommendation History of present illness pt presents with concern for new CVA with recent hx of multiple CVAs.     Clinical Impression   Pt is a pleasant gentleman with history of recent CVA just over one week ago and now questionable new CVA.  He presents at a supervision to min guard assist level for selfcare tasks and functional transfers.  Needs use of assistive device to help compensate for balance issues.  He does report using grab bars at the toilet and in the tub, however he tends to furniture walk instead of using a cane as he states he doesn't like it.  Will have 24 hour supervision at discharge.  Issued family handout on FM coordination and gross motor coordination exercises to help.  Pt with no further OT needs at this time.  HHPT to address ongoing balance and RLE weakness.    OT Assessment  Patient does not need any further OT services    Follow Up Recommendations  No OT follow up;Supervision/Assistance - 24 hour       Equipment Recommendations  None recommended by OT          Precautions / Restrictions Precautions Precautions: Fall Restrictions Weight Bearing Restrictions: No   Pertinent Vitals/Pain Slight abdominal pain reported but not limiting.    ADL  Eating/Feeding: Simulated;Modified independent Where Assessed - Eating/Feeding: Chair Grooming: Performed;Wash/dry hands;Min guard Where Assessed - Grooming: Unsupported standing Upper Body Bathing: Simulated;Set up Where Assessed - Upper Body Bathing: Unsupported sitting Lower Body Bathing: Simulated;Supervision/safety Where Assessed - Lower Body Bathing: Unsupported sit to stand Upper Body Dressing: Simulated;Set up Where Assessed - Upper Body Dressing: Unsupported sitting Lower Body Dressing:  Performed Where Assessed - Lower Body Dressing: Unsupported sit to stand Toilet Transfer: Min guard Toilet Transfer Method: Other (comment) (ambulate without assistive device) Toilet Transfer Equipment: Comfort height toilet;Grab bars Toileting - Clothing Manipulation and Hygiene: Performed;Supervision/safety Where Assessed - Engineer, mining and Hygiene: Sit to stand from 3-in-1 or toilet Tub/Shower Transfer Method: Not assessed Equipment Used: Gait belt Transfers/Ambulation Related to ADLs: Pt currently min guard assist level with mobility without use of assistive device. ADL Comments: Pt appears to be at his baseline from previous CVA.  Decreased dynamic balance issues which he has DME in place for showering and toileting to help compensate for.  Educated pt and pt's family on performance of FM and gross motor coordination exercises and provided handout as well.  No further acute OT needs.  Pt is discharging home today with continued HHPT for balance and RLE strengthening.      Visit Information  Last OT Received On: 12/11/12 Assistance Needed: +1 History of Present Illness: pt presents with concern for new CVA with recent hx of multiple CVAs.         Prior Functioning     Home Living Family/patient expects to be discharged to:: Private residence Living Arrangements: Children Available Help at Discharge: Family;Available 24 hours/day Type of Home: House Home Access: Ramped entrance;Stairs to enter Entrance Stairs-Number of Steps: 3 Entrance Stairs-Rails: Right Home Layout: One level Home Equipment: Tub bench;Grab bars - toilet;Grab bars - tub/shower;Hand held shower head;Bedside commode Additional Comments: Per daughter pt refuses AD.    Lives With: Family Prior Function Level of Independence: Needs assistance ADL's / Homemaking Assistance Needed: Family set-up of tub bench,  otherwise Mod I with bathing and dressing.  Family performs homemaking.    Communication Communication: Expressive difficulties         Vision/Perception Vision - History Baseline Vision: Wears glasses all the time Patient Visual Report: No change from baseline Vision - Assessment Eye Alignment: Impaired (comment) Vision Assessment: Vision tested Ocular Range of Motion: Within Functional Limits Tracking/Visual Pursuits: Able to track stimulus in all quads without difficulty Additional Comments: Pt with history of Bell's palsy resulting in right eye lid droop. Perception Perception: Within Functional Limits Praxis Praxis: Intact   Cognition  Cognition Arousal/Alertness: Awake/alert Behavior During Therapy: WFL for tasks assessed/performed Overall Cognitive Status: Within Functional Limits for tasks assessed    Extremity/Trunk Assessment Upper Extremity Assessment Upper Extremity Assessment: RUE deficits/detail RUE Deficits / Details: Pt with strength 5/5 throughout.  Slight dysmetria noted with finger to nose but pt able to tie his hospital gown with increased time. RUE Coordination: decreased fine motor LUE Deficits / Details: No deficits noted Lower Extremity Assessment Lower Extremity Assessment: Defer to PT evaluation Cervical / Trunk Assessment Cervical / Trunk Assessment: Normal     Mobility Bed Mobility Bed Mobility: Supine to Sit Supine to Sit: HOB flat;6: Modified independent (Device/Increase time) Sit to Supine: 6: Modified independent (Device/Increase time);HOB flat Transfers Transfers: Sit to Stand Sit to Stand: 5: Supervision;With upper extremity assist;From bed Stand to Sit: 5: Supervision;With upper extremity assist;To bed        Balance Balance Balance Assessed: Yes Static Standing Balance Static Standing - Balance Support: No upper extremity supported Static Standing - Level of Assistance: 5: Stand by assistance Dynamic Standing Balance Dynamic Standing - Balance Support: No upper extremity supported Dynamic  Standing - Level of Assistance: 4: Min assist High Level Balance High Level Balance Comments: Pt able to retrieve item from the floor with min guard assist.  Did demonstrate one LOB when taking initial first steps after standing from the bed.     End of Session OT - End of Session Equipment Utilized During Treatment: Gait belt Activity Tolerance: Patient tolerated treatment well Patient left: with nursing/sitter in room;with family/visitor present;in bed Nurse Communication: Mobility status     Betzabeth Derringer,Tyris OTR/L 12/11/2012, 10:53 AM

## 2012-12-11 NOTE — Progress Notes (Signed)
Stroke Team Progress Note  HISTORY  Casey Collins is an 77 y.o. male who has been recently seen in the hospital 12-29-2008 for bilateral CVA likely cardio embolic.  Patient was again recently seen on 12/01/12 for cortical right frontal lobe ischemic infarcts.  At that time he was on Plavix daily. At time of discharge he was changed to ASA due to dysphagia and needing pill to be crushed.  Patient returns today for acute onset of left hand decreased sensation and weakness. By time of consult symptoms had almost fully resolved. NIHSS:  2   tPA was not given due to recent CVA and symptoms rapidly resolving.  SUBJECTIVE Overall he feels his condition is gradually improving. No chest pain or SOB. No fever or chills. His left hand weakness resolved.  Patient did not have new episodes of hand jerking movements per family. Her EEG is negative.  OBJECTIVE Most recent Vital Signs: Filed Vitals:   12/10/12 2125 12/11/12 0138 12/11/12 0555 12/11/12 0902  BP: 134/52 163/66 156/73 136/74  Pulse: 62 67 51 71  Temp: 98.4 F (36.9 C) 97.7 F (36.5 C) 97.9 F (36.6 C) 98 F (36.7 C)  TempSrc: Oral Oral Oral Oral  Resp: 18 18 18 18   Height:      Weight:      SpO2: 98% 99% 99% 97%   CBG (last 3)   Recent Labs  12/10/12 2103 12/10/12 2259 12/11/12 0657  GLUCAP 70 158* 125*    IV Fluid Intake:      MEDICATIONS  . aspirin  325 mg Oral Daily  . citalopram  10 mg Oral Daily  . heparin  5,000 Units Subcutaneous Q8H  . insulin aspart  0-15 Units Subcutaneous TID WC  . insulin aspart  5 Units Subcutaneous Daily  . insulin glargine  10 Units Subcutaneous BID  . simvastatin  40 mg Oral QHS   PRN:  acetaminophen, LORazepam, RESOURCE THICKENUP CLEAR, senna-docusate, simethicone, zolpidem  Diet:   Dys 1 diet Activity:  Bedrest DVT Prophylaxis:  SQ heparin  CLINICALLY SIGNIFICANT STUDIES Basic Metabolic Panel:   Recent Labs Lab 12/10/12 0900 12/11/12 0603  NA 138 137  K 4.4 4.3  CL 102  104  CO2 25 24  GLUCOSE 109* 133*  BUN 20 18  CREATININE 1.38* 1.23  CALCIUM 9.6 8.7   Liver Function Tests:   Recent Labs Lab 12/09/12 1436  AST 25  ALT 28  ALKPHOS 72  BILITOT 0.4  PROT 7.2  ALBUMIN 3.8   CBC:   Recent Labs Lab 12/09/12 1436 12/09/12 1531  WBC 10.3  --   NEUTROABS 6.1  --   HGB 12.5* 14.3  HCT 37.8* 42.0  MCV 93.3  --   PLT 196  --    Coagulation:   Recent Labs Lab 12/09/12 1436  LABPROT 13.3  INR 1.03   Cardiac Enzymes:   Recent Labs Lab 12/09/12 1436  TROPONINI <0.30   Urinalysis:   Recent Labs Lab 12/09/12 1651  COLORURINE YELLOW  LABSPEC 1.020  PHURINE 7.5  GLUCOSEU NEGATIVE  HGBUR NEGATIVE  BILIRUBINUR NEGATIVE  KETONESUR NEGATIVE  PROTEINUR NEGATIVE  UROBILINOGEN 1.0  NITRITE NEGATIVE  LEUKOCYTESUR NEGATIVE   Lipid Panel    Component Value Date/Time   CHOL 140 12/02/2012 0638   TRIG 89 12/02/2012 0638   HDL 34* 12/02/2012 0638   CHOLHDL 4.1 12/02/2012 0638   VLDL 18 12/02/2012 0638   LDLCALC 88 12/02/2012 0638   HgbA1C  Lab Results  Component Value Date   HGBA1C 5.9* 12/02/2012    Urine Drug Screen:     Component Value Date/Time   LABOPIA NONE DETECTED 12/09/2012 1651   COCAINSCRNUR NONE DETECTED 12/09/2012 1651   LABBENZ NONE DETECTED 12/09/2012 1651   AMPHETMU NONE DETECTED 12/09/2012 1651   THCU POSITIVE* 12/09/2012 1651   LABBARB NONE DETECTED 12/09/2012 1651    Alcohol Level:   Recent Labs Lab 12/09/12 1436  ETH <11    Dg Chest 2 View  12/10/2012   CLINICAL DATA:  Recent CVA.  EXAM: CHEST  2 VIEW  COMPARISON:  Chest radiograph performed 01/04/2009  FINDINGS: The lungs are well-aerated and clear. There is no evidence of focal opacification, pleural effusion or pneumothorax. Vague density near the right lung base is thought to reflect overlying soft tissues.  The heart is borderline normal in size; the mediastinal contour is within normal limits. No acute osseous abnormalities are seen.   IMPRESSION: No acute cardiopulmonary process seen.   Electronically Signed   By: Roanna Raider M.D.   On: 12/10/2012 02:09   Ct Head Wo Contrast  12/09/2012   CLINICAL DATA:  History of Bell's palsy. Left-sided deficits. Diabetic hypertensive hyperlipidemia patient.  EXAM: CT HEAD WITHOUT CONTRAST  TECHNIQUE: Contiguous axial images were obtained from the base of the skull through the vertex without intravenous contrast.  COMPARISON:  12/03/2012 MR. 12/01/2012 CT.  FINDINGS: No intracranial hemorrhage.  Recent (12/03/2012) right frontal/ frontal operculum infarct as noted on recent MR. No clear extension of the infarct by CT. Although subtle extension would be better defined by a followup MR if this would change the patient's course of therapy.  Remote bilateral basal ganglia infarcts.  Small vessel disease type changes.  Atrophy without hydrocephalus.  No intracranial mass lesion noted on this unenhanced exam.  Vascular calcifications.  Mucosal thickening maxillary sinuses with polypoid appearance on the right. Mucosal thickening/ opacification ethmoid sinus air cells.  IMPRESSION: No intracranial hemorrhage.  Recent (12/03/2012) right frontal/ frontal operculum infarct as noted on recent MR. No clear extension of the infarct by CT.  Remote bilateral basal ganglia infarcts.  Small vessel disease type changes.  Mucosal thickening maxillary sinuses with polypoid appearance on the right. Mucosal thickening/ opacification ethmoid sinus air cells.  These results were called by telephone at the time of interpretation on 12/09/2012 at 2:51 PM to Dr. Noel Christmas , who verbally acknowledged these results.   Electronically Signed   By: Bridgett Larsson M.D.   On: 12/09/2012 14:52   Mr Brain Wo Contrast  12/09/2012   CLINICAL DATA:  Recent onset of bilateral infarcts. Acute onset of left hand decreased sensation and weakness. The symptoms have since resolved.  EXAM: MRI HEAD WITHOUT CONTRAST  TECHNIQUE: Multiplanar,  multiecho pulse sequences of the brain and surrounding structures were obtained without intravenous contrast.  COMPARISON:  CT head without contrast from the same day. MRI head without contrast 12/03/2012.  FINDINGS: The previously seen high right frontal lobe infarct is again seen. There is a new linear area of restricted diffusion involving the right frontal lobe along the primary sensory cortex measuring 12 mm. A 2nd area of new cortical infarction is noted along the right frontal operculum, measuring 11 mm.  T2 changes are present with the subacute infarcts.  Remote lacunar infarcts are present within the basal ganglia bilaterally. Periventricular and subcortical white matter changes are otherwise stable bilaterally.  Flow is present in the major intracranial arteries. The globes and orbits are intact.  The in the right maxillary sinuses chronically opacified. Circumferential mucosal thickening is again noted in the left maxillary sinus. Diffuse ethmoid disease has slightly progressed. The mastoid air cells are clear.  IMPRESSION: 1. 2 new areas of cortical infarction involving the right frontal operculum and the right parietal lobe. 2. The areas of previously seen acute infarction demonstrate expected change. 3. No evidence for hemorrhage or mass. 4. Remote lacunar infarcts of the basal ganglia bilaterally. 5. Diffuse sinus disease.   Electronically Signed   By: Gennette Pac M.D.   On: 12/09/2012 18:15    Therapy Recommendations continue ASA 325 mg daily  Physical Exam:   Filed Vitals:   12/10/12 2125 12/11/12 0138 12/11/12 0555 12/11/12 0902  BP: 134/52 163/66 156/73 136/74  Pulse: 62 67 51 71  Temp: 98.4 F (36.9 C) 97.7 F (36.5 C) 97.9 F (36.6 C) 98 F (36.7 C)  TempSrc: Oral Oral Oral Oral  Resp: 18 18 18 18   Height:      Weight:      SpO2: 98% 99% 99% 97%    General: Not in acute distress HEENT: PERRL, EOMI, no scleral icterus, No JVD or bruit Cardiac: S1/S2, RRR, No murmurs,  gallops or rubs Pulm: Good air movement bilaterally. Clear to auscultation bilaterally. No rales, wheezing, rhonchi or rubs. Abd: Soft, nondistended, nontender, no rebound pain, no organomegaly, BS present Ext: No edema. 2+DP/PT pulse bilaterally Musculoskeletal: No joint deformities, erythema, or stiffness, ROM full Skin: No rashes.  Psych: Patient is not psychotic, no suicidal or hemocidal ideation.  Mental Status: Alert, oriented, thought content appropriate.  Speech dysarthric without evidence of aphasia.  Able to follow 3 step commands without difficulty. Cranial Nerves: II: Discs flat bilaterally; Visual fields grossly normal, pupils equal, round, reactive to light and accommodation III,IV, VI: ptosis not present, extra-ocular motions intact bilaterally V,VII: smile asymmetric with right facial droop. He could not raise her eyebrow on the right which is consistent with previous Bell's palsy. VIII: hearing normal bilaterally IX,X: gag reflex present XI: bilateral shoulder shrug XII: midline tongue extension without atrophy or fasciculations  Motor: Right :  Upper extremity   5/5                                       Left:     Upper extremity   5/5             Lower extremity   5/5                                                  Lower extremity   5/5 Tone and bulk:normal tone throughout; no atrophy noted Sensory: Pinprick and light touch intact throughout, bilaterally Deep Tendon Reflexes:   Right: Upper Extremity                        Left: Upper extremity   biceps (C-5 to C-6) 2/4                       biceps (C-5 to C-6) 2/4 Brachioradialis (C6) 2/4                      Brachioradialis (C6) 2/4  Lower Extremity Lower Extremity   quadriceps (L-2 to L-4) 2/4                 quadriceps (L-2 to L-4) 2/4 Achilles (S1) 0/4                                  Achilles (S1) 0/4  ASSESSMENT Patient is an 77 y.o. male who has been in the hospital 12-29-2008 for bilateral CVA likely  cardio embolic.  Patient was again recently seen on 12/01/12 for cortical right frontal lobe ischemic infarcts. Patient returns today for acute onset of left hand decreased sensation and weakness. By time of consult symptoms had almost fully resolved. NIHSS:  2.  tPA was not given due to recent CVA and symptoms rapidly resolving. Infarct felt to be embolic secondary to ICA stenosis.  On aspirin 325 mg orally every day prior to admission. Now on aspirin 325 mg orally every day for secondary stroke prevention. Patient with resultant left hand weakness and decreased sensation. NIHSS was 2.  Patient had three episodes of short lasting left hand jerking movements per family. It is not sure whether patient had seizures or new episodes of TIA.  - EEG: Normal EEG recording during wakefulness and during drowsiness. No evidence of an epileptic disorder is demonstrated. - CT-head:  No intracranial hemorrhage - MRI: No evidence for hemorrhage or mass. Two new areas of cortical infarction involving the right frontal operculum and the right parietal lobe. Remote lacunar infarcts of the basal ganglia bilaterally. - 2D Echocardiogram 12/02/12:  EF 55%, wall motion normal. No cardiac source of emboli identified.   - Carotid Doppler 12/02/12: Right: 1-39% ICA stenosis. Left: 40-59% ICA stenosis. Vertebral artery flow is antegrade - LDL 88 - A1c: 5.9 - K and Cre are normal today  Hospital day # 2  TREATMENT/PLAN  Continue  aspirin 325 mg orally every day for secondary stroke prevention.  Patient's EEG is negative for seizure, will not start seizure medication.   Patient can be discharged home today  Neurology will sign off today. Dr. Pearlean Brownie will followup patient in clinic in 2-3 months.    Lorretta Harp, MD PGY3, Internal Medicine Teaching Service Pager: [phone]   I have personally obtained a history, examined the patient, evaluated imaging results, and formulated the assessment and plan of care. I agree with the  above. Delia Heady, MD

## 2012-12-11 NOTE — Procedures (Signed)
Objective Swallowing Evaluation: Modified Barium Swallowing Study  Patient Details  Name: QUANTRELL SPLITT MRN: [id number] Date of Birth: 03-Nov-1935  Today's Date: 12/11/2012 Time: 1115-1140 SLP Time Calculation (min): 25 min  Past Medical History:  Past Medical History  Diagnosis Date  . Stroke     Right side   . Diabetes mellitus   . Arthritis   . Hypertension   . Hyperlipemia   . Diverticulosis   . Colon polyp    Past Surgical History:  Past Surgical History  Procedure Laterality Date  . Bilateral inguinal hernia     HPI:  ALBERTUS CHIARELLI is a 77 y.o. male with history of DM, hypertension, dyslipidemia, history of CVA diagnosed on 12/01/2012. According to the family (patient is unable to provide history due to aphasia) the patient was having perioral numbness, expressive aphasia, and difficulty swallowing. He symptoms started approximately at 1:45 PM. The family noted that over the last few days he has had some mild expressive aphasia and left-handed numbness which lasted only minutes at a time. These symptoms return today at approximately 1:45 PM and persisted and as a result the patient presented to the ED.  MRI shows 2 new areas of cortical infarction involving the right frontal operculum and the right parietal lobe. as well as old high right frontal infarct. MBS from last admit 12/02/12 recommend Dys 1 (puree) and honey thick liquids with a chin tuck and potential for an upgrade to nectar thick liqudis. Sensed aspiration of thin with weak, ineffective cough.     Assessment / Plan / Recommendation Clinical Impression  Dysphagia Diagnosis: Moderate pharyngeal phase dysphagia;Mild oral phase dysphagia Clinical impression: Pt presents with significant improvement since last MBS. Oral phase is generally functional with trace to no oral residuals even with solids. Pt holds liquids orally sufficiently while completing chin tuck. Despite this, there is still a significant sensory  deficit leading to delay in swallow initiation with nectar thick liquids reaching pyriforms prior to the swallow. If chin tuck not done there is some trace penetration during the swallow as pooled liquids in pyriforms penetrate airway. With a chin tuck pt closes airway prior to transiting liquids. Trials of small very controlled sips of thin liquids result in trace silent penetration before the swallow. Recommend pt consume nectar thick liquids and mechanical soft solids (Dys 3) with a chin tuck. Pill can be given whole in puree. Pt also may have trials of small sips of water with full supervision from family to ensure small bolus size, complete chin tuck with a follow up throat clear. All other liquids should be nectar thick.    Treatment Recommendation  Therapy as outlined in treatment plan below    Diet Recommendation Dysphagia 3 (Mechanical Soft);Nectar-thick liquid   Liquid Administration via: Cup;Straw Medication Administration: Whole meds with puree Supervision: Patient able to self feed;Full supervision/cueing for compensatory strategies Compensations: Slow rate;Small sips/bites Postural Changes and/or Swallow Maneuvers: Seated upright 90 degrees;Chin tuck    Other  Recommendations Oral Care Recommendations: Oral care BID Other Recommendations: Order thickener from pharmacy   Follow Up Recommendations  Home health SLP    Frequency and Duration min 2x/week  2 weeks   Pertinent Vitals/Pain NA    SLP Swallow Goals     General HPI: ZORAN YANKEE is a 77 y.o. male with history of DM, hypertension, dyslipidemia, history of CVA diagnosed on 12/01/2012. According to the family (patient is unable to provide history due to aphasia) the patient was having  perioral numbness, expressive aphasia, and difficulty swallowing. He symptoms started approximately at 1:45 PM. The family noted that over the last few days he has had some mild expressive aphasia and left-handed numbness which lasted  only minutes at a time. These symptoms return today at approximately 1:45 PM and persisted and as a result the patient presented to the ED.  MRI shows 2 new areas of cortical infarction involving the right frontal operculum and the right parietal lobe. as well as old high right frontal infarct. MBS from last admit 12/02/12 recommend Dys 1 (puree) and honey thick liquids with a chin tuck and potential for an upgrade to nectar thick liqudis. Sensed aspiration of thin with weak, ineffective cough. Type of Study: Modified Barium Swallowing Study Reason for Referral: Objectively evaluate swallowing function Previous Swallow Assessment: see HPI Diet Prior to this Study: Dysphagia 1 (puree);Honey-thick liquids Temperature Spikes Noted: No Respiratory Status: Room air History of Recent Intubation: No Behavior/Cognition: Alert;Cooperative;Pleasant mood Oral Cavity - Dentition: Adequate natural dentition Oral Motor / Sensory Function: Impaired - see Bedside swallow eval Self-Feeding Abilities: Able to feed self Patient Positioning: Upright in chair Baseline Vocal Quality: Clear;Low vocal intensity Volitional Cough: Weak Volitional Swallow: Able to elicit Anatomy: Within functional limits Pharyngeal Secretions: Not observed secondary MBS    Reason for Referral Objectively evaluate swallowing function   Oral Phase Oral Preparation/Oral Phase Oral Phase: WFL (trace to mild residue) Oral - Honey Oral - Honey Teaspoon: Not tested Oral - Nectar Oral - Nectar Teaspoon: Not tested Oral - Nectar Cup: Within functional limits (trace residue) Oral - Thin Oral - Thin Cup: Within functional limits Oral - Solids Oral - Puree: Within functional limits Oral - Regular: Within functional limits   Pharyngeal Phase Pharyngeal Phase Pharyngeal Phase: Impaired Pharyngeal - Honey Pharyngeal - Honey Teaspoon: Not tested Pharyngeal - Nectar Pharyngeal - Nectar Teaspoon: Not tested Pharyngeal - Nectar Cup:  Premature spillage to pyriform sinuses;Penetration/Aspiration during swallow (improved with chin tuck) Penetration/Aspiration details (nectar cup): Material enters airway, remains ABOVE vocal cords then ejected out;Material does not enter airway Pharyngeal - Nectar Straw: Delayed swallow initiation;Reduced pharyngeal peristalsis Pharyngeal - Thin Pharyngeal - Thin Cup: Delayed swallow initiation;Penetration/Aspiration before swallow Penetration/Aspiration details (thin cup): Material enters airway, passes BELOW cords without attempt by patient to eject out (silent aspiration);Material enters airway, CONTACTS cords and not ejected out;Material does not enter airway Pharyngeal - Solids Pharyngeal - Puree: Delayed swallow initiation Penetration/Aspiration details (puree): Material does not enter airway Pharyngeal - Mechanical Soft: Delayed swallow initiation Pharyngeal - Regular: Delayed swallow initiation Pharyngeal - Pill: Delayed swallow initiation;Penetration/Aspiration before swallow Penetration/Aspiration details (pill): Material enters airway, passes BELOW cords without attempt by patient to eject out (silent aspiration)  Cervical Esophageal Phase    GO             Harlon Ditty, MA CCC-SLP [phone]  Claudine Mouton 12/11/2012, 1:21 PM

## 2012-12-11 NOTE — Progress Notes (Signed)
PT Cancellation Note  Patient Details Name: Casey Collins MRN: [id number] DOB: 1935-11-05   Cancelled Treatment:    Reason Eval/Treat Not Completed: Patient at procedure or test/unavailable. Patient at swallow test. Will reattempt as able. Patient planning to DC today.    Fredrich Birks 12/11/2012, 11:25 AM 12/11/2012 Fredrich Birks PTA [phone] pager [phone] office

## 2012-12-11 NOTE — Discharge Summary (Signed)
Physician Discharge Summary  HICKS FEICK ZOX:[id number] DOB: [birth year] DOA: 12/09/2012  PCP: Minda Meo, MD  Admit date: 12/09/2012 Discharge date: 12/11/2012  Time spent: 35 minutes  Recommendations for Outpatient Follow-up:   CVA; 2 new areas of cortical infarction involving the right frontal  operculum and the right parietal lobe.  -Continue aspirin 325 mg orally every day for secondary stroke prevention -. Dr. Pearlean Brownie will followup patient in clinic in 2-3 months. -Spoke with patient and family at approximately 1520 and requested that they allow me to finish discharging current patient I was seen and checked on the results of their procedure. Informed family that  As of approximately 1300 results are not posted. PT DECIDED TO LEAVE AMA  Strokelike symptoms:  - Patient's EEG is negative for seizure, will not start seizure medication - Spoke with patient and family at approximately 1520 and requested that they allow me to finish discharging current patient I was seen and checked on the results of their procedure. Informed family that  As of approximately 1300 results are not posted. PT DECIDED TO LEAVE AMA  Hyperkalemia  - has resolved  -Will continue to her ramipril. PCP to restart -Spoke with patient and family at approximately 1520 and requested that they allow me to finish discharging current patient I was seen and checked on the results of their procedure. Informed family that  As of approximately 1300 results are not posted. PT DECIDED TO LEAVE AMA  DM  - Will plan on placing on diabetic diet  -Place on sliding scale insulin  -Monitor CBGs  - Continue home dose Lantus 10 units each bedtime  - Spoke with patient and family at approximately 1520 and requested that they allow me to finish discharging current patient I was seen and checked on the results of their procedure. Informed family that  As of approximately 1300 results are not posted. PT DECIDED TO LEAVE  AMA  Hypertension  - Uncontrolled BP restart metoprolol 25 mg BID -Spoke with patient and family at approximately 1520 and requested that they allow me to finish discharging current patient I was seen and checked on the results of their procedure. Informed family that  As of approximately 1300 results are not posted. PT DECIDED TO LEAVE AMA  Pharyngeal dysphagia -Dysphagia 3 (Mechanical Soft);Nectar-thick liquid      Discharge Diagnoses:  Active Problems:   Chronic anticoagulation   HTN (hypertension)   DM (dermatomyositis)   TIA (transient ischemic attack)   Renal insufficiency   Hyperkalemia   Discharge Condition: Stable  Diet recommendation:  Dysphagia 3 (Mechanical Soft);Nectar-thick liquid    Filed Weights   12/09/12 2000  Weight: 90 kg (198 lb 6.6 oz)    History of present illness:  Casey Collins is a 77 y.o. male PMHx DM, hypertension, dyslipidemia,Hx CVA diagnosed on 12/01/2012. According to the family (patient is unable to provide history due to aphasia) the patient was having perioral numbness, expressive aphasia, and difficulty swallowing. He symptoms started approximately at 1:45 PM. The family noted that over the last few days he has had some mild expressive aphasia and left-handed numbness which lasted only minutes at a time. These symptoms return today at approximately 1:45 PM and persisted and as a result the patient presented to the ED.  Neurology was consult at and evaluated patient. We were consult at given neurology's recommendations for inpatient evaluation for strokelike symptoms    Consultants:  Neurology   Procedures:   EEG 12/10/2012  This is  a normal EEG recording during wakefulness and during drowsiness. No evidence of an epileptic disorder is demonstrated.   Brain MRI 12/09/2012 IMPRESSION:  1. 2 new areas of cortical infarction involving the right frontal  operculum and the right parietal lobe.  2. The areas of previously seen acute  infarction demonstrate  expected change.  3. No evidence for hemorrhage or mass.  4. Remote lacunar infarcts of the basal ganglia bilaterally.  5. Diffuse sinus disease.   CT head 12/09/2012 No intracranial hemorrhage.  Recent (12/03/2012) right frontal/ frontal operculum infarct as  noted on recent MR. No clear extension of the infarct by CT.  Remote bilateral basal ganglia infarcts.  Small vessel disease type changes.   Echocardiogram 12/02/2012  -Transthoracic echocardiography.  - Left ventricle: mild LVH.  -Systolic function was normal.  -LVEF 55% to 60%. Wall motion was normal;  - (grade 1 diastolic dysfunction). - Aortic valve: Mild regurgitation. Impressions: - No cardiac source of emboli was indentified.   Antibiotics:  none   Discharge Exam: Filed Vitals:   12/10/12 2125 12/11/12 0138 12/11/12 0555 12/11/12 0902  BP: 134/52 163/66 156/73 136/74  Pulse: 62 67 51 71  Temp: 98.4 F (36.9 C) 97.7 F (36.5 C) 97.9 F (36.6 C) 98 F (36.7 C)  TempSrc: Oral Oral Oral Oral  Resp: 18 18 18 18   Height:      Weight:      SpO2: 98% 99% 99% 97%    General: Cardiovascular:  Respiratory:   Discharge Instructions     Medication List    ASK your doctor about these medications       aspirin 325 MG tablet  Take 1 tablet (325 mg total) by mouth daily.     citalopram 10 MG tablet  Commonly known as:  CELEXA  Take 10 mg by mouth daily.     LANTUS 100 UNIT/ML injection  Generic drug:  insulin glargine  Inject 10 Units into the skin 2 (two) times daily.     LORazepam 1 MG tablet  Commonly known as:  ATIVAN  Take 1 mg by mouth 2 (two) times daily as needed for anxiety.     metoprolol tartrate 25 MG tablet  Commonly known as:  LOPRESSOR  Take 25 mg by mouth 2 (two) times daily.     NOVOLOG 100 UNIT/ML injection  Generic drug:  insulin aspart  Inject 5 Units into the skin daily. Sliding scale     ramipril 5 MG capsule  Commonly known as:  ALTACE  Take 5  mg by mouth daily.     RESOURCE THICKENUP CLEAR Powd  Take 120 g by mouth as needed.     simvastatin 40 MG tablet  Commonly known as:  ZOCOR  Take 40 mg by mouth at bedtime.     zolpidem 10 MG tablet  Commonly known as:  AMBIEN  Take 10 mg by mouth at bedtime.       No Known Allergies    The results of significant diagnostics from this hospitalization (including imaging, microbiology, ancillary and laboratory) are listed below for reference.    Significant Diagnostic Studies: Dg Chest 2 View  12/10/2012   CLINICAL DATA:  Recent CVA.  EXAM: CHEST  2 VIEW  COMPARISON:  Chest radiograph performed 01/04/2009  FINDINGS: The lungs are well-aerated and clear. There is no evidence of focal opacification, pleural effusion or pneumothorax. Vague density near the right lung base is thought to reflect overlying soft tissues.  The heart is  borderline normal in size; the mediastinal contour is within normal limits. No acute osseous abnormalities are seen.  IMPRESSION: No acute cardiopulmonary process seen.   Electronically Signed   By: Roanna Raider M.D.   On: 12/10/2012 02:09   Ct Head Wo Contrast  12/09/2012   CLINICAL DATA:  History of Bell's palsy. Left-sided deficits. Diabetic hypertensive hyperlipidemia patient.  EXAM: CT HEAD WITHOUT CONTRAST  TECHNIQUE: Contiguous axial images were obtained from the base of the skull through the vertex without intravenous contrast.  COMPARISON:  12/03/2012 MR. 12/01/2012 CT.  FINDINGS: No intracranial hemorrhage.  Recent (12/03/2012) right frontal/ frontal operculum infarct as noted on recent MR. No clear extension of the infarct by CT. Although subtle extension would be better defined by a followup MR if this would change the patient's course of therapy.  Remote bilateral basal ganglia infarcts.  Small vessel disease type changes.  Atrophy without hydrocephalus.  No intracranial mass lesion noted on this unenhanced exam.  Vascular calcifications.  Mucosal  thickening maxillary sinuses with polypoid appearance on the right. Mucosal thickening/ opacification ethmoid sinus air cells.  IMPRESSION: No intracranial hemorrhage.  Recent (12/03/2012) right frontal/ frontal operculum infarct as noted on recent MR. No clear extension of the infarct by CT.  Remote bilateral basal ganglia infarcts.  Small vessel disease type changes.  Mucosal thickening maxillary sinuses with polypoid appearance on the right. Mucosal thickening/ opacification ethmoid sinus air cells.  These results were called by telephone at the time of interpretation on 12/09/2012 at 2:51 PM to Dr. Noel Christmas , who verbally acknowledged these results.   Electronically Signed   By: Bridgett Larsson M.D.   On: 12/09/2012 14:52   Ct Head Wo Contrast  12/01/2012   CLINICAL DATA:  77 year old male code stroke. Slurred speech and difficulty swallowing. Initial encounter.  EXAM: CT HEAD WITHOUT CONTRAST  TECHNIQUE: Contiguous axial images were obtained from the base of the skull through the vertex without intravenous contrast.  COMPARISON:  01/04/2009 and earlier.  FINDINGS: Increased paranasal sinus mucosal thickening and opacification. Mastoids and tympanic cavities remain clear. No acute osseous abnormality identified. Visualized orbits and scalp soft tissues are within normal limits.  Calcified atherosclerosis at the skull base. Interval evolution of the left coronal radiata lacunar infarct which was subacute on the most recent comparison. Chronic lacunar infarcts in the bilateral deep gray matter nuclei and subcortical white matter. New focus of involvement in the left frontal lobe on series 2, image 26, but appears chronic. Small left inferior cerebellar lacunar infarcts also are new since 2010.  No acute intracranial hemorrhage identified. No midline shift, mass effect, or evidence of intracranial mass lesion. No ventriculomegaly. No evidence of cortically based acute infarction identified. No suspicious  intracranial vascular hyperdensity.  IMPRESSION: Chronic small vessel ischemia, with progression of disease since 2010. No acute cortically based infarct or acute intracranial hemorrhage.  Study discussed by telephone with Dr. Amada Jupiter On 12/01/2012 at 19:08 .   Electronically Signed   By: Augusto Gamble M.D.   On: 12/01/2012 19:09   Mr Brain Wo Contrast  12/09/2012   CLINICAL DATA:  Recent onset of bilateral infarcts. Acute onset of left hand decreased sensation and weakness. The symptoms have since resolved.  EXAM: MRI HEAD WITHOUT CONTRAST  TECHNIQUE: Multiplanar, multiecho pulse sequences of the brain and surrounding structures were obtained without intravenous contrast.  COMPARISON:  CT head without contrast from the same day. MRI head without contrast 12/03/2012.  FINDINGS: The previously seen high right  frontal lobe infarct is again seen. There is a new linear area of restricted diffusion involving the right frontal lobe along the primary sensory cortex measuring 12 mm. A 2nd area of new cortical infarction is noted along the right frontal operculum, measuring 11 mm.  T2 changes are present with the subacute infarcts.  Remote lacunar infarcts are present within the basal ganglia bilaterally. Periventricular and subcortical white matter changes are otherwise stable bilaterally.  Flow is present in the major intracranial arteries. The globes and orbits are intact. The in the right maxillary sinuses chronically opacified. Circumferential mucosal thickening is again noted in the left maxillary sinus. Diffuse ethmoid disease has slightly progressed. The mastoid air cells are clear.  IMPRESSION: 1. 2 new areas of cortical infarction involving the right frontal operculum and the right parietal lobe. 2. The areas of previously seen acute infarction demonstrate expected change. 3. No evidence for hemorrhage or mass. 4. Remote lacunar infarcts of the basal ganglia bilaterally. 5. Diffuse sinus disease.    Electronically Signed   By: Gennette Pac M.D.   On: 12/09/2012 18:15   Mr Brain Wo Contrast  12/03/2012   CLINICAL DATA:  Progressive right-sided weakness  EXAM: MRI HEAD WITHOUT CONTRAST  MRA HEAD WITHOUT CONTRAST  TECHNIQUE: Multiplanar, multiecho pulse sequences of the brain and surrounding structures were obtained without intravenous contrast. Angiographic images of the head were obtained using MRA technique without contrast.  COMPARISON:  Prior CT from 12/01/2012.  FINDINGS: MRI HEAD FINDINGS  Scattered foci of T2/FLAIR hyperintensity seen within the periventricular and deep white matter are present, most compatible with chronic small vessel ischemic changes. There is mild atrophy. There is no mass lesion or midline shift. The CSF containing spaces are within normal limits. No hydrocephalus. No extra-axial fluid collection.  Several scattered foci of restricted diffusion are seen involving the cortex and subcortical white matter of the right frontal lobe (series 5, images 18, 19, 21, 22). These are in the region of the precentral gyrus. The largest of these foci measures 1.5 x 0.7 cm and is somewhat linear and morphology. There is corresponding signal loss on the ADC map. No acute intracranial hemorrhage is seen on the gradient echo sequence.  Remote left cerebellar infarct is noted (Series 9, image 20). Small remote lacunar infarcts are also present within the basal ganglia and periventricular white matter bilaterally.  The craniocervical junction is normal. Corpus callosum is within normal limits. Pituitary gland demonstrates a normal appearance. The pituitary stalk is midline.  There is near-complete opacification of the right maxillary sinus with partial opacification of the left maxillary sinus. Scattered fluid density is seen within the ethmoidal air cells bilaterally. There is no mastoid effusion.  MRA HEAD FINDINGS  3D time-of-flight imaging of the intracranial circulation demonstrates a short  segment stenosis of approximately 50% by NASCET criteria within the cavernous segment of the right internal carotid artery (series 3, image 61). Additional multi focal irregularity is seen throughout the cavernous portion of the right ICA, likely related to underlying atherosclerotic disease. There is additional asymmetric focal narrowing of the right internal carotid artery involving its supra clinoid segment of approximately 70% (series 3, image 76). Distally, the right middle cerebral artery is widely patent with antegrade flow. The right middle cerebral artery branches are opacified. The left internal carotid artery is mildly irregular without high-grade stenosis. A1 segments are regular but widely patent and symmetric in appearance. Anterior communicating artery is within normal limits. Anterior cerebral arteries are symmetric in  caliber with antegrade flow. Short-segment stenosis of approximately 50% is seen within the M1 segment of the left middle cerebral artery (series 3, image 92). The left middle cerebral artery and its branches are widely patent distally.  The vertebral arteries are patent with antegrade flow. The left vertebral artery is diminutive as compared to the right. The origins of the posterior inferior cerebellar arteries are within normal limits. The vertebrobasilar junction, basilar artery, and basilar tip are within normal limits. Multi focal irregularity is seen within the posterior cerebral arteries, with slightly decreased flow seen within the distal branches of the right posterior cerebral artery. The superior cerebellar arteries are grossly normal. Posterior communicating arteries are not well visualized on this examination.  That no intracranial aneurysm identified.  IMPRESSION: MRI HEAD:  1. Scattered foci of restricted diffusion involving the cortex and subcortical white matter of the right frontal lobe, consistent with acute ischemic infarcts. 2. Atrophy with chronic small vessel  ischemic disease. 3. Remote left cerebellar and bilateral basal ganglia lacunar infarcts. MRA HEAD:  1. Multi focal stenoses involving the cavernous and supra clinoid segments of the right internal carotid artery as detailed above, measuring up to approximately 70% in the supra clinoid segment. Finding is likely related to underlying chronic atheromatous disease. 2. Additional multi focal irregularity within the remaining right internal carotid artery and posterior cerebral arteries, also likely related to underlying atherosclerotic disease. 3. Short segment stenosis of approximately 50% within the M1 segment of the left middle cerebral artery. The left MCA and its branches are widely patent distally. 4. No intracranial aneurysm identified.   Electronically Signed   By: Rise Mu M.D.   On: 12/03/2012 23:48   US Soft Tissue Head/neck  12/02/2012   CLINICAL DATA:  77-year- male with difficulty swallowing and palpable abnormality in the neck.  EXAM: THYROID ULTRASOUND  TECHNIQUE: Ultrasound examination of the thyroid gland and adjacent soft tissues was performed.  COMPARISON:  Head CT 12/01/2012.  FINDINGS: Right thyroid lobe  Measurements: 3.7 x 1.4 x 2.0 cm.  No nodules visualized.  Left thyroid lobe  Measurements: 3.6 x 1.6 x 1.8 cm.  No nodules visualized.  Isthmus  Thickness: 6 mm.  No nodules visualized.  Lymphadenopathy  None visualized.  Palpable area of concern was imaged an seems to correspond to the left submandibular gland. Contralateral images at the same level demonstrate the right submandibular gland with similar echotexture. The left gland may be mildly enlarged and with mild central ductal ectasia.  IMPRESSION: 1.  Normal thyroid.  2. Palpable area of concern seems to correspond to the left submandibular gland which appears mildly asymmetrically enlarged compared to the right SMG. Consider left sialoadenitis or obstructing silolithiasis.   Electronically Signed   By: Augusto Gamble M.D.   On:  12/02/2012 22:53   Dg Swallowing Func-speech Pathology  12/02/2012   Chales Abrahams, CCC-SLP     12/02/2012 10:01 AM Objective Swallowing Evaluation: Modified Barium Swallowing Study   Patient Details  Name: SEARS ORAN MRN: [id number] Date of Birth: 10/09/1935  Today's Date: 12/02/2012 Time: 0905-0925 SLP Time Calculation (min): 20 min  Past Medical History:  Past Medical History  Diagnosis Date  . Stroke     Right side   . Diabetes mellitus   . Arthritis   . Hypertension   . Hyperlipemia   . Diverticulosis   . Colon polyp    Past Surgical History:  Past Surgical History  Procedure Laterality Date  . Bilateral inguinal  hernia     HPI:  77 yo male adm to [facility] with slurred speech.  CT head negative for  acute event.  PMH + Belll's Palsy resulting in right facial  weakness (upper and lower), CVA Nov 2010 impairing speech and  with Rt HP.  Pt speaking better at home per family and was  starting to walk.       Assessment / Plan / Recommendation Clinical Impression  Dysphagia Diagnosis: Moderate oral phase dysphagia;Moderate  pharyngeal phase dysphagia  Clinical impression: Moderate oropharyngeal dysphagia  characterized by decreased oral bolus cohesion/control due to  hypoglossal and glossopharyngeal involvement resulting in  premature spillage into pharynx. Pharyngeal swallow was strong  albeit delayed to pyriform sinus and vallecular space resulting  in + audible aspiration of thin liquids - even with chin tuck  posture.  Unfortunately cough is weak and nonprotective and  aspiration appears to cause mild distress.   Chin tuck posture  effectively protects airway with pudding, honey and even nectar.   However given severity of weakness of cough and distress with  aspiration, recommend to start with honey thick liquids.  Skilled  intervention included education of pt, family to precautions and  reinforcement of strategies.    Pt noted to have secretions retained in airway that he was unable  to clear- therefore  recommend oral care after meals.    As pt has been coughing some with intake at home PTA, suspect  component of chronic aspiration - now with worsened significantly  by this event.      Treatment Recommendation  Therapy as outlined in treatment plan below    Diet Recommendation Honey-thick liquid;Dysphagia 1 (Puree)   Liquid Administration via: Cup Medication Administration: Crushed with puree Supervision: Patient able to self feed;Full supervision/cueing  for compensatory strategies Compensations: Slow rate;Small sips/bites;Check for pocketing Postural Changes and/or Swallow Maneuvers: Seated upright 90  degrees;Chin tuck    Other  Recommendations Recommended Consults: MBS Oral Care Recommendations:  (oral care after meals) Other Recommendations: Order thickener from pharmacy   Follow Up Recommendations  Inpatient Rehab    Frequency and Duration min 2x/week  2 weeks   Pertinent Vitals/Pain Afebrile, decreased    SLP Swallow Goals     General Date of Onset: 12/02/12 HPI: 77 yo male adm to [facility] with slurred speech.  CT head negative  for acute event.  PMH + Belll's Palsy resulting in right facial  weakness (upper and lower), CVA Nov 2010 impairing speech and  with Rt HP.  Pt speaking better at home per family and was  starting to walk.   Type of Study: Modified Barium Swallowing Study Previous Swallow Assessment: bedside eval in 2010 per family, no  info found in epic Diet Prior to this Study: NPO Temperature Spikes Noted: No Respiratory Status: Room air History of Recent Intubation: No Behavior/Cognition: Alert;Cooperative;Pleasant mood Oral Cavity - Dentition:  (four upper teeth, lower dentition  intact, pt has no dentures/partial) Oral Motor / Sensory Function: Impaired - see Bedside swallow  eval Self-Feeding Abilities: Able to feed self Patient Positioning: Upright in chair Baseline Vocal Quality: Breathy;Low vocal intensity Volitional Cough: Weak Volitional Swallow: Able to elicit Anatomy: Within functional  limits Pharyngeal Secretions: Standing secretions in (comment)  (secretions mix with barium, + aspiration of secretions during  MBS)    Reason for Referral   concern for aspiration after suspected  neuro event  Oral Phase Oral Preparation/Oral Phase Oral Phase: Impaired Oral - Honey Oral - Honey Teaspoon: Delayed  oral transit;Weak lingual  manipulation;Reduced posterior propulsion Oral - Nectar Oral - Nectar Teaspoon: Reduced posterior propulsion;Weak lingual  manipulation;Delayed oral transit Oral - Nectar Cup: Reduced posterior propulsion;Weak lingual  manipulation;Delayed oral transit Oral - Nectar Straw: Reduced posterior propulsion;Weak lingual  manipulation;Delayed oral transit Oral - Thin Oral - Thin Cup: Weak lingual manipulation;Reduced posterior  propulsion;Delayed oral transit Oral - Solids Oral - Puree: Reduced posterior propulsion;Weak lingual  manipulation;Delayed oral transit Oral - Regular: Reduced posterior propulsion;Weak lingual  manipulation;Delayed oral transit;Impaired mastication Oral Phase - Comment Oral Phase - Comment: chin tuck posture effective for airway  protection with nectar, honey, pudding and cracker - still with +  overt audible asp of thin with chin tuck   Pharyngeal Phase Pharyngeal Phase Pharyngeal Phase: Impaired Pharyngeal - Honey Pharyngeal - Honey Teaspoon: Delayed swallow initiation;Premature  spillage to pyriform sinuses;Premature spillage to valleculae Pharyngeal - Nectar Pharyngeal - Nectar Teaspoon: Delayed swallow  initiation;Premature spillage to valleculae;Premature spillage to  pyriform sinuses Pharyngeal - Nectar Cup: Delayed swallow initiation;Premature  spillage to pyriform sinuses;Premature spillage to  valleculae;Penetration/Aspiration during swallow Penetration/Aspiration details (nectar cup): Material enters  airway, remains ABOVE vocal cords then ejected out Pharyngeal - Nectar Straw: Delayed swallow initiation;Premature  spillage to valleculae;Premature  spillage to pyriform sinuses Pharyngeal - Thin Pharyngeal - Thin Cup: Significant aspiration  (Amount);Penetration/Aspiration during swallow;Delayed swallow  initiation;Premature spillage to valleculae;Premature spillage to  pyriform sinuses Penetration/Aspiration details (thin cup): Material enters  airway, passes BELOW cords and not ejected out despite cough  attempt by patient Pharyngeal - Solids Pharyngeal - Puree: Penetration/Aspiration during  swallow;Premature spillage to valleculae;Premature spillage to  pyriform sinuses Penetration/Aspiration details (puree): Material enters airway,  remains ABOVE vocal cords then ejected out Pharyngeal - Regular: Delayed swallow initiation;Premature  spillage to valleculae Pharyngeal Phase - Comment Pharyngeal Comment: chin tuck posture effective for airway  protection with nectar, honey, pudding and cracker - still with +  overt audible asp of thin with chin tuck  Cervical Esophageal Phase    GO    Cervical Esophageal Phase Cervical Esophageal Phase: WFL (esophagus appeared clear upon  sweep x2)         Donavan Burnet, MS [facility] SLP [phone]    Mr Maxine Glenn Head/brain Wo Cm  12/03/2012   CLINICAL DATA:  Progressive right-sided weakness  EXAM: MRI HEAD WITHOUT CONTRAST  MRA HEAD WITHOUT CONTRAST  TECHNIQUE: Multiplanar, multiecho pulse sequences of the brain and surrounding structures were obtained without intravenous contrast. Angiographic images of the head were obtained using MRA technique without contrast.  COMPARISON:  Prior CT from 12/01/2012.  FINDINGS: MRI HEAD FINDINGS  Scattered foci of T2/FLAIR hyperintensity seen within the periventricular and deep white matter are present, most compatible with chronic small vessel ischemic changes. There is mild atrophy. There is no mass lesion or midline shift. The CSF containing spaces are within normal limits. No hydrocephalus. No extra-axial fluid collection.  Several scattered foci of restricted diffusion are seen involving the  cortex and subcortical white matter of the right frontal lobe (series 5, images 18, 19, 21, 22). These are in the region of the precentral gyrus. The largest of these foci measures 1.5 x 0.7 cm and is somewhat linear and morphology. There is corresponding signal loss on the ADC map. No acute intracranial hemorrhage is seen on the gradient echo sequence.  Remote left cerebellar infarct is noted (Series 9, image 20). Small remote lacunar infarcts are also present within the basal ganglia and periventricular white matter bilaterally.  The craniocervical  junction is normal. Corpus callosum is within normal limits. Pituitary gland demonstrates a normal appearance. The pituitary stalk is midline.  There is near-complete opacification of the right maxillary sinus with partial opacification of the left maxillary sinus. Scattered fluid density is seen within the ethmoidal air cells bilaterally. There is no mastoid effusion.  MRA HEAD FINDINGS  3D time-of-flight imaging of the intracranial circulation demonstrates a short segment stenosis of approximately 50% by NASCET criteria within the cavernous segment of the right internal carotid artery (series 3, image 61). Additional multi focal irregularity is seen throughout the cavernous portion of the right ICA, likely related to underlying atherosclerotic disease. There is additional asymmetric focal narrowing of the right internal carotid artery involving its supra clinoid segment of approximately 70% (series 3, image 76). Distally, the right middle cerebral artery is widely patent with antegrade flow. The right middle cerebral artery branches are opacified. The left internal carotid artery is mildly irregular without high-grade stenosis. A1 segments are regular but widely patent and symmetric in appearance. Anterior communicating artery is within normal limits. Anterior cerebral arteries are symmetric in caliber with antegrade flow. Short-segment stenosis of approximately 50%  is seen within the M1 segment of the left middle cerebral artery (series 3, image 92). The left middle cerebral artery and its branches are widely patent distally.  The vertebral arteries are patent with antegrade flow. The left vertebral artery is diminutive as compared to the right. The origins of the posterior inferior cerebellar arteries are within normal limits. The vertebrobasilar junction, basilar artery, and basilar tip are within normal limits. Multi focal irregularity is seen within the posterior cerebral arteries, with slightly decreased flow seen within the distal branches of the right posterior cerebral artery. The superior cerebellar arteries are grossly normal. Posterior communicating arteries are not well visualized on this examination.  That no intracranial aneurysm identified.  IMPRESSION: MRI HEAD:  1. Scattered foci of restricted diffusion involving the cortex and subcortical white matter of the right frontal lobe, consistent with acute ischemic infarcts. 2. Atrophy with chronic small vessel ischemic disease. 3. Remote left cerebellar and bilateral basal ganglia lacunar infarcts. MRA HEAD:  1. Multi focal stenoses involving the cavernous and supra clinoid segments of the right internal carotid artery as detailed above, measuring up to approximately 70% in the supra clinoid segment. Finding is likely related to underlying chronic atheromatous disease. 2. Additional multi focal irregularity within the remaining right internal carotid artery and posterior cerebral arteries, also likely related to underlying atherosclerotic disease. 3. Short segment stenosis of approximately 50% within the M1 segment of the left middle cerebral artery. The left MCA and its branches are widely patent distally. 4. No intracranial aneurysm identified.   Electronically Signed   By: Rise Mu M.D.   On: 12/03/2012 23:48    Microbiology: No results found for this or any previous visit (from the past 240  hour(s)).   Labs: Basic Metabolic Panel:  Recent Labs Lab 12/09/12 1436 12/09/12 1531 12/10/12 0900 12/11/12 0603  NA 136 139 138 137  K 5.6* 5.5* 4.4 4.3  CL 101 103 102 104  CO2 27  --  25 24  GLUCOSE 106* 106* 109* 133*  BUN 26* 28* 20 18  CREATININE 1.67* 1.90* 1.38* 1.23  CALCIUM 9.4  --  9.6 8.7   Liver Function Tests:  Recent Labs Lab 12/09/12 1436  AST 25  ALT 28  ALKPHOS 72  BILITOT 0.4  PROT 7.2  ALBUMIN 3.8   No  results found for this basename: LIPASE, AMYLASE,  in the last 168 hours No results found for this basename: AMMONIA,  in the last 168 hours CBC:  Recent Labs Lab 12/09/12 1436 12/09/12 1531  WBC 10.3  --   NEUTROABS 6.1  --   HGB 12.5* 14.3  HCT 37.8* 42.0  MCV 93.3  --   PLT 196  --    Cardiac Enzymes:  Recent Labs Lab 12/09/12 1436  TROPONINI <0.30   BNP: BNP (last 3 results) No results found for this basename: PROBNP,  in the last 8760 hours CBG:  Recent Labs Lab 12/10/12 1135 12/10/12 1645 12/10/12 2103 12/10/12 2259 12/11/12 0657  GLUCAP 139* 233* 70 158* 125*       Signed:  Carolyne Littles, MD [facility] [phone] pager

## 2012-12-15 DIAGNOSIS — Z5189 Encounter for other specified aftercare: Secondary | ICD-10-CM | POA: Diagnosis not present

## 2012-12-15 DIAGNOSIS — E119 Type 2 diabetes mellitus without complications: Secondary | ICD-10-CM | POA: Diagnosis not present

## 2012-12-15 DIAGNOSIS — M159 Polyosteoarthritis, unspecified: Secondary | ICD-10-CM | POA: Diagnosis not present

## 2012-12-15 DIAGNOSIS — I69991 Dysphagia following unspecified cerebrovascular disease: Secondary | ICD-10-CM | POA: Diagnosis not present

## 2012-12-15 DIAGNOSIS — I69920 Aphasia following unspecified cerebrovascular disease: Secondary | ICD-10-CM | POA: Diagnosis not present

## 2012-12-15 DIAGNOSIS — I10 Essential (primary) hypertension: Secondary | ICD-10-CM | POA: Diagnosis not present

## 2012-12-16 DIAGNOSIS — I10 Essential (primary) hypertension: Secondary | ICD-10-CM | POA: Diagnosis not present

## 2012-12-16 DIAGNOSIS — I69991 Dysphagia following unspecified cerebrovascular disease: Secondary | ICD-10-CM | POA: Diagnosis not present

## 2012-12-16 DIAGNOSIS — E119 Type 2 diabetes mellitus without complications: Secondary | ICD-10-CM | POA: Diagnosis not present

## 2012-12-16 DIAGNOSIS — Z5189 Encounter for other specified aftercare: Secondary | ICD-10-CM | POA: Diagnosis not present

## 2012-12-16 DIAGNOSIS — I69920 Aphasia following unspecified cerebrovascular disease: Secondary | ICD-10-CM | POA: Diagnosis not present

## 2012-12-16 DIAGNOSIS — M159 Polyosteoarthritis, unspecified: Secondary | ICD-10-CM | POA: Diagnosis not present

## 2012-12-17 ENCOUNTER — Other Ambulatory Visit (HOSPITAL_COMMUNITY): Payer: Self-pay | Admitting: Internal Medicine

## 2012-12-17 DIAGNOSIS — I69991 Dysphagia following unspecified cerebrovascular disease: Secondary | ICD-10-CM | POA: Diagnosis not present

## 2012-12-17 DIAGNOSIS — I69920 Aphasia following unspecified cerebrovascular disease: Secondary | ICD-10-CM | POA: Diagnosis not present

## 2012-12-17 DIAGNOSIS — Z5189 Encounter for other specified aftercare: Secondary | ICD-10-CM | POA: Diagnosis not present

## 2012-12-17 DIAGNOSIS — M159 Polyosteoarthritis, unspecified: Secondary | ICD-10-CM | POA: Diagnosis not present

## 2012-12-17 DIAGNOSIS — R1310 Dysphagia, unspecified: Secondary | ICD-10-CM

## 2012-12-17 DIAGNOSIS — E119 Type 2 diabetes mellitus without complications: Secondary | ICD-10-CM | POA: Diagnosis not present

## 2012-12-17 DIAGNOSIS — I10 Essential (primary) hypertension: Secondary | ICD-10-CM | POA: Diagnosis not present

## 2012-12-18 DIAGNOSIS — I69991 Dysphagia following unspecified cerebrovascular disease: Secondary | ICD-10-CM | POA: Diagnosis not present

## 2012-12-18 DIAGNOSIS — I10 Essential (primary) hypertension: Secondary | ICD-10-CM | POA: Diagnosis not present

## 2012-12-18 DIAGNOSIS — M159 Polyosteoarthritis, unspecified: Secondary | ICD-10-CM | POA: Diagnosis not present

## 2012-12-18 DIAGNOSIS — E119 Type 2 diabetes mellitus without complications: Secondary | ICD-10-CM | POA: Diagnosis not present

## 2012-12-18 DIAGNOSIS — Z5189 Encounter for other specified aftercare: Secondary | ICD-10-CM | POA: Diagnosis not present

## 2012-12-18 DIAGNOSIS — I69920 Aphasia following unspecified cerebrovascular disease: Secondary | ICD-10-CM | POA: Diagnosis not present

## 2012-12-21 DIAGNOSIS — I10 Essential (primary) hypertension: Secondary | ICD-10-CM | POA: Diagnosis not present

## 2012-12-21 DIAGNOSIS — Z5189 Encounter for other specified aftercare: Secondary | ICD-10-CM | POA: Diagnosis not present

## 2012-12-21 DIAGNOSIS — I69991 Dysphagia following unspecified cerebrovascular disease: Secondary | ICD-10-CM | POA: Diagnosis not present

## 2012-12-21 DIAGNOSIS — I69920 Aphasia following unspecified cerebrovascular disease: Secondary | ICD-10-CM | POA: Diagnosis not present

## 2012-12-21 DIAGNOSIS — M159 Polyosteoarthritis, unspecified: Secondary | ICD-10-CM | POA: Diagnosis not present

## 2012-12-21 DIAGNOSIS — E119 Type 2 diabetes mellitus without complications: Secondary | ICD-10-CM | POA: Diagnosis not present

## 2012-12-22 DIAGNOSIS — I69991 Dysphagia following unspecified cerebrovascular disease: Secondary | ICD-10-CM | POA: Diagnosis not present

## 2012-12-22 DIAGNOSIS — E119 Type 2 diabetes mellitus without complications: Secondary | ICD-10-CM | POA: Diagnosis not present

## 2012-12-22 DIAGNOSIS — M159 Polyosteoarthritis, unspecified: Secondary | ICD-10-CM | POA: Diagnosis not present

## 2012-12-22 DIAGNOSIS — Z5189 Encounter for other specified aftercare: Secondary | ICD-10-CM | POA: Diagnosis not present

## 2012-12-22 DIAGNOSIS — I69920 Aphasia following unspecified cerebrovascular disease: Secondary | ICD-10-CM | POA: Diagnosis not present

## 2012-12-22 DIAGNOSIS — I10 Essential (primary) hypertension: Secondary | ICD-10-CM | POA: Diagnosis not present

## 2012-12-23 DIAGNOSIS — Z5189 Encounter for other specified aftercare: Secondary | ICD-10-CM | POA: Diagnosis not present

## 2012-12-23 DIAGNOSIS — M159 Polyosteoarthritis, unspecified: Secondary | ICD-10-CM | POA: Diagnosis not present

## 2012-12-23 DIAGNOSIS — I69920 Aphasia following unspecified cerebrovascular disease: Secondary | ICD-10-CM | POA: Diagnosis not present

## 2012-12-23 DIAGNOSIS — I10 Essential (primary) hypertension: Secondary | ICD-10-CM | POA: Diagnosis not present

## 2012-12-23 DIAGNOSIS — I69991 Dysphagia following unspecified cerebrovascular disease: Secondary | ICD-10-CM | POA: Diagnosis not present

## 2012-12-23 DIAGNOSIS — E119 Type 2 diabetes mellitus without complications: Secondary | ICD-10-CM | POA: Diagnosis not present

## 2012-12-28 DIAGNOSIS — I69920 Aphasia following unspecified cerebrovascular disease: Secondary | ICD-10-CM | POA: Diagnosis not present

## 2012-12-28 DIAGNOSIS — E119 Type 2 diabetes mellitus without complications: Secondary | ICD-10-CM | POA: Diagnosis not present

## 2012-12-28 DIAGNOSIS — I69991 Dysphagia following unspecified cerebrovascular disease: Secondary | ICD-10-CM | POA: Diagnosis not present

## 2012-12-28 DIAGNOSIS — Z5189 Encounter for other specified aftercare: Secondary | ICD-10-CM | POA: Diagnosis not present

## 2012-12-28 DIAGNOSIS — M159 Polyosteoarthritis, unspecified: Secondary | ICD-10-CM | POA: Diagnosis not present

## 2012-12-28 DIAGNOSIS — I10 Essential (primary) hypertension: Secondary | ICD-10-CM | POA: Diagnosis not present

## 2012-12-30 DIAGNOSIS — Z5189 Encounter for other specified aftercare: Secondary | ICD-10-CM | POA: Diagnosis not present

## 2012-12-30 DIAGNOSIS — I69920 Aphasia following unspecified cerebrovascular disease: Secondary | ICD-10-CM | POA: Diagnosis not present

## 2012-12-30 DIAGNOSIS — E119 Type 2 diabetes mellitus without complications: Secondary | ICD-10-CM | POA: Diagnosis not present

## 2012-12-30 DIAGNOSIS — I69991 Dysphagia following unspecified cerebrovascular disease: Secondary | ICD-10-CM | POA: Diagnosis not present

## 2012-12-30 DIAGNOSIS — I10 Essential (primary) hypertension: Secondary | ICD-10-CM | POA: Diagnosis not present

## 2012-12-30 DIAGNOSIS — M159 Polyosteoarthritis, unspecified: Secondary | ICD-10-CM | POA: Diagnosis not present

## 2013-01-01 DIAGNOSIS — I69920 Aphasia following unspecified cerebrovascular disease: Secondary | ICD-10-CM | POA: Diagnosis not present

## 2013-01-01 DIAGNOSIS — M159 Polyosteoarthritis, unspecified: Secondary | ICD-10-CM | POA: Diagnosis not present

## 2013-01-01 DIAGNOSIS — I69991 Dysphagia following unspecified cerebrovascular disease: Secondary | ICD-10-CM | POA: Diagnosis not present

## 2013-01-01 DIAGNOSIS — I10 Essential (primary) hypertension: Secondary | ICD-10-CM | POA: Diagnosis not present

## 2013-01-01 DIAGNOSIS — Z5189 Encounter for other specified aftercare: Secondary | ICD-10-CM | POA: Diagnosis not present

## 2013-01-01 DIAGNOSIS — E119 Type 2 diabetes mellitus without complications: Secondary | ICD-10-CM | POA: Diagnosis not present

## 2013-01-04 ENCOUNTER — Ambulatory Visit (HOSPITAL_COMMUNITY)
Admission: RE | Admit: 2013-01-04 | Discharge: 2013-01-04 | Disposition: A | Discharge status: Home / Self Care | Payer: Medicare Other | Source: Ambulatory Visit | Attending: Internal Medicine | Admitting: Internal Medicine

## 2013-01-04 DIAGNOSIS — R1312 Dysphagia, oropharyngeal phase: Secondary | ICD-10-CM | POA: Insufficient documentation

## 2013-01-04 DIAGNOSIS — R1310 Dysphagia, unspecified: Secondary | ICD-10-CM | POA: Insufficient documentation

## 2013-01-04 NOTE — Procedures (Signed)
Objective Swallowing Evaluation: Modified Barium Swallowing Study  Patient Details  Name: Casey Collins MRN: [id number] Date of Birth: [birth year]  Today's Date: 01/04/2013 Time: 1115-1138 SLP Time Calculation (min): 23 min  Past Medical History:  Past Medical History  Diagnosis Date  . Stroke     Right side   . Diabetes mellitus   . Arthritis   . Hypertension   . Hyperlipemia   . Diverticulosis   . Colon polyp    Past Surgical History:  Past Surgical History  Procedure Laterality Date  . Bilateral inguinal hernia     HPI:  Pt. is a 77 yr old seen for outpatient MBS accompanied by his son-in-law.  History includes CVA 12/01/12 resulting in oropharyngeal dysphagia.  Most recent MBS recommended Dys 3 diet and nectar thick liquids.  Additional PMH:  DM, HTN.  Purpose for MBS today is possible upgrade to thin liquids.       Assessment / Plan / Recommendation Clinical Impression  Dysphagia Diagnosis: Mild pharyngeal phase dysphagia;Mild oral phase dysphagia Clinical impression: Pt. exhibited mild oral dysphagia with intermittently decreased cohesion and control with thin.  Laryngeal penetration present (2 times) with larger sips and incomplete lowering of chin during tuck.  Mild verbal and visual cues were given to take small sips, tuck chin, hold thin in anterior oral cavity (several seconds) prior to transiting which allowed time for increased airway protection.  Swallow initiation was functional and pharyngeal residue not present.  Recommending regular texture diet (using caution with dry, crumbly foods) and small cup sips thin liquid with continuation of chin tuck and brief pause prior to transit which allows increased oral control and containment.  No straws, pills whole in applesauce.       Treatment Recommendation  Defer treatment plan to SLP at (Comment)    Diet Recommendation Thin liquid;Regular   Liquid Administration via: Cup;No straw Medication Administration: Whole  meds with puree Supervision: Full supervision/cueing for compensatory strategies;Patient able to self feed Compensations: Slow rate;Small sips/bites Postural Changes and/or Swallow Maneuvers: Chin tuck;Seated upright 90 degrees;Upright 30-60 min after meal (chin tuck liquids)    Other  Recommendations Oral Care Recommendations: Oral care BID   Follow Up Recommendations  Home health SLP    Frequency and Duration        Pertinent Vitals/Pain WDL            Reason for Referral Objectively evaluate swallowing function   Oral Phase Oral Preparation/Oral Phase Oral Phase: WFL   Pharyngeal Phase Pharyngeal Phase Pharyngeal Phase: Impaired Pharyngeal - Pudding Pharyngeal - Pudding Teaspoon: Not tested Pharyngeal - Pudding Cup: Not tested Pharyngeal - Honey Pharyngeal - Honey Teaspoon: Within functional limits Pharyngeal - Honey Cup: Not tested Pharyngeal - Honey Syringe: Not tested Pharyngeal - Nectar Pharyngeal - Nectar Teaspoon: Within functional limits Pharyngeal - Nectar Cup: Within functional limits Pharyngeal - Nectar Straw: Not tested Pharyngeal - Nectar Syringe: Not tested Pharyngeal - Thin Pharyngeal - Ice Chips: Not tested Pharyngeal - Thin Teaspoon: Penetration/Aspiration during swallow;Reduced airway/laryngeal closure;Reduced laryngeal elevation Penetration/Aspiration details (thin teaspoon): Material enters airway, remains ABOVE vocal cords then ejected out Pharyngeal - Thin Cup: Penetration/Aspiration during swallow;Reduced airway/laryngeal closure;Reduced laryngeal elevation Penetration/Aspiration details (thin cup): Material enters airway, remains ABOVE vocal cords and not ejected out;Material enters airway, remains ABOVE vocal cords then ejected out Pharyngeal - Thin Straw: Not tested Pharyngeal - Thin Syringe: Not tested Pharyngeal - Solids Pharyngeal - Puree: Within functional limits Pharyngeal - Mechanical Soft: Not tested Pharyngeal -  Regular: Within  functional limits Pharyngeal - Multi-consistency: Not tested Pharyngeal - Pill: Not tested  Cervical Esophageal Phase    GO    Cervical Esophageal Phase Cervical Esophageal Phase: [facility]    Functional Assessment Tool Used: clinical judgement Functional Limitations: Swallowing Swallow Current Status ([id number]): At least 40 percent but less than 60 percent impaired, limited or restricted Swallow Goal Status [phone]): At least 40 percent but less than 60 percent impaired, limited or restricted Swallow Discharge Status [phone]): At least 40 percent but less than 60 percent impaired, limited or restricted    Royce Macadamia M.Ed [employer] [phone]  01/04/2013

## 2013-01-05 DIAGNOSIS — I10 Essential (primary) hypertension: Secondary | ICD-10-CM | POA: Diagnosis not present

## 2013-01-05 DIAGNOSIS — Z5189 Encounter for other specified aftercare: Secondary | ICD-10-CM | POA: Diagnosis not present

## 2013-01-05 DIAGNOSIS — I69991 Dysphagia following unspecified cerebrovascular disease: Secondary | ICD-10-CM | POA: Diagnosis not present

## 2013-01-05 DIAGNOSIS — M159 Polyosteoarthritis, unspecified: Secondary | ICD-10-CM | POA: Diagnosis not present

## 2013-01-05 DIAGNOSIS — E119 Type 2 diabetes mellitus without complications: Secondary | ICD-10-CM | POA: Diagnosis not present

## 2013-01-05 DIAGNOSIS — I69920 Aphasia following unspecified cerebrovascular disease: Secondary | ICD-10-CM | POA: Diagnosis not present

## 2013-01-07 DIAGNOSIS — I69991 Dysphagia following unspecified cerebrovascular disease: Secondary | ICD-10-CM | POA: Diagnosis not present

## 2013-01-07 DIAGNOSIS — I69920 Aphasia following unspecified cerebrovascular disease: Secondary | ICD-10-CM | POA: Diagnosis not present

## 2013-01-07 DIAGNOSIS — E119 Type 2 diabetes mellitus without complications: Secondary | ICD-10-CM | POA: Diagnosis not present

## 2013-01-07 DIAGNOSIS — M159 Polyosteoarthritis, unspecified: Secondary | ICD-10-CM | POA: Diagnosis not present

## 2013-01-07 DIAGNOSIS — I10 Essential (primary) hypertension: Secondary | ICD-10-CM | POA: Diagnosis not present

## 2013-01-07 DIAGNOSIS — Z5189 Encounter for other specified aftercare: Secondary | ICD-10-CM | POA: Diagnosis not present

## 2013-01-09 DIAGNOSIS — I10 Essential (primary) hypertension: Secondary | ICD-10-CM | POA: Diagnosis not present

## 2013-01-09 DIAGNOSIS — E1159 Type 2 diabetes mellitus with other circulatory complications: Secondary | ICD-10-CM | POA: Diagnosis not present

## 2013-01-09 DIAGNOSIS — I6350 Cerebral infarction due to unspecified occlusion or stenosis of unspecified cerebral artery: Secondary | ICD-10-CM | POA: Diagnosis not present

## 2013-01-09 DIAGNOSIS — F329 Major depressive disorder, single episode, unspecified: Secondary | ICD-10-CM | POA: Diagnosis not present

## 2013-01-09 DIAGNOSIS — Z6828 Body mass index (BMI) 28.0-28.9, adult: Secondary | ICD-10-CM | POA: Diagnosis not present

## 2013-01-09 DIAGNOSIS — R1313 Dysphagia, pharyngeal phase: Secondary | ICD-10-CM | POA: Diagnosis not present

## 2013-01-11 DIAGNOSIS — Z5189 Encounter for other specified aftercare: Secondary | ICD-10-CM | POA: Diagnosis not present

## 2013-01-11 DIAGNOSIS — E119 Type 2 diabetes mellitus without complications: Secondary | ICD-10-CM | POA: Diagnosis not present

## 2013-01-11 DIAGNOSIS — I69991 Dysphagia following unspecified cerebrovascular disease: Secondary | ICD-10-CM | POA: Diagnosis not present

## 2013-01-11 DIAGNOSIS — I10 Essential (primary) hypertension: Secondary | ICD-10-CM | POA: Diagnosis not present

## 2013-01-11 DIAGNOSIS — I69920 Aphasia following unspecified cerebrovascular disease: Secondary | ICD-10-CM | POA: Diagnosis not present

## 2013-01-11 DIAGNOSIS — M159 Polyosteoarthritis, unspecified: Secondary | ICD-10-CM | POA: Diagnosis not present

## 2013-01-14 DIAGNOSIS — Z5189 Encounter for other specified aftercare: Secondary | ICD-10-CM | POA: Diagnosis not present

## 2013-01-14 DIAGNOSIS — I69991 Dysphagia following unspecified cerebrovascular disease: Secondary | ICD-10-CM | POA: Diagnosis not present

## 2013-01-14 DIAGNOSIS — E119 Type 2 diabetes mellitus without complications: Secondary | ICD-10-CM | POA: Diagnosis not present

## 2013-01-14 DIAGNOSIS — I10 Essential (primary) hypertension: Secondary | ICD-10-CM | POA: Diagnosis not present

## 2013-01-14 DIAGNOSIS — I69920 Aphasia following unspecified cerebrovascular disease: Secondary | ICD-10-CM | POA: Diagnosis not present

## 2013-01-14 DIAGNOSIS — M159 Polyosteoarthritis, unspecified: Secondary | ICD-10-CM | POA: Diagnosis not present

## 2013-02-25 DIAGNOSIS — I10 Essential (primary) hypertension: Secondary | ICD-10-CM | POA: Diagnosis not present

## 2013-02-25 DIAGNOSIS — E1159 Type 2 diabetes mellitus with other circulatory complications: Secondary | ICD-10-CM | POA: Diagnosis not present

## 2013-02-25 DIAGNOSIS — E1129 Type 2 diabetes mellitus with other diabetic kidney complication: Secondary | ICD-10-CM | POA: Diagnosis not present

## 2013-02-25 DIAGNOSIS — N182 Chronic kidney disease, stage 2 (mild): Secondary | ICD-10-CM | POA: Diagnosis not present

## 2013-02-25 DIAGNOSIS — F329 Major depressive disorder, single episode, unspecified: Secondary | ICD-10-CM | POA: Diagnosis not present

## 2013-02-25 DIAGNOSIS — I69959 Hemiplegia and hemiparesis following unspecified cerebrovascular disease affecting unspecified side: Secondary | ICD-10-CM | POA: Diagnosis not present

## 2013-02-25 DIAGNOSIS — Z6829 Body mass index (BMI) 29.0-29.9, adult: Secondary | ICD-10-CM | POA: Diagnosis not present

## 2013-02-25 DIAGNOSIS — F3289 Other specified depressive episodes: Secondary | ICD-10-CM | POA: Diagnosis not present

## 2013-06-02 DIAGNOSIS — Z6829 Body mass index (BMI) 29.0-29.9, adult: Secondary | ICD-10-CM | POA: Diagnosis not present

## 2013-06-02 DIAGNOSIS — I10 Essential (primary) hypertension: Secondary | ICD-10-CM | POA: Diagnosis not present

## 2013-06-02 DIAGNOSIS — R05 Cough: Secondary | ICD-10-CM | POA: Diagnosis not present

## 2013-06-02 DIAGNOSIS — R059 Cough, unspecified: Secondary | ICD-10-CM | POA: Diagnosis not present

## 2013-06-02 DIAGNOSIS — E1159 Type 2 diabetes mellitus with other circulatory complications: Secondary | ICD-10-CM | POA: Diagnosis not present

## 2013-06-02 DIAGNOSIS — E1129 Type 2 diabetes mellitus with other diabetic kidney complication: Secondary | ICD-10-CM | POA: Diagnosis not present

## 2013-08-19 DIAGNOSIS — E1159 Type 2 diabetes mellitus with other circulatory complications: Secondary | ICD-10-CM | POA: Diagnosis not present

## 2013-08-19 DIAGNOSIS — I69959 Hemiplegia and hemiparesis following unspecified cerebrovascular disease affecting unspecified side: Secondary | ICD-10-CM | POA: Diagnosis not present

## 2013-08-19 DIAGNOSIS — F3289 Other specified depressive episodes: Secondary | ICD-10-CM | POA: Diagnosis not present

## 2013-08-19 DIAGNOSIS — N182 Chronic kidney disease, stage 2 (mild): Secondary | ICD-10-CM | POA: Diagnosis not present

## 2013-08-19 DIAGNOSIS — E1129 Type 2 diabetes mellitus with other diabetic kidney complication: Secondary | ICD-10-CM | POA: Diagnosis not present

## 2013-08-19 DIAGNOSIS — Z1331 Encounter for screening for depression: Secondary | ICD-10-CM | POA: Diagnosis not present

## 2013-08-19 DIAGNOSIS — Z6830 Body mass index (BMI) 30.0-30.9, adult: Secondary | ICD-10-CM | POA: Diagnosis not present

## 2013-08-19 DIAGNOSIS — F329 Major depressive disorder, single episode, unspecified: Secondary | ICD-10-CM | POA: Diagnosis not present

## 2013-11-10 DIAGNOSIS — H2513 Age-related nuclear cataract, bilateral: Secondary | ICD-10-CM | POA: Diagnosis not present

## 2013-11-10 DIAGNOSIS — E119 Type 2 diabetes mellitus without complications: Secondary | ICD-10-CM | POA: Diagnosis not present

## 2013-11-10 DIAGNOSIS — Z794 Long term (current) use of insulin: Secondary | ICD-10-CM | POA: Diagnosis not present

## 2013-11-10 DIAGNOSIS — I10 Essential (primary) hypertension: Secondary | ICD-10-CM | POA: Diagnosis not present

## 2013-12-16 DIAGNOSIS — E119 Type 2 diabetes mellitus without complications: Secondary | ICD-10-CM | POA: Diagnosis not present

## 2013-12-16 DIAGNOSIS — H2513 Age-related nuclear cataract, bilateral: Secondary | ICD-10-CM | POA: Diagnosis not present

## 2013-12-16 DIAGNOSIS — H25043 Posterior subcapsular polar age-related cataract, bilateral: Secondary | ICD-10-CM | POA: Diagnosis not present

## 2013-12-16 DIAGNOSIS — H25013 Cortical age-related cataract, bilateral: Secondary | ICD-10-CM | POA: Diagnosis not present

## 2013-12-16 DIAGNOSIS — H269 Unspecified cataract: Secondary | ICD-10-CM | POA: Diagnosis not present

## 2013-12-17 DIAGNOSIS — I69959 Hemiplegia and hemiparesis following unspecified cerebrovascular disease affecting unspecified side: Secondary | ICD-10-CM | POA: Diagnosis not present

## 2013-12-17 DIAGNOSIS — E1129 Type 2 diabetes mellitus with other diabetic kidney complication: Secondary | ICD-10-CM | POA: Diagnosis not present

## 2013-12-17 DIAGNOSIS — E1151 Type 2 diabetes mellitus with diabetic peripheral angiopathy without gangrene: Secondary | ICD-10-CM | POA: Diagnosis not present

## 2013-12-17 DIAGNOSIS — Z23 Encounter for immunization: Secondary | ICD-10-CM | POA: Diagnosis not present

## 2013-12-17 DIAGNOSIS — I10 Essential (primary) hypertension: Secondary | ICD-10-CM | POA: Diagnosis not present

## 2013-12-17 DIAGNOSIS — F329 Major depressive disorder, single episode, unspecified: Secondary | ICD-10-CM | POA: Diagnosis not present

## 2013-12-17 DIAGNOSIS — N182 Chronic kidney disease, stage 2 (mild): Secondary | ICD-10-CM | POA: Diagnosis not present

## 2013-12-17 DIAGNOSIS — Z6829 Body mass index (BMI) 29.0-29.9, adult: Secondary | ICD-10-CM | POA: Diagnosis not present

## 2013-12-28 DIAGNOSIS — Z961 Presence of intraocular lens: Secondary | ICD-10-CM | POA: Diagnosis not present

## 2013-12-28 DIAGNOSIS — H2512 Age-related nuclear cataract, left eye: Secondary | ICD-10-CM | POA: Diagnosis not present

## 2013-12-28 DIAGNOSIS — I10 Essential (primary) hypertension: Secondary | ICD-10-CM | POA: Diagnosis not present

## 2013-12-28 DIAGNOSIS — H25012 Cortical age-related cataract, left eye: Secondary | ICD-10-CM | POA: Diagnosis not present

## 2013-12-28 DIAGNOSIS — E119 Type 2 diabetes mellitus without complications: Secondary | ICD-10-CM | POA: Diagnosis not present

## 2013-12-28 DIAGNOSIS — H2511 Age-related nuclear cataract, right eye: Secondary | ICD-10-CM | POA: Diagnosis not present

## 2014-01-28 HISTORY — PX: CATARACT EXTRACTION W/ INTRAOCULAR LENS  IMPLANT, BILATERAL: SHX1307

## 2014-02-21 DIAGNOSIS — H25011 Cortical age-related cataract, right eye: Secondary | ICD-10-CM | POA: Diagnosis not present

## 2014-02-21 DIAGNOSIS — H25041 Posterior subcapsular polar age-related cataract, right eye: Secondary | ICD-10-CM | POA: Diagnosis not present

## 2014-02-21 DIAGNOSIS — H2511 Age-related nuclear cataract, right eye: Secondary | ICD-10-CM | POA: Diagnosis not present

## 2014-03-01 DIAGNOSIS — H2511 Age-related nuclear cataract, right eye: Secondary | ICD-10-CM | POA: Diagnosis not present

## 2014-06-30 ENCOUNTER — Encounter: Payer: Self-pay | Admitting: Internal Medicine

## 2014-07-25 ENCOUNTER — Other Ambulatory Visit: Payer: Self-pay

## 2014-07-25 DIAGNOSIS — I10 Essential (primary) hypertension: Secondary | ICD-10-CM | POA: Diagnosis not present

## 2014-07-25 DIAGNOSIS — E1151 Type 2 diabetes mellitus with diabetic peripheral angiopathy without gangrene: Secondary | ICD-10-CM | POA: Diagnosis not present

## 2014-07-25 DIAGNOSIS — Z6830 Body mass index (BMI) 30.0-30.9, adult: Secondary | ICD-10-CM | POA: Diagnosis not present

## 2014-07-25 DIAGNOSIS — N182 Chronic kidney disease, stage 2 (mild): Secondary | ICD-10-CM | POA: Diagnosis not present

## 2014-07-25 DIAGNOSIS — R410 Disorientation, unspecified: Secondary | ICD-10-CM | POA: Diagnosis not present

## 2014-07-25 DIAGNOSIS — E1129 Type 2 diabetes mellitus with other diabetic kidney complication: Secondary | ICD-10-CM | POA: Diagnosis not present

## 2014-08-03 DIAGNOSIS — R410 Disorientation, unspecified: Secondary | ICD-10-CM | POA: Diagnosis not present

## 2014-08-03 DIAGNOSIS — Z6829 Body mass index (BMI) 29.0-29.9, adult: Secondary | ICD-10-CM | POA: Diagnosis not present

## 2014-08-03 DIAGNOSIS — R8290 Unspecified abnormal findings in urine: Secondary | ICD-10-CM | POA: Diagnosis not present

## 2014-08-03 DIAGNOSIS — N390 Urinary tract infection, site not specified: Secondary | ICD-10-CM | POA: Diagnosis not present

## 2014-08-03 DIAGNOSIS — E1151 Type 2 diabetes mellitus with diabetic peripheral angiopathy without gangrene: Secondary | ICD-10-CM | POA: Diagnosis not present

## 2014-08-03 DIAGNOSIS — R32 Unspecified urinary incontinence: Secondary | ICD-10-CM | POA: Diagnosis not present

## 2014-08-03 DIAGNOSIS — I69959 Hemiplegia and hemiparesis following unspecified cerebrovascular disease affecting unspecified side: Secondary | ICD-10-CM | POA: Diagnosis not present

## 2014-08-04 DIAGNOSIS — I639 Cerebral infarction, unspecified: Secondary | ICD-10-CM | POA: Diagnosis not present

## 2014-08-04 DIAGNOSIS — R930 Abnormal findings on diagnostic imaging of skull and head, not elsewhere classified: Secondary | ICD-10-CM | POA: Diagnosis not present

## 2014-08-08 DIAGNOSIS — I69959 Hemiplegia and hemiparesis following unspecified cerebrovascular disease affecting unspecified side: Secondary | ICD-10-CM | POA: Diagnosis not present

## 2014-08-08 DIAGNOSIS — Z6830 Body mass index (BMI) 30.0-30.9, adult: Secondary | ICD-10-CM | POA: Diagnosis not present

## 2014-08-08 DIAGNOSIS — N390 Urinary tract infection, site not specified: Secondary | ICD-10-CM | POA: Diagnosis not present

## 2014-08-08 DIAGNOSIS — R8290 Unspecified abnormal findings in urine: Secondary | ICD-10-CM | POA: Diagnosis not present

## 2014-08-08 DIAGNOSIS — R410 Disorientation, unspecified: Secondary | ICD-10-CM | POA: Diagnosis not present

## 2014-08-08 DIAGNOSIS — E1151 Type 2 diabetes mellitus with diabetic peripheral angiopathy without gangrene: Secondary | ICD-10-CM | POA: Diagnosis not present

## 2014-08-08 DIAGNOSIS — R32 Unspecified urinary incontinence: Secondary | ICD-10-CM | POA: Diagnosis not present

## 2014-08-08 DIAGNOSIS — R300 Dysuria: Secondary | ICD-10-CM | POA: Diagnosis not present

## 2014-08-31 DIAGNOSIS — I69959 Hemiplegia and hemiparesis following unspecified cerebrovascular disease affecting unspecified side: Secondary | ICD-10-CM | POA: Diagnosis not present

## 2014-08-31 DIAGNOSIS — F329 Major depressive disorder, single episode, unspecified: Secondary | ICD-10-CM | POA: Diagnosis not present

## 2014-08-31 DIAGNOSIS — I10 Essential (primary) hypertension: Secondary | ICD-10-CM | POA: Diagnosis not present

## 2014-08-31 DIAGNOSIS — Z6829 Body mass index (BMI) 29.0-29.9, adult: Secondary | ICD-10-CM | POA: Diagnosis not present

## 2014-08-31 DIAGNOSIS — N182 Chronic kidney disease, stage 2 (mild): Secondary | ICD-10-CM | POA: Diagnosis not present

## 2014-08-31 DIAGNOSIS — R410 Disorientation, unspecified: Secondary | ICD-10-CM | POA: Diagnosis not present

## 2014-08-31 DIAGNOSIS — E1129 Type 2 diabetes mellitus with other diabetic kidney complication: Secondary | ICD-10-CM | POA: Diagnosis not present

## 2014-08-31 DIAGNOSIS — E1151 Type 2 diabetes mellitus with diabetic peripheral angiopathy without gangrene: Secondary | ICD-10-CM | POA: Diagnosis not present

## 2015-02-20 DIAGNOSIS — N390 Urinary tract infection, site not specified: Secondary | ICD-10-CM | POA: Diagnosis not present

## 2015-02-20 DIAGNOSIS — N182 Chronic kidney disease, stage 2 (mild): Secondary | ICD-10-CM | POA: Diagnosis not present

## 2015-02-20 DIAGNOSIS — Z125 Encounter for screening for malignant neoplasm of prostate: Secondary | ICD-10-CM | POA: Diagnosis not present

## 2015-02-20 DIAGNOSIS — Z6829 Body mass index (BMI) 29.0-29.9, adult: Secondary | ICD-10-CM | POA: Diagnosis not present

## 2015-02-20 DIAGNOSIS — Z0000 Encounter for general adult medical examination without abnormal findings: Secondary | ICD-10-CM | POA: Diagnosis not present

## 2015-02-20 DIAGNOSIS — I69959 Hemiplegia and hemiparesis following unspecified cerebrovascular disease affecting unspecified side: Secondary | ICD-10-CM | POA: Diagnosis not present

## 2015-02-20 DIAGNOSIS — I10 Essential (primary) hypertension: Secondary | ICD-10-CM | POA: Diagnosis not present

## 2015-02-20 DIAGNOSIS — E1151 Type 2 diabetes mellitus with diabetic peripheral angiopathy without gangrene: Secondary | ICD-10-CM | POA: Diagnosis not present

## 2015-02-20 DIAGNOSIS — R8290 Unspecified abnormal findings in urine: Secondary | ICD-10-CM | POA: Diagnosis not present

## 2015-02-20 DIAGNOSIS — R32 Unspecified urinary incontinence: Secondary | ICD-10-CM | POA: Diagnosis not present

## 2015-02-20 DIAGNOSIS — Z1389 Encounter for screening for other disorder: Secondary | ICD-10-CM | POA: Diagnosis not present

## 2015-02-20 DIAGNOSIS — R5383 Other fatigue: Secondary | ICD-10-CM | POA: Diagnosis not present

## 2015-02-20 DIAGNOSIS — Z23 Encounter for immunization: Secondary | ICD-10-CM | POA: Diagnosis not present

## 2015-06-05 ENCOUNTER — Emergency Department (HOSPITAL_COMMUNITY): Payer: Medicare Other

## 2015-06-05 ENCOUNTER — Inpatient Hospital Stay (HOSPITAL_COMMUNITY)
Admission: EM | Admit: 2015-06-05 | Discharge: 2015-06-08 | DRG: 343 | Disposition: A | Discharge status: Home Health | Payer: Medicare Other | Attending: General Surgery | Admitting: General Surgery

## 2015-06-05 ENCOUNTER — Encounter (HOSPITAL_COMMUNITY): Payer: Self-pay | Admitting: Emergency Medicine

## 2015-06-05 DIAGNOSIS — N183 Chronic kidney disease, stage 3 (moderate): Secondary | ICD-10-CM | POA: Diagnosis not present

## 2015-06-05 DIAGNOSIS — E1122 Type 2 diabetes mellitus with diabetic chronic kidney disease: Secondary | ICD-10-CM | POA: Diagnosis present

## 2015-06-05 DIAGNOSIS — Z8673 Personal history of transient ischemic attack (TIA), and cerebral infarction without residual deficits: Secondary | ICD-10-CM

## 2015-06-05 DIAGNOSIS — Z7982 Long term (current) use of aspirin: Secondary | ICD-10-CM

## 2015-06-05 DIAGNOSIS — R339 Retention of urine, unspecified: Secondary | ICD-10-CM | POA: Diagnosis not present

## 2015-06-05 DIAGNOSIS — E785 Hyperlipidemia, unspecified: Secondary | ICD-10-CM | POA: Diagnosis not present

## 2015-06-05 DIAGNOSIS — R109 Unspecified abdominal pain: Secondary | ICD-10-CM | POA: Diagnosis not present

## 2015-06-05 DIAGNOSIS — Z23 Encounter for immunization: Secondary | ICD-10-CM

## 2015-06-05 DIAGNOSIS — K353 Acute appendicitis with localized peritonitis, without perforation or gangrene: Secondary | ICD-10-CM

## 2015-06-05 DIAGNOSIS — Z794 Long term (current) use of insulin: Secondary | ICD-10-CM | POA: Diagnosis not present

## 2015-06-05 DIAGNOSIS — I129 Hypertensive chronic kidney disease with stage 1 through stage 4 chronic kidney disease, or unspecified chronic kidney disease: Secondary | ICD-10-CM | POA: Diagnosis not present

## 2015-06-05 DIAGNOSIS — K37 Unspecified appendicitis: Secondary | ICD-10-CM | POA: Diagnosis present

## 2015-06-05 DIAGNOSIS — I517 Cardiomegaly: Secondary | ICD-10-CM | POA: Diagnosis not present

## 2015-06-05 DIAGNOSIS — K3580 Unspecified acute appendicitis: Secondary | ICD-10-CM | POA: Diagnosis not present

## 2015-06-05 DIAGNOSIS — Z9181 History of falling: Secondary | ICD-10-CM | POA: Diagnosis not present

## 2015-06-05 HISTORY — DX: Other chronic pain: G89.29

## 2015-06-05 HISTORY — DX: Major depressive disorder, single episode, unspecified: F32.9

## 2015-06-05 HISTORY — DX: Pneumonia, unspecified organism: J18.9

## 2015-06-05 HISTORY — DX: Pain in right hip: M25.551

## 2015-06-05 HISTORY — DX: Depression, unspecified: F32.A

## 2015-06-05 HISTORY — DX: Type 2 diabetes mellitus without complications: E11.9

## 2015-06-05 LAB — CBC
HCT: 42.6 % (ref 39.0–52.0)
Hemoglobin: 13.9 g/dL (ref 13.0–17.0)
MCH: 30.5 pg (ref 26.0–34.0)
MCHC: 32.6 g/dL (ref 30.0–36.0)
MCV: 93.6 fL (ref 78.0–100.0)
Platelets: 195 10*3/uL (ref 150–400)
RBC: 4.55 MIL/uL (ref 4.22–5.81)
RDW: 13.3 % (ref 11.5–15.5)
WBC: 17.3 10*3/uL — AB (ref 4.0–10.5)

## 2015-06-05 LAB — COMPREHENSIVE METABOLIC PANEL
ALK PHOS: 65 U/L (ref 38–126)
ALT: 23 U/L (ref 17–63)
AST: 30 U/L (ref 15–41)
Albumin: 4 g/dL (ref 3.5–5.0)
Anion gap: 11 (ref 5–15)
BUN: 27 mg/dL — AB (ref 6–20)
CHLORIDE: 102 mmol/L (ref 101–111)
CO2: 23 mmol/L (ref 22–32)
CREATININE: 1.43 mg/dL — AB (ref 0.61–1.24)
Calcium: 9.2 mg/dL (ref 8.9–10.3)
GFR, EST AFRICAN AMERICAN: 53 mL/min — AB (ref >60)
GFR, EST NON AFRICAN AMERICAN: 45 mL/min — AB (ref >60)
Glucose, Bld: 131 mg/dL — ABNORMAL HIGH (ref 65–99)
Potassium: 4.2 mmol/L (ref 3.5–5.1)
Sodium: 136 mmol/L (ref 135–145)
Total Bilirubin: 0.7 mg/dL (ref 0.3–1.2)
Total Protein: 7 g/dL (ref 6.5–8.1)

## 2015-06-05 LAB — URINALYSIS, ROUTINE W REFLEX MICROSCOPIC
Bilirubin Urine: NEGATIVE
GLUCOSE, UA: NEGATIVE mg/dL
HGB URINE DIPSTICK: NEGATIVE
KETONES UR: NEGATIVE mg/dL
LEUKOCYTES UA: NEGATIVE
Nitrite: NEGATIVE
Protein, ur: NEGATIVE mg/dL
Specific Gravity, Urine: 1.02 (ref 1.005–1.030)
pH: 5 (ref 5.0–8.0)

## 2015-06-05 LAB — LIPASE, BLOOD: Lipase: 45 U/L (ref 11–51)

## 2015-06-05 MED ORDER — SODIUM CHLORIDE 0.9 % IV BOLUS (SEPSIS)
1000.0000 mL | Freq: Once | INTRAVENOUS | Status: AC
Start: 2015-06-05 — End: 2015-06-06
  Administered 2015-06-05: 1000 mL via INTRAVENOUS

## 2015-06-05 MED ORDER — IOPAMIDOL (ISOVUE-300) INJECTION 61%
INTRAVENOUS | Status: AC
  Administered 2015-06-05: 80 mL
  Filled 2015-06-05: qty 100

## 2015-06-05 MED ORDER — MORPHINE SULFATE (PF) 4 MG/ML IV SOLN
4.0000 mg | Freq: Once | INTRAVENOUS | Status: AC
  Administered 2015-06-05: 4 mg via INTRAVENOUS
  Filled 2015-06-05: qty 1

## 2015-06-05 MED ORDER — ACETAMINOPHEN 500 MG PO TABS
1000.0000 mg | ORAL_TABLET | Freq: Once | ORAL | Status: AC
  Administered 2015-06-06: 1000 mg via ORAL
  Filled 2015-06-05: qty 2

## 2015-06-05 NOTE — ED Notes (Signed)
Pt. reports mid abdominal pain onset noon today , denies emesis or diarrhea / no fever or chills.

## 2015-06-05 NOTE — ED Provider Notes (Signed)
CSN: [id number]     Arrival date & time 06/05/15  2042 History   First MD Initiated Contact with Patient 06/05/15 2144     Chief Complaint  Patient presents with  . Abdominal Pain     (Consider location/radiation/quality/duration/timing/severity/associated sxs/prior Treatment) The history is provided by the patient.  Casey Collins is a 80 y.o. male hx of stroke, DM, HTN, HL here with abdominal pain. Patient has right lower quadrant pain around noon today. He was noted to be hypertensive in triage had low-grade temperature at that time and then now radiated to the left lower quadrant. Denies any vomiting but has some nausea. Denies any urinary symptoms or dysuria. States that the pain comes in waves.     Past Medical History  Diagnosis Date  . Stroke [facility])     Right side   . Diabetes mellitus   . Arthritis   . Hypertension   . Hyperlipemia   . Diverticulosis   . Colon polyp    Past Surgical History  Procedure Laterality Date  . Bilateral inguinal hernia     Family History  Problem Relation Age of Onset  . Colon cancer Neg Hx   . Prostate cancer Brother   . Diabetes Daughter    Social History  Substance Use Topics  . Smoking status: Never Smoker   . Smokeless tobacco: Never Used  . Alcohol Use: No    Review of Systems  Gastrointestinal: Positive for abdominal pain.  All other systems reviewed and are negative.     Allergies  Review of patient's allergies indicates no known allergies.  Home Medications   Prior to Admission medications   Medication Sig Start Date End Date Taking? Authorizing Provider  aspirin 325 MG tablet Take 1 tablet (325 mg total) by mouth daily. 12/04/12  Yes Maryann Mikhail, DO  citalopram (CELEXA) 10 MG tablet Take 10 mg by mouth daily.  05/16/11  Yes Historical Provider, MD  insulin aspart (NOVOLOG) 100 UNIT/ML injection Inject 5 Units into the skin daily. Sliding scale   Yes Historical Provider, MD  insulin glargine (LANTUS) 100 UNIT/ML  injection Inject 10 Units into the skin 2 (two) times daily.    Yes Historical Provider, MD  Maltodextrin-Xanthan Gum (RESOURCE THICKENUP CLEAR) POWD Take 120 g by mouth as needed. 12/04/12  Yes Maryann Mikhail, DO  metoprolol tartrate (LOPRESSOR) 25 MG tablet Take 25 mg by mouth 2 (two) times daily.  04/20/11  Yes Historical Provider, MD  ramipril (ALTACE) 5 MG capsule Take 5 mg by mouth daily.  05/16/11  Yes Historical Provider, MD  simvastatin (ZOCOR) 40 MG tablet Take 40 mg by mouth at bedtime.  05/16/11  Yes Historical Provider, MD  zolpidem (AMBIEN) 10 MG tablet Take 10 mg by mouth at bedtime.  05/16/11  Yes Historical Provider, MD   BP 125/60 mmHg  Pulse 56  Temp(Src) 98.7 F (37.1 C) (Oral)  Resp 18  SpO2 95% Physical Exam  Constitutional: He is oriented to person, place, and time.  Chronically ill, uncomfortable   HENT:  Head: Normocephalic.  Mouth/Throat: Oropharynx is clear and moist.  Eyes: Conjunctivae are normal. Pupils are equal, round, and reactive to light.  Neck: Normal range of motion. Neck supple.  Cardiovascular: Normal rate, regular rhythm and normal heart sounds.   Pulmonary/Chest: Effort normal and breath sounds normal. No respiratory distress. He has no wheezes. He has no rales.  Abdominal: Soft. Bowel sounds are normal.  + RLQ and LLQ tenderness, no rebound  Musculoskeletal: Normal range of motion. He exhibits no edema or tenderness.  Neurological: He is alert and oriented to person, place, and time. No cranial nerve deficit. Coordination normal.  Skin: Skin is warm and dry.  Psychiatric: He has a normal mood and affect. His behavior is normal. Judgment and thought content normal.  Nursing note and vitals reviewed.   ED Course  Procedures (including critical care time) Labs Review Labs Reviewed  COMPREHENSIVE METABOLIC PANEL - Abnormal; Notable for the following:    Glucose, Bld 131 (*)    BUN 27 (*)    Creatinine, Ser 1.43 (*)    GFR calc non Af Amer 45  (*)    GFR calc Af Amer 53 (*)    All other components within normal limits  CBC - Abnormal; Notable for the following:    WBC 17.3 (*)    All other components within normal limits  URINALYSIS, ROUTINE W REFLEX MICROSCOPIC (NOT AT [facility]) - Abnormal; Notable for the following:    APPearance HAZY (*)    All other components within normal limits  LIPASE, BLOOD    Imaging Review Dg Chest 2 View  06/05/2015  CLINICAL DATA:  Chest pain, chills and leukocytosis for 1 day. EXAM: CHEST  2 VIEW COMPARISON:  12/09/2012 FINDINGS: There is unchanged moderate cardiomegaly and aortic tortuosity. There is stable chronic appearing blunting of the left lateral costophrenic angle. Mild hyperinflation is also unchanged. The lungs are otherwise clear. The pulmonary vasculature is normal. IMPRESSION: Hyperinflation, cardiomegaly, chronic costophrenic angle blunting. No acute findings are evident. Electronically Signed   By: Andreas Newport M.D.   On: 06/05/2015 22:43   I have personally reviewed and evaluated these images and lab results as part of my medical decision-making.   EKG Interpretation None      MDM   Final diagnoses:  None   Casey Collins is a 80 y.o. male here with abdominal pain. Was hypertensive on arrival but BP now 120s in the room. Hypertension likely from pain. No chest pain to suggest dissection. Consider appy vs colitis. Will get labs, CT ab/pel.   11:50 PM WBC 17. UA nl. CT pending. Had severe pain just now, given morphine. Signed out to Dr. Claudine Mouton. If CT nl and pain controlled can be discharged but anticipate admission.       Wandra Arthurs, MD 06/05/15 2351

## 2015-06-06 ENCOUNTER — Encounter (HOSPITAL_COMMUNITY): Payer: Self-pay | Admitting: General Practice

## 2015-06-06 ENCOUNTER — Observation Stay (HOSPITAL_COMMUNITY): Payer: Medicare Other | Admitting: Anesthesiology

## 2015-06-06 ENCOUNTER — Encounter (HOSPITAL_COMMUNITY): Admission: EM | Disposition: A | Discharge status: Home Health | Payer: Self-pay | Source: Home / Self Care

## 2015-06-06 DIAGNOSIS — I10 Essential (primary) hypertension: Secondary | ICD-10-CM | POA: Diagnosis not present

## 2015-06-06 DIAGNOSIS — R109 Unspecified abdominal pain: Secondary | ICD-10-CM | POA: Diagnosis not present

## 2015-06-06 DIAGNOSIS — K3580 Unspecified acute appendicitis: Secondary | ICD-10-CM | POA: Diagnosis present

## 2015-06-06 DIAGNOSIS — I129 Hypertensive chronic kidney disease with stage 1 through stage 4 chronic kidney disease, or unspecified chronic kidney disease: Secondary | ICD-10-CM | POA: Diagnosis present

## 2015-06-06 DIAGNOSIS — Z7982 Long term (current) use of aspirin: Secondary | ICD-10-CM | POA: Diagnosis not present

## 2015-06-06 DIAGNOSIS — M1990 Unspecified osteoarthritis, unspecified site: Secondary | ICD-10-CM | POA: Diagnosis not present

## 2015-06-06 DIAGNOSIS — R1031 Right lower quadrant pain: Secondary | ICD-10-CM | POA: Diagnosis not present

## 2015-06-06 DIAGNOSIS — E785 Hyperlipidemia, unspecified: Secondary | ICD-10-CM | POA: Diagnosis present

## 2015-06-06 DIAGNOSIS — E119 Type 2 diabetes mellitus without complications: Secondary | ICD-10-CM | POA: Diagnosis not present

## 2015-06-06 DIAGNOSIS — R339 Retention of urine, unspecified: Secondary | ICD-10-CM | POA: Diagnosis not present

## 2015-06-06 DIAGNOSIS — Z23 Encounter for immunization: Secondary | ICD-10-CM | POA: Diagnosis not present

## 2015-06-06 DIAGNOSIS — Z794 Long term (current) use of insulin: Secondary | ICD-10-CM | POA: Diagnosis not present

## 2015-06-06 DIAGNOSIS — G459 Transient cerebral ischemic attack, unspecified: Secondary | ICD-10-CM | POA: Diagnosis not present

## 2015-06-06 DIAGNOSIS — N183 Chronic kidney disease, stage 3 (moderate): Secondary | ICD-10-CM | POA: Diagnosis not present

## 2015-06-06 DIAGNOSIS — E1122 Type 2 diabetes mellitus with diabetic chronic kidney disease: Secondary | ICD-10-CM | POA: Diagnosis present

## 2015-06-06 DIAGNOSIS — Z8673 Personal history of transient ischemic attack (TIA), and cerebral infarction without residual deficits: Secondary | ICD-10-CM | POA: Diagnosis not present

## 2015-06-06 DIAGNOSIS — Z9181 History of falling: Secondary | ICD-10-CM | POA: Diagnosis not present

## 2015-06-06 DIAGNOSIS — K37 Unspecified appendicitis: Secondary | ICD-10-CM | POA: Diagnosis not present

## 2015-06-06 DIAGNOSIS — K353 Acute appendicitis with localized peritonitis: Secondary | ICD-10-CM | POA: Diagnosis not present

## 2015-06-06 HISTORY — PX: LAPAROSCOPIC APPENDECTOMY: SHX408

## 2015-06-06 HISTORY — PX: APPENDECTOMY: SHX54

## 2015-06-06 LAB — GLUCOSE, CAPILLARY
GLUCOSE-CAPILLARY: 118 mg/dL — AB (ref 65–99)
GLUCOSE-CAPILLARY: 151 mg/dL — AB (ref 65–99)
GLUCOSE-CAPILLARY: 175 mg/dL — AB (ref 65–99)
GLUCOSE-CAPILLARY: 132 mg/dL — AB (ref 65–99)
GLUCOSE-CAPILLARY: 146 mg/dL — AB (ref 65–99)

## 2015-06-06 SURGERY — APPENDECTOMY, LAPAROSCOPIC
Anesthesia: General | Site: Abdomen

## 2015-06-06 MED ORDER — ONDANSETRON HCL 4 MG/2ML IJ SOLN: 4.0000 mg | Freq: Four times a day (QID) | INTRAMUSCULAR | Status: DC | PRN

## 2015-06-06 MED ORDER — PROPOFOL 10 MG/ML IV BOLUS
INTRAVENOUS | Status: AC
Start: 2015-06-06 — End: 2015-06-06
  Filled 2015-06-06: qty 20

## 2015-06-06 MED ORDER — CITALOPRAM HYDROBROMIDE 20 MG PO TABS
10.0000 mg | ORAL_TABLET | Freq: Every day | ORAL | Status: DC
Start: 2015-06-06 — End: 2015-06-08
  Administered 2015-06-07 – 2015-06-08 (×2): 10 mg via ORAL
  Filled 2015-06-06 (×2): qty 1

## 2015-06-06 MED ORDER — MORPHINE SULFATE (PF) 4 MG/ML IV SOLN
4.0000 mg | INTRAVENOUS | Status: DC | PRN
  Administered 2015-06-06: 4 mg via INTRAVENOUS
  Filled 2015-06-06: qty 1

## 2015-06-06 MED ORDER — ONDANSETRON 4 MG PO TBDP: 4.0000 mg | ORAL_TABLET | Freq: Four times a day (QID) | ORAL | Status: DC | PRN

## 2015-06-06 MED ORDER — ONDANSETRON HCL 4 MG/2ML IJ SOLN: 4.0000 mg | Freq: Once | INTRAMUSCULAR | Status: DC | PRN

## 2015-06-06 MED ORDER — PIPERACILLIN-TAZOBACTAM 3.375 G IVPB
3.3750 g | Freq: Three times a day (TID) | INTRAVENOUS | Status: DC
  Administered 2015-06-06 – 2015-06-08 (×9): 3.375 g via INTRAVENOUS
  Filled 2015-06-06 (×10): qty 50

## 2015-06-06 MED ORDER — HYDROMORPHONE HCL 1 MG/ML IJ SOLN
0.5000 mg | INTRAMUSCULAR | Status: DC | PRN
Start: 2015-06-06 — End: 2015-06-08

## 2015-06-06 MED ORDER — ACETAMINOPHEN 650 MG RE SUPP: 650.0000 mg | Freq: Four times a day (QID) | RECTAL | Status: DC | PRN

## 2015-06-06 MED ORDER — DIPHENHYDRAMINE HCL 50 MG/ML IJ SOLN: 12.5000 mg | Freq: Four times a day (QID) | INTRAMUSCULAR | Status: DC | PRN

## 2015-06-06 MED ORDER — FENTANYL CITRATE (PF) 250 MCG/5ML IJ SOLN
INTRAMUSCULAR | Status: AC
  Filled 2015-06-06: qty 5

## 2015-06-06 MED ORDER — ONDANSETRON HCL 4 MG/2ML IJ SOLN
4.0000 mg | Freq: Three times a day (TID) | INTRAMUSCULAR | Status: DC | PRN
  Administered 2015-06-06: 4 mg via INTRAVENOUS
  Filled 2015-06-06: qty 2

## 2015-06-06 MED ORDER — GLYCOPYRROLATE 0.2 MG/ML IJ SOLN
INTRAMUSCULAR | Status: DC | PRN
  Administered 2015-06-06: 2 mg via INTRAVENOUS

## 2015-06-06 MED ORDER — OXYCODONE HCL 5 MG PO TABS
5.0000 mg | ORAL_TABLET | ORAL | Status: DC | PRN
  Administered 2015-06-07: 5 mg via ORAL
  Filled 2015-06-06: qty 2

## 2015-06-06 MED ORDER — PHENYLEPHRINE HCL 10 MG/ML IJ SOLN
INTRAMUSCULAR | Status: DC | PRN
  Administered 2015-06-06: 80 ug via INTRAVENOUS

## 2015-06-06 MED ORDER — ACETAMINOPHEN 325 MG PO TABS: 650.0000 mg | ORAL_TABLET | Freq: Four times a day (QID) | ORAL | Status: DC | PRN

## 2015-06-06 MED ORDER — LACTATED RINGERS IV SOLN
INTRAVENOUS | Status: DC | PRN
  Administered 2015-06-06: 11:00:00 via INTRAVENOUS

## 2015-06-06 MED ORDER — DIPHENHYDRAMINE HCL 12.5 MG/5ML PO ELIX
12.5000 mg | ORAL_SOLUTION | Freq: Four times a day (QID) | ORAL | Status: DC | PRN
Start: 2015-06-06 — End: 2015-06-08
  Filled 2015-06-06: qty 5

## 2015-06-06 MED ORDER — ZOLPIDEM TARTRATE 5 MG PO TABS
5.0000 mg | ORAL_TABLET | Freq: Every day | ORAL | Status: DC
  Administered 2015-06-06 – 2015-06-07 (×2): 5 mg via ORAL
  Filled 2015-06-06 (×2): qty 1

## 2015-06-06 MED ORDER — DEXTROSE-NACL 5-0.9 % IV SOLN
INTRAVENOUS | Status: DC
  Administered 2015-06-06: 14:00:00 via INTRAVENOUS

## 2015-06-06 MED ORDER — 0.9 % SODIUM CHLORIDE (POUR BTL) OPTIME
TOPICAL | Status: DC | PRN
  Administered 2015-06-06: 1000 mL

## 2015-06-06 MED ORDER — PIPERACILLIN-TAZOBACTAM 3.375 G IVPB 30 MIN
3.3750 g | Freq: Once | INTRAVENOUS | Status: AC
  Administered 2015-06-06: 3.375 g via INTRAVENOUS
  Filled 2015-06-06: qty 50

## 2015-06-06 MED ORDER — SUGAMMADEX SODIUM 200 MG/2ML IV SOLN
INTRAVENOUS | Status: DC | PRN
  Administered 2015-06-06: 270 mg via INTRAVENOUS

## 2015-06-06 MED ORDER — SODIUM CHLORIDE 0.9 % IR SOLN
Status: DC | PRN
  Administered 2015-06-06: 1000 mL

## 2015-06-06 MED ORDER — BUPIVACAINE HCL 0.25 % IJ SOLN
INTRAMUSCULAR | Status: DC | PRN
  Administered 2015-06-06: 7 mL

## 2015-06-06 MED ORDER — HYDROMORPHONE HCL 1 MG/ML IJ SOLN: 0.2500 mg | INTRAMUSCULAR | Status: DC | PRN

## 2015-06-06 MED ORDER — SENNA 8.6 MG PO TABS
1.0000 | ORAL_TABLET | Freq: Two times a day (BID) | ORAL | Status: DC
  Administered 2015-06-06 – 2015-06-08 (×4): 8.6 mg via ORAL
  Filled 2015-06-06 (×4): qty 1

## 2015-06-06 MED ORDER — SIMVASTATIN 40 MG PO TABS
40.0000 mg | ORAL_TABLET | Freq: Every day | ORAL | Status: DC
  Administered 2015-06-06 – 2015-06-07 (×2): 40 mg via ORAL
  Filled 2015-06-06 (×2): qty 1

## 2015-06-06 MED ORDER — SIMETHICONE 80 MG PO CHEW: 40.0000 mg | CHEWABLE_TABLET | Freq: Four times a day (QID) | ORAL | Status: DC | PRN

## 2015-06-06 MED ORDER — BUPIVACAINE HCL (PF) 0.25 % IJ SOLN
INTRAMUSCULAR | Status: AC
  Filled 2015-06-06: qty 30

## 2015-06-06 MED ORDER — BISACODYL 5 MG PO TBEC: 5.0000 mg | DELAYED_RELEASE_TABLET | Freq: Every day | ORAL | Status: DC | PRN

## 2015-06-06 MED ORDER — FENTANYL CITRATE (PF) 100 MCG/2ML IJ SOLN
INTRAMUSCULAR | Status: DC | PRN
  Administered 2015-06-06 (×2): 50 ug via INTRAVENOUS

## 2015-06-06 MED ORDER — GLYCOPYRROLATE 0.2 MG/ML IV SOSY
PREFILLED_SYRINGE | INTRAVENOUS | Status: AC
  Filled 2015-06-06: qty 3

## 2015-06-06 MED ORDER — LIDOCAINE HCL (CARDIAC) 20 MG/ML IV SOLN
INTRAVENOUS | Status: DC | PRN
  Administered 2015-06-06: 60 mg via INTRAVENOUS

## 2015-06-06 MED ORDER — HYDRALAZINE HCL 20 MG/ML IJ SOLN: 10.0000 mg | INTRAMUSCULAR | Status: DC | PRN

## 2015-06-06 MED ORDER — ROCURONIUM BROMIDE 100 MG/10ML IV SOLN
INTRAVENOUS | Status: DC | PRN
  Administered 2015-06-06: 40 mg via INTRAVENOUS

## 2015-06-06 MED ORDER — PNEUMOCOCCAL VAC POLYVALENT 25 MCG/0.5ML IJ INJ
0.5000 mL | INJECTION | INTRAMUSCULAR | Status: AC
  Administered 2015-06-07: 0.5 mL via INTRAMUSCULAR
  Filled 2015-06-06: qty 0.5

## 2015-06-06 MED ORDER — INSULIN GLARGINE 100 UNIT/ML ~~LOC~~ SOLN
5.0000 [IU] | Freq: Every day | SUBCUTANEOUS | Status: DC
  Administered 2015-06-06 – 2015-06-07 (×2): 5 [IU] via SUBCUTANEOUS
  Filled 2015-06-06 (×4): qty 0.05

## 2015-06-06 MED ORDER — ATROPINE SULFATE 0.4 MG/ML IV SOSY
PREFILLED_SYRINGE | INTRAVENOUS | Status: AC
  Filled 2015-06-06: qty 2.5

## 2015-06-06 MED ORDER — PROPOFOL 10 MG/ML IV BOLUS
INTRAVENOUS | Status: DC | PRN
  Administered 2015-06-06: 100 mg via INTRAVENOUS

## 2015-06-06 MED ORDER — INSULIN ASPART 100 UNIT/ML ~~LOC~~ SOLN
0.0000 [IU] | SUBCUTANEOUS | Status: DC
  Administered 2015-06-06: 2 [IU] via SUBCUTANEOUS
  Administered 2015-06-06: 3 [IU] via SUBCUTANEOUS
  Administered 2015-06-07: 2 [IU] via SUBCUTANEOUS
  Administered 2015-06-07: 3 [IU] via SUBCUTANEOUS
  Administered 2015-06-07: 2 [IU] via SUBCUTANEOUS
  Administered 2015-06-07: 3 [IU] via SUBCUTANEOUS
  Administered 2015-06-07: 2 [IU] via SUBCUTANEOUS
  Administered 2015-06-08: 3 [IU] via SUBCUTANEOUS
  Administered 2015-06-08 (×2): 2 [IU] via SUBCUTANEOUS

## 2015-06-06 MED ORDER — KCL IN DEXTROSE-NACL 20-5-0.45 MEQ/L-%-% IV SOLN
INTRAVENOUS | Status: DC
  Administered 2015-06-06 – 2015-06-07 (×2): via INTRAVENOUS
  Filled 2015-06-06 (×7): qty 1000

## 2015-06-06 MED ORDER — MORPHINE SULFATE (PF) 2 MG/ML IV SOLN
1.0000 mg | INTRAVENOUS | Status: DC | PRN
  Administered 2015-06-06 (×3): 2 mg via INTRAVENOUS
  Filled 2015-06-06 (×3): qty 1

## 2015-06-06 MED ORDER — OXYCODONE HCL 5 MG PO TABS
5.0000 mg | ORAL_TABLET | ORAL | Status: DC | PRN
  Administered 2015-06-06 – 2015-06-07 (×2): 10 mg via ORAL
  Filled 2015-06-06: qty 2
  Filled 2015-06-06: qty 1

## 2015-06-06 MED ORDER — LACTATED RINGERS IV SOLN: INTRAVENOUS | Status: DC

## 2015-06-06 MED ORDER — METOPROLOL TARTRATE 25 MG PO TABS
25.0000 mg | ORAL_TABLET | Freq: Two times a day (BID) | ORAL | Status: DC
  Administered 2015-06-06 – 2015-06-08 (×5): 25 mg via ORAL
  Filled 2015-06-06 (×6): qty 1

## 2015-06-06 MED ORDER — SODIUM CHLORIDE 0.9 % IV SOLN: INTRAVENOUS | Status: DC

## 2015-06-06 SURGICAL SUPPLY — 49 items
APL SKNCLS STERI-STRIP NONHPOA (GAUZE/BANDAGES/DRESSINGS) ×1
APPLIER CLIP 5 13 M/L LIGAMAX5 (MISCELLANEOUS)
APR CLP MED LRG 5 ANG JAW (MISCELLANEOUS)
BENZOIN TINCTURE PRP APPL 2/3 (GAUZE/BANDAGES/DRESSINGS) ×3 IMPLANT
BLADE SURG ROTATE 9660 (MISCELLANEOUS) IMPLANT
CANISTER SUCTION 2500CC (MISCELLANEOUS) ×3 IMPLANT
CHLORAPREP W/TINT 26ML (MISCELLANEOUS) ×3 IMPLANT
CLIP APPLIE 5 13 M/L LIGAMAX5 (MISCELLANEOUS) IMPLANT
CLOSURE WOUND 1/2 X4 (GAUZE/BANDAGES/DRESSINGS) ×1
COVER SURGICAL LIGHT HANDLE (MISCELLANEOUS) ×3 IMPLANT
COVER TRANSDUCER ULTRASND (DRAPES) ×3 IMPLANT
DEVICE TROCAR PUNCTURE CLOSURE (ENDOMECHANICALS) ×5 IMPLANT
ELECT REM PT RETURN 9FT ADLT (ELECTROSURGICAL) ×3
ELECTRODE REM PT RTRN 9FT ADLT (ELECTROSURGICAL) ×1 IMPLANT
ENDOLOOP SUT PDS II  0 18 (SUTURE) ×6
ENDOLOOP SUT PDS II 0 18 (SUTURE) ×3 IMPLANT
GAUZE SPONGE 2X2 8PLY STRL LF (GAUZE/BANDAGES/DRESSINGS) IMPLANT
GLOVE BIO SURGEON STRL SZ7.5 (GLOVE) ×5 IMPLANT
GLOVE BIOGEL PI IND STRL 6.5 (GLOVE) IMPLANT
GLOVE BIOGEL PI IND STRL 7.0 (GLOVE) IMPLANT
GLOVE BIOGEL PI INDICATOR 6.5 (GLOVE) ×2
GLOVE BIOGEL PI INDICATOR 7.0 (GLOVE) ×2
GLOVE ECLIPSE 6.0 STRL STRAW (GLOVE) ×2 IMPLANT
GOWN STRL REUS W/ TWL LRG LVL3 (GOWN DISPOSABLE) ×2 IMPLANT
GOWN STRL REUS W/ TWL XL LVL3 (GOWN DISPOSABLE) ×1 IMPLANT
GOWN STRL REUS W/TWL LRG LVL3 (GOWN DISPOSABLE) ×6
GOWN STRL REUS W/TWL XL LVL3 (GOWN DISPOSABLE) ×3
KIT BASIN OR (CUSTOM PROCEDURE TRAY) ×3 IMPLANT
KIT ROOM TURNOVER OR (KITS) ×3 IMPLANT
NDL INSUFFLATION 14GA 120MM (NEEDLE) ×1 IMPLANT
NEEDLE INSUFFLATION 14GA 120MM (NEEDLE) ×3 IMPLANT
NS IRRIG 1000ML POUR BTL (IV SOLUTION) ×3 IMPLANT
PAD ARMBOARD 7.5X6 YLW CONV (MISCELLANEOUS) ×6 IMPLANT
SCISSORS LAP 5X35 DISP (ENDOMECHANICALS) ×3 IMPLANT
SET IRRIG TUBING LAPAROSCOPIC (IRRIGATION / IRRIGATOR) ×3 IMPLANT
SLEEVE ENDOPATH XCEL 5M (ENDOMECHANICALS) ×3 IMPLANT
SPECIMEN JAR SMALL (MISCELLANEOUS) ×3 IMPLANT
SPONGE GAUZE 2X2 STER 10/PKG (GAUZE/BANDAGES/DRESSINGS) ×2
STRIP CLOSURE SKIN 1/2X4 (GAUZE/BANDAGES/DRESSINGS) ×1 IMPLANT
SUT MNCRL AB 3-0 PS2 18 (SUTURE) ×3 IMPLANT
SUT SILK 2 0 SH (SUTURE) IMPLANT
TAPE CLOTH SURG 4X10 WHT LF (GAUZE/BANDAGES/DRESSINGS) ×2 IMPLANT
TOWEL OR 17X24 6PK STRL BLUE (TOWEL DISPOSABLE) ×3 IMPLANT
TOWEL OR 17X26 10 PK STRL BLUE (TOWEL DISPOSABLE) ×3 IMPLANT
TRAY FOLEY CATH 16FR SILVER (SET/KITS/TRAYS/PACK) ×3 IMPLANT
TRAY LAPAROSCOPIC MC (CUSTOM PROCEDURE TRAY) ×3 IMPLANT
TROCAR XCEL NON-BLD 11X100MML (ENDOMECHANICALS) ×3 IMPLANT
TROCAR XCEL NON-BLD 5MMX100MML (ENDOMECHANICALS) ×3 IMPLANT
TUBING INSUFFLATION (TUBING) ×3 IMPLANT

## 2015-06-06 NOTE — ED Notes (Signed)
Attempted to call report

## 2015-06-06 NOTE — Care Management Obs Status (Signed)
MEDICARE OBSERVATION STATUS NOTIFICATION   Patient Details  Name: Casey Collins MRN: [id number] Date of Birth: [birth year]   Medicare Observation Status Notification Given:  Yes    Sharin Mons, RN 06/06/2015, 10:55 AM

## 2015-06-06 NOTE — Transfer of Care (Signed)
Immediate Anesthesia Transfer of Care Note  Patient: Casey Collins  Procedure(s) Performed: Procedure(s): APPENDECTOMY LAPAROSCOPIC (N/A)  Patient Location: PACU  Anesthesia Type:General  Level of Consciousness: awake, alert  and oriented  Airway & Oxygen Therapy: Patient Spontanous Breathing and Patient connected to face mask oxygen  Post-op Assessment: Report given to RN, Post -op Vital signs reviewed and stable and Patient moving all extremities X 4  Post vital signs: Reviewed and stable  Last Vitals:  Filed Vitals:   06/06/15 1051 06/06/15 1058  BP: 142/119 131/75  Pulse: 51 52  Temp:    Resp: 14     Last Pain:  Filed Vitals:   06/06/15 1240  PainSc: 4       Patients Stated Pain Goal: 4 ([id number])  Complications: No apparent anesthesia complications

## 2015-06-06 NOTE — H&P (Signed)
Casey Collins is an 80 y.o. male.   Chief Complaint: abdominal pain.   HPI:  Pt is a 80 yo M who presented to the ED with around 12 hours of abdominal pain.  The pain started in his RLQ.  He describes pain as coming in waves.  He has a history of CVA, DM, HTN, dyslipidemia.  He has not had fever/chills.  Moving made the pain worse.  No h/o abdominal surgery other than inguinal hernias.  He has not tried other methods to make pain go away.    Past Medical History  Diagnosis Date  . Stroke [facility])     Right side   . Diabetes mellitus   . Arthritis   . Hypertension   . Hyperlipemia   . Diverticulosis   . Colon polyp     Past Surgical History  Procedure Laterality Date  . Bilateral inguinal hernia      Family History  Problem Relation Age of Onset  . Colon cancer Neg Hx   . Prostate cancer Brother   . Diabetes Daughter    Social History:  reports that he has never smoked. He has never used smokeless tobacco. He reports that he does not drink alcohol or use illicit drugs.  Allergies: No Known Allergies  Medications Prior to Admission  Medication Sig Dispense Refill  . aspirin 325 MG tablet Take 1 tablet (325 mg total) by mouth daily. 30 tablet 0  . citalopram (CELEXA) 10 MG tablet Take 10 mg by mouth daily.     . insulin aspart (NOVOLOG) 100 UNIT/ML injection Inject 5 Units into the skin daily. Sliding scale    . insulin glargine (LANTUS) 100 UNIT/ML injection Inject 10 Units into the skin 2 (two) times daily.     . Maltodextrin-Xanthan Gum (RESOURCE THICKENUP CLEAR) POWD Take 120 g by mouth as needed. 30 Can 0  . metoprolol tartrate (LOPRESSOR) 25 MG tablet Take 25 mg by mouth 2 (two) times daily.     . ramipril (ALTACE) 5 MG capsule Take 5 mg by mouth daily.     . simvastatin (ZOCOR) 40 MG tablet Take 40 mg by mouth at bedtime.     Marland Kitchen zolpidem (AMBIEN) 10 MG tablet Take 10 mg by mouth at bedtime.       Results for orders placed or performed during the hospital encounter of  06/05/15 (from the past 48 hour(s))  Lipase, blood     Status: None   Collection Time: 06/05/15  8:57 PM  Result Value Ref Range   Lipase 45 11 - 51 U/L  Comprehensive metabolic panel     Status: Abnormal   Collection Time: 06/05/15  8:57 PM  Result Value Ref Range   Sodium 136 135 - 145 mmol/L   Potassium 4.2 3.5 - 5.1 mmol/L   Chloride 102 101 - 111 mmol/L   CO2 23 22 - 32 mmol/L   Glucose, Bld 131 (H) 65 - 99 mg/dL   BUN 27 (H) 6 - 20 mg/dL   Creatinine, Ser 1.43 (H) 0.61 - 1.24 mg/dL   Calcium 9.2 8.9 - 10.3 mg/dL   Total Protein 7.0 6.5 - 8.1 g/dL   Albumin 4.0 3.5 - 5.0 g/dL   AST 30 15 - 41 U/L   ALT 23 17 - 63 U/L   Alkaline Phosphatase 65 38 - 126 U/L   Total Bilirubin 0.7 0.3 - 1.2 mg/dL   GFR calc non Af Amer 45 (L) >60 mL/min   GFR  calc Af Amer 53 (L) >60 mL/min    Comment: (NOTE) The eGFR has been calculated using the CKD EPI equation. This calculation has not been validated in all clinical situations. eGFR's persistently <60 mL/min signify possible Chronic Kidney Disease.    Anion gap 11 5 - 15  CBC     Status: Abnormal   Collection Time: 06/05/15  8:57 PM  Result Value Ref Range   WBC 17.3 (H) 4.0 - 10.5 K/uL   RBC 4.55 4.22 - 5.81 MIL/uL   Hemoglobin 13.9 13.0 - 17.0 g/dL   HCT 42.6 39.0 - 52.0 %   MCV 93.6 78.0 - 100.0 fL   MCH 30.5 26.0 - 34.0 pg   MCHC 32.6 30.0 - 36.0 g/dL   RDW 13.3 11.5 - 15.5 %   Platelets 195 150 - 400 K/uL  Urinalysis, Routine w reflex microscopic     Status: Abnormal   Collection Time: 06/05/15  9:01 PM  Result Value Ref Range   Color, Urine YELLOW YELLOW   APPearance HAZY (A) CLEAR   Specific Gravity, Urine 1.020 1.005 - 1.030   pH 5.0 5.0 - 8.0   Glucose, UA NEGATIVE NEGATIVE mg/dL   Hgb urine dipstick NEGATIVE NEGATIVE   Bilirubin Urine NEGATIVE NEGATIVE   Ketones, ur NEGATIVE NEGATIVE mg/dL   Protein, ur NEGATIVE NEGATIVE mg/dL   Nitrite NEGATIVE NEGATIVE   Leukocytes, UA NEGATIVE NEGATIVE    Comment: MICROSCOPIC  NOT DONE ON URINES WITH NEGATIVE PROTEIN, BLOOD, LEUKOCYTES, NITRITE, OR GLUCOSE <1000 mg/dL.   Dg Chest 2 View  06/05/2015  CLINICAL DATA:  Chest pain, chills and leukocytosis for 1 day. EXAM: CHEST  2 VIEW COMPARISON:  12/09/2012 FINDINGS: There is unchanged moderate cardiomegaly and aortic tortuosity. There is stable chronic appearing blunting of the left lateral costophrenic angle. Mild hyperinflation is also unchanged. The lungs are otherwise clear. The pulmonary vasculature is normal. IMPRESSION: Hyperinflation, cardiomegaly, chronic costophrenic angle blunting. No acute findings are evident. Electronically Signed   By: Andreas Newport M.D.   On: 06/05/2015 22:43   Ct Abdomen Pelvis W Contrast  06/06/2015  CLINICAL DATA:  Mid abdominal pain since noon today. EXAM: CT ABDOMEN AND PELVIS WITH CONTRAST TECHNIQUE: Multidetector CT imaging of the abdomen and pelvis was performed using the standard protocol following bolus administration of intravenous contrast. CONTRAST:  68m ISOVUE-300 IOPAMIDOL (ISOVUE-300) INJECTION 61% COMPARISON:  None. FINDINGS: There is enlargement and acute inflammation of the appendix. No extraluminal air. No abscess. No other acute findings are evident in the abdomen or pelvis. Liver and bile ducts are normal. There are several calculi within the gallbladder lumen measuring up to 9 mm. There are normal appearances of the spleen, pancreas, adrenals and kidneys. Collecting systems, ureters and urinary bladder are unremarkable. Stomach and small bowel are unremarkable. Mild colonic diverticulosis. The abdominal aorta is normal in caliber with moderate atherosclerotic calcification. No significant abnormalities evident in the lower chest. There is no significant musculoskeletal lesion. There is moderately severe degenerative lumbar disc disease at L5-S1. IMPRESSION: Acute appendicitis. No abscess. These results were called by telephone at the time of interpretation on 06/06/2015 at 12:21  am to the ER physician, who verbally acknowledged these results. Electronically Signed   By: DAndreas NewportM.D.   On: 06/06/2015 00:24   Pt very sleepy.  Not sure if ROS 100 % accurate due to the fact that he fell asleep multiple times during interview.   Review of Systems  Unable to perform ROS: medical condition  Constitutional: Negative.   HENT: Negative.   Eyes: Negative.   Respiratory: Negative.   Cardiovascular: Negative.   Gastrointestinal: Positive for nausea and abdominal pain.  Genitourinary: Negative.   Musculoskeletal: Negative.   Skin: Negative.   Neurological: Negative.   Endo/Heme/Allergies: Negative.   Psychiatric/Behavioral: Negative.     Blood pressure 100/55, pulse 57, temperature 97.9 F (36.6 C), temperature source Oral, resp. rate 18, SpO2 96 %. Physical Exam  Constitutional: He is oriented to person, place, and time. He appears well-developed and well-nourished. No distress.  HENT:  Head: Normocephalic and atraumatic.  Eyes: Conjunctivae are normal. Pupils are equal, round, and reactive to light. No scleral icterus.  Neck: Neck supple. No JVD present. No thyromegaly present.  Cardiovascular: Normal rate, normal heart sounds and intact distal pulses.   Respiratory: Effort normal and breath sounds normal. No respiratory distress.  GI: Soft. He exhibits distension (slightly). He exhibits no mass. There is tenderness (mild RLQ). There is no rebound and no guarding.  Musculoskeletal: He exhibits no edema or tenderness.  Neurological: He is oriented to person, place, and time. Coordination normal.  sleepy  Skin: Skin is warm and dry. No rash noted. He is not diaphoretic. No erythema. No pallor.  Psychiatric: He has a normal mood and affect. His behavior is normal. Judgment and thought content normal.     Assessment/Plan Acute appendicitis. NPO IVF IV antibiotics DM - SSI + lantus. Antihypertensives  Pain and nausea control To OR with Dr. Rosendo Gros for  lap appy later today.  Appendectomy was described to the patient.  The incisions and surgical technique were explained.  The patient was advised that some of the hair on the abdomen would be clipped, and that a foley catheter would be placed.  I advised the patient of the risks of surgery including, but not limited to, bleeding, infection, damage to other structures, risk of an open operation, risk of abscess, and risk of blood clot.  The recovery was also described to the patient.  He was advised that he will have lifting restrictions for 2 weeks.      Stark Klein, MD 06/06/2015, 6:49 AM

## 2015-06-06 NOTE — Progress Notes (Signed)
Patient is still a little groggy, but I reviewed the purpose of the procedure to remove his appendix.  We discussed the risks/benfits including bleeding, infection, conversion to open, stump leak, drain placement, injury to other intra-abdominal structures.  Cardiac, respiratory problems peri-operatively.  The patient would like to proceed with surgery to remove his appendix.  Discussed post-operative recovery and criteria for being discharged after surgery.    Jomarie Longs, PA-C General Surgery [facility] [phone]

## 2015-06-06 NOTE — Progress Notes (Signed)
Pt admitted to room 531 alert and oriented to person,but not to place and time.His speech is clear, but pt speaks in "whispers". Verbalized understanding of safety and proper use of call bell. Bed alarm on and fall bracelet for cont. Fall precautions. No skin issues noted, pt does not complain of pain.

## 2015-06-06 NOTE — ED Provider Notes (Signed)
CT scan reveals acute appendicitis. Patient given Zosyn as per antibiotic protocol. We'll consult general surgery for admission.  12:48 AM I spoke with Dr. Barry Dienes who accepts for admission.    Everlene Balls, MD 06/06/15 [phone]

## 2015-06-06 NOTE — Anesthesia Postprocedure Evaluation (Signed)
Anesthesia Post Note  Patient: Casey Collins  Procedure(s) Performed: Procedure(s) (LRB): APPENDECTOMY LAPAROSCOPIC (N/A)  Patient location during evaluation: PACU Anesthesia Type: General Level of consciousness: awake and alert Pain management: pain level controlled Vital Signs Assessment: post-procedure vital signs reviewed and stable Respiratory status: spontaneous breathing, nonlabored ventilation, respiratory function stable and patient connected to nasal cannula oxygen Cardiovascular status: blood pressure returned to baseline and stable Postop Assessment: no signs of nausea or vomiting Anesthetic complications: no    Last Vitals:  Filed Vitals:   06/06/15 1245 06/06/15 1300  BP: 126/57 119/59  Pulse: 81 77  Temp:    Resp: 18 16    Last Pain:  Filed Vitals:   06/06/15 1315  PainSc: 2                  Zenaida Deed

## 2015-06-06 NOTE — Anesthesia Preprocedure Evaluation (Addendum)
Anesthesia Evaluation  Patient identified by MRN, date of birth, ID band Patient awake    Reviewed: Allergy & Precautions, H&P , NPO status , Patient's Chart, lab work & pertinent test results  History of Anesthesia Complications Negative for: history of anesthetic complications  Airway Mallampati: II  TM Distance: >3 FB Neck ROM: full    Dental no notable dental hx. (+) Poor Dentition, Dental Advidsory Given   Pulmonary neg pulmonary ROS,    Pulmonary exam normal breath sounds clear to auscultation       Cardiovascular hypertension, On Medications Normal cardiovascular exam Rhythm:regular Rate:Normal  Echo: EF 55%, mild AI   Neuro/Psych TIACVA, Residual Symptoms    GI/Hepatic negative GI ROS, Neg liver ROS,   Endo/Other  diabetes, Type 2  Renal/GU Renal disease     Musculoskeletal  (+) Arthritis ,   Abdominal   Peds  Hematology negative hematology ROS (+)   Anesthesia Other Findings   Reproductive/Obstetrics negative OB ROS                          Anesthesia Physical Anesthesia Plan  ASA: III  Anesthesia Plan: General   Post-op Pain Management:    Induction: Intravenous  Airway Management Planned: Oral ETT  Additional Equipment:   Intra-op Plan:   Post-operative Plan:   Informed Consent: I have reviewed the patients History and Physical, chart, labs and discussed the procedure including the risks, benefits and alternatives for the proposed anesthesia with the patient or authorized representative who has indicated his/her understanding and acceptance.   Dental Advisory Given  Plan Discussed with: Anesthesiologist, CRNA and Surgeon  Anesthesia Plan Comments: (Able to follow commands with bilateral upper extremeties but is slow to respond, speech appears slow and is barely audible but is appropriate when questioned, will keep BP baseline)       Anesthesia Quick  Evaluation

## 2015-06-06 NOTE — Op Note (Signed)
06/06/2015  12:21 PM  PATIENT:  Casey Collins  80 y.o. male  PRE-OPERATIVE DIAGNOSIS:  Appendicitis  POST-OPERATIVE DIAGNOSIS:   Acute, non perforated Appendicitis  PROCEDURE:  Procedure(s): APPENDECTOMY LAPAROSCOPIC (N/A)  SURGEON:  Surgeon(s) and Role:    * Ralene Ok, MD - Primary  ASSISTANTS: Dyann Kief, MD   ANESTHESIA:   local and general  EBL:     BLOOD ADMINISTERED:none  DRAINS: none   LOCAL MEDICATIONS USED:  BUPIVICAINE   SPECIMEN:  Source of Specimen:  appendix  DISPOSITION OF SPECIMEN:  PATHOLOGY  COUNTS:  YES  TOURNIQUET:  * No tourniquets in log *  DICTATION: .Dragon Dictation  Findings:  The patient had a acutely inflammed non perforated appendix  Indications for procedure:  The patient is a 80 year old male with a history of periumbilical pain localized in the right lower quadrant patient had a CT scan which revealed signs consistent with acute appendicitis the patient back in for laparoscopic appendectomy.  Details of the procedure:The patient was taken back to the operating room. The patient was placed in supine position with bilateral SCDs in place.  A foley catheter was place. The patient was prepped and draped in the usual sterile fashion.  After appropriate anitbiotics were confirmed, a time-out was confirmed and all facts were verified.    A pneumoperitoneum of 14 mmHg was obtained via a Veress needle technique in the left lower quadrant quadrant.  A 5 mm trocar and 5 mm camera then placed intra-abdominally there is no injury to any intra-abdominal organs a 10 mm infraumbilical port was placed and direct visualization as was a 5 mm port in the suprapubic area.   The appendix was identified and seen to be non-perforated.  The appendix was cleaned down to the appendiceal base. The mesoappendix was then incised and the appendiceal artery was cauterized.  The the appendiceal base was clean.  At this time an Endoloop was placed proximallyx2 and  one distally and the appendix was transected between these 2. A retrieval bag was then placed into the abdomen and the specimen placed in the bag. The appendiceal stump was cauterized. We evacuate the fluid from the pelvis until the effluent was clear.  The appendix and retrieval  bag was then retrieved via the supraumbilical port. #1 Vicryl x1 was used to reapproximate the fascia at the umbilical port site x1. The skin was reapproximated all port sites 3-0 Monocryl subcuticular fashion. The skin was dressed with steri-strips, guaze, and tape.  The patient had the foley removed. The patient was awakened from general anesthesia was taken to recovery room in stable condition.      PLAN OF CARE: Admit for overnight observation  PATIENT DISPOSITION:  PACU - hemodynamically stable.   Delay start of Pharmacological VTE agent (>24hrs) due to surgical blood loss or risk of bleeding: not applicable

## 2015-06-07 ENCOUNTER — Encounter (HOSPITAL_COMMUNITY): Payer: Self-pay | Admitting: General Surgery

## 2015-06-07 LAB — GLUCOSE, CAPILLARY
GLUCOSE-CAPILLARY: 131 mg/dL — AB (ref 65–99)
GLUCOSE-CAPILLARY: 137 mg/dL — AB (ref 65–99)
GLUCOSE-CAPILLARY: 148 mg/dL — AB (ref 65–99)
GLUCOSE-CAPILLARY: 188 mg/dL — AB (ref 65–99)
Glucose-Capillary: 113 mg/dL — ABNORMAL HIGH (ref 65–99)
Glucose-Capillary: 126 mg/dL — ABNORMAL HIGH (ref 65–99)
Glucose-Capillary: 173 mg/dL — ABNORMAL HIGH (ref 65–99)

## 2015-06-07 LAB — CBC
HCT: 36.8 % — ABNORMAL LOW (ref 39.0–52.0)
Hemoglobin: 12 g/dL — ABNORMAL LOW (ref 13.0–17.0)
MCH: 30.9 pg (ref 26.0–34.0)
MCHC: 32.6 g/dL (ref 30.0–36.0)
MCV: 94.8 fL (ref 78.0–100.0)
PLATELETS: 186 10*3/uL (ref 150–400)
RBC: 3.88 MIL/uL — ABNORMAL LOW (ref 4.22–5.81)
RDW: 13.9 % (ref 11.5–15.5)
WBC: 13.6 10*3/uL — ABNORMAL HIGH (ref 4.0–10.5)

## 2015-06-07 LAB — BASIC METABOLIC PANEL
ANION GAP: 11 (ref 5–15)
BUN: 17 mg/dL (ref 6–20)
CALCIUM: 8.5 mg/dL — AB (ref 8.9–10.3)
CO2: 26 mmol/L (ref 22–32)
CREATININE: 1.51 mg/dL — AB (ref 0.61–1.24)
Chloride: 103 mmol/L (ref 101–111)
GFR, EST AFRICAN AMERICAN: 49 mL/min — AB (ref >60)
GFR, EST NON AFRICAN AMERICAN: 42 mL/min — AB (ref >60)
GLUCOSE: 147 mg/dL — AB (ref 65–99)
POTASSIUM: 4.2 mmol/L (ref 3.5–5.1)
Sodium: 140 mmol/L (ref 135–145)

## 2015-06-07 MED ORDER — HEPARIN SODIUM (PORCINE) 5000 UNIT/ML IJ SOLN
5000.0000 [IU] | Freq: Three times a day (TID) | INTRAMUSCULAR | Status: DC
  Administered 2015-06-07 – 2015-06-08 (×4): 5000 [IU] via SUBCUTANEOUS
  Filled 2015-06-07 (×4): qty 1

## 2015-06-07 MED ORDER — MORPHINE SULFATE (PF) 2 MG/ML IV SOLN
1.0000 mg | INTRAVENOUS | Status: DC | PRN
  Administered 2015-06-08: 2 mg via INTRAVENOUS
  Filled 2015-06-07: qty 1

## 2015-06-07 NOTE — Progress Notes (Signed)
[facility] Progress Note  1 Day Post-Op  Subjective: Pt doing okay, but had to be I&O cathed earlier, now he has the urge to go.  Tolerating clears, thirsty, but not really hungry.  Not been OOB.  Nurses getting him up to the chair.  IS pending.  Objective: Vital signs in last 24 hours: Temp:  [97.5 F (36.4 C)-98.1 F (36.7 C)] 98.1 F (36.7 C) (05/10 0529) Pulse Rate:  [51-93] 56 (05/10 0529) Resp:  [14-22] 16 (05/10 0529) BP: (119-143)/(46-119) 139/55 mmHg (05/10 0529) SpO2:  [91 %-100 %] 95 % (05/10 0529) Weight:  [201 lb 11.5 oz (91.5 kg)] 201 lb 11.5 oz (91.5 kg) (05/09 2043) Last BM Date: 06/04/15  Intake/Output from previous day: 05/09 0701 - 05/10 0700 In: 1705.8 [I.V.:1455.8; IV Piggyback:250] Out: 730 [Urine:725; Blood:5] Intake/Output this shift:    PE: Gen:  Alert, NAD, pleasant Abd: Soft, ND, mild tenderness over incision sites, +BS, no HSM, incisions C/D/I   Lab Results:   Recent Labs  06/05/15 2057 06/07/15 0532  WBC 17.3* 13.6*  HGB 13.9 12.0*  HCT 42.6 36.8*  PLT 195 186   BMET  Recent Labs  06/05/15 2057 06/07/15 0532  NA 136 140  K 4.2 4.2  CL 102 103  CO2 23 26  GLUCOSE 131* 147*  BUN 27* 17  CREATININE 1.43* 1.51*  CALCIUM 9.2 8.5*   PT/INR No results for input(s): LABPROT, INR in the last 72 hours. CMP     Component Value Date/Time   NA 140 06/07/2015 0532   K 4.2 06/07/2015 0532   CL 103 06/07/2015 0532   CO2 26 06/07/2015 0532   GLUCOSE 147* 06/07/2015 0532   BUN 17 06/07/2015 0532   CREATININE 1.51* 06/07/2015 0532   CALCIUM 8.5* 06/07/2015 0532   PROT 7.0 06/05/2015 2057   ALBUMIN 4.0 06/05/2015 2057   AST 30 06/05/2015 2057   ALT 23 06/05/2015 2057   ALKPHOS 65 06/05/2015 2057   BILITOT 0.7 06/05/2015 2057   GFRNONAA 42* 06/07/2015 0532   GFRAA 49* 06/07/2015 0532   Lipase     Component Value Date/Time   LIPASE 45 06/05/2015 2057       Studies/Results: Dg Chest 2 View  06/05/2015   CLINICAL DATA:  Chest pain, chills and leukocytosis for 1 day. EXAM: CHEST  2 VIEW COMPARISON:  12/09/2012 FINDINGS: There is unchanged moderate cardiomegaly and aortic tortuosity. There is stable chronic appearing blunting of the left lateral costophrenic angle. Mild hyperinflation is also unchanged. The lungs are otherwise clear. The pulmonary vasculature is normal. IMPRESSION: Hyperinflation, cardiomegaly, chronic costophrenic angle blunting. No acute findings are evident. Electronically Signed   By: Andreas Newport M.D.   On: 06/05/2015 22:43   Ct Abdomen Pelvis W Contrast  06/06/2015  CLINICAL DATA:  Mid abdominal pain since noon today. EXAM: CT ABDOMEN AND PELVIS WITH CONTRAST TECHNIQUE: Multidetector CT imaging of the abdomen and pelvis was performed using the standard protocol following bolus administration of intravenous contrast. CONTRAST:  70mL ISOVUE-300 IOPAMIDOL (ISOVUE-300) INJECTION 61% COMPARISON:  None. FINDINGS: There is enlargement and acute inflammation of the appendix. No extraluminal air. No abscess. No other acute findings are evident in the abdomen or pelvis. Liver and bile ducts are normal. There are several calculi within the gallbladder lumen measuring up to 9 mm. There are normal appearances of the spleen, pancreas, adrenals and kidneys. Collecting systems, ureters and urinary bladder are unremarkable. Stomach and small bowel are unremarkable. Mild colonic diverticulosis. The  abdominal aorta is normal in caliber with moderate atherosclerotic calcification. No significant abnormalities evident in the lower chest. There is no significant musculoskeletal lesion. There is moderately severe degenerative lumbar disc disease at L5-S1. IMPRESSION: Acute appendicitis. No abscess. These results were called by telephone at the time of interpretation on 06/06/2015 at 12:21 am to the ER physician, who verbally acknowledged these results. Electronically Signed   By: Andreas Newport M.D.   On:  06/06/2015 00:24    Anti-infectives: Anti-infectives    Start     Dose/Rate Route Frequency Ordered Stop   06/06/15 0800  piperacillin-tazobactam (ZOSYN) IVPB 3.375 g     3.375 g 12.5 mL/hr over 240 Minutes Intravenous Every 8 hours 06/06/15 0657     06/06/15 0030  piperacillin-tazobactam (ZOSYN) IVPB 3.375 g     3.375 g 100 mL/hr over 30 Minutes Intravenous  Once 06/06/15 0023 06/06/15 0317       Assessment/Plan Acute appendicitis POD #1 s/p lap appy -Ambulate and IS -SCD's and start SQ heparin Urinary retention - I&O cath earlier, but now starting to urinate on own CKD - likely at baseline, but 2 years ago was 1.24, today 1.51, IVF Deconditioning - up with nursing staff and PT consult if he's having trouble FEN - fulls, advance as tolerated Disp - home tomorrow if doing well, recheck labs in am    LOS: 1 day    Casey Collins 06/07/2015, 9:03 AM Pager: [phone]  (7am - 4:30pm M-F; 7am - 11:30am Sa/Su)

## 2015-06-08 LAB — BASIC METABOLIC PANEL
ANION GAP: 10 (ref 5–15)
BUN: 11 mg/dL (ref 6–20)
CO2: 27 mmol/L (ref 22–32)
Calcium: 8.7 mg/dL — ABNORMAL LOW (ref 8.9–10.3)
Chloride: 102 mmol/L (ref 101–111)
Creatinine, Ser: 1.44 mg/dL — ABNORMAL HIGH (ref 0.61–1.24)
GFR calc Af Amer: 52 mL/min — ABNORMAL LOW (ref >60)
GFR, EST NON AFRICAN AMERICAN: 45 mL/min — AB (ref >60)
GLUCOSE: 136 mg/dL — AB (ref 65–99)
POTASSIUM: 3.9 mmol/L (ref 3.5–5.1)
Sodium: 139 mmol/L (ref 135–145)

## 2015-06-08 LAB — CBC
HEMATOCRIT: 34.5 % — AB (ref 39.0–52.0)
HEMOGLOBIN: 11.2 g/dL — AB (ref 13.0–17.0)
MCH: 30.7 pg (ref 26.0–34.0)
MCHC: 32.5 g/dL (ref 30.0–36.0)
MCV: 94.5 fL (ref 78.0–100.0)
PLATELETS: 171 10*3/uL (ref 150–400)
RBC: 3.65 MIL/uL — AB (ref 4.22–5.81)
RDW: 13.6 % (ref 11.5–15.5)
WBC: 11 10*3/uL — AB (ref 4.0–10.5)

## 2015-06-08 LAB — GLUCOSE, CAPILLARY
GLUCOSE-CAPILLARY: 179 mg/dL — AB (ref 65–99)
Glucose-Capillary: 129 mg/dL — ABNORMAL HIGH (ref 65–99)
Glucose-Capillary: 126 mg/dL — ABNORMAL HIGH (ref 65–99)

## 2015-06-08 MED ORDER — OXYCODONE HCL 5 MG PO TABS: 2.5000 mg | ORAL_TABLET | Freq: Four times a day (QID) | ORAL | Status: DC | PRN

## 2015-06-08 MED ORDER — RAMIPRIL 2.5 MG PO CAPS
5.0000 mg | ORAL_CAPSULE | Freq: Every day | ORAL | Status: DC
  Administered 2015-06-08: 5 mg via ORAL
  Filled 2015-06-08: qty 2

## 2015-06-08 MED ORDER — ASPIRIN EC 81 MG PO TBEC
81.0000 mg | DELAYED_RELEASE_TABLET | Freq: Every day | ORAL | Status: DC
  Administered 2015-06-08: 81 mg via ORAL
  Filled 2015-06-08: qty 1

## 2015-06-08 MED ORDER — CLOPIDOGREL BISULFATE 75 MG PO TABS
75.0000 mg | ORAL_TABLET | Freq: Every day | ORAL | Status: DC
  Administered 2015-06-08: 75 mg via ORAL
  Filled 2015-06-08: qty 1

## 2015-06-08 NOTE — Discharge Instructions (Signed)
Your appointment is at 10:15am, please arrive at least 61min before your appointment to complete your check in paperwork.  If you are unable to arrive 76min prior to your appointment time we may have to cancel or reschedule you.  LAPAROSCOPIC SURGERY: POST OP INSTRUCTIONS  1. DIET: Follow a light bland diet the first 24 hours after arrival home, such as soup, liquids, crackers, etc. Be sure to include lots of fluids daily. Avoid fast food or heavy meals as your are more likely to get nauseated. Eat a low fat the next few days after surgery.  2. Take your usually prescribed home medications unless otherwise directed. 3. PAIN CONTROL:  1. Pain is best controlled by a usual combination of three different methods TOGETHER:  1. Ice/Heat 2. Over the counter pain medication 3. Prescription pain medication 2. Most patients will experience some swelling and bruising around the incisions. Ice packs or heating pads (30-60 minutes up to 6 times a day) will help. Use ice for the first few days to help decrease swelling and bruising, then switch to heat to help relax tight/sore spots and speed recovery. Some people prefer to use ice alone, heat alone, alternating between ice & heat. Experiment to what works for you. Swelling and bruising can take several weeks to resolve.  3. It is helpful to take an over-the-counter pain medication regularly for the first few weeks. Choose one of the following that works best for you:  1. Naproxen (Aleve, etc) Two 220mg  tabs twice a day 2. Ibuprofen (Advil, etc) Three 200mg  tabs four times a day (every meal & bedtime) 3. Acetaminophen (Tylenol, etc) 500-650mg  four times a day (every meal & bedtime) 4. A prescription for pain medication (such as oxycodone, hydrocodone, etc) should be given to you upon discharge. Take your pain medication as prescribed.  1. If you are having problems/concerns with the prescription medicine (does not control pain, nausea, vomiting, rash, itching,  etc), please call us [phone] to see if we need to switch you to a different pain medicine that will work better for you and/or control your side effect better. 2. If you need a refill on your pain medication, please contact your pharmacy. They will contact our office to request authorization. Prescriptions will not be filled after 5 pm or on week-ends. 4. Avoid getting constipated. Between the surgery and the pain medications, it is common to experience some constipation. Increasing fluid intake and taking a fiber supplement (such as Metamucil, Citrucel, FiberCon, MiraLax, etc) 1-2 times a day regularly will usually help prevent this problem from occurring. A mild laxative (prune juice, Milk of Magnesia, MiraLax, etc) should be taken according to package directions if there are no bowel movements after 48 hours.  5. Watch out for diarrhea. If you have many loose bowel movements, simplify your diet to bland foods & liquids for a few days. Stop any stool softeners and decrease your fiber supplement. Switching to mild anti-diarrheal medications (Kayopectate, Pepto Bismol) can help. If this worsens or does not improve, please call us. 6. Wash / shower every day. You may shower over the dressings as they are waterproof. Continue to shower over incision(s) after the dressing is off. 7. Remove your waterproof bandages 5 days after surgery. You may leave the incision open to air. You may replace a dressing/Band-Aid to cover the incision for comfort if you wish.  8. ACTIVITIES as tolerated:  1. You may resume regular (light) daily activities beginning the next day--such as daily self-care,  walking, climbing stairs--gradually increasing activities as tolerated. If you can walk 30 minutes without difficulty, it is safe to try more intense activity such as jogging, treadmill, bicycling, low-impact aerobics, swimming, etc. 2. Save the most intensive and strenuous activity for last such as sit-ups, heavy lifting,  contact sports, etc Refrain from any heavy lifting or straining until you are off narcotics for pain control.  3. DO NOT PUSH THROUGH PAIN. Let pain be your guide: If it hurts to do something, don't do it. Pain is your body warning you to avoid that activity for another week until the pain goes down. 4. You may drive when you are no longer taking prescription pain medication, you can comfortably wear a seatbelt, and you can safely maneuver your car and apply brakes. 5. You may have sexual intercourse when it is comfortable.  9. FOLLOW UP in our office  1. Please call [facility] at [phone] to set up an appointment to see your surgeon in the office for a follow-up appointment approximately 2-3 weeks after your surgery. 2. Make sure that you call for this appointment the day you arrive home to insure a convenient appointment time.      10. IF YOU HAVE DISABILITY OR FAMILY LEAVE FORMS, BRING THEM TO THE               OFFICE FOR PROCESSING.   WHEN TO CALL us [phone]:  1. Poor pain control 2. Reactions / problems with new medications (rash/itching, nausea, etc)  3. Fever over 101.5 F (38.5 C) 4. Inability to urinate 5. Nausea and/or vomiting 6. Worsening swelling or bruising 7. Continued bleeding from incision. 8. Increased pain, redness, or drainage from the incision  The clinic staff is available to answer your questions during regular business hours (8:30am-5pm). Please dont hesitate to call and ask to speak to one of our nurses for clinical concerns.  If you have a medical emergency, go to the nearest emergency room or call 911.  A surgeon from [facility] is always on call at the [facility], [place], [place], [place], [place] [address] ?  MAIN: [phone] ? TOLL FREE: [phone] ?  FAX [phone]  [email or web address]

## 2015-06-08 NOTE — Evaluation (Signed)
Physical Therapy Evaluation Patient Details Name: Casey Collins MRN: [id number] DOB: 12/22/1935 Today's Date: 06/08/2015   History of Present Illness  Pt is a 80 y/o M who presented to the ED with around 12 hours of abdominal pain.  He has a history of CVA, DM, HTN, dyslipidemia.Workup showed acute appendicitis.Patient was admitted and underwent laparoscopic appendectomy on 06/06/15.  Clinical Impression  Pt admitted with above diagnosis. Pt currently with functional limitations due to the deficits listed below (see PT Problem List). At the time of PT eval pt was able to perform transfers and minimal ambulation with HHA/min assist for balance support and safety. Pt with residual right sided weakness from prior stroke, and per son-in-law, has been able to manage well at home with minimal assistance from family. Pt agreeable to HHPT and use of RW at home until back to baseline. Pt will benefit from skilled PT to increase their independence and safety with mobility to allow discharge to the venue listed below.       Follow Up Recommendations Home health PT;Supervision/Assistance - 24 hour    Equipment Recommendations  None recommended by PT    Recommendations for Other Services       Precautions / Restrictions Precautions Precautions: Fall Restrictions Weight Bearing Restrictions: No      Mobility  Bed Mobility               General bed mobility comments: Pt received exiting bathroom  Transfers Overall transfer level: Needs assistance Equipment used: 1 person hand held assist Transfers: Sit to/from Stand Sit to Stand: Min guard         General transfer comment: Close guard for safety. Pt appeared unsteady but did not require any assistance.   Ambulation/Gait Ambulation/Gait assistance: Min assist Ambulation Distance (Feet): 25 Feet Assistive device: 1 person hand held assist Gait Pattern/deviations: Step-through pattern;Shuffle;Trunk flexed Gait velocity:  Decreased Gait velocity interpretation: Below normal speed for age/gender General Gait Details: Pt ambulated in room with HHA for balance support. Pt reaching for sink for support as he passed by from bathroom to bed.   Stairs            Wheelchair Mobility    Modified Rankin (Stroke Patients Only)       Balance Overall balance assessment: Needs assistance Sitting-balance support: Feet supported;No upper extremity supported Sitting balance-Leahy Scale: Fair     Standing balance support: Single extremity supported;During functional activity Standing balance-Leahy Scale: Poor Standing balance comment: Min assist for dynamic activity.                              Pertinent Vitals/Pain Pain Assessment: Faces Faces Pain Scale: No hurt    Home Living Family/patient expects to be discharged to:: Private residence Living Arrangements: Children Available Help at Discharge: Family;Available 24 hours/day Type of Home: House Home Access: Ramped entrance     Home Layout: One level (1 step down into room) Home Equipment: Tub bench;Wheelchair - [employer] - 2 wheels;Hand held shower head;Grab bars - toilet      Prior Function Level of Independence: Independent               Hand Dominance   Dominant Hand: Right    Extremity/Trunk Assessment   Upper Extremity Assessment: Defer to OT evaluation           Lower Extremity Assessment: Generalized weakness;RLE deficits/detail RLE Deficits / Details: Pt's son-in-law reports residual right-sided weakness  from prior strokes.     Cervical / Trunk Assessment: Kyphotic  Communication   Communication: HOH  Cognition Arousal/Alertness: Awake/alert Behavior During Therapy: Flat affect Overall Cognitive Status: Within Functional Limits for tasks assessed ("but a little groggy" per family)                      General Comments      Exercises        Assessment/Plan    PT Assessment  Patient needs continued PT services  PT Diagnosis Difficulty walking   PT Problem List Decreased strength;Decreased range of motion;Decreased activity tolerance;Decreased balance;Decreased mobility;Decreased knowledge of use of DME;Decreased safety awareness;Decreased knowledge of precautions  PT Treatment Interventions DME instruction;Gait training;Stair training;Functional mobility training;Therapeutic activities;Therapeutic exercise;Neuromuscular re-education;Patient/family education   PT Goals (Current goals can be found in the Care Plan section) Acute Rehab PT Goals Patient Stated Goal: Home today PT Goal Formulation: With patient/family Time For Goal Achievement: 06/15/15 Potential to Achieve Goals: Good    Frequency Min 3X/week   Barriers to discharge        Co-evaluation               End of Session Equipment Utilized During Treatment: Gait belt Activity Tolerance: Treatment limited secondary to medical complications (Comment) (Frequent need to evacuate bowels) Patient left: Other (comment);with family/visitor present;with call bell/phone within reach (In bathroom) Nurse Communication: Mobility status    Functional Assessment Tool Used: Clinical judgement Functional Limitation: Mobility: Walking and moving around Mobility: Walking and Moving Around Current Status [id number]): At least 1 percent but less than 20 percent impaired, limited or restricted Mobility: Walking and Moving Around Goal Status [phone]): At least 1 percent but less than 20 percent impaired, limited or restricted    Time: 1158-1222 PT Time Calculation (min) (ACUTE ONLY): 24 min   Charges:   PT Evaluation $PT Eval Moderate Complexity: 1 Procedure PT Treatments $Therapeutic Activity: 8-22 mins   PT G Codes:   PT G-Codes **NOT FOR INPATIENT CLASS** Functional Assessment Tool Used: Clinical judgement Functional Limitation: Mobility: Walking and moving around Mobility: Walking and Moving Around  Current Status [id number]): At least 1 percent but less than 20 percent impaired, limited or restricted Mobility: Walking and Moving Around Goal Status [phone]): At least 1 percent but less than 20 percent impaired, limited or restricted    Rolinda Roan 06/08/2015, 2:43 PM   Rolinda Roan, PT, DPT Acute Rehabilitation Services Pager: [phone]

## 2015-06-08 NOTE — Care Management Note (Signed)
Case Management Note  Patient Details  Name: Casey Collins MRN: [id number] Date of Birth: [birth year]  Subjective/Objective:                Presents with acute appendicitis , s/p appendectomy.    Action/Plan: Plan to discharge today with home health services (PT,OT).  Expected Discharge Date:   06/08/2015          Expected Discharge Plan:  [place]  In-House Referral:     Discharge planning Services  CM Consult  Post Acute Care Choice:    Choice offered to:  Patient  DME Arranged:    DME Agency:     HH Arranged:  PT, OT HH Agency:  [place]  Status of Service:  Completed, signed off  Medicare Important Message Given:    Date Medicare IM Given:    Medicare IM give by:    Date Additional Medicare IM Given:    Additional Medicare Important Message give by:     If discussed at [place] of Stay Meetings, dates discussed:    Additional Comments: CM received consult for home health services(PT,OT). Referral made with [facility] @ [phone]   after choice given and pt selected [facility]  to provide home health services.    Whitman Hero [place], [place] [phone] 06/08/2015, 4:24 PM

## 2015-06-08 NOTE — Discharge Summary (Signed)
[facility] Discharge Summary   Patient ID: JOEDY DEAHL MRN: [id number] DOB/AGE: 78/02/1935 78 y.o.  Admit date: 06/05/2015 Discharge date: 06/08/2015  Admitting Diagnosis: Acute appendicitis  Discharge Diagnosis Patient Active Problem List   Diagnosis Date Noted  . Appendicitis 06/06/2015  . Acute appendicitis 06/06/2015  . Diabetes ([place]) 12/11/2012  . Dysphasia pharyngeal 12/11/2012  . TIA (transient ischemic attack) 12/09/2012  . Renal insufficiency 12/09/2012  . Hyperkalemia 12/09/2012  . Acute kidney injury ([place]) 12/02/2012  . Difficulty swallowing 12/02/2012  . CVA (cerebral infarction) 12/01/2012  . H/O: CVA (cerebrovascular accident) 10/18/2011  . Chronic anticoagulation 10/18/2011  . Diverticulosis 10/18/2011  . Hx of adenomatous colonic polyps 10/18/2011  . Internal hemorrhoids 10/18/2011  . HTN (hypertension) 10/18/2011  . Osteoarthritis 10/18/2011    Consultants None  Imaging: No results found.  Procedures Dr. Rosendo Gros (06/06/15) - Laparoscopic Appendectomy  Hospital Course:  80 y/o M who presented to the ED with around 12 hours of abdominal pain. The pain started in his RLQ. He describes pain as coming in waves. He has a history of CVA, DM, HTN, dyslipidemia. He has not had fever/chills. Moving made the pain worse. No h/o abdominal surgery other than inguinal hernias. He has not tried other methods to make pain go away.   Workup showed acute appendicitis.  Patient was admitted and underwent procedure listed above.  Tolerated procedure well and was transferred to the floor.  Diet was advanced as tolerated.  His creatinine was mildly elevated on POD #2 and urine output was low, this has improved and he is ready to go home.  Physical therapy evaluation pending and if HH is needed we will order upon discharge.  On POD #2, the patient was voiding well, tolerating diet, ambulating well, pain well controlled, vital signs stable, incisions  c/d/i and felt stable for discharge home.  Patient will follow up in our office in 3 weeks and knows to call with questions or concerns.    Physical Exam: General:  Alert, NAD, pleasant, comfortable Abd:  Soft, ND, mild tenderness, incisions C/D/I    Medication List    TAKE these medications        aspirin EC 81 MG tablet  Take 81 mg by mouth daily.     citalopram 10 MG tablet  Commonly known as:  CELEXA  Take 10 mg by mouth daily.     clopidogrel 75 MG tablet  Commonly known as:  PLAVIX  Take 75 mg by mouth daily.     diphenhydrAMINE 50 MG capsule  Commonly known as:  BENADRYL  Take 50 mg by mouth at bedtime as needed for sleep.     LANTUS 100 UNIT/ML injection  Generic drug:  insulin glargine  Inject 10 Units into the skin 2 (two) times daily.     metoprolol tartrate 25 MG tablet  Commonly known as:  LOPRESSOR  Take 25 mg by mouth 2 (two) times daily.     NOVOLOG 100 UNIT/ML injection  Generic drug:  insulin aspart  Inject 5 Units into the skin daily.     oxyCODONE 5 MG immediate release tablet  Commonly known as:  Oxy IR/ROXICODONE  Take 0.5-2 tablets (2.5-10 mg total) by mouth every 6 (six) hours as needed for moderate pain.     ramipril 5 MG capsule  Commonly known as:  ALTACE  Take 5 mg by mouth daily.     simvastatin 40 MG tablet  Commonly known as:  ZOCOR  Take 40  mg by mouth at bedtime.     zolpidem 10 MG tablet  Commonly known as:  AMBIEN  Take 5-10 mg by mouth at bedtime.         Follow-up Information    Follow up with [facility] Go on 06/28/2015.   Specialty:  General Surgery   Why:  For post-operation check. Your appointment is at 10:15am, please arrive at least 61min before your appointment to complete your check in paperwork.  If you are unable to arrive 60min prior to your appointment time we may have to cancel or reschedule you.   Contact information:   [address] [place]  [place] [phone]      Signed: Vaughan Basta [facility] [phone]  06/08/2015, 9:55 AM

## 2015-06-08 NOTE — Progress Notes (Signed)
I was called by the nurse about the patient falling.  He did hit his head, but he tells me it doesn't hurt and that he didn't hit it hard.  He denies any headache, dizziness, SOB/CP, abdominal pain, N/V.  He has trouble with falling at home.  I discussed with the son in law at length that he seems fine, but sometimes in this instance we do obtain a CT of the head.  He wishes to just watch him.  I also discussed the possibility that if he goes home, having to come back to the hospital for change in behavior, disorientation, confusion, headache, changes in vision, hearing or balance.  He understands this.  The patients son in law would like to take him home today.  The patient had just been seen by PT and they released him for home with [facility] services to treat deconditioning.  Unfortunately he fell after that encounter.  The patient and his son understand the risk of going home, but do not want to go to a SNF/rehab.  He has trouble with his balance normally at home and the patient tells me he falls quite often.  The patients mental status is A&Ox3 and is able to follow all of my commands.  His sensation, motor, and circulation to all 5 extremities is intact.  His CN 2-12 are intact.  He has no lump, redness, bruising on his right posterior scalp and no pain.    At the patients and families wishes I will arrange [facility] services.  They have a walker that he will use at home and the therapist can continue to monitor over the next few weeks. I discussed this with Dr. Rosendo Gros as well.  Jomarie Longs, PA-C General Surgery [facility] [phone]

## 2015-06-08 NOTE — Progress Notes (Addendum)
Called to patient's room, upon entering room patient noted sitting in floor at the sink and 2 RN's and CNA standing with patient. Family member witnessed fall, states, "He was standing at the sink and just lost his balance. I didn't think he was going to need any help." Family member had previously stated to nurse tech that he would assist patient as needed so bed alarm was not necessary while he was present in the room. Patient states he hit his head on the side of the sink during call but states, "It doesn't hurt. I didn't hit it hard." No redness, lumps, or swelling noted. Patient assisted back in to bed by staff. Denies any pain or injury. Notified Jomarie Longs, PA-C.

## 2015-06-09 DIAGNOSIS — E119 Type 2 diabetes mellitus without complications: Secondary | ICD-10-CM | POA: Diagnosis not present

## 2015-06-09 DIAGNOSIS — K5790 Diverticulosis of intestine, part unspecified, without perforation or abscess without bleeding: Secondary | ICD-10-CM | POA: Diagnosis not present

## 2015-06-09 DIAGNOSIS — Z794 Long term (current) use of insulin: Secondary | ICD-10-CM | POA: Diagnosis not present

## 2015-06-09 DIAGNOSIS — M150 Primary generalized (osteo)arthritis: Secondary | ICD-10-CM | POA: Diagnosis not present

## 2015-06-09 DIAGNOSIS — E785 Hyperlipidemia, unspecified: Secondary | ICD-10-CM | POA: Diagnosis not present

## 2015-06-09 DIAGNOSIS — Z8673 Personal history of transient ischemic attack (TIA), and cerebral infarction without residual deficits: Secondary | ICD-10-CM | POA: Diagnosis not present

## 2015-06-09 DIAGNOSIS — Z48815 Encounter for surgical aftercare following surgery on the digestive system: Secondary | ICD-10-CM | POA: Diagnosis not present

## 2015-06-09 DIAGNOSIS — I10 Essential (primary) hypertension: Secondary | ICD-10-CM | POA: Diagnosis not present

## 2015-06-12 DIAGNOSIS — E119 Type 2 diabetes mellitus without complications: Secondary | ICD-10-CM | POA: Diagnosis not present

## 2015-06-12 DIAGNOSIS — I10 Essential (primary) hypertension: Secondary | ICD-10-CM | POA: Diagnosis not present

## 2015-06-12 DIAGNOSIS — Z8673 Personal history of transient ischemic attack (TIA), and cerebral infarction without residual deficits: Secondary | ICD-10-CM | POA: Diagnosis not present

## 2015-06-12 DIAGNOSIS — K5790 Diverticulosis of intestine, part unspecified, without perforation or abscess without bleeding: Secondary | ICD-10-CM | POA: Diagnosis not present

## 2015-06-12 DIAGNOSIS — M150 Primary generalized (osteo)arthritis: Secondary | ICD-10-CM | POA: Diagnosis not present

## 2015-06-12 DIAGNOSIS — Z48815 Encounter for surgical aftercare following surgery on the digestive system: Secondary | ICD-10-CM | POA: Diagnosis not present

## 2015-06-14 DIAGNOSIS — E119 Type 2 diabetes mellitus without complications: Secondary | ICD-10-CM | POA: Diagnosis not present

## 2015-06-14 DIAGNOSIS — I10 Essential (primary) hypertension: Secondary | ICD-10-CM | POA: Diagnosis not present

## 2015-06-14 DIAGNOSIS — K5790 Diverticulosis of intestine, part unspecified, without perforation or abscess without bleeding: Secondary | ICD-10-CM | POA: Diagnosis not present

## 2015-06-14 DIAGNOSIS — M150 Primary generalized (osteo)arthritis: Secondary | ICD-10-CM | POA: Diagnosis not present

## 2015-06-14 DIAGNOSIS — Z48815 Encounter for surgical aftercare following surgery on the digestive system: Secondary | ICD-10-CM | POA: Diagnosis not present

## 2015-06-14 DIAGNOSIS — Z8673 Personal history of transient ischemic attack (TIA), and cerebral infarction without residual deficits: Secondary | ICD-10-CM | POA: Diagnosis not present

## 2015-06-19 DIAGNOSIS — Z48815 Encounter for surgical aftercare following surgery on the digestive system: Secondary | ICD-10-CM | POA: Diagnosis not present

## 2015-06-19 DIAGNOSIS — K5790 Diverticulosis of intestine, part unspecified, without perforation or abscess without bleeding: Secondary | ICD-10-CM | POA: Diagnosis not present

## 2015-06-19 DIAGNOSIS — E119 Type 2 diabetes mellitus without complications: Secondary | ICD-10-CM | POA: Diagnosis not present

## 2015-06-19 DIAGNOSIS — I10 Essential (primary) hypertension: Secondary | ICD-10-CM | POA: Diagnosis not present

## 2015-06-19 DIAGNOSIS — M150 Primary generalized (osteo)arthritis: Secondary | ICD-10-CM | POA: Diagnosis not present

## 2015-06-19 DIAGNOSIS — Z8673 Personal history of transient ischemic attack (TIA), and cerebral infarction without residual deficits: Secondary | ICD-10-CM | POA: Diagnosis not present

## 2015-06-21 DIAGNOSIS — Z48815 Encounter for surgical aftercare following surgery on the digestive system: Secondary | ICD-10-CM | POA: Diagnosis not present

## 2015-06-21 DIAGNOSIS — K5790 Diverticulosis of intestine, part unspecified, without perforation or abscess without bleeding: Secondary | ICD-10-CM | POA: Diagnosis not present

## 2015-06-21 DIAGNOSIS — E119 Type 2 diabetes mellitus without complications: Secondary | ICD-10-CM | POA: Diagnosis not present

## 2015-06-21 DIAGNOSIS — I10 Essential (primary) hypertension: Secondary | ICD-10-CM | POA: Diagnosis not present

## 2015-06-21 DIAGNOSIS — Z8673 Personal history of transient ischemic attack (TIA), and cerebral infarction without residual deficits: Secondary | ICD-10-CM | POA: Diagnosis not present

## 2015-06-21 DIAGNOSIS — M150 Primary generalized (osteo)arthritis: Secondary | ICD-10-CM | POA: Diagnosis not present

## 2015-06-22 DIAGNOSIS — I10 Essential (primary) hypertension: Secondary | ICD-10-CM | POA: Diagnosis not present

## 2015-06-22 DIAGNOSIS — M150 Primary generalized (osteo)arthritis: Secondary | ICD-10-CM | POA: Diagnosis not present

## 2015-06-22 DIAGNOSIS — Z8673 Personal history of transient ischemic attack (TIA), and cerebral infarction without residual deficits: Secondary | ICD-10-CM | POA: Diagnosis not present

## 2015-06-22 DIAGNOSIS — Z48815 Encounter for surgical aftercare following surgery on the digestive system: Secondary | ICD-10-CM | POA: Diagnosis not present

## 2015-06-22 DIAGNOSIS — E119 Type 2 diabetes mellitus without complications: Secondary | ICD-10-CM | POA: Diagnosis not present

## 2015-06-22 DIAGNOSIS — K5790 Diverticulosis of intestine, part unspecified, without perforation or abscess without bleeding: Secondary | ICD-10-CM | POA: Diagnosis not present

## 2015-06-26 DIAGNOSIS — E119 Type 2 diabetes mellitus without complications: Secondary | ICD-10-CM | POA: Diagnosis not present

## 2015-06-26 DIAGNOSIS — I10 Essential (primary) hypertension: Secondary | ICD-10-CM | POA: Diagnosis not present

## 2015-06-26 DIAGNOSIS — Z8673 Personal history of transient ischemic attack (TIA), and cerebral infarction without residual deficits: Secondary | ICD-10-CM | POA: Diagnosis not present

## 2015-06-26 DIAGNOSIS — M150 Primary generalized (osteo)arthritis: Secondary | ICD-10-CM | POA: Diagnosis not present

## 2015-06-26 DIAGNOSIS — Z48815 Encounter for surgical aftercare following surgery on the digestive system: Secondary | ICD-10-CM | POA: Diagnosis not present

## 2015-06-26 DIAGNOSIS — K5790 Diverticulosis of intestine, part unspecified, without perforation or abscess without bleeding: Secondary | ICD-10-CM | POA: Diagnosis not present

## 2015-07-03 DIAGNOSIS — I69959 Hemiplegia and hemiparesis following unspecified cerebrovascular disease affecting unspecified side: Secondary | ICD-10-CM | POA: Diagnosis not present

## 2015-07-03 DIAGNOSIS — E784 Other hyperlipidemia: Secondary | ICD-10-CM | POA: Diagnosis not present

## 2015-07-03 DIAGNOSIS — Z6828 Body mass index (BMI) 28.0-28.9, adult: Secondary | ICD-10-CM | POA: Diagnosis not present

## 2015-07-03 DIAGNOSIS — E1129 Type 2 diabetes mellitus with other diabetic kidney complication: Secondary | ICD-10-CM | POA: Diagnosis not present

## 2015-07-03 DIAGNOSIS — I10 Essential (primary) hypertension: Secondary | ICD-10-CM | POA: Diagnosis not present

## 2015-07-03 DIAGNOSIS — R5383 Other fatigue: Secondary | ICD-10-CM | POA: Diagnosis not present

## 2015-07-03 DIAGNOSIS — E1151 Type 2 diabetes mellitus with diabetic peripheral angiopathy without gangrene: Secondary | ICD-10-CM | POA: Diagnosis not present

## 2015-07-03 DIAGNOSIS — N182 Chronic kidney disease, stage 2 (mild): Secondary | ICD-10-CM | POA: Diagnosis not present

## 2015-07-03 DIAGNOSIS — F329 Major depressive disorder, single episode, unspecified: Secondary | ICD-10-CM | POA: Diagnosis not present

## 2015-09-06 ENCOUNTER — Ambulatory Visit (INDEPENDENT_AMBULATORY_CARE_PROVIDER_SITE_OTHER): Payer: Medicare Other | Admitting: Podiatry

## 2015-09-06 ENCOUNTER — Encounter: Payer: Self-pay | Admitting: Podiatry

## 2015-09-06 VITALS — BP 193/82 | HR 51 | Resp 14

## 2015-09-06 DIAGNOSIS — M79676 Pain in unspecified toe(s): Secondary | ICD-10-CM

## 2015-09-06 DIAGNOSIS — B351 Tinea unguium: Secondary | ICD-10-CM | POA: Diagnosis not present

## 2015-09-06 NOTE — Progress Notes (Signed)
   Subjective:    Patient ID: Casey Collins, male    DOB: 06-03-1935, 80 y.o.   MRN: [id number]  HPI    Review of Systems  All other systems reviewed and are negative.      Objective:   Physical Exam        Assessment & Plan:

## 2015-09-06 NOTE — Progress Notes (Signed)
Complaint:  Visit Type: Patient presents  to my office for  preventative foot care services. Complaint: Patient states" my nails have grown long and thick and become painful to walk and wear shoes" Patient has been diagnosed with DM with no foot complications. The patient presents for preventative foot care services. No changes to ROS  Podiatric Exam: Vascular: dorsalis pedis and posterior tibial pulses are palpable bilateral. Capillary return is immediate. Temperature gradient is WNL. Skin turgor WNL  Sensorium  Diminished Semmes Weinstein monofilament test. Normal tactile sensation bilaterally. Nail Exam: Pt has thick disfigured discolored nails with subungual debris noted bilateral entire nail hallux  Ulcer Exam: There is no evidence of ulcer or pre-ulcerative changes or infection. Orthopedic Exam: Muscle tone and strength are WNL. No limitations in general ROM. No crepitus or effusions noted. Foot type and digits show no abnormalities. Bony prominences are unremarkable. Skin: No Porokeratosis. No infection or ulcers  Diagnosis:  Onychomycosis, , Pain in right toe, pain in left toes  Treatment & Plan Procedures and Treatment: Consent by patient was obtained for treatment procedures. The patient understood the discussion of treatment and procedures well. All questions were answered thoroughly reviewed. Debridement of mycotic and hypertrophic toenails, 1 through 5 bilateral and clearing of subungual debris. No ulceration, no infection noted.  Return Visit-Office Procedure: Patient instructed to return to the office for a follow up visit prn for continued evaluation and treatment.    Gardiner Barefoot DPM

## 2015-11-02 DIAGNOSIS — Z23 Encounter for immunization: Secondary | ICD-10-CM | POA: Diagnosis not present

## 2015-11-02 DIAGNOSIS — E785 Hyperlipidemia, unspecified: Secondary | ICD-10-CM | POA: Diagnosis not present

## 2015-11-02 DIAGNOSIS — E1129 Type 2 diabetes mellitus with other diabetic kidney complication: Secondary | ICD-10-CM | POA: Diagnosis not present

## 2015-11-02 DIAGNOSIS — Z6829 Body mass index (BMI) 29.0-29.9, adult: Secondary | ICD-10-CM | POA: Diagnosis not present

## 2015-11-02 DIAGNOSIS — Z1389 Encounter for screening for other disorder: Secondary | ICD-10-CM | POA: Diagnosis not present

## 2015-11-02 DIAGNOSIS — R5383 Other fatigue: Secondary | ICD-10-CM | POA: Diagnosis not present

## 2015-11-02 DIAGNOSIS — F329 Major depressive disorder, single episode, unspecified: Secondary | ICD-10-CM | POA: Diagnosis not present

## 2015-11-02 DIAGNOSIS — I10 Essential (primary) hypertension: Secondary | ICD-10-CM | POA: Diagnosis not present

## 2015-11-02 DIAGNOSIS — I69959 Hemiplegia and hemiparesis following unspecified cerebrovascular disease affecting unspecified side: Secondary | ICD-10-CM | POA: Diagnosis not present

## 2015-11-02 DIAGNOSIS — E1151 Type 2 diabetes mellitus with diabetic peripheral angiopathy without gangrene: Secondary | ICD-10-CM | POA: Diagnosis not present

## 2015-11-02 DIAGNOSIS — N182 Chronic kidney disease, stage 2 (mild): Secondary | ICD-10-CM | POA: Diagnosis not present

## 2016-01-15 ENCOUNTER — Encounter (HOSPITAL_COMMUNITY): Payer: Self-pay | Admitting: Neurology

## 2016-01-15 ENCOUNTER — Emergency Department (HOSPITAL_COMMUNITY): Payer: Medicare Other

## 2016-01-15 ENCOUNTER — Emergency Department (HOSPITAL_COMMUNITY)
Admission: EM | Admit: 2016-01-15 | Discharge: 2016-01-15 | Disposition: A | Discharge status: Home / Self Care | Payer: Medicare Other | Attending: Emergency Medicine | Admitting: Emergency Medicine

## 2016-01-15 DIAGNOSIS — R930 Abnormal findings on diagnostic imaging of skull and head, not elsewhere classified: Secondary | ICD-10-CM | POA: Insufficient documentation

## 2016-01-15 DIAGNOSIS — Z794 Long term (current) use of insulin: Secondary | ICD-10-CM | POA: Insufficient documentation

## 2016-01-15 DIAGNOSIS — Y929 Unspecified place or not applicable: Secondary | ICD-10-CM | POA: Diagnosis not present

## 2016-01-15 DIAGNOSIS — I10 Essential (primary) hypertension: Secondary | ICD-10-CM | POA: Insufficient documentation

## 2016-01-15 DIAGNOSIS — W1830XA Fall on same level, unspecified, initial encounter: Secondary | ICD-10-CM | POA: Diagnosis not present

## 2016-01-15 DIAGNOSIS — Y999 Unspecified external cause status: Secondary | ICD-10-CM | POA: Diagnosis not present

## 2016-01-15 DIAGNOSIS — M542 Cervicalgia: Secondary | ICD-10-CM | POA: Diagnosis not present

## 2016-01-15 DIAGNOSIS — S79911A Unspecified injury of right hip, initial encounter: Secondary | ICD-10-CM | POA: Diagnosis not present

## 2016-01-15 DIAGNOSIS — Z7982 Long term (current) use of aspirin: Secondary | ICD-10-CM | POA: Insufficient documentation

## 2016-01-15 DIAGNOSIS — M25551 Pain in right hip: Secondary | ICD-10-CM | POA: Diagnosis not present

## 2016-01-15 DIAGNOSIS — Y9389 Activity, other specified: Secondary | ICD-10-CM | POA: Diagnosis not present

## 2016-01-15 DIAGNOSIS — S199XXA Unspecified injury of neck, initial encounter: Secondary | ICD-10-CM | POA: Diagnosis not present

## 2016-01-15 DIAGNOSIS — W19XXXA Unspecified fall, initial encounter: Secondary | ICD-10-CM

## 2016-01-15 DIAGNOSIS — E119 Type 2 diabetes mellitus without complications: Secondary | ICD-10-CM | POA: Diagnosis not present

## 2016-01-15 DIAGNOSIS — S0990XA Unspecified injury of head, initial encounter: Secondary | ICD-10-CM | POA: Diagnosis not present

## 2016-01-15 DIAGNOSIS — Z8673 Personal history of transient ischemic attack (TIA), and cerebral infarction without residual deficits: Secondary | ICD-10-CM | POA: Diagnosis not present

## 2016-01-15 MED ORDER — TRAMADOL HCL 50 MG PO TABS: 50.0000 mg | ORAL_TABLET | Freq: Four times a day (QID) | ORAL | 0 refills | Status: DC | PRN

## 2016-01-15 NOTE — ED Notes (Signed)
Pt is in stable condition upon d/c and is escorted from ED via wheelchair. 

## 2016-01-15 NOTE — ED Notes (Signed)
Pt placed in [place] c-collar.

## 2016-01-15 NOTE — ED Triage Notes (Addendum)
Pt reports fell a few days ago b/c he was going too fast trying to chase the dog and landed on right hip. C/o right hip pain and neck pain. Pt uses cane. Is a x 4.

## 2016-01-15 NOTE — ED Provider Notes (Signed)
[place] DEPT Provider Note   CSN: [id number] Arrival date & time: 01/15/16  1302  By signing my name below, I, Casey Collins, attest that this documentation has been prepared under the direction and in the presence of [employer], PA-C. Electronically Signed: Judithann Sauger, ED Scribe. 01/15/16. 3:57 PM.   History   Chief Complaint Chief Complaint  Patient presents with  . Fall    HPI Comments: Casey Collins is a 80 y.o. male with a hx of arthritis, DM, and hypertension who presents to the Emergency Department complaining of an acute exacerbation of his chronic right hip pain s/p fall that occurred 2 days ago. He reports associated mild left posterior neck pain. Pt ambulates with a cane at baseline and he reports that he has had increased difficulty ambulating due to the pain. He explains that he tried to grab a puppy when he fell on his right hip. He denies any head injuries or LOC. No alleviating factors noted. Pt states that he has tried Aleve and Codeine (half a tablet every 12 hours) with minimal relief. He has NKDA. He is currently on Plavix. He denies any fever, chills, nausea, vomiting, numbness/tingling, weakness, open wounds, or any other symptoms. Pt has an upcoming Ortho appointment in 4 days.    The history is provided by the patient. No language interpreter was used.    Past Medical History:  Diagnosis Date  . Arthritis   . Chronic right hip pain   . Colon polyp   . Depression   . Diverticulosis   . Hyperlipemia   . Hypertension   . Pneumonia X 2  . Stroke [facility]) ~ 2011-2016 X 4   "right side weak since; difficulty walking, speaking, read, eat" (06/06/2015)  . Type II diabetes mellitus [facility])     Patient Active Problem List   Diagnosis Date Noted  . Appendicitis 06/06/2015  . Acute appendicitis 06/06/2015  . Diabetes ([place]) 12/11/2012  . Dysphasia pharyngeal 12/11/2012  . TIA (transient ischemic attack) 12/09/2012  . Renal insufficiency  12/09/2012  . Hyperkalemia 12/09/2012  . Acute kidney injury ([place]) 12/02/2012  . Difficulty swallowing 12/02/2012  . CVA (cerebral infarction) 12/01/2012  . H/O: CVA (cerebrovascular accident) 10/18/2011  . Chronic anticoagulation 10/18/2011  . Diverticulosis 10/18/2011  . Hx of adenomatous colonic polyps 10/18/2011  . Internal hemorrhoids 10/18/2011  . HTN (hypertension) 10/18/2011  . Osteoarthritis 10/18/2011    Past Surgical History:  Procedure Laterality Date  . APPENDECTOMY  06/06/2015  . CATARACT EXTRACTION W/ INTRAOCULAR LENS  IMPLANT, BILATERAL Bilateral 2016  . INGUINAL HERNIA REPAIR Bilateral   . LAPAROSCOPIC APPENDECTOMY N/A 06/06/2015   Procedure: APPENDECTOMY LAPAROSCOPIC;  Surgeon: Ralene Ok, MD;  Location: [place];  Service: General;  Laterality: N/A;  . TONSILLECTOMY         Home Medications    Prior to Admission medications   Medication Sig Start Date End Date Taking? Authorizing Provider  aspirin EC 81 MG tablet Take 81 mg by mouth daily.    Historical Provider, MD  citalopram (CELEXA) 10 MG tablet Take 10 mg by mouth daily.  05/16/11   Historical Provider, MD  clopidogrel (PLAVIX) 75 MG tablet Take 75 mg by mouth daily.    Historical Provider, MD  diphenhydrAMINE (BENADRYL) 50 MG capsule Take 50 mg by mouth at bedtime as needed for sleep.    Historical Provider, MD  insulin aspart (NOVOLOG) 100 UNIT/ML injection Inject 5 Units into the skin daily.     Historical Provider,  MD  insulin glargine (LANTUS) 100 UNIT/ML injection Inject 10 Units into the skin 2 (two) times daily.     Historical Provider, MD  metoprolol tartrate (LOPRESSOR) 25 MG tablet Take 25 mg by mouth 2 (two) times daily.  04/20/11   Historical Provider, MD  oxyCODONE (OXY IR/ROXICODONE) 5 MG immediate release tablet Take 0.5-2 tablets (2.5-10 mg total) by mouth every 6 (six) hours as needed for moderate pain. 06/08/15   Nat Christen, PA-C  ramipril (ALTACE) 5 MG capsule Take 5 mg by mouth daily.   05/16/11   Historical Provider, MD  simvastatin (ZOCOR) 40 MG tablet Take 40 mg by mouth at bedtime.  05/16/11   Historical Provider, MD  zolpidem (AMBIEN) 10 MG tablet Take 5-10 mg by mouth at bedtime.  05/16/11   Historical Provider, MD    Family History Family History  Problem Relation Age of Onset  . Prostate cancer Brother   . Diabetes Daughter   . Colon cancer Neg Hx     Social History Social History  Substance Use Topics  . Smoking status: Never Smoker  . Smokeless tobacco: Never Used  . Alcohol use No     Allergies   Patient has no known allergies.   Review of Systems Review of Systems  Constitutional: Negative for chills and fever.  Gastrointestinal: Negative for nausea and vomiting.  Musculoskeletal: Positive for arthralgias and neck pain.  Skin: Negative for wound.  Neurological: Negative for weakness and numbness.     Physical Exam Updated Vital Signs BP 108/61 (BP Location: Right Arm)   Pulse (!) 46   Temp 97.6 F (36.4 C) (Oral)   Resp 16   SpO2 99%   Physical Exam  Constitutional: He is oriented to person, place, and time. He appears well-developed and well-nourished. No distress.  HENT:  Head: Normocephalic and atraumatic.  Eyes: Conjunctivae and EOM are normal.  Neck: Neck supple. No tracheal deviation present.  No midline tenderness. Tenderness along right trapezius. Decreased ROM of neck due to pain. 5/5 strength in upper extremities.  Cardiovascular: Normal rate.   Pulmonary/Chest: Effort normal. No respiratory distress.  Musculoskeletal: Normal range of motion.  Right hip: No obvious swelling or deformity. Tenderness to palpation over anterior hip joint. Antalgic gait which is baseline. N/V intact.   Neurological: He is alert and oriented to person, place, and time.  Skin: Skin is warm and dry.  Psychiatric: He has a normal mood and affect. His behavior is normal.  Nursing note and vitals reviewed.    ED Treatments / Results    DIAGNOSTIC STUDIES: Oxygen Saturation is 99% on RA, normal by my interpretation.    COORDINATION OF CARE: 3:55 PM- Pt advised of plan for treatment and pt agrees. Pt informed of his lab and x-ray results. Advised to stop taking ibuprofen and to use Tylenol for pain.    Labs (all labs ordered are listed, but only abnormal results are displayed) Labs Reviewed - No data to display  EKG  EKG Interpretation None       Radiology Ct Head Wo Contrast  Result Date: 01/15/2016 CLINICAL DATA:  Fall, on anti coagulation. EXAM: CT HEAD WITHOUT CONTRAST CT CERVICAL SPINE WITHOUT CONTRAST TECHNIQUE: Multidetector CT imaging of the head and cervical spine was performed following the standard protocol without intravenous contrast. Multiplanar CT image reconstructions of the cervical spine were also generated. COMPARISON:  Brain MRI 12/09/2012 Head CT 12/09/2012 FINDINGS: CT HEAD FINDINGS Brain: No mass lesion, intraparenchymal hemorrhage or extra-axial  collection. No evidence of acute cortical infarct. There is right frontal and posterior left temporal lobe encephalomalacia suggestive of prior infarcts. There is an old right caudate lacunar infarct and old left lenticular capsular lacunar infarct. There is periventricular hypoattenuation compatible with chronic microvascular disease. Vascular: Atherosclerotic calcification of the internal carotid arteries at the skullbase. Skull: Normal visualized skull base, calvarium and extracranial soft tissues. Sinuses/Orbits: There is near complete opacification the right maxillary sinus with large retention cyst. Moderate left frontal and ethmoidal mucosal thickening. The Normal orbits. CT CERVICAL SPINE FINDINGS Alignment: No static subluxation. Facets are aligned. Occipital condyles are normally positioned. Skull base and vertebrae: No acute fracture. Soft tissues and spinal canal: No prevertebral fluid or swelling. No visible canal hematoma. Disc levels: There is  moderate narrowing of central spinal canal at C6-C7 secondary to central disc osteophyte complex. There is multilevel facet hypertrophy bat, in conjunction with uncovertebral hypertrophy, causes moderate to severe foraminal stenosis at left C3-C4, bilateral C5-C6 and left C6-C7. Upper chest: No pneumothorax, pulmonary nodule or pleural effusion. Other: Normal visualized paraspinal cervical soft tissues. IMPRESSION: 1. No intracranial hemorrhage. 2. Multiple old infarcts and chronic microvascular ischemia. 3. No acute fracture or static subluxation of the cervical spine. 4. Moderate to severe multilevel foraminal stenosis and moderate C6-C7 central spinal canal stenosis. Electronically Signed   By: Ulyses Jarred M.D.   On: 01/15/2016 14:19   Ct Cervical Spine Wo Contrast  Result Date: 01/15/2016 CLINICAL DATA:  Fall, on anti coagulation. EXAM: CT HEAD WITHOUT CONTRAST CT CERVICAL SPINE WITHOUT CONTRAST TECHNIQUE: Multidetector CT imaging of the head and cervical spine was performed following the standard protocol without intravenous contrast. Multiplanar CT image reconstructions of the cervical spine were also generated. COMPARISON:  Brain MRI 12/09/2012 Head CT 12/09/2012 FINDINGS: CT HEAD FINDINGS Brain: No mass lesion, intraparenchymal hemorrhage or extra-axial collection. No evidence of acute cortical infarct. There is right frontal and posterior left temporal lobe encephalomalacia suggestive of prior infarcts. There is an old right caudate lacunar infarct and old left lenticular capsular lacunar infarct. There is periventricular hypoattenuation compatible with chronic microvascular disease. Vascular: Atherosclerotic calcification of the internal carotid arteries at the skullbase. Skull: Normal visualized skull base, calvarium and extracranial soft tissues. Sinuses/Orbits: There is near complete opacification the right maxillary sinus with large retention cyst. Moderate left frontal and ethmoidal mucosal  thickening. The Normal orbits. CT CERVICAL SPINE FINDINGS Alignment: No static subluxation. Facets are aligned. Occipital condyles are normally positioned. Skull base and vertebrae: No acute fracture. Soft tissues and spinal canal: No prevertebral fluid or swelling. No visible canal hematoma. Disc levels: There is moderate narrowing of central spinal canal at C6-C7 secondary to central disc osteophyte complex. There is multilevel facet hypertrophy bat, in conjunction with uncovertebral hypertrophy, causes moderate to severe foraminal stenosis at left C3-C4, bilateral C5-C6 and left C6-C7. Upper chest: No pneumothorax, pulmonary nodule or pleural effusion. Other: Normal visualized paraspinal cervical soft tissues. IMPRESSION: 1. No intracranial hemorrhage. 2. Multiple old infarcts and chronic microvascular ischemia. 3. No acute fracture or static subluxation of the cervical spine. 4. Moderate to severe multilevel foraminal stenosis and moderate C6-C7 central spinal canal stenosis. Electronically Signed   By: Ulyses Jarred M.D.   On: 01/15/2016 14:19   Dg Hip Unilat  With Pelvis 2-3 Views Right  Result Date: 01/15/2016 CLINICAL DATA:  [place] 3 weeks ago.  Pain. EXAM: DG HIP (WITH OR WITHOUT PELVIS) 2-3V RIGHT COMPARISON:  CT abdomen pelvis May 2017. FINDINGS: There is no  evidence of hip fracture or dislocation. Moderate joint space narrowing with subchondral cyst formation is seen in the RIGHT hip. This is confirmed on prior CT abdomen and pelvis from May 2017. Vascular calcification. Lumbar degenerative spine changes. IMPRESSION: No acute abnormality. Electronically Signed   By: Staci Righter M.D.   On: 01/15/2016 14:17    Procedures Procedures (including critical care time)  Medications Ordered in ED Medications - No data to display   Initial Impression / Assessment and Plan / ED Course  Janetta Hora, PA-C has reviewed the triage vital signs and the nursing notes.  Pertinent labs & imaging results  that were available during my care of the patient were reviewed by me and considered in my medical decision making (see chart for details).  Clinical Course    Patient CT of head and neck and X-Ray negative for obvious fracture or dislocation. Pain managed in ED. He has follow up with Orthopedics at the end of the week. Pain medicine given. Conservative therapy recommended and discussed. Patient will be dc home & is agreeable with above plan.  Final Clinical Impressions(s) / ED Diagnoses   Final diagnoses:  Fall, initial encounter  Neck pain  Right hip pain    New Prescriptions Discharge Medication List as of 01/15/2016  4:03 PM    START taking these medications   Details  traMADol (ULTRAM) 50 MG tablet Take 1 tablet (50 mg total) by mouth every 6 (six) hours as needed., Starting Mon 01/15/2016, Print        I personally performed the services described in this documentation, which was scribed in my presence. The recorded information has been reviewed and is accurate.    Recardo Evangelist, PA-C 01/22/16 1140    Leo Grosser, MD 01/24/16 [phone]

## 2016-01-19 DIAGNOSIS — M1611 Unilateral primary osteoarthritis, right hip: Secondary | ICD-10-CM | POA: Diagnosis not present

## 2016-01-19 DIAGNOSIS — M25551 Pain in right hip: Secondary | ICD-10-CM | POA: Diagnosis not present

## 2016-02-08 DIAGNOSIS — M1611 Unilateral primary osteoarthritis, right hip: Secondary | ICD-10-CM | POA: Diagnosis not present

## 2016-04-24 DIAGNOSIS — I10 Essential (primary) hypertension: Secondary | ICD-10-CM | POA: Diagnosis not present

## 2016-04-24 DIAGNOSIS — Z125 Encounter for screening for malignant neoplasm of prostate: Secondary | ICD-10-CM | POA: Diagnosis not present

## 2016-04-24 DIAGNOSIS — E1151 Type 2 diabetes mellitus with diabetic peripheral angiopathy without gangrene: Secondary | ICD-10-CM | POA: Diagnosis not present

## 2016-04-24 DIAGNOSIS — E784 Other hyperlipidemia: Secondary | ICD-10-CM | POA: Diagnosis not present

## 2016-05-01 DIAGNOSIS — I69959 Hemiplegia and hemiparesis following unspecified cerebrovascular disease affecting unspecified side: Secondary | ICD-10-CM | POA: Diagnosis not present

## 2016-05-01 DIAGNOSIS — R5383 Other fatigue: Secondary | ICD-10-CM | POA: Diagnosis not present

## 2016-05-01 DIAGNOSIS — M25551 Pain in right hip: Secondary | ICD-10-CM | POA: Diagnosis not present

## 2016-05-01 DIAGNOSIS — I10 Essential (primary) hypertension: Secondary | ICD-10-CM | POA: Diagnosis not present

## 2016-05-01 DIAGNOSIS — Z1389 Encounter for screening for other disorder: Secondary | ICD-10-CM | POA: Diagnosis not present

## 2016-05-01 DIAGNOSIS — Z0000 Encounter for general adult medical examination without abnormal findings: Secondary | ICD-10-CM | POA: Diagnosis not present

## 2016-05-01 DIAGNOSIS — Z6828 Body mass index (BMI) 28.0-28.9, adult: Secondary | ICD-10-CM | POA: Diagnosis not present

## 2016-05-01 DIAGNOSIS — E1151 Type 2 diabetes mellitus with diabetic peripheral angiopathy without gangrene: Secondary | ICD-10-CM | POA: Diagnosis not present

## 2016-05-01 DIAGNOSIS — F329 Major depressive disorder, single episode, unspecified: Secondary | ICD-10-CM | POA: Diagnosis not present

## 2016-05-01 DIAGNOSIS — E1129 Type 2 diabetes mellitus with other diabetic kidney complication: Secondary | ICD-10-CM | POA: Diagnosis not present

## 2016-05-01 DIAGNOSIS — E784 Other hyperlipidemia: Secondary | ICD-10-CM | POA: Diagnosis not present

## 2016-05-01 DIAGNOSIS — N182 Chronic kidney disease, stage 2 (mild): Secondary | ICD-10-CM | POA: Diagnosis not present

## 2016-07-18 DIAGNOSIS — M1611 Unilateral primary osteoarthritis, right hip: Secondary | ICD-10-CM | POA: Diagnosis not present

## 2016-08-21 DIAGNOSIS — R5383 Other fatigue: Secondary | ICD-10-CM | POA: Diagnosis not present

## 2016-08-21 DIAGNOSIS — E1151 Type 2 diabetes mellitus with diabetic peripheral angiopathy without gangrene: Secondary | ICD-10-CM | POA: Diagnosis not present

## 2016-08-21 DIAGNOSIS — Z6828 Body mass index (BMI) 28.0-28.9, adult: Secondary | ICD-10-CM | POA: Diagnosis not present

## 2016-08-21 DIAGNOSIS — I69959 Hemiplegia and hemiparesis following unspecified cerebrovascular disease affecting unspecified side: Secondary | ICD-10-CM | POA: Diagnosis not present

## 2016-08-21 DIAGNOSIS — E1129 Type 2 diabetes mellitus with other diabetic kidney complication: Secondary | ICD-10-CM | POA: Diagnosis not present

## 2016-08-21 DIAGNOSIS — E784 Other hyperlipidemia: Secondary | ICD-10-CM | POA: Diagnosis not present

## 2016-08-21 DIAGNOSIS — F329 Major depressive disorder, single episode, unspecified: Secondary | ICD-10-CM | POA: Diagnosis not present

## 2016-08-21 DIAGNOSIS — M25551 Pain in right hip: Secondary | ICD-10-CM | POA: Diagnosis not present

## 2016-08-21 DIAGNOSIS — I10 Essential (primary) hypertension: Secondary | ICD-10-CM | POA: Diagnosis not present

## 2016-08-21 DIAGNOSIS — N182 Chronic kidney disease, stage 2 (mild): Secondary | ICD-10-CM | POA: Diagnosis not present

## 2016-10-24 DIAGNOSIS — R32 Unspecified urinary incontinence: Secondary | ICD-10-CM | POA: Diagnosis not present

## 2016-10-24 DIAGNOSIS — Z6828 Body mass index (BMI) 28.0-28.9, adult: Secondary | ICD-10-CM | POA: Diagnosis not present

## 2016-10-24 DIAGNOSIS — M545 Low back pain: Secondary | ICD-10-CM | POA: Diagnosis not present

## 2017-03-17 DIAGNOSIS — I10 Essential (primary) hypertension: Secondary | ICD-10-CM | POA: Diagnosis not present

## 2017-03-17 DIAGNOSIS — N182 Chronic kidney disease, stage 2 (mild): Secondary | ICD-10-CM | POA: Diagnosis not present

## 2017-03-17 DIAGNOSIS — E1129 Type 2 diabetes mellitus with other diabetic kidney complication: Secondary | ICD-10-CM | POA: Diagnosis not present

## 2017-03-17 DIAGNOSIS — R5383 Other fatigue: Secondary | ICD-10-CM | POA: Diagnosis not present

## 2017-03-17 DIAGNOSIS — I69959 Hemiplegia and hemiparesis following unspecified cerebrovascular disease affecting unspecified side: Secondary | ICD-10-CM | POA: Diagnosis not present

## 2017-03-17 DIAGNOSIS — Z6829 Body mass index (BMI) 29.0-29.9, adult: Secondary | ICD-10-CM | POA: Diagnosis not present

## 2017-03-17 DIAGNOSIS — Z1389 Encounter for screening for other disorder: Secondary | ICD-10-CM | POA: Diagnosis not present

## 2017-03-17 DIAGNOSIS — E1151 Type 2 diabetes mellitus with diabetic peripheral angiopathy without gangrene: Secondary | ICD-10-CM | POA: Diagnosis not present

## 2017-03-17 DIAGNOSIS — F329 Major depressive disorder, single episode, unspecified: Secondary | ICD-10-CM | POA: Diagnosis not present

## 2017-03-17 DIAGNOSIS — E7849 Other hyperlipidemia: Secondary | ICD-10-CM | POA: Diagnosis not present

## 2017-03-17 DIAGNOSIS — M545 Low back pain: Secondary | ICD-10-CM | POA: Diagnosis not present

## 2017-03-17 DIAGNOSIS — M25551 Pain in right hip: Secondary | ICD-10-CM | POA: Diagnosis not present

## 2017-07-21 DIAGNOSIS — G4709 Other insomnia: Secondary | ICD-10-CM | POA: Diagnosis not present

## 2017-07-21 DIAGNOSIS — M545 Low back pain: Secondary | ICD-10-CM | POA: Diagnosis not present

## 2017-07-21 DIAGNOSIS — I69959 Hemiplegia and hemiparesis following unspecified cerebrovascular disease affecting unspecified side: Secondary | ICD-10-CM | POA: Diagnosis not present

## 2017-07-21 DIAGNOSIS — N182 Chronic kidney disease, stage 2 (mild): Secondary | ICD-10-CM | POA: Diagnosis not present

## 2017-07-21 DIAGNOSIS — R296 Repeated falls: Secondary | ICD-10-CM | POA: Diagnosis not present

## 2017-07-21 DIAGNOSIS — E1129 Type 2 diabetes mellitus with other diabetic kidney complication: Secondary | ICD-10-CM | POA: Diagnosis not present

## 2017-07-21 DIAGNOSIS — L600 Ingrowing nail: Secondary | ICD-10-CM | POA: Diagnosis not present

## 2017-07-21 DIAGNOSIS — M25551 Pain in right hip: Secondary | ICD-10-CM | POA: Diagnosis not present

## 2017-07-21 DIAGNOSIS — Z6829 Body mass index (BMI) 29.0-29.9, adult: Secondary | ICD-10-CM | POA: Diagnosis not present

## 2017-07-21 DIAGNOSIS — E1151 Type 2 diabetes mellitus with diabetic peripheral angiopathy without gangrene: Secondary | ICD-10-CM | POA: Diagnosis not present

## 2017-07-21 DIAGNOSIS — F3289 Other specified depressive episodes: Secondary | ICD-10-CM | POA: Diagnosis not present

## 2017-07-21 DIAGNOSIS — R32 Unspecified urinary incontinence: Secondary | ICD-10-CM | POA: Diagnosis not present

## 2017-10-12 ENCOUNTER — Emergency Department (HOSPITAL_COMMUNITY): Payer: Medicare Other | Admitting: Anesthesiology

## 2017-10-12 ENCOUNTER — Inpatient Hospital Stay (HOSPITAL_COMMUNITY)
Admission: EM | Admit: 2017-10-12 | Discharge: 2017-10-22 | DRG: 907 | Disposition: A | Discharge status: Rehabilitation Facility | Payer: Medicare Other | Attending: General Surgery | Admitting: General Surgery

## 2017-10-12 ENCOUNTER — Emergency Department (HOSPITAL_COMMUNITY): Payer: Medicare Other

## 2017-10-12 ENCOUNTER — Encounter (HOSPITAL_COMMUNITY): Admission: EM | Disposition: A | Discharge status: Rehabilitation Facility | Payer: Self-pay | Source: Home / Self Care

## 2017-10-12 ENCOUNTER — Encounter (HOSPITAL_COMMUNITY): Payer: Self-pay | Admitting: Anesthesiology

## 2017-10-12 DIAGNOSIS — Z23 Encounter for immunization: Secondary | ICD-10-CM | POA: Diagnosis not present

## 2017-10-12 DIAGNOSIS — R402132 Coma scale, eyes open, to sound, at arrival to emergency department: Secondary | ICD-10-CM | POA: Diagnosis present

## 2017-10-12 DIAGNOSIS — I10 Essential (primary) hypertension: Secondary | ICD-10-CM | POA: Diagnosis not present

## 2017-10-12 DIAGNOSIS — S143XXD Injury of brachial plexus, subsequent encounter: Secondary | ICD-10-CM | POA: Diagnosis not present

## 2017-10-12 DIAGNOSIS — I959 Hypotension, unspecified: Secondary | ICD-10-CM | POA: Diagnosis not present

## 2017-10-12 DIAGNOSIS — J9601 Acute respiratory failure with hypoxia: Secondary | ICD-10-CM | POA: Diagnosis present

## 2017-10-12 DIAGNOSIS — M792 Neuralgia and neuritis, unspecified: Secondary | ICD-10-CM

## 2017-10-12 DIAGNOSIS — I808 Phlebitis and thrombophlebitis of other sites: Secondary | ICD-10-CM | POA: Diagnosis present

## 2017-10-12 DIAGNOSIS — R1310 Dysphagia, unspecified: Secondary | ICD-10-CM | POA: Diagnosis not present

## 2017-10-12 DIAGNOSIS — R269 Unspecified abnormalities of gait and mobility: Secondary | ICD-10-CM | POA: Diagnosis not present

## 2017-10-12 DIAGNOSIS — E872 Acidosis: Secondary | ICD-10-CM | POA: Diagnosis present

## 2017-10-12 DIAGNOSIS — R402242 Coma scale, best verbal response, confused conversation, at arrival to emergency department: Secondary | ICD-10-CM | POA: Diagnosis present

## 2017-10-12 DIAGNOSIS — S41132A Puncture wound without foreign body of left upper arm, initial encounter: Secondary | ICD-10-CM | POA: Diagnosis present

## 2017-10-12 DIAGNOSIS — R0902 Hypoxemia: Secondary | ICD-10-CM | POA: Diagnosis not present

## 2017-10-12 DIAGNOSIS — S45112A Laceration of brachial artery, left side, initial encounter: Principal | ICD-10-CM | POA: Diagnosis present

## 2017-10-12 DIAGNOSIS — R062 Wheezing: Secondary | ICD-10-CM

## 2017-10-12 DIAGNOSIS — J9811 Atelectasis: Secondary | ICD-10-CM | POA: Diagnosis not present

## 2017-10-12 DIAGNOSIS — J9690 Respiratory failure, unspecified, unspecified whether with hypoxia or hypercapnia: Secondary | ICD-10-CM

## 2017-10-12 DIAGNOSIS — Z8673 Personal history of transient ischemic attack (TIA), and cerebral infarction without residual deficits: Secondary | ICD-10-CM | POA: Diagnosis not present

## 2017-10-12 DIAGNOSIS — R05 Cough: Secondary | ICD-10-CM

## 2017-10-12 DIAGNOSIS — R0989 Other specified symptoms and signs involving the circulatory and respiratory systems: Secondary | ICD-10-CM | POA: Diagnosis not present

## 2017-10-12 DIAGNOSIS — E119 Type 2 diabetes mellitus without complications: Secondary | ICD-10-CM

## 2017-10-12 DIAGNOSIS — M7989 Other specified soft tissue disorders: Secondary | ICD-10-CM | POA: Diagnosis not present

## 2017-10-12 DIAGNOSIS — R578 Other shock: Secondary | ICD-10-CM

## 2017-10-12 DIAGNOSIS — T794XXA Traumatic shock, initial encounter: Secondary | ICD-10-CM | POA: Diagnosis present

## 2017-10-12 DIAGNOSIS — J9600 Acute respiratory failure, unspecified whether with hypoxia or hypercapnia: Secondary | ICD-10-CM | POA: Diagnosis not present

## 2017-10-12 DIAGNOSIS — S45192A Other specified injury of brachial artery, left side, initial encounter: Secondary | ICD-10-CM | POA: Diagnosis not present

## 2017-10-12 DIAGNOSIS — D62 Acute posthemorrhagic anemia: Secondary | ICD-10-CM | POA: Diagnosis present

## 2017-10-12 DIAGNOSIS — E1165 Type 2 diabetes mellitus with hyperglycemia: Secondary | ICD-10-CM | POA: Diagnosis present

## 2017-10-12 DIAGNOSIS — S143XXS Injury of brachial plexus, sequela: Secondary | ICD-10-CM | POA: Diagnosis not present

## 2017-10-12 DIAGNOSIS — W3400XA Accidental discharge from unspecified firearms or gun, initial encounter: Secondary | ICD-10-CM | POA: Diagnosis not present

## 2017-10-12 DIAGNOSIS — R404 Transient alteration of awareness: Secondary | ICD-10-CM | POA: Diagnosis not present

## 2017-10-12 DIAGNOSIS — R001 Bradycardia, unspecified: Secondary | ICD-10-CM | POA: Diagnosis not present

## 2017-10-12 DIAGNOSIS — R402352 Coma scale, best motor response, localizes pain, at arrival to emergency department: Secondary | ICD-10-CM | POA: Diagnosis present

## 2017-10-12 DIAGNOSIS — R7309 Other abnormal glucose: Secondary | ICD-10-CM | POA: Diagnosis not present

## 2017-10-12 DIAGNOSIS — R159 Full incontinence of feces: Secondary | ICD-10-CM | POA: Diagnosis present

## 2017-10-12 DIAGNOSIS — Z7982 Long term (current) use of aspirin: Secondary | ICD-10-CM

## 2017-10-12 DIAGNOSIS — I69351 Hemiplegia and hemiparesis following cerebral infarction affecting right dominant side: Secondary | ICD-10-CM | POA: Diagnosis not present

## 2017-10-12 DIAGNOSIS — N390 Urinary tract infection, site not specified: Secondary | ICD-10-CM | POA: Diagnosis not present

## 2017-10-12 DIAGNOSIS — M79622 Pain in left upper arm: Secondary | ICD-10-CM | POA: Diagnosis not present

## 2017-10-12 DIAGNOSIS — R059 Cough, unspecified: Secondary | ICD-10-CM

## 2017-10-12 DIAGNOSIS — S41102A Unspecified open wound of left upper arm, initial encounter: Secondary | ICD-10-CM | POA: Diagnosis not present

## 2017-10-12 DIAGNOSIS — S21102A Unspecified open wound of left front wall of thorax without penetration into thoracic cavity, initial encounter: Secondary | ICD-10-CM | POA: Diagnosis not present

## 2017-10-12 DIAGNOSIS — B9620 Unspecified Escherichia coli [E. coli] as the cause of diseases classified elsewhere: Secondary | ICD-10-CM | POA: Diagnosis not present

## 2017-10-12 DIAGNOSIS — S4992XS Unspecified injury of left shoulder and upper arm, sequela: Secondary | ICD-10-CM | POA: Diagnosis not present

## 2017-10-12 DIAGNOSIS — I69320 Aphasia following cerebral infarction: Secondary | ICD-10-CM | POA: Diagnosis not present

## 2017-10-12 DIAGNOSIS — K5901 Slow transit constipation: Secondary | ICD-10-CM | POA: Diagnosis not present

## 2017-10-12 HISTORY — PX: ARTERY EXPLORATION: SHX5110

## 2017-10-12 HISTORY — PX: VEIN HARVEST: SHX6363

## 2017-10-12 LAB — MASSIVE TRANSFUSION PROTOCOL ORDER (BLOOD BANK NOTIFICATION)

## 2017-10-12 LAB — GLUCOSE, CAPILLARY
GLUCOSE-CAPILLARY: 110 mg/dL — AB (ref 70–99)
Glucose-Capillary: 118 mg/dL — ABNORMAL HIGH (ref 70–99)
Glucose-Capillary: 122 mg/dL — ABNORMAL HIGH (ref 70–99)
Glucose-Capillary: 153 mg/dL — ABNORMAL HIGH (ref 70–99)
Glucose-Capillary: 164 mg/dL — ABNORMAL HIGH (ref 70–99)

## 2017-10-12 LAB — URINALYSIS, ROUTINE W REFLEX MICROSCOPIC
Bilirubin Urine: NEGATIVE
GLUCOSE, UA: NEGATIVE mg/dL
KETONES UR: NEGATIVE mg/dL
Nitrite: NEGATIVE
PROTEIN: 100 mg/dL — AB
Specific Gravity, Urine: 1.018 (ref 1.005–1.030)
pH: 5 (ref 5.0–8.0)

## 2017-10-12 LAB — CBC
HCT: 38.3 % — ABNORMAL LOW (ref 39.0–52.0)
HEMATOCRIT: 39.9 % (ref 39.0–52.0)
Hemoglobin: 12.3 g/dL — ABNORMAL LOW (ref 13.0–17.0)
Hemoglobin: 12.9 g/dL — ABNORMAL LOW (ref 13.0–17.0)
MCH: 30.6 pg (ref 26.0–34.0)
MCH: 29.8 pg (ref 26.0–34.0)
MCHC: 30.8 g/dL (ref 30.0–36.0)
MCHC: 33.7 g/dL (ref 30.0–36.0)
MCV: 99.3 fL (ref 78.0–100.0)
MCV: 88.5 fL (ref 78.0–100.0)
Platelets: 226 10*3/uL (ref 150–400)
Platelets: 140 10*3/uL — ABNORMAL LOW (ref 150–400)
RBC: 4.02 MIL/uL — ABNORMAL LOW (ref 4.22–5.81)
RBC: 4.33 MIL/uL (ref 4.22–5.81)
RDW: 12.9 % (ref 11.5–15.5)
RDW: 14.3 % (ref 11.5–15.5)
WBC: 7.6 10*3/uL (ref 4.0–10.5)
WBC: 14.9 10*3/uL — AB (ref 4.0–10.5)

## 2017-10-12 LAB — I-STAT CHEM 8, ED
BUN: 31 mg/dL — ABNORMAL HIGH (ref 8–23)
CALCIUM ION: 1.1 mmol/L — AB (ref 1.15–1.40)
CREATININE: 2 mg/dL — AB (ref 0.61–1.24)
Chloride: 107 mmol/L (ref 98–111)
GLUCOSE: 161 mg/dL — AB (ref 70–99)
HCT: 36 % — ABNORMAL LOW (ref 39.0–52.0)
HEMOGLOBIN: 12.2 g/dL — AB (ref 13.0–17.0)
POTASSIUM: 3.7 mmol/L (ref 3.5–5.1)
Sodium: 139 mmol/L (ref 135–145)
TCO2: 21 mmol/L — ABNORMAL LOW (ref 22–32)

## 2017-10-12 LAB — POCT I-STAT 7, (LYTES, BLD GAS, ICA,H+H)
ACID-BASE DEFICIT: 6 mmol/L — AB (ref 0.0–2.0)
ACID-BASE DEFICIT: 4 mmol/L — AB (ref 0.0–2.0)
BICARBONATE: 20.4 mmol/L (ref 20.0–28.0)
Bicarbonate: 19.4 mmol/L — ABNORMAL LOW (ref 20.0–28.0)
CALCIUM ION: 0.96 mmol/L — AB (ref 1.15–1.40)
CALCIUM ION: 0.95 mmol/L — AB (ref 1.15–1.40)
HCT: 36 % — ABNORMAL LOW (ref 39.0–52.0)
HCT: 34 % — ABNORMAL LOW (ref 39.0–52.0)
Hemoglobin: 12.2 g/dL — ABNORMAL LOW (ref 13.0–17.0)
Hemoglobin: 11.6 g/dL — ABNORMAL LOW (ref 13.0–17.0)
O2 Saturation: 100 %
O2 Saturation: 100 %
PH ART: 7.264 — AB (ref 7.350–7.450)
PH ART: 7.411 (ref 7.350–7.450)
Potassium: 4.3 mmol/L (ref 3.5–5.1)
Potassium: 4.2 mmol/L (ref 3.5–5.1)
SODIUM: 142 mmol/L (ref 135–145)
SODIUM: 145 mmol/L (ref 135–145)
TCO2: 22 mmol/L (ref 22–32)
TCO2: 20 mmol/L — AB (ref 22–32)
pCO2 arterial: 45 mmHg (ref 32.0–48.0)
pCO2 arterial: 30.6 mmHg — ABNORMAL LOW (ref 32.0–48.0)
pO2, Arterial: 401 mmHg — ABNORMAL HIGH (ref 83.0–108.0)
pO2, Arterial: 173 mmHg — ABNORMAL HIGH (ref 83.0–108.0)

## 2017-10-12 LAB — BASIC METABOLIC PANEL
ANION GAP: 10 (ref 5–15)
BUN: 25 mg/dL — AB (ref 8–23)
CHLORIDE: 114 mmol/L — AB (ref 98–111)
CO2: 21 mmol/L — ABNORMAL LOW (ref 22–32)
Calcium: 7.2 mg/dL — ABNORMAL LOW (ref 8.9–10.3)
Creatinine, Ser: 1.36 mg/dL — ABNORMAL HIGH (ref 0.61–1.24)
GFR calc non Af Amer: 48 mL/min — ABNORMAL LOW (ref >60)
GFR, EST AFRICAN AMERICAN: 55 mL/min — AB (ref >60)
Glucose, Bld: 167 mg/dL — ABNORMAL HIGH (ref 70–99)
POTASSIUM: 3.9 mmol/L (ref 3.5–5.1)
SODIUM: 145 mmol/L (ref 135–145)

## 2017-10-12 LAB — DIC (DISSEMINATED INTRAVASCULAR COAGULATION) PANEL
APTT: 39 s — AB (ref 24–36)
D DIMER QUANT: 18.23 ug{FEU}/mL — AB (ref 0.00–0.50)
FIBRINOGEN: 190 mg/dL — AB (ref 210–475)
PLATELETS: 94 10*3/uL — AB (ref 150–400)
SMEAR REVIEW: NONE SEEN
SMEAR REVIEW: NONE SEEN

## 2017-10-12 LAB — COMPREHENSIVE METABOLIC PANEL
ALBUMIN: 3.6 g/dL (ref 3.5–5.0)
ALK PHOS: 56 U/L (ref 38–126)
ALT: 16 U/L (ref 0–44)
AST: 24 U/L (ref 15–41)
Anion gap: 12 (ref 5–15)
BILIRUBIN TOTAL: 0.8 mg/dL (ref 0.3–1.2)
BUN: 30 mg/dL — AB (ref 8–23)
CALCIUM: 8.6 mg/dL — AB (ref 8.9–10.3)
CO2: 20 mmol/L — ABNORMAL LOW (ref 22–32)
Chloride: 107 mmol/L (ref 98–111)
Creatinine, Ser: 1.9 mg/dL — ABNORMAL HIGH (ref 0.61–1.24)
GFR calc Af Amer: 37 mL/min — ABNORMAL LOW (ref >60)
GFR calc non Af Amer: 32 mL/min — ABNORMAL LOW (ref >60)
GLUCOSE: 168 mg/dL — AB (ref 70–99)
Potassium: 3.8 mmol/L (ref 3.5–5.1)
Sodium: 139 mmol/L (ref 135–145)
TOTAL PROTEIN: 6 g/dL — AB (ref 6.5–8.1)

## 2017-10-12 LAB — I-STAT CG4 LACTIC ACID, ED: LACTIC ACID, VENOUS: 3.54 mmol/L — AB (ref 0.5–1.9)

## 2017-10-12 LAB — DIC (DISSEMINATED INTRAVASCULAR COAGULATION)PANEL
D-Dimer, Quant: 5.45 ug/mL-FEU — ABNORMAL HIGH (ref 0.00–0.50)
Fibrinogen: 297 mg/dL (ref 210–475)
INR: 1.58
INR: 1.27
Platelets: 139 10*3/uL — ABNORMAL LOW (ref 150–400)
Prothrombin Time: 18.7 seconds — ABNORMAL HIGH (ref 11.4–15.2)
Prothrombin Time: 15.8 seconds — ABNORMAL HIGH (ref 11.4–15.2)
aPTT: 32 seconds (ref 24–36)

## 2017-10-12 LAB — PROTIME-INR
INR: 1.1
INR: 1.29
PROTHROMBIN TIME: 14.1 s (ref 11.4–15.2)
PROTHROMBIN TIME: 16 s — AB (ref 11.4–15.2)

## 2017-10-12 LAB — POCT I-STAT 4, (NA,K, GLUC, HGB,HCT)
GLUCOSE: 172 mg/dL — AB (ref 70–99)
HCT: 35 % — ABNORMAL LOW (ref 39.0–52.0)
Hemoglobin: 11.9 g/dL — ABNORMAL LOW (ref 13.0–17.0)
POTASSIUM: 4.3 mmol/L (ref 3.5–5.1)
Sodium: 143 mmol/L (ref 135–145)

## 2017-10-12 LAB — LACTIC ACID, PLASMA: Lactic Acid, Venous: 2.8 mmol/L (ref 0.5–1.9)

## 2017-10-12 LAB — POCT I-STAT 3, ART BLOOD GAS (G3+)
Acid-base deficit: 1 mmol/L (ref 0.0–2.0)
BICARBONATE: 22.4 mmol/L (ref 20.0–28.0)
O2 SAT: 100 %
Patient temperature: 97.4
TCO2: 23 mmol/L (ref 22–32)
pCO2 arterial: 32.8 mmHg (ref 32.0–48.0)
pH, Arterial: 7.439 (ref 7.350–7.450)
pO2, Arterial: 351 mmHg — ABNORMAL HIGH (ref 83.0–108.0)

## 2017-10-12 LAB — CDS SEROLOGY

## 2017-10-12 LAB — ABO/RH: ABO/RH(D): A POS

## 2017-10-12 LAB — MRSA PCR SCREENING: MRSA by PCR: NEGATIVE

## 2017-10-12 LAB — ETHANOL

## 2017-10-12 LAB — CBG MONITORING, ED: GLUCOSE-CAPILLARY: 155 mg/dL — AB (ref 70–99)

## 2017-10-12 SURGERY — EXPLORATION, ARTERY
Anesthesia: General | Site: Leg Upper | Laterality: Right

## 2017-10-12 MED ORDER — SODIUM BICARBONATE 8.4 % IV SOLN
INTRAVENOUS | Status: DC | PRN
  Administered 2017-10-12 (×2): 50 meq via INTRAVENOUS

## 2017-10-12 MED ORDER — PHENYLEPHRINE HCL-NACL 10-0.9 MG/250ML-% IV SOLN
0.0000 ug/min | INTRAVENOUS | Status: DC
  Administered 2017-10-12: 90 ug/min via INTRAVENOUS
  Administered 2017-10-12: 100 ug/min via INTRAVENOUS
  Administered 2017-10-12: 20 ug/min via INTRAVENOUS
  Filled 2017-10-12 (×3): qty 250

## 2017-10-12 MED ORDER — ONDANSETRON 4 MG PO TBDP: 4.0000 mg | ORAL_TABLET | Freq: Four times a day (QID) | ORAL | Status: DC | PRN

## 2017-10-12 MED ORDER — INFLUENZA VAC SPLIT HIGH-DOSE 0.5 ML IM SUSY
0.5000 mL | PREFILLED_SYRINGE | INTRAMUSCULAR | Status: AC
  Administered 2017-10-13: 0.5 mL via INTRAMUSCULAR
  Filled 2017-10-12: qty 0.5

## 2017-10-12 MED ORDER — PHENYLEPHRINE HCL-NACL 40-0.9 MG/250ML-% IV SOLN
0.0000 ug/min | INTRAVENOUS | Status: DC
  Administered 2017-10-12: 85 ug/min via INTRAVENOUS
  Administered 2017-10-12: 80 ug/min via INTRAVENOUS
  Administered 2017-10-13: 95 ug/min via INTRAVENOUS
  Filled 2017-10-12 (×3): qty 250

## 2017-10-12 MED ORDER — STERILE WATER FOR INJECTION IJ SOLN
INTRAMUSCULAR | Status: AC
  Filled 2017-10-12: qty 10

## 2017-10-12 MED ORDER — SODIUM CHLORIDE 0.9 % IV BOLUS: 1000.0000 mL | Freq: Once | INTRAVENOUS | Status: DC

## 2017-10-12 MED ORDER — ROCURONIUM BROMIDE 50 MG/5ML IV SOSY
PREFILLED_SYRINGE | INTRAVENOUS | Status: AC
  Filled 2017-10-12: qty 5

## 2017-10-12 MED ORDER — MIDAZOLAM HCL 5 MG/5ML IJ SOLN
INTRAMUSCULAR | Status: DC | PRN
  Administered 2017-10-12: 2 mg via INTRAVENOUS

## 2017-10-12 MED ORDER — FENTANYL BOLUS VIA INFUSION
25.0000 ug | INTRAVENOUS | Status: DC | PRN
  Administered 2017-10-12 (×3): 25 ug via INTRAVENOUS
  Filled 2017-10-12: qty 25

## 2017-10-12 MED ORDER — FENTANYL CITRATE (PF) 100 MCG/2ML IJ SOLN
INTRAMUSCULAR | Status: AC | PRN
  Administered 2017-10-12: 100 ug via INTRAVENOUS

## 2017-10-12 MED ORDER — FENTANYL 2500MCG IN NS 250ML (10MCG/ML) PREMIX INFUSION
25.0000 ug/h | INTRAVENOUS | Status: DC
  Administered 2017-10-12: 300 ug/h via INTRAVENOUS
  Administered 2017-10-12: 50 ug/h via INTRAVENOUS
  Administered 2017-10-12: 100 ug/h via INTRAVENOUS
  Administered 2017-10-12: 150 ug/h via INTRAVENOUS
  Administered 2017-10-13: 400 ug/h via INTRAVENOUS
  Filled 2017-10-12 (×3): qty 250

## 2017-10-12 MED ORDER — POTASSIUM CHLORIDE IN NACL 20-0.9 MEQ/L-% IV SOLN
INTRAVENOUS | Status: DC
  Administered 2017-10-12 – 2017-10-13 (×3): via INTRAVENOUS
  Administered 2017-10-13: 1000 mL via INTRAVENOUS
  Administered 2017-10-14 – 2017-10-18 (×8): via INTRAVENOUS
  Filled 2017-10-12 (×14): qty 1000

## 2017-10-12 MED ORDER — ARTIFICIAL TEARS OPHTHALMIC OINT
TOPICAL_OINTMENT | OPHTHALMIC | Status: DC | PRN
  Administered 2017-10-12: 1 via OPHTHALMIC

## 2017-10-12 MED ORDER — CEFAZOLIN SODIUM 1 G IJ SOLR
INTRAMUSCULAR | Status: AC
  Filled 2017-10-12: qty 20

## 2017-10-12 MED ORDER — PHENYLEPHRINE HCL 10 MG/ML IJ SOLN
INTRAMUSCULAR | Status: DC | PRN
  Administered 2017-10-12: 80 ug via INTRAVENOUS

## 2017-10-12 MED ORDER — CHLORHEXIDINE GLUCONATE 0.12% ORAL RINSE (MEDLINE KIT)
15.0000 mL | Freq: Two times a day (BID) | OROMUCOSAL | Status: DC
  Administered 2017-10-12 – 2017-10-17 (×12): 15 mL via OROMUCOSAL

## 2017-10-12 MED ORDER — ONDANSETRON HCL 4 MG/2ML IJ SOLN
4.0000 mg | Freq: Four times a day (QID) | INTRAMUSCULAR | Status: DC | PRN
  Administered 2017-10-12: 4 mg via INTRAVENOUS
  Filled 2017-10-12: qty 2

## 2017-10-12 MED ORDER — MIDAZOLAM HCL 2 MG/2ML IJ SOLN
1.0000 mg | INTRAMUSCULAR | Status: DC | PRN
  Administered 2017-10-12 – 2017-10-14 (×9): 1 mg via INTRAVENOUS
  Filled 2017-10-12 (×8): qty 2

## 2017-10-12 MED ORDER — INSULIN ASPART 100 UNIT/ML ~~LOC~~ SOLN
0.0000 [IU] | SUBCUTANEOUS | Status: DC
  Administered 2017-10-12 (×2): 3 [IU] via SUBCUTANEOUS
  Administered 2017-10-12 – 2017-10-13 (×3): 2 [IU] via SUBCUTANEOUS
  Administered 2017-10-13 (×2): 3 [IU] via SUBCUTANEOUS
  Administered 2017-10-14: 2 [IU] via SUBCUTANEOUS
  Administered 2017-10-14: 5 [IU] via SUBCUTANEOUS
  Administered 2017-10-14: 8 [IU] via SUBCUTANEOUS
  Administered 2017-10-14: 3 [IU] via SUBCUTANEOUS
  Administered 2017-10-14: 5 [IU] via SUBCUTANEOUS
  Administered 2017-10-14: 11 [IU] via SUBCUTANEOUS
  Administered 2017-10-14: 3 [IU] via SUBCUTANEOUS
  Administered 2017-10-15: 5 [IU] via SUBCUTANEOUS
  Administered 2017-10-15 (×2): 3 [IU] via SUBCUTANEOUS
  Administered 2017-10-15 – 2017-10-16 (×3): 5 [IU] via SUBCUTANEOUS
  Administered 2017-10-16: 8 [IU] via SUBCUTANEOUS
  Administered 2017-10-16: 2 [IU] via SUBCUTANEOUS
  Administered 2017-10-16: 3 [IU] via SUBCUTANEOUS
  Administered 2017-10-16: 8 [IU] via SUBCUTANEOUS
  Administered 2017-10-16: 5 [IU] via SUBCUTANEOUS
  Administered 2017-10-17 (×4): 2 [IU] via SUBCUTANEOUS
  Administered 2017-10-18 (×2): 3 [IU] via SUBCUTANEOUS
  Administered 2017-10-18: 2 [IU] via SUBCUTANEOUS
  Administered 2017-10-18: 3 [IU] via SUBCUTANEOUS
  Administered 2017-10-18 – 2017-10-19 (×3): 2 [IU] via SUBCUTANEOUS
  Administered 2017-10-19: 5 [IU] via SUBCUTANEOUS
  Administered 2017-10-19: 3 [IU] via SUBCUTANEOUS
  Administered 2017-10-19: 5 [IU] via SUBCUTANEOUS
  Administered 2017-10-19 – 2017-10-20 (×3): 3 [IU] via SUBCUTANEOUS
  Administered 2017-10-20 (×2): 2 [IU] via SUBCUTANEOUS
  Administered 2017-10-20: 3 [IU] via SUBCUTANEOUS
  Administered 2017-10-21: 5 [IU] via SUBCUTANEOUS
  Administered 2017-10-21: 2 [IU] via SUBCUTANEOUS
  Administered 2017-10-21 – 2017-10-22 (×3): 3 [IU] via SUBCUTANEOUS
  Administered 2017-10-22: 2 [IU] via SUBCUTANEOUS

## 2017-10-12 MED ORDER — SODIUM CHLORIDE 0.9 % IV SOLN
INTRAVENOUS | Status: AC | PRN
  Administered 2017-10-12: 2000 mL via INTRAVENOUS

## 2017-10-12 MED ORDER — ROCURONIUM BROMIDE 50 MG/5ML IV SOSY
PREFILLED_SYRINGE | INTRAVENOUS | Status: DC | PRN
  Administered 2017-10-12: 30 mg via INTRAVENOUS

## 2017-10-12 MED ORDER — FAMOTIDINE IN NACL 20-0.9 MG/50ML-% IV SOLN
20.0000 mg | INTRAVENOUS | Status: DC
  Administered 2017-10-13 – 2017-10-18 (×6): 20 mg via INTRAVENOUS
  Filled 2017-10-12 (×6): qty 50

## 2017-10-12 MED ORDER — SODIUM CHLORIDE 0.9 % IV SOLN
INTRAVENOUS | Status: DC
  Administered 2017-10-12 (×2): via INTRAVENOUS

## 2017-10-12 MED ORDER — SODIUM CHLORIDE 0.9 % IV SOLN
INTRAVENOUS | Status: AC
  Filled 2017-10-12: qty 1.2

## 2017-10-12 MED ORDER — SODIUM CHLORIDE 0.9% FLUSH
10.0000 mL | Freq: Two times a day (BID) | INTRAVENOUS | Status: DC
  Administered 2017-10-12 – 2017-10-13 (×4): 10 mL
  Administered 2017-10-14: 20 mL
  Administered 2017-10-14: 10 mL
  Administered 2017-10-15 (×2): 20 mL
  Administered 2017-10-16 (×2): 10 mL

## 2017-10-12 MED ORDER — FENTANYL CITRATE (PF) 100 MCG/2ML IJ SOLN
INTRAMUSCULAR | Status: AC
  Filled 2017-10-12: qty 2

## 2017-10-12 MED ORDER — ORAL CARE MOUTH RINSE
15.0000 mL | OROMUCOSAL | Status: DC
  Administered 2017-10-12 – 2017-10-16 (×41): 15 mL via OROMUCOSAL

## 2017-10-12 MED ORDER — MIDAZOLAM HCL 2 MG/2ML IJ SOLN
INTRAMUSCULAR | Status: AC
  Filled 2017-10-12: qty 2

## 2017-10-12 MED ORDER — MIDAZOLAM HCL 2 MG/2ML IJ SOLN
INTRAMUSCULAR | Status: AC
  Administered 2017-10-12: 1 mg via INTRAVENOUS
  Filled 2017-10-12: qty 4

## 2017-10-12 MED ORDER — CEFAZOLIN SODIUM-DEXTROSE 2-3 GM-%(50ML) IV SOLR
INTRAVENOUS | Status: DC | PRN
  Administered 2017-10-12: 2 g via INTRAVENOUS

## 2017-10-12 MED ORDER — LACTATED RINGERS IV SOLN
INTRAVENOUS | Status: DC | PRN
  Administered 2017-10-12: 04:00:00 via INTRAVENOUS

## 2017-10-12 MED ORDER — FENTANYL CITRATE (PF) 250 MCG/5ML IJ SOLN
INTRAMUSCULAR | Status: AC
  Filled 2017-10-12: qty 5

## 2017-10-12 MED ORDER — VECURONIUM BROMIDE 10 MG IV SOLR
INTRAVENOUS | Status: AC
  Filled 2017-10-12: qty 10

## 2017-10-12 MED ORDER — DOPAMINE-DEXTROSE 3.2-5 MG/ML-% IV SOLN
0.0000 ug/kg/min | INTRAVENOUS | Status: DC
  Administered 2017-10-12: 5 ug/kg/min via INTRAVENOUS
  Filled 2017-10-12: qty 250

## 2017-10-12 MED ORDER — CALCIUM GLUCONATE 10 % IV SOLN
INTRAVENOUS | Status: AC
  Filled 2017-10-12: qty 10

## 2017-10-12 MED ORDER — PROPOFOL 500 MG/50ML IV EMUL
INTRAVENOUS | Status: DC | PRN
  Administered 2017-10-12: 50 ug/kg/min via INTRAVENOUS

## 2017-10-12 MED ORDER — MIDAZOLAM HCL 2 MG/2ML IJ SOLN
1.0000 mg | INTRAMUSCULAR | Status: DC | PRN
  Filled 2017-10-12: qty 2

## 2017-10-12 MED ORDER — FENTANYL CITRATE (PF) 100 MCG/2ML IJ SOLN
INTRAMUSCULAR | Status: DC | PRN
  Administered 2017-10-12: 150 ug via INTRAVENOUS
  Administered 2017-10-12: 100 ug via INTRAVENOUS

## 2017-10-12 MED ORDER — ARTIFICIAL TEARS OPHTHALMIC OINT
TOPICAL_OINTMENT | OPHTHALMIC | Status: AC
  Filled 2017-10-12: qty 3.5

## 2017-10-12 MED ORDER — GLUCAGON HCL RDNA (DIAGNOSTIC) 1 MG IJ SOLR
INTRAMUSCULAR | Status: AC
  Filled 2017-10-12: qty 1

## 2017-10-12 MED ORDER — GLUCAGON HCL (RDNA) 1 MG IJ SOLR
INTRAMUSCULAR | Status: AC | PRN
  Administered 2017-10-12: 1 mg via INTRAVENOUS

## 2017-10-12 MED ORDER — EPHEDRINE 5 MG/ML INJ
INTRAVENOUS | Status: AC
  Filled 2017-10-12: qty 10

## 2017-10-12 MED ORDER — CHLORHEXIDINE GLUCONATE CLOTH 2 % EX PADS
6.0000 | MEDICATED_PAD | Freq: Every day | CUTANEOUS | Status: DC
  Administered 2017-10-13 – 2017-10-17 (×7): 6 via TOPICAL

## 2017-10-12 MED ORDER — ETOMIDATE 2 MG/ML IV SOLN
INTRAVENOUS | Status: AC | PRN
  Administered 2017-10-12: 20 mg via INTRAVENOUS

## 2017-10-12 MED ORDER — SODIUM CHLORIDE 0.9 % IV SOLN
INTRAVENOUS | Status: DC | PRN
  Administered 2017-10-12: 04:00:00

## 2017-10-12 MED ORDER — PHENYLEPHRINE 40 MCG/ML (10ML) SYRINGE FOR IV PUSH (FOR BLOOD PRESSURE SUPPORT)
PREFILLED_SYRINGE | INTRAVENOUS | Status: AC
  Filled 2017-10-12: qty 10

## 2017-10-12 MED ORDER — HYDRALAZINE HCL 20 MG/ML IJ SOLN: 10.0000 mg | INTRAMUSCULAR | Status: DC | PRN

## 2017-10-12 MED ORDER — SUCCINYLCHOLINE CHLORIDE 20 MG/ML IJ SOLN
INTRAMUSCULAR | Status: AC | PRN
  Administered 2017-10-12: 100 mg via INTRAVENOUS

## 2017-10-12 MED ORDER — SODIUM CHLORIDE 0.9 % IV SOLN
INTRAVENOUS | Status: DC | PRN
  Administered 2017-10-12: 25 ug/min via INTRAVENOUS

## 2017-10-12 MED ORDER — TRANEXAMIC ACID 1000 MG/10ML IV SOLN
1000.0000 mg | INTRAVENOUS | Status: AC
  Administered 2017-10-12: 1000 mg via INTRAVENOUS
  Filled 2017-10-12: qty 10

## 2017-10-12 MED ORDER — FENTANYL CITRATE (PF) 100 MCG/2ML IJ SOLN: 50.0000 ug | Freq: Once | INTRAMUSCULAR | Status: DC

## 2017-10-12 MED ORDER — SODIUM CHLORIDE 0.9% FLUSH: 10.0000 mL | INTRAVENOUS | Status: DC | PRN

## 2017-10-12 MED ORDER — SODIUM CHLORIDE 0.9 % IR SOLN
Status: DC | PRN
  Administered 2017-10-12: 1000 mL

## 2017-10-12 SURGICAL SUPPLY — 49 items
ADH SKN CLS APL DERMABOND .7 (GAUZE/BANDAGES/DRESSINGS) ×2
ADH SKN CLS LQ APL DERMABOND (GAUZE/BANDAGES/DRESSINGS) ×2
BANDAGE ACE 4X5 VEL STRL LF (GAUZE/BANDAGES/DRESSINGS) ×2 IMPLANT
BNDG CMPR 9X4 STRL LF SNTH (GAUZE/BANDAGES/DRESSINGS)
BNDG ESMARK 4X9 LF (GAUZE/BANDAGES/DRESSINGS) IMPLANT
BNDG GAUZE ELAST 4 BULKY (GAUZE/BANDAGES/DRESSINGS) ×2 IMPLANT
BRUSH SCRUB EZ PLAIN DRY (MISCELLANEOUS) ×8 IMPLANT
CANISTER SUCT 3000ML PPV (MISCELLANEOUS) ×4 IMPLANT
CATH EMB 3FR 80CM (CATHETERS) ×2 IMPLANT
CLIP LIGATING EXTRA MED SLVR (CLIP) ×8 IMPLANT
CLIP LIGATING EXTRA SM BLUE (MISCELLANEOUS) ×6 IMPLANT
CUFF TOURNIQUET SINGLE 18IN (TOURNIQUET CUFF) ×2 IMPLANT
CUFF TOURNIQUET SINGLE 24IN (TOURNIQUET CUFF) IMPLANT
CUFF TOURNIQUET SINGLE 34IN LL (TOURNIQUET CUFF) IMPLANT
DECANTER SPIKE VIAL GLASS SM (MISCELLANEOUS) ×4 IMPLANT
DERMABOND ADHESIVE PROPEN (GAUZE/BANDAGES/DRESSINGS) ×2
DERMABOND ADVANCED (GAUZE/BANDAGES/DRESSINGS) ×2
DERMABOND ADVANCED .7 DNX12 (GAUZE/BANDAGES/DRESSINGS) ×2 IMPLANT
DERMABOND ADVANCED .7 DNX6 (GAUZE/BANDAGES/DRESSINGS) IMPLANT
DRAPE ORTHO SPLIT ENLRG (DRAPES) ×4 IMPLANT
DRSG PAD ABDOMINAL 8X10 ST (GAUZE/BANDAGES/DRESSINGS) ×2 IMPLANT
ELECT REM PT RETURN 9FT ADLT (ELECTROSURGICAL) ×4
ELECTRODE REM PT RTRN 9FT ADLT (ELECTROSURGICAL) ×2 IMPLANT
GAUZE SPONGE 4X4 12PLY STRL (GAUZE/BANDAGES/DRESSINGS) ×4 IMPLANT
GLOVE BIO SURGEON STRL SZ 6.5 (GLOVE) ×1 IMPLANT
GLOVE BIO SURGEON STRL SZ7.5 (GLOVE) ×2 IMPLANT
GLOVE BIO SURGEONS STRL SZ 6.5 (GLOVE) ×1
GLOVE SS BIOGEL STRL SZ 7.5 (GLOVE) ×2 IMPLANT
GLOVE SUPERSENSE BIOGEL SZ 7.5 (GLOVE) ×2
GOWN STRL REUS W/ TWL LRG LVL3 (GOWN DISPOSABLE) ×6 IMPLANT
GOWN STRL REUS W/TWL LRG LVL3 (GOWN DISPOSABLE) ×12
KIT BASIN OR (CUSTOM PROCEDURE TRAY) ×4 IMPLANT
KIT TURNOVER KIT B (KITS) ×4 IMPLANT
NS IRRIG 1000ML POUR BTL (IV SOLUTION) ×4 IMPLANT
PACK CV ACCESS (CUSTOM PROCEDURE TRAY) ×4 IMPLANT
PAD ARMBOARD 7.5X6 YLW CONV (MISCELLANEOUS) ×8 IMPLANT
PAD CAST 4YDX4 CTTN HI CHSV (CAST SUPPLIES) IMPLANT
PADDING CAST COTTON 4X4 STRL (CAST SUPPLIES)
SPONGE LAP 18X18 X RAY DECT (DISPOSABLE) ×2 IMPLANT
STAPLER VISISTAT 35W (STAPLE) ×2 IMPLANT
SUT PROLENE 6 0 CC (SUTURE) ×12 IMPLANT
SUT VIC AB 2-0 CT1 27 (SUTURE) ×4
SUT VIC AB 2-0 CT1 36 (SUTURE) ×2 IMPLANT
SUT VIC AB 2-0 CT1 TAPERPNT 27 (SUTURE) IMPLANT
SUT VIC AB 3-0 SH 27 (SUTURE) ×8
SUT VIC AB 3-0 SH 27X BRD (SUTURE) ×2 IMPLANT
TOWEL GREEN STERILE (TOWEL DISPOSABLE) ×4 IMPLANT
UNDERPAD 30X30 (UNDERPADS AND DIAPERS) ×4 IMPLANT
WATER STERILE IRR 1000ML POUR (IV SOLUTION) ×4 IMPLANT
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
## 2017-10-12 NOTE — Anesthesia Preprocedure Evaluation (Addendum)
Anesthesia Evaluation  Patient identified by MRN, date of birth, ID band Patient awake    Reviewed: Allergy & Precautions, NPO status , Patient's Chart, lab work & pertinent test results  Airway Mallampati: Intubated       Dental   Pulmonary    Pulmonary exam normal        Cardiovascular Normal cardiovascular exam     Neuro/Psych    GI/Hepatic   Endo/Other    Renal/GU      Musculoskeletal   Abdominal   Peds  Hematology   Anesthesia Other Findings   Reproductive/Obstetrics                            Anesthesia Physical Anesthesia Plan  ASA: III  Anesthesia Plan: General   Post-op Pain Management:    Induction: Intravenous, Rapid sequence and Cricoid pressure planned  PONV Risk Score and Plan: 2 and Ondansetron and Treatment may vary due to age or medical condition  Airway Management Planned: Oral ETT  Additional Equipment:   Intra-op Plan:   Post-operative Plan: Extubation in OR  Informed Consent: I have reviewed the patients History and Physical, chart, labs and discussed the procedure including the risks, benefits and alternatives for the proposed anesthesia with the patient or authorized representative who has indicated his/her understanding and acceptance.     Plan Discussed with: CRNA and Surgeon  Anesthesia Plan Comments:         Anesthesia Quick Evaluation

## 2017-10-12 NOTE — ED Notes (Signed)
First bag of plasma started.

## 2017-10-12 NOTE — Progress Notes (Signed)
Spoke with Ramierez- Notified of patient vomiting, verbal order taken for OG tube insertion with low intermittent suction.   Informed of low BP--order for neo placed. Hr sustaining 130-140s. Will switch from dopamine to neo.

## 2017-10-12 NOTE — Progress Notes (Signed)
Patient ID: Casey Collins, male   DOB: Nov 28, 1935, 82 y.o.   MRN: [id number] I spoke with his daughter and son in law and updated them on his current condition and the plan of care.  Georganna Skeans, MD, MPH, FACS Trauma: [phone] General Surgery: [phone]
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
## 2017-10-12 NOTE — Progress Notes (Signed)
ABG results obtained after patient returning from OR and being placed on ventilator.  ABG obtained on ventilator settings of tidal volume of 600, respiratory rate of 18, PEEP of 5 and FIO2 of 100%.  Decreased FIO2 to 40% based on PaO2.  No other changes at this time.  Will continue to monitor.    Ref. Range 10/12/2017 08:16  Sample type Unknown ARTERIAL  pH, Arterial Latest Ref Range: 7.350 - 7.450  7.439  pCO2 arterial Latest Ref Range: 32.0 - 48.0 mmHg 32.8  pO2, Arterial Latest Ref Range: 83.0 - 108.0 mmHg 351.0 (H)  TCO2 Latest Ref Range: 22 - 32 mmol/L 23  Acid-base deficit Latest Ref Range: 0.0 - 2.0 mmol/L 1.0  Bicarbonate Latest Ref Range: 20.0 - 28.0 mmol/L 22.4  O2 Saturation Latest Units: % 100.0  Patient temperature Unknown 97.4 F  Collection site Unknown ARTERIAL LINE
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
## 2017-10-12 NOTE — ED Notes (Signed)
Family escorted to 4N waiting room at request of OR RN. 4N ICU nurse notified
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
## 2017-10-12 NOTE — ED Notes (Signed)
Tourniquet off, auto tourniquet applied.

## 2017-10-12 NOTE — Transfer of Care (Signed)
Immediate Anesthesia Transfer of Care Note  Patient: Casey Collins  Procedure(s) Performed: LEFT UPPER ARM EXPLORATION AND REPAIR OF BRACHIAL ARTEY USING RIGHT LEG SAPHENOUS VEIN (Left Arm Upper) SAPHENOUS VEIN HARVEST FROM RIGHT UPPER LEG (Right Leg Upper)  Patient Location: ICU  Anesthesia Type:General  Level of Consciousness: sedated and Patient remains intubated per anesthesia plan  Airway & Oxygen Therapy: Patient remains intubated per anesthesia plan and Patient placed on Ventilator (see vital sign flow sheet for setting)  Post-op Assessment: Report given to RN and Post -op Vital signs reviewed and stable  Post vital signs: Reviewed and stable  Last Vitals:  Vitals Value Taken Time  BP 164/90 10/12/2017  6:18 AM  Temp    Pulse 49 10/12/2017  6:22 AM  Resp 17 10/12/2017  6:22 AM  SpO2 100 % 10/12/2017  6:22 AM  Vitals shown include unvalidated device data.  Last Pain:  Vitals:   10/12/17 0230  TempSrc: Temporal         Complications: No apparent anesthesia complications

## 2017-10-12 NOTE — Procedures (Signed)
Central Venous Catheter Insertion Procedure Note Casey Collins [id number] 1936-01-12  Procedure: Insertion of Central Venous Catheter Indications: Drug and/or fluid administration  Procedure Details Consent: Unable to obtain consent because of emergent medical necessity. Time Out: Verified patient identification, verified procedure, site/side was marked, verified correct patient position, special equipment/implants available, medications/allergies/relevent history reviewed, required imaging and test results available.  Performed  Maximum sterile technique was used including antiseptics, cap, gloves, gown, hand hygiene, mask and sheet. Skin prep: Chlorhexidine; local anesthetic administered A antimicrobial bonded/coated single lumen Cordis catheter was placed in the left femoral vein due to multiple attempts, no other available access using the Seldinger technique.  Evaluation Blood flow good Complications: No apparent complications Patient did tolerate procedure well.   Zenovia Jarred 10/12/2017, 3:34 AM

## 2017-10-12 NOTE — ED Notes (Signed)
Dr Randal Buba informed of lactic acid results 3.54

## 2017-10-12 NOTE — Anesthesia Procedure Notes (Signed)
Arterial Line Insertion Start/End9/15/2019 3:40 AM, 10/12/2017 3:45 AM Performed by: Lillia Abed, MD, anesthesiologist  Preanesthetic checklist: patient identified, IV checked, risks and benefits discussed, surgical consent, monitors and equipment checked, pre-op evaluation, timeout performed and anesthesia consent Patient sedated Right, radial was placed Catheter size: 20 G Hand hygiene performed  and Seldinger technique used  Attempts: 1 Procedure performed without using ultrasound guided technique. Following insertion, dressing applied and Biopatch. Post procedure assessment: normal  Patient tolerated the procedure well with no immediate complications.

## 2017-10-12 NOTE — Progress Notes (Signed)
Patient ID: Casey Collins, male   DOB: [birth year], 82 y.o.   MRN: [id number] Awake on vent Dressing dry and intact  2-3+ left radial pulse

## 2017-10-13 ENCOUNTER — Inpatient Hospital Stay (HOSPITAL_COMMUNITY): Payer: Medicare Other

## 2017-10-13 ENCOUNTER — Encounter (HOSPITAL_COMMUNITY): Payer: Self-pay | Admitting: Vascular Surgery

## 2017-10-13 ENCOUNTER — Inpatient Hospital Stay: Payer: Self-pay

## 2017-10-13 LAB — GLUCOSE, CAPILLARY
GLUCOSE-CAPILLARY: 110 mg/dL — AB (ref 70–99)
GLUCOSE-CAPILLARY: 121 mg/dL — AB (ref 70–99)
GLUCOSE-CAPILLARY: 154 mg/dL — AB (ref 70–99)
GLUCOSE-CAPILLARY: 193 mg/dL — AB (ref 70–99)
Glucose-Capillary: 149 mg/dL — ABNORMAL HIGH (ref 70–99)
Glucose-Capillary: 186 mg/dL — ABNORMAL HIGH (ref 70–99)

## 2017-10-13 LAB — POCT I-STAT 3, ART BLOOD GAS (G3+)
Acid-base deficit: 9 mmol/L — ABNORMAL HIGH (ref 0.0–2.0)
BICARBONATE: 17.5 mmol/L — AB (ref 20.0–28.0)
O2 SAT: 97 %
PO2 ART: 102 mmHg (ref 83.0–108.0)
Patient temperature: 100.1
TCO2: 19 mmol/L — ABNORMAL LOW (ref 22–32)
pCO2 arterial: 39.2 mmHg (ref 32.0–48.0)
pH, Arterial: 7.262 — ABNORMAL LOW (ref 7.350–7.450)

## 2017-10-13 LAB — PREPARE FRESH FROZEN PLASMA
Unit division: 0
Unit division: 0
Unit division: 0
Unit division: 0

## 2017-10-13 LAB — BPAM FFP
BLOOD PRODUCT EXPIRATION DATE: 201909172359
BLOOD PRODUCT EXPIRATION DATE: 201909172359
BLOOD PRODUCT EXPIRATION DATE: 201909192359
BLOOD PRODUCT EXPIRATION DATE: 201909202359
Blood Product Expiration Date: 201909172359
Blood Product Expiration Date: 201909172359
Blood Product Expiration Date: 201909192359
Blood Product Expiration Date: 201909202359
Blood Product Expiration Date: 201909202359
Blood Product Expiration Date: 201909202359
ISSUE DATE / TIME: 201909150220
ISSUE DATE / TIME: 201909150259
ISSUE DATE / TIME: 201909160943
ISSUE DATE / TIME: 201909160943
ISSUE DATE / TIME: 201909150220
ISSUE DATE / TIME: 201909150259
ISSUE DATE / TIME: 201909160943
ISSUE DATE / TIME: 201909160943
UNIT TYPE AND RH: 6200
UNIT TYPE AND RH: 6200
UNIT TYPE AND RH: 6200
UNIT TYPE AND RH: 6200
UNIT TYPE AND RH: 6200
Unit Type and Rh: 6200
Unit Type and Rh: 6200
Unit Type and Rh: 6200
Unit Type and Rh: 6200
Unit Type and Rh: 6200

## 2017-10-13 LAB — PREPARE PLATELET PHERESIS
UNIT DIVISION: 0
Unit division: 0
Unit division: 0

## 2017-10-13 LAB — PREPARE CRYOPRECIPITATE: Unit division: 0

## 2017-10-13 LAB — CBC
HCT: 31.5 % — ABNORMAL LOW (ref 39.0–52.0)
HEMOGLOBIN: 10.2 g/dL — AB (ref 13.0–17.0)
MCH: 30.2 pg (ref 26.0–34.0)
MCHC: 32.4 g/dL (ref 30.0–36.0)
MCV: 93.2 fL (ref 78.0–100.0)
PLATELETS: 148 10*3/uL — AB (ref 150–400)
RBC: 3.38 MIL/uL — ABNORMAL LOW (ref 4.22–5.81)
RDW: 15.5 % (ref 11.5–15.5)
WBC: 14.6 10*3/uL — AB (ref 4.0–10.5)

## 2017-10-13 LAB — BASIC METABOLIC PANEL
Anion gap: 8 (ref 5–15)
BUN: 25 mg/dL — ABNORMAL HIGH (ref 8–23)
CALCIUM: 6.6 mg/dL — AB (ref 8.9–10.3)
CO2: 19 mmol/L — ABNORMAL LOW (ref 22–32)
CREATININE: 1.68 mg/dL — AB (ref 0.61–1.24)
Chloride: 119 mmol/L — ABNORMAL HIGH (ref 98–111)
GFR, EST AFRICAN AMERICAN: 43 mL/min — AB (ref >60)
GFR, EST NON AFRICAN AMERICAN: 37 mL/min — AB (ref >60)
Glucose, Bld: 135 mg/dL — ABNORMAL HIGH (ref 70–99)
Potassium: 4.4 mmol/L (ref 3.5–5.1)
SODIUM: 146 mmol/L — AB (ref 135–145)

## 2017-10-13 LAB — BPAM PLATELET PHERESIS
BLOOD PRODUCT EXPIRATION DATE: 201909162359
BLOOD PRODUCT EXPIRATION DATE: 201909162359
BLOOD PRODUCT EXPIRATION DATE: 201909162359
ISSUE DATE / TIME: 201909150308
ISSUE DATE / TIME: 201909150416
UNIT TYPE AND RH: 5100
UNIT TYPE AND RH: 6200
Unit Type and Rh: 5100

## 2017-10-13 LAB — BPAM CRYOPRECIPITATE
Blood Product Expiration Date: 201909150945
ISSUE DATE / TIME: 201909150413
Unit Type and Rh: 6200

## 2017-10-13 LAB — PHOSPHORUS
Phosphorus: 2.3 mg/dL — ABNORMAL LOW (ref 2.5–4.6)
Phosphorus: 1.6 mg/dL — ABNORMAL LOW (ref 2.5–4.6)

## 2017-10-13 LAB — BLOOD PRODUCT ORDER (VERBAL) VERIFICATION

## 2017-10-13 LAB — MAGNESIUM
MAGNESIUM: 1.5 mg/dL — AB (ref 1.7–2.4)
MAGNESIUM: 1.5 mg/dL — AB (ref 1.7–2.4)

## 2017-10-13 MED ORDER — ENOXAPARIN SODIUM 30 MG/0.3ML ~~LOC~~ SOLN
30.0000 mg | SUBCUTANEOUS | Status: DC
  Administered 2017-10-13 – 2017-10-16 (×4): 30 mg via SUBCUTANEOUS
  Filled 2017-10-13 (×4): qty 0.3

## 2017-10-13 MED ORDER — SODIUM CHLORIDE 0.9 % IV SOLN
2.0000 g | Freq: Once | INTRAVENOUS | Status: AC
  Administered 2017-10-13: 2 g via INTRAVENOUS
  Filled 2017-10-13: qty 20

## 2017-10-13 MED ORDER — ALBUMIN HUMAN 5 % IV SOLN
25.0000 g | Freq: Once | INTRAVENOUS | Status: AC
  Administered 2017-10-13: 25 g via INTRAVENOUS
  Filled 2017-10-13: qty 250

## 2017-10-13 MED ORDER — SODIUM BICARBONATE 8.4 % IV SOLN
50.0000 meq | Freq: Once | INTRAVENOUS | Status: AC
  Administered 2017-10-13: 50 meq via INTRAVENOUS
  Filled 2017-10-13: qty 50

## 2017-10-13 MED ORDER — PRO-STAT SUGAR FREE PO LIQD
30.0000 mL | Freq: Two times a day (BID) | ORAL | Status: DC
  Administered 2017-10-13: 30 mL
  Filled 2017-10-13: qty 30

## 2017-10-13 MED ORDER — VITAL HIGH PROTEIN PO LIQD
1000.0000 mL | ORAL | Status: DC
  Filled 2017-10-13: qty 1000

## 2017-10-13 MED ORDER — VITAL AF 1.2 CAL PO LIQD
1000.0000 mL | ORAL | Status: DC
  Administered 2017-10-13 – 2017-10-14 (×2): 1000 mL

## 2017-10-13 MED ORDER — DEXMEDETOMIDINE HCL IN NACL 400 MCG/100ML IV SOLN
0.4000 ug/kg/h | INTRAVENOUS | Status: DC
  Administered 2017-10-13: 0.8 ug/kg/h via INTRAVENOUS
  Administered 2017-10-13 (×2): 0.4 ug/kg/h via INTRAVENOUS
  Administered 2017-10-14: 0.6 ug/kg/h via INTRAVENOUS
  Administered 2017-10-14: 1.1 ug/kg/h via INTRAVENOUS
  Administered 2017-10-14: 0.7 ug/kg/h via INTRAVENOUS
  Administered 2017-10-14: 0.5 ug/kg/h via INTRAVENOUS
  Administered 2017-10-14: 1 ug/kg/h via INTRAVENOUS
  Filled 2017-10-13 (×8): qty 100

## 2017-10-13 NOTE — Progress Notes (Signed)
Peripherally Inserted Central Catheter/Midline Placement  The IV Nurse has discussed with the patient and/or persons authorized to consent for the patient, the purpose of this procedure and the potential benefits and risks involved with this procedure.  The benefits include less needle sticks, lab draws from the catheter, and the patient may be discharged home with the catheter. Risks include, but not limited to, infection, bleeding, blood clot (thrombus formation), and puncture of an artery; nerve damage and irregular heartbeat and possibility to perform a PICC exchange if needed/ordered by physician.  Alternatives to this procedure were also discussed.  B[employer] PICC patient education guide, fact sheet on infection prevention and patient information card has been provided to patient /or left at bedside.    PICC/Midline Placement Documentation    Consent obtained via telephone with daughter,Linda Lombard .    Holley Bouche Renee 10/13/2017, 9:20 AM

## 2017-10-13 NOTE — Progress Notes (Signed)
CRITICAL VALUE ALERT  Critical Value:  Lactic acid 2.8  Date & Time Notied:  10/12/17 at 0730  Provider Notified: Rosendo Gros MD at bedside  Orders Received/Actions taken: No new orders at this time
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
## 2017-10-13 NOTE — Progress Notes (Signed)
RT note: attempted SBT on patient however patient went apneic and back up alarm came on.  Placed patient back on full support and tolerating well at this time.  Will continue to monitor.

## 2017-10-13 NOTE — Progress Notes (Signed)
ETT advanced 3cm per MD order.  Patient tolerated well.  ABG obtained per MD order on ventilator settings of tidal volume of 600, respiratory rate of 18, PEEP of 5 and FIO2 of 40%.  Will page MD with results.    Ref. Range 10/13/2017 08:54  Sample type Unknown ARTERIAL  pH, Arterial Latest Ref Range: 7.350 - 7.450  7.262 (L)  pCO2 arterial Latest Ref Range: 32.0 - 48.0 mmHg 39.2  pO2, Arterial Latest Ref Range: 83.0 - 108.0 mmHg 102.0  TCO2 Latest Ref Range: 22 - 32 mmol/L 19 (L)  Acid-base deficit Latest Ref Range: 0.0 - 2.0 mmol/L 9.0 (H)  Bicarbonate Latest Ref Range: 20.0 - 28.0 mmol/L 17.5 (L)  O2 Saturation Latest Units: % 97.0  Patient temperature Unknown 100.1 F  Collection site Unknown ARTERIAL LINE
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
## 2017-10-14 ENCOUNTER — Inpatient Hospital Stay (HOSPITAL_COMMUNITY): Payer: Medicare Other

## 2017-10-14 LAB — GLUCOSE, CAPILLARY
GLUCOSE-CAPILLARY: 222 mg/dL — AB (ref 70–99)
Glucose-Capillary: 209 mg/dL — ABNORMAL HIGH (ref 70–99)
Glucose-Capillary: 260 mg/dL — ABNORMAL HIGH (ref 70–99)
Glucose-Capillary: 325 mg/dL — ABNORMAL HIGH (ref 70–99)
Glucose-Capillary: 179 mg/dL — ABNORMAL HIGH (ref 70–99)
Glucose-Capillary: 135 mg/dL — ABNORMAL HIGH (ref 70–99)

## 2017-10-14 LAB — POCT I-STAT 3, ART BLOOD GAS (G3+)
Acid-base deficit: 6 mmol/L — ABNORMAL HIGH (ref 0.0–2.0)
BICARBONATE: 18.6 mmol/L — AB (ref 20.0–28.0)
O2 Saturation: 95 %
PO2 ART: 78 mmHg — AB (ref 83.0–108.0)
Patient temperature: 97.6
TCO2: 20 mmol/L — ABNORMAL LOW (ref 22–32)
pCO2 arterial: 33 mmHg (ref 32.0–48.0)
pH, Arterial: 7.355 (ref 7.350–7.450)

## 2017-10-14 LAB — BASIC METABOLIC PANEL
Anion gap: 6 (ref 5–15)
BUN: 25 mg/dL — ABNORMAL HIGH (ref 8–23)
CALCIUM: 7 mg/dL — AB (ref 8.9–10.3)
CO2: 20 mmol/L — ABNORMAL LOW (ref 22–32)
CREATININE: 1.48 mg/dL — AB (ref 0.61–1.24)
Chloride: 119 mmol/L — ABNORMAL HIGH (ref 98–111)
GFR calc Af Amer: 50 mL/min — ABNORMAL LOW (ref >60)
GFR, EST NON AFRICAN AMERICAN: 43 mL/min — AB (ref >60)
GLUCOSE: 250 mg/dL — AB (ref 70–99)
Potassium: 4.2 mmol/L (ref 3.5–5.1)
Sodium: 145 mmol/L (ref 135–145)

## 2017-10-14 LAB — CBC
HCT: 27.5 % — ABNORMAL LOW (ref 39.0–52.0)
Hemoglobin: 8.7 g/dL — ABNORMAL LOW (ref 13.0–17.0)
MCH: 30 pg (ref 26.0–34.0)
MCHC: 31.6 g/dL (ref 30.0–36.0)
MCV: 94.8 fL (ref 78.0–100.0)
PLATELETS: 100 10*3/uL — AB (ref 150–400)
RBC: 2.9 MIL/uL — ABNORMAL LOW (ref 4.22–5.81)
RDW: 15.1 % (ref 11.5–15.5)
WBC: 10.5 10*3/uL (ref 4.0–10.5)

## 2017-10-14 LAB — PHOSPHORUS
Phosphorus: 1.4 mg/dL — ABNORMAL LOW (ref 2.5–4.6)
Phosphorus: <1 mg/dL — CL (ref 2.5–4.6)

## 2017-10-14 LAB — MAGNESIUM
Magnesium: 1.7 mg/dL (ref 1.7–2.4)
Magnesium: 1.4 mg/dL — ABNORMAL LOW (ref 1.7–2.4)

## 2017-10-14 MED ORDER — FUROSEMIDE 10 MG/ML IJ SOLN
40.0000 mg | Freq: Once | INTRAMUSCULAR | Status: AC
  Administered 2017-10-14: 40 mg via INTRAVENOUS
  Filled 2017-10-14: qty 4

## 2017-10-14 MED ORDER — CLONAZEPAM 0.5 MG PO TBDP
0.5000 mg | ORAL_TABLET | Freq: Two times a day (BID) | ORAL | Status: DC
  Administered 2017-10-14 – 2017-10-15 (×4): 0.5 mg
  Filled 2017-10-14 (×4): qty 1

## 2017-10-14 MED ORDER — QUETIAPINE FUMARATE 200 MG PO TABS
200.0000 mg | ORAL_TABLET | Freq: Three times a day (TID) | ORAL | Status: DC
  Administered 2017-10-14 – 2017-10-15 (×4): 200 mg
  Filled 2017-10-14 (×4): qty 1

## 2017-10-14 MED ORDER — SODIUM PHOSPHATES 45 MMOLE/15ML IV SOLN: 10.0000 mmol | Freq: Once | INTRAVENOUS | Status: DC

## 2017-10-14 MED ORDER — SODIUM PHOSPHATES 45 MMOLE/15ML IV SOLN
10.0000 mmol | Freq: Once | INTRAVENOUS | Status: AC
  Administered 2017-10-14: 10 mmol via INTRAVENOUS
  Filled 2017-10-14: qty 3.33

## 2017-10-14 MED ORDER — MAGNESIUM SULFATE 4 GM/100ML IV SOLN
4.0000 g | Freq: Once | INTRAVENOUS | Status: AC
  Administered 2017-10-14: 4 g via INTRAVENOUS
  Filled 2017-10-14: qty 100

## 2017-10-14 NOTE — Progress Notes (Signed)
Inpatient Diabetes Program Recommendations  AACE/ADA: New Consensus Statement on Inpatient Glycemic Control (2015)  Target Ranges:  Prepandial:   less than 140 mg/dL      Peak postprandial:   less than 180 mg/dL (1-2 hours)      Critically ill patients:  140 - 180 mg/dL   Lab Results  Component Value Date   GLUCAP 260 (H) 10/14/2017    Review of Glycemic Control Results for OCTAVIANO, MUKAI (MRN [id number]) as of 10/14/2017 10:53  Ref. Range 10/13/2017 23:47 10/14/2017 03:44 10/14/2017 08:14  Glucose-Capillary Latest Ref Range: 70 - 99 mg/dL 193 (H) 209 (H) 260 (H)   Diabetes history: Type 2 DM Outpatient Diabetes medications: Basaglar 10 units BID, Novolog 5 units with breakfast Current orders for Inpatient glycemic control: Novolog 0-15 units Q4H Vital 1.2 @75  ml/hr  Inpatient Diabetes Program Recommendations:    No noted A1C, consider adding A1C?  Consider adding back a portion of basal insulin: Lantus 8 units QD.  Glucose trending up with start of tube feeds. Consider adding Novolog 3 units Q4H for tube feed coverage. Will need further adjustment once tube feeds are discontinued.   Thanks, Bronson Curb, MSN, RNC-OB Diabetes Coordinator [phone] (8a-5p)

## 2017-10-14 NOTE — Progress Notes (Signed)
RT note: patient placed on CPAP/PSV of 8/5 at 1220.  Currently tolerating well.  Will continue to monitor.

## 2017-10-14 NOTE — Progress Notes (Signed)
Pts A-line kinked, occluded after AM labs drawn.  A-line removed, bandage applied.  Will continue to monitor.

## 2017-10-14 NOTE — Progress Notes (Signed)
ABG obtained on patient prior to changing mode of ventilation to CPAP/PSV.  Results obtained on settings of tidal volume of 600, respiratory rate of 18, PEEP of 5, and FIO2 of 40%.    Ref. Range 10/14/2017 12:20  Sample type Unknown ARTERIAL  pH, Arterial Latest Ref Range: 7.350 - 7.450  7.355  pCO2 arterial Latest Ref Range: 32.0 - 48.0 mmHg 33.0  pO2, Arterial Latest Ref Range: 83.0 - 108.0 mmHg 78.0 (L)  TCO2 Latest Ref Range: 22 - 32 mmol/L 20 (L)  Acid-base deficit Latest Ref Range: 0.0 - 2.0 mmol/L 6.0 (H)  Bicarbonate Latest Ref Range: 20.0 - 28.0 mmol/L 18.6 (L)  O2 Saturation Latest Units: % 95.0  Patient temperature Unknown 97.6 F  Collection site Unknown RADIAL, ALLEN'S TEST ACCEPTABLE
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
## 2017-10-14 NOTE — Progress Notes (Signed)
Follow up - Trauma and Critical Care  Patient Details:    Casey Collins is an 82 y.o. male.  Lines/tubes : Airway 7.5 mm (Active)  Secured at (cm) 27 cm 10/14/2017  8:00 AM  Measured From Lips 10/14/2017  8:00 AM  Secured Location Center 10/14/2017  8:00 AM  Secured By [employer] 10/14/2017  8:00 AM  Tube Holder Repositioned Yes 10/14/2017  8:00 AM  Cuff Pressure (cm H2O) 26 cm H2O 10/14/2017  8:00 AM  Site Condition Dry 10/14/2017  8:00 AM     PICC Triple Lumen 61/95/09 PICC Right Basilic 44 cm 0 cm (Active)  Indication for Insertion or Continuance of Line Prolonged intravenous therapies 10/14/2017  8:00 AM  Exposed Catheter (cm) 0 cm 10/13/2017  9:00 AM  Site Assessment Clean;Dry;Intact 10/14/2017  8:00 AM  Lumen #1 Status Infusing 10/14/2017  8:00 AM  Lumen #2 Status Infusing 10/14/2017  8:00 AM  Lumen #3 Status Infusing 10/14/2017  8:00 AM  Dressing Type Transparent;Occlusive 10/14/2017  8:00 AM  Dressing Status Clean;Dry;Intact;Antimicrobial disc in place 10/14/2017  8:00 AM  Line Care Connections checked and tightened;Lumen 1 tubing changed;Lumen 2 tubing changed;Lumen 3 tubing changed 10/14/2017  8:00 AM  Dressing Change Due 10/20/17 10/14/2017  8:00 AM     NG/OG Tube Orogastric 16 Fr. Center mouth 59 cm (Active)  External Length of Tube (cm) - (if applicable) 58 cm 04/23/7122  8:00 AM  Site Assessment Clean;Dry;Intact 10/14/2017  8:00 AM  Ongoing Placement Verification Xray;No acute changes, not attributed to clinical condition;No change in respiratory status;No change in cm markings or external length of tube from initial placement 10/14/2017  8:00 AM  Status Infusing tube feed 10/14/2017  8:00 AM  Drainage Appearance Tan 10/13/2017  8:00 AM     Urethral Catheter margrita waltson Latex (Active)  Indication for Insertion or Continuance of Catheter Unstable critical patients (first 24-48 hours) 10/14/2017  7:44 AM  Site Assessment Clean;Intact 10/14/2017  7:44 AM  Catheter  Maintenance Bag below level of bladder;Catheter secured;Drainage bag/tubing not touching floor;Insertion date on drainage bag;No dependent loops;Seal intact;Bag emptied prior to transport 10/14/2017  7:44 AM  Collection Container Standard drainage bag 10/14/2017  7:44 AM  Securement Method Securing device (Describe) 10/14/2017  7:44 AM  Input (mL) 160 mL 10/12/2017  4:00 PM  Output (mL) 100 mL 10/14/2017  8:00 AM    Microbiology/Sepsis markers: Results for orders placed or performed during the hospital encounter of 10/12/17  MRSA PCR Screening     Status: None   Collection Time: 10/12/17  6:22 AM  Result Value Ref Range Status   MRSA by PCR NEGATIVE NEGATIVE Final    Comment:        The GeneXpert MRSA Assay (FDA approved for NASAL specimens only), is one component of a comprehensive MRSA colonization surveillance program. It is not intended to diagnose MRSA infection nor to guide or monitor treatment for MRSA infections. Performed at [facility], [place] [address], [place], [place] [address]     Anti-infectives:  Anti-infectives (From admission, onward)   None      Best Practice/Protocols:  VTE Prophylaxis: Lovenox (prophylaxtic dose) and Mechanical Intermittent Sedation Continous Sedation  Consults: Treatment Team:  Rosetta Posner, MD    Events:  Subjective:    Overnight Issues: Patient no longer on pressors.  No acute distress.  Gets agitated easily.  Needs Precedex sedation.  Objective:  Vital signs for last 24 hours: Temp:  [97.6 F (36.4 C)-99.9  F (37.7 C)] 97.6 F (36.4 C) (09/17 0800) Pulse Rate:  [50-116] 50 (09/17 0800) Resp:  [0-21] 20 (09/17 0800) BP: (76-181)/(40-99) 164/81 (09/17 0800) SpO2:  [92 %-100 %] 99 % (09/17 0800) Arterial Line BP: (79-154)/(35-90) 118/90 (09/17 0400) FiO2 (%):  [40 %] 40 % (09/17 0800) Weight:  [91.3 kg] 91.3 kg (09/17 0452)  Hemodynamic parameters for last 24 hours:    Intake/Output from previous day: 09/16  0701 - 09/17 0700 In: 4123.7 [I.V.:2706.3; NG/GT:1243.8; IV Piggyback:173.6] Out: 1060 [Urine:1060]  Intake/Output this shift: Total I/O In: 123.2 [I.V.:123.2] Out: 100 [Urine:100]  Vent settings for last 24 hours: Vent Mode: PRVC FiO2 (%):  [40 %] 40 % Set Rate:  [18 bmp] 18 bmp Vt Set:  [600 mL] 600 mL PEEP:  [5 cmH20] 5 cmH20 Plateau Pressure:  [15 SWF09-32 cmH20] 15 cmH20  Physical Exam:  General: no respiratory distress and gets agitated easily Neuro: nonfocal exam, confused, RASS 0, RASS -1 and agitated HEENT/Neck: no JVD and ETT WNL  Resp: clear to auscultation bilaterally CVS: regular rate and rhythm, S1, S2 normal, no murmur, click, rub or gallop GI: Soft, benign, tolerating tube feedings well. Extremities: Excellent left radial pulse  Results for orders placed or performed during the hospital encounter of 10/12/17 (from the past 24 hour(s))  Glucose, capillary     Status: Abnormal   Collection Time: 10/13/17 11:31 AM  Result Value Ref Range   Glucose-Capillary 149 (H) 70 - 99 mg/dL   Comment 1 Notify RN    Comment 2 Document in Chart   Glucose, capillary     Status: Abnormal   Collection Time: 10/13/17  3:59 PM  Result Value Ref Range   Glucose-Capillary 154 (H) 70 - 99 mg/dL   Comment 1 Notify RN    Comment 2 Document in Chart   Magnesium     Status: Abnormal   Collection Time: 10/13/17  6:44 PM  Result Value Ref Range   Magnesium 1.5 (L) 1.7 - 2.4 mg/dL  Phosphorus     Status: Abnormal   Collection Time: 10/13/17  6:44 PM  Result Value Ref Range   Phosphorus 1.6 (L) 2.5 - 4.6 mg/dL  Glucose, capillary     Status: Abnormal   Collection Time: 10/13/17  7:48 PM  Result Value Ref Range   Glucose-Capillary 186 (H) 70 - 99 mg/dL  Glucose, capillary     Status: Abnormal   Collection Time: 10/13/17 11:47 PM  Result Value Ref Range   Glucose-Capillary 193 (H) 70 - 99 mg/dL  Glucose, capillary     Status: Abnormal   Collection Time: 10/14/17  3:44 AM  Result  Value Ref Range   Glucose-Capillary 209 (H) 70 - 99 mg/dL  Magnesium     Status: None   Collection Time: 10/14/17  5:00 AM  Result Value Ref Range   Magnesium 1.7 1.7 - 2.4 mg/dL  Phosphorus     Status: Abnormal   Collection Time: 10/14/17  5:00 AM  Result Value Ref Range   Phosphorus 1.4 (L) 2.5 - 4.6 mg/dL  CBC     Status: Abnormal   Collection Time: 10/14/17  5:00 AM  Result Value Ref Range   WBC 10.5 4.0 - 10.5 K/uL   RBC 2.90 (L) 4.22 - 5.81 MIL/uL   Hemoglobin 8.7 (L) 13.0 - 17.0 g/dL   HCT 27.5 (L) 39.0 - 52.0 %   MCV 94.8 78.0 - 100.0 fL   MCH 30.0 26.0 - 34.0 pg  MCHC 31.6 30.0 - 36.0 g/dL   RDW 15.1 11.5 - 15.5 %   Platelets 100 (L) 150 - 400 K/uL  Basic metabolic panel     Status: Abnormal   Collection Time: 10/14/17  5:00 AM  Result Value Ref Range   Sodium 145 135 - 145 mmol/L   Potassium 4.2 3.5 - 5.1 mmol/L   Chloride 119 (H) 98 - 111 mmol/L   CO2 20 (L) 22 - 32 mmol/L   Glucose, Bld 250 (H) 70 - 99 mg/dL   BUN 25 (H) 8 - 23 mg/dL   Creatinine, Ser 1.48 (H) 0.61 - 1.24 mg/dL   Calcium 7.0 (L) 8.9 - 10.3 mg/dL   GFR calc non Af Amer 43 (L) >60 mL/min   GFR calc Af Amer 50 (L) >60 mL/min   Anion gap 6 5 - 15  Glucose, capillary     Status: Abnormal   Collection Time: 10/14/17  8:14 AM  Result Value Ref Range   Glucose-Capillary 260 (H) 70 - 99 mg/dL   Comment 1 Notify RN    Comment 2 Document in Chart      Assessment/Plan:   NEURO  Altered Mental Status:  agitation, delirium and sedation   Plan: Adding seroquel and Klonopin for sedation enterally.  PULM  NO specific issues currently.  Had been metabolically acidotic, getting ABG this AM   Plan: Wean ventilator with plans for extubation in the next 24-48 hours.  CARDIO  No specific issues   Plan: CPM  RENAL  Probably some acute injury secondary to hypovolemic shock.  Improving.   Plan: CPM  GI  No spcific issues   Plan: CPM with tube feedings until ready to extubate.  ID  No known infectious  source.   Plan: CPM  HEME  Anemia acute blood loss anemia) No blood necessary   Plan: CPM  ENDO No known issues   Plan: DM getting coverage.  Will add lantus now that TFs have been starte  Global Issues  Improving overall but agitated.  Will attempt to control agitatioin and wean ventilator.  No plans for extubation today.  Still acidotic last check and may need some more bicarbonate.    LOS: 2 days   Additional comments:I reviewed the patient's new clinical lab test results. cbc/bmet/abg  Critical Care Total Time*: 30 Minutes  Cobi Delph 10/14/2017  *Care during the described time interval was provided by me and/or other providers on the critical care team.  I have reviewed this patient's available data, including medical history, events of note, physical examination and test results as part of my evaluation.

## 2017-10-15 LAB — CBC WITH DIFFERENTIAL/PLATELET
BASOS ABS: 0 10*3/uL (ref 0.0–0.1)
BASOS PCT: 0 %
EOS ABS: 0.1 10*3/uL (ref 0.0–0.7)
Eosinophils Relative: 2 %
HCT: 24.1 % — ABNORMAL LOW (ref 39.0–52.0)
Hemoglobin: 7.6 g/dL — ABNORMAL LOW (ref 13.0–17.0)
Lymphocytes Relative: 17 %
Lymphs Abs: 0.6 10*3/uL — ABNORMAL LOW (ref 0.7–4.0)
MCH: 30.3 pg (ref 26.0–34.0)
MCHC: 31.5 g/dL (ref 30.0–36.0)
MCV: 96 fL (ref 78.0–100.0)
MONO ABS: 0.7 10*3/uL (ref 0.1–1.0)
MONOS PCT: 19 %
NEUTROS ABS: 2.2 10*3/uL (ref 1.7–7.7)
NEUTROS PCT: 62 %
PLATELETS: 100 10*3/uL — AB (ref 150–400)
RBC: 2.51 MIL/uL — ABNORMAL LOW (ref 4.22–5.81)
RDW: 15 % (ref 11.5–15.5)
WBC: 3.6 10*3/uL — AB (ref 4.0–10.5)

## 2017-10-15 LAB — MAGNESIUM: MAGNESIUM: 2.6 mg/dL — AB (ref 1.7–2.4)

## 2017-10-15 LAB — BASIC METABOLIC PANEL
Anion gap: 7 (ref 5–15)
BUN: 30 mg/dL — AB (ref 8–23)
CALCIUM: 6.9 mg/dL — AB (ref 8.9–10.3)
CO2: 19 mmol/L — ABNORMAL LOW (ref 22–32)
CREATININE: 1.37 mg/dL — AB (ref 0.61–1.24)
Chloride: 118 mmol/L — ABNORMAL HIGH (ref 98–111)
GFR, EST AFRICAN AMERICAN: 55 mL/min — AB (ref >60)
GFR, EST NON AFRICAN AMERICAN: 47 mL/min — AB (ref >60)
Glucose, Bld: 193 mg/dL — ABNORMAL HIGH (ref 70–99)
Potassium: 3.9 mmol/L (ref 3.5–5.1)
SODIUM: 144 mmol/L (ref 135–145)

## 2017-10-15 LAB — CBC
HCT: 27.6 % — ABNORMAL LOW (ref 39.0–52.0)
Hemoglobin: 8.7 g/dL — ABNORMAL LOW (ref 13.0–17.0)
MCH: 29.9 pg (ref 26.0–34.0)
MCHC: 31.5 g/dL (ref 30.0–36.0)
MCV: 94.8 fL (ref 78.0–100.0)
Platelets: 108 10*3/uL — ABNORMAL LOW (ref 150–400)
RBC: 2.91 MIL/uL — AB (ref 4.22–5.81)
RDW: 14.9 % (ref 11.5–15.5)
WBC: 4.8 10*3/uL (ref 4.0–10.5)

## 2017-10-15 LAB — GLUCOSE, CAPILLARY
GLUCOSE-CAPILLARY: 193 mg/dL — AB (ref 70–99)
Glucose-Capillary: 152 mg/dL — ABNORMAL HIGH (ref 70–99)
Glucose-Capillary: 211 mg/dL — ABNORMAL HIGH (ref 70–99)
Glucose-Capillary: 214 mg/dL — ABNORMAL HIGH (ref 70–99)
Glucose-Capillary: 221 mg/dL — ABNORMAL HIGH (ref 70–99)
Glucose-Capillary: 234 mg/dL — ABNORMAL HIGH (ref 70–99)

## 2017-10-15 LAB — PHOSPHORUS: PHOSPHORUS: 1.3 mg/dL — AB (ref 2.5–4.6)

## 2017-10-15 LAB — C DIFFICILE QUICK SCREEN W PCR REFLEX
C Diff antigen: NEGATIVE
C Diff interpretation: NOT DETECTED
C Diff toxin: NEGATIVE

## 2017-10-15 LAB — PREPARE RBC (CROSSMATCH)

## 2017-10-15 MED ORDER — FUROSEMIDE 10 MG/ML IJ SOLN
20.0000 mg | Freq: Once | INTRAMUSCULAR | Status: AC
  Administered 2017-10-15: 20 mg via INTRAVENOUS
  Filled 2017-10-15: qty 2

## 2017-10-15 MED ORDER — INSULIN GLARGINE 100 UNIT/ML ~~LOC~~ SOLN
10.0000 [IU] | Freq: Every day | SUBCUTANEOUS | Status: DC
  Administered 2017-10-15 – 2017-10-16 (×2): 10 [IU] via SUBCUTANEOUS
  Filled 2017-10-15 (×2): qty 0.1

## 2017-10-15 MED ORDER — POTASSIUM PHOSPHATES 15 MMOLE/5ML IV SOLN
20.0000 mmol | Freq: Once | INTRAVENOUS | Status: AC
  Administered 2017-10-15: 20 mmol via INTRAVENOUS
  Filled 2017-10-15: qty 6.67

## 2017-10-15 MED ORDER — QUETIAPINE FUMARATE 200 MG PO TABS
200.0000 mg | ORAL_TABLET | Freq: Two times a day (BID) | ORAL | Status: DC
  Administered 2017-10-15: 200 mg
  Filled 2017-10-15: qty 1

## 2017-10-15 NOTE — Progress Notes (Signed)
Patient ID: Casey Collins, male   DOB: [birth year], 82 y.o.   MRN: [id number] Awake on vent. 2-3+ left radial pulse.  Upper arm dressing dry and intact Hopeful continued weaning per trauma service

## 2017-10-15 NOTE — Progress Notes (Signed)
Follow up - Trauma and Critical Care  Patient Details:    Casey Collins is an 82 y.o. male.  Lines/tubes : Airway 7.5 mm (Active)  Secured at (cm) 24 cm 10/15/2017  8:03 AM  Measured From Lips 10/15/2017  8:03 AM  [place] 10/15/2017  8:03 AM  Secured By [employer] 10/15/2017  8:03 AM  Tube Holder Repositioned Yes 10/15/2017  8:03 AM  Cuff Pressure (cm H2O) 26 cm H2O 10/15/2017  8:03 AM  Site Condition Dry 10/15/2017  8:03 AM     PICC Triple Lumen 43/15/40 PICC Right Basilic 44 cm 0 cm (Active)  Indication for Insertion or Continuance of Line Prolonged intravenous therapies 10/15/2017  8:00 AM  Exposed Catheter (cm) 0 cm 10/13/2017  9:00 AM  Site Assessment Clean;Dry;Intact 10/15/2017  8:00 AM  Lumen #1 Status Infusing 10/14/2017  8:00 PM  Lumen #2 Status Infusing 10/14/2017  8:00 PM  Lumen #3 Status Infusing 10/14/2017  8:00 PM  Dressing Type Transparent;Occlusive 10/14/2017  8:00 PM  Dressing Status Clean;Dry;Intact;Antimicrobial disc in place 10/14/2017  8:00 PM  Line Care Connections checked and tightened;Lumen 1 tubing changed;Lumen 2 tubing changed;Lumen 3 tubing changed 10/14/2017  8:00 PM  Dressing Change Due 10/20/17 10/14/2017  8:00 PM     NG/OG Tube Orogastric 16 Fr. Center mouth 59 cm (Active)  External Length of Tube (cm) - (if applicable) 58 cm 0/86/7619  8:00 AM  Site Assessment Clean;Dry;Intact 10/15/2017  8:00 AM  Ongoing Placement Verification No acute changes, not attributed to clinical condition;No change in respiratory status;No change in cm markings or external length of tube from initial placement 10/15/2017  8:00 AM  Status Infusing tube feed 10/15/2017  8:00 AM  Drainage Appearance Tan 10/13/2017  8:00 AM  Intake (mL) 50 mL 10/14/2017  9:30 PM     Rectal Tube/Pouch (Active)  Output (mL) 75 mL 10/15/2017  8:00 AM     Urethral Catheter margrita wal[employer] Latex (Active)  Indication for Insertion or Continuance of Catheter Other (comment) 10/15/2017   7:46 AM  Site Assessment Clean;Intact 10/15/2017  7:46 AM  Catheter Maintenance Bag below level of bladder;Catheter secured;Drainage bag/tubing not touching floor;Insertion date on drainage bag;No dependent loops;Seal intact;Bag emptied prior to transport 10/15/2017  7:46 AM  Collection Container Standard drainage bag 10/15/2017  7:46 AM  Securement Method Securing device (Describe) 10/15/2017  7:46 AM  Urinary Catheter Interventions Unclamped 10/14/2017  8:00 PM  Input (mL) 160 mL 10/12/2017  4:00 PM  Output (mL) 175 mL 10/15/2017  9:00 AM    Microbiology/Sepsis markers: Results for orders placed or performed during the hospital encounter of 10/12/17  MRSA PCR Screening     Status: None   Collection Time: 10/12/17  6:22 AM  Result Value Ref Range Status   MRSA by PCR NEGATIVE NEGATIVE Final    Comment:        The GeneXpert MRSA Assay (FDA approved for NASAL specimens only), is one component of a comprehensive MRSA colonization surveillance program. It is not intended to diagnose MRSA infection nor to guide or monitor treatment for MRSA infections. Performed at [facility], [place] [address] [place], [place] [address]     Anti-infectives:  Anti-infectives (From admission, onward)   None      Best Practice/Protocols:  VTE Prophylaxis: Lovenox (prophylaxtic dose) and Mechanical GI Prophylaxis: Pepcid Sedations IV has been stopped, but the patient is still getting Seroquel and Klonopin enterally.  Consults: Treatment Team:  Rosetta Posner,  MD    Events:  Subjective:    Overnight Issues: No big issues.  Did not overbreath when attempt to wean this AM  Objective:  Vital signs for last 24 hours: Temp:  [98.6 F (37 C)-99.4 F (37.4 C)] 98.7 F (37.1 C) (09/18 0802) Pulse Rate:  [52-85] 56 (09/18 0900) Resp:  [15-22] 18 (09/18 0900) BP: (84-142)/(39-83) 126/62 (09/18 0900) SpO2:  [94 %-99 %] 97 % (09/18 0900) FiO2 (%):  [40 %] 40 % (09/18 0803) Weight:  [94 kg]  94 kg (09/18 0355)  Hemodynamic parameters for last 24 hours:    Intake/Output from previous day: 09/17 0701 - 09/18 0700 In: 4931.8 [I.V.:2931; NG/GT:1850; IV Piggyback:150.8] Out: 4925 [Urine:4525; Stool:400]  Intake/Output this shift: Total I/O In: 228.3 [I.V.:228.3] Out: 250 [Urine:175; Stool:75]  Vent settings for last 24 hours: Vent Mode: PRVC FiO2 (%):  [40 %] 40 % Set Rate:  [18 bmp] 18 bmp Vt Set:  [580 mL-600 mL] 600 mL PEEP:  [5 cmH20] 5 cmH20 Pressure Support:  [8 cmH20] 8 cmH20 Plateau Pressure:  [14 cmH20-16 cmH20] 16 cmH20  Physical Exam:  General: no respiratory distress and sedated currently Neuro: nonfocal exam and RASS -1 HEENT/Neck: no JVD and ETT WNL  Resp: clear to auscultation bilaterally CVS: regular rate and rhythm, S1, S2 normal, no murmur, click, rub or gallop and brady GI: Tolerating tube feedings, but large amount of diarrhea currently. Extremities: no edema, no erythema, pulses WNL and Great left radial pulse, some erythema around incisiions  Results for orders placed or performed during the hospital encounter of 10/12/17 (from the past 24 hour(s))  Glucose, capillary     Status: Abnormal   Collection Time: 10/14/17 12:08 PM  Result Value Ref Range   Glucose-Capillary 325 (H) 70 - 99 mg/dL   Comment 1 Notify RN    Comment 2 Document in Chart   I-STAT 3, arterial blood gas (G3+)     Status: Abnormal   Collection Time: 10/14/17 12:20 PM  Result Value Ref Range   pH, Arterial 7.355 7.350 - 7.450   pCO2 arterial 33.0 32.0 - 48.0 mmHg   pO2, Arterial 78.0 (L) 83.0 - 108.0 mmHg   Bicarbonate 18.6 (L) 20.0 - 28.0 mmol/L   TCO2 20 (L) 22 - 32 mmol/L   O2 Saturation 95.0 %   Acid-base deficit 6.0 (H) 0.0 - 2.0 mmol/L   Patient temperature 97.6 F    Collection site RADIAL, ALLEN'S TEST ACCEPTABLE    Drawn by RT    Sample type ARTERIAL   Glucose, capillary     Status: Abnormal   Collection Time: 10/14/17  3:49 PM  Result Value Ref Range    Glucose-Capillary 222 (H) 70 - 99 mg/dL   Comment 1 QC Due   Magnesium     Status: Abnormal   Collection Time: 10/14/17  4:09 PM  Result Value Ref Range   Magnesium 1.4 (L) 1.7 - 2.4 mg/dL  Phosphorus     Status: Abnormal   Collection Time: 10/14/17  4:09 PM  Result Value Ref Range   Phosphorus <1.0 (LL) 2.5 - 4.6 mg/dL  Glucose, capillary     Status: Abnormal   Collection Time: 10/14/17  7:54 PM  Result Value Ref Range   Glucose-Capillary 179 (H) 70 - 99 mg/dL  Glucose, capillary     Status: Abnormal   Collection Time: 10/14/17 11:25 PM  Result Value Ref Range   Glucose-Capillary 135 (H) 70 - 99 mg/dL  Glucose, capillary  Status: Abnormal   Collection Time: 10/15/17  3:51 AM  Result Value Ref Range   Glucose-Capillary 152 (H) 70 - 99 mg/dL  CBC with Differential/Platelet     Status: Abnormal   Collection Time: 10/15/17  4:00 AM  Result Value Ref Range   WBC 3.6 (L) 4.0 - 10.5 K/uL   RBC 2.51 (L) 4.22 - 5.81 MIL/uL   Hemoglobin 7.6 (L) 13.0 - 17.0 g/dL   HCT 24.1 (L) 39.0 - 52.0 %   MCV 96.0 78.0 - 100.0 fL   MCH 30.3 26.0 - 34.0 pg   MCHC 31.5 30.0 - 36.0 g/dL   RDW 15.0 11.5 - 15.5 %   Platelets 100 (L) 150 - 400 K/uL   Neutrophils Relative % 62 %   Lymphocytes Relative 17 %   Monocytes Relative 19 %   Eosinophils Relative 2 %   Basophils Relative 0 %   Neutro Abs 2.2 1.7 - 7.7 K/uL   Lymphs Abs 0.6 (L) 0.7 - 4.0 K/uL   Monocytes Absolute 0.7 0.1 - 1.0 K/uL   Eosinophils Absolute 0.1 0.0 - 0.7 K/uL   Basophils Absolute 0.0 0.0 - 0.1 K/uL   WBC Morphology DOHLE BODIES   Basic metabolic panel     Status: Abnormal   Collection Time: 10/15/17  4:00 AM  Result Value Ref Range   Sodium 144 135 - 145 mmol/L   Potassium 3.9 3.5 - 5.1 mmol/L   Chloride 118 (H) 98 - 111 mmol/L   CO2 19 (L) 22 - 32 mmol/L   Glucose, Bld 193 (H) 70 - 99 mg/dL   BUN 30 (H) 8 - 23 mg/dL   Creatinine, Ser 1.37 (H) 0.61 - 1.24 mg/dL   Calcium 6.9 (L) 8.9 - 10.3 mg/dL   GFR calc non Af  Amer 47 (L) >60 mL/min   GFR calc Af Amer 55 (L) >60 mL/min   Anion gap 7 5 - 15  Phosphorus     Status: Abnormal   Collection Time: 10/15/17  4:00 AM  Result Value Ref Range   Phosphorus 1.3 (L) 2.5 - 4.6 mg/dL  Magnesium     Status: Abnormal   Collection Time: 10/15/17  4:00 AM  Result Value Ref Range   Magnesium 2.6 (H) 1.7 - 2.4 mg/dL  Glucose, capillary     Status: Abnormal   Collection Time: 10/15/17  7:41 AM  Result Value Ref Range   Glucose-Capillary 221 (H) 70 - 99 mg/dL     Assessment/Plan:   NEURO  Altered Mental Status:  sedation   Plan: Hold on some of his enteral sedation and go to Klonopic and Seroquel to bid  PULM  No specific issues   Plan: CPM  CARDIO  Bradycardia (sinus)   Plan: No presenting an issues  RENAL  Urine output great, but renall funciton with BUN 30 and creatinine 1.39.  He did get Lasix yesterday.   Plan: Will give him another dose of Lasix after getting one unit of blood today.  GI  TOlerating tube feedings, but having explosive diarrhea.  Will Dheck C. diff and contineu tube feeding.s   Plan: C.diff titer.  COntinue tube feedings.  ID  NO known infectious sources   Plan: CPM  HEME  Anemia acute blood loss anemia)   Plan: Will transfuse one unit of PRBCs  ENDO No specific issues except for hyperglycemia   Plan: Add Lantus insulin  Global Issues  Not quite ready for extubation yet, but moving in the  right direction.  Glucose control okay.  Should be able to extubated in the next 24 hours or so    LOS: 3 days   Additional comments:I reviewed the patient's new clinical lab test results. cbc/bmet, I reviewed the patients new imaging test results. CXR and I have discussed and reviewed with family members patient's patient's daughter and son in law  Critical Care Total Time*: 30 Minutes  Fender Herder 10/15/2017  *Care during the described time interval was provided by me and/or other providers on the critical care team.  I have reviewed  this patient's available data, including medical history, events of note, physical examination and test results as part of my evaluation.

## 2017-10-15 NOTE — Progress Notes (Signed)
Patient currently still on a vent and not medically stable . Clinical Social Worker following patient for needs. CSW will continue to follow.    Rhea Pink, MSW,  [place]

## 2017-10-16 ENCOUNTER — Other Ambulatory Visit: Payer: Self-pay

## 2017-10-16 ENCOUNTER — Encounter (HOSPITAL_COMMUNITY): Payer: Self-pay

## 2017-10-16 LAB — BPAM RBC
BLOOD PRODUCT EXPIRATION DATE: 201910052359
BLOOD PRODUCT EXPIRATION DATE: 201910062359
BLOOD PRODUCT EXPIRATION DATE: 201910062359
BLOOD PRODUCT EXPIRATION DATE: 201910072359
BLOOD PRODUCT EXPIRATION DATE: 201910072359
BLOOD PRODUCT EXPIRATION DATE: 201910082359
BLOOD PRODUCT EXPIRATION DATE: 201910132359
BLOOD PRODUCT EXPIRATION DATE: 201910142359
BLOOD PRODUCT EXPIRATION DATE: 201910142359
BLOOD PRODUCT EXPIRATION DATE: 201910142359
BLOOD PRODUCT EXPIRATION DATE: 201910172359
BLOOD PRODUCT EXPIRATION DATE: 201910172359
BLOOD PRODUCT EXPIRATION DATE: 201910172359
BLOOD PRODUCT EXPIRATION DATE: 201910172359
Blood Product Expiration Date: 201909302359
Blood Product Expiration Date: 201910072359
Blood Product Expiration Date: 201910072359
Blood Product Expiration Date: 201910072359
Blood Product Expiration Date: 201910072359
Blood Product Expiration Date: 201910092359
ISSUE DATE / TIME: 201909150245
ISSUE DATE / TIME: 201909150359
ISSUE DATE / TIME: 201909160000
ISSUE DATE / TIME: 201909171433
ISSUE DATE / TIME: 201909171433
ISSUE DATE / TIME: 201909181053
ISSUE DATE / TIME: 201909150219
ISSUE DATE / TIME: 201909150219
ISSUE DATE / TIME: 201909150245
ISSUE DATE / TIME: 201909150257
ISSUE DATE / TIME: 201909150257
ISSUE DATE / TIME: 201909150257
ISSUE DATE / TIME: 201909150257
ISSUE DATE / TIME: 201909161143
ISSUE DATE / TIME: 201909170747
ISSUE DATE / TIME: 201909170906
ISSUE DATE / TIME: 201909171433
ISSUE DATE / TIME: 201909171433
ISSUE DATE / TIME: 201909171524
UNIT TYPE AND RH: 6200
UNIT TYPE AND RH: 6200
UNIT TYPE AND RH: 6200
UNIT TYPE AND RH: 6200
UNIT TYPE AND RH: 9500
UNIT TYPE AND RH: 9500
UNIT TYPE AND RH: 9500
UNIT TYPE AND RH: 9500
Unit Type and Rh: 6200
Unit Type and Rh: 6200
Unit Type and Rh: 6200
Unit Type and Rh: 6200
Unit Type and Rh: 6200
Unit Type and Rh: 6200
Unit Type and Rh: 9500
Unit Type and Rh: 9500
Unit Type and Rh: 9500
Unit Type and Rh: 9500
Unit Type and Rh: 9500
Unit Type and Rh: 9500

## 2017-10-16 LAB — BASIC METABOLIC PANEL
Anion gap: 7 (ref 5–15)
BUN: 27 mg/dL — ABNORMAL HIGH (ref 8–23)
CHLORIDE: 115 mmol/L — AB (ref 98–111)
CO2: 21 mmol/L — AB (ref 22–32)
Calcium: 7.3 mg/dL — ABNORMAL LOW (ref 8.9–10.3)
Creatinine, Ser: 1.31 mg/dL — ABNORMAL HIGH (ref 0.61–1.24)
GFR calc Af Amer: 58 mL/min — ABNORMAL LOW (ref >60)
GFR, EST NON AFRICAN AMERICAN: 50 mL/min — AB (ref >60)
Glucose, Bld: 294 mg/dL — ABNORMAL HIGH (ref 70–99)
Potassium: 4.1 mmol/L (ref 3.5–5.1)
Sodium: 143 mmol/L (ref 135–145)

## 2017-10-16 LAB — TYPE AND SCREEN
ABO/RH(D): A POS
Antibody Screen: NEGATIVE
UNIT DIVISION: 0
UNIT DIVISION: 0
UNIT DIVISION: 0
UNIT DIVISION: 0
UNIT DIVISION: 0
UNIT DIVISION: 0
UNIT DIVISION: 0
UNIT DIVISION: 0
UNIT DIVISION: 0
Unit division: 0
Unit division: 0
Unit division: 0
Unit division: 0
Unit division: 0
Unit division: 0
Unit division: 0
Unit division: 0
Unit division: 0
Unit division: 0
Unit division: 0

## 2017-10-16 LAB — CBC WITH DIFFERENTIAL/PLATELET
Abs Immature Granulocytes: 0 10*3/uL (ref 0.0–0.1)
Basophils Absolute: 0 10*3/uL (ref 0.0–0.1)
Basophils Relative: 0 %
EOS ABS: 0.2 10*3/uL (ref 0.0–0.7)
EOS PCT: 3 %
HEMATOCRIT: 28.3 % — AB (ref 39.0–52.0)
HEMOGLOBIN: 9.1 g/dL — AB (ref 13.0–17.0)
Immature Granulocytes: 1 %
LYMPHS ABS: 0.8 10*3/uL (ref 0.7–4.0)
Lymphocytes Relative: 12 %
MCH: 30 pg (ref 26.0–34.0)
MCHC: 32.2 g/dL (ref 30.0–36.0)
MCV: 93.4 fL (ref 78.0–100.0)
MONO ABS: 0.9 10*3/uL (ref 0.1–1.0)
Monocytes Relative: 15 %
Neutro Abs: 4.4 10*3/uL (ref 1.7–7.7)
Neutrophils Relative %: 69 %
Platelets: 131 10*3/uL — ABNORMAL LOW (ref 150–400)
RBC: 3.03 MIL/uL — AB (ref 4.22–5.81)
RDW: 15.9 % — AB (ref 11.5–15.5)
WBC: 6.4 10*3/uL (ref 4.0–10.5)

## 2017-10-16 LAB — GLUCOSE, CAPILLARY
GLUCOSE-CAPILLARY: 275 mg/dL — AB (ref 70–99)
GLUCOSE-CAPILLARY: 170 mg/dL — AB (ref 70–99)
Glucose-Capillary: 128 mg/dL — ABNORMAL HIGH (ref 70–99)
Glucose-Capillary: 139 mg/dL — ABNORMAL HIGH (ref 70–99)
Glucose-Capillary: 230 mg/dL — ABNORMAL HIGH (ref 70–99)
Glucose-Capillary: 287 mg/dL — ABNORMAL HIGH (ref 70–99)

## 2017-10-16 LAB — PHOSPHORUS: Phosphorus: 1.5 mg/dL — ABNORMAL LOW (ref 2.5–4.6)

## 2017-10-16 MED ORDER — MORPHINE SULFATE (PF) 2 MG/ML IV SOLN
2.0000 mg | INTRAVENOUS | Status: DC | PRN
  Administered 2017-10-16 – 2017-10-18 (×4): 2 mg via INTRAVENOUS
  Filled 2017-10-16 (×4): qty 1

## 2017-10-16 MED ORDER — ORAL CARE MOUTH RINSE
15.0000 mL | Freq: Two times a day (BID) | OROMUCOSAL | Status: DC
  Administered 2017-10-16 – 2017-10-22 (×13): 15 mL via OROMUCOSAL

## 2017-10-16 MED ORDER — OXYCODONE HCL 5 MG PO TABS: 5.0000 mg | ORAL_TABLET | Freq: Four times a day (QID) | ORAL | Status: DC | PRN

## 2017-10-16 NOTE — Progress Notes (Signed)
Trauma Critical Care Assessment and Plan:    Present on Admission: **None**    LOS: 4 days   Neuro: AMS: sedation Sedation off for breathing trial, likely extubation, PRN pain medications  Cardiac: Bradycardia -continue monitoring  Resp: No issues -trial today with potential extubation  GI: Tolerating tube feeds with diarrhea, c diff negative yesterday -continue tube feeds  ID: No active infections -continue high quality ICU cleansing techniques  Renal/FEN: hypophosphatemia Good UOP, creatine stable at 1.3 -recheck phosphorus  Other Injuries: Left brachial injury s/p bypass -good pulses -f/u vascular recommendations  Hematology: ABLA - stable today at 9.1 from 8.7 after transfusion yesterday  Endocrine: Hyperglycemia -continue SSI  Global Issues: Intubated for shock, now shock resolved, plan to extubate today or tomorrow  Critical Care Total Time*: 30 Minutes   Patient Details:    Casey Collins is an 82 y.o. male.  Lines/tubes : Airway 7.5 mm (Active)  Secured at (cm) 24 cm 10/16/2017  8:01 AM  Measured From Lips 10/16/2017  8:01 AM  Secured Location Center 10/16/2017  8:01 AM  Secured By [employer] 10/16/2017  8:01 AM  Tube Holder Repositioned Yes 10/16/2017  8:01 AM  Cuff Pressure (cm H2O) 26 cm H2O 10/16/2017  8:01 AM  Site Condition Dry 10/16/2017  8:01 AM     PICC Triple Lumen 55/37/48 PICC Right Basilic 44 cm 0 cm (Active)  Indication for Insertion or Continuance of Line Prolonged intravenous therapies 10/16/2017  8:00 AM  Exposed Catheter (cm) 0 cm 10/13/2017  9:00 AM  Site Assessment Clean;Dry;Intact 10/15/2017  8:00 PM  Lumen #1 Status Infusing 10/15/2017  8:00 PM  Lumen #2 Status Infusing 10/15/2017  8:00 PM  Lumen #3 Status Infusing 10/15/2017  8:00 PM  Dressing Type Transparent 10/15/2017  8:00 PM  Dressing Status Clean;Dry;Intact;Antimicrobial disc in place 10/15/2017  8:00 PM  Line Care Connections checked and  tightened;Lumen 1 tubing changed;Lumen 2 tubing changed;Lumen 3 tubing changed 10/15/2017  8:00 PM  Dressing Change Due 10/20/17 10/15/2017  8:00 PM     NG/OG Tube Orogastric 16 Fr. Center mouth 59 cm (Active)  External Length of Tube (cm) - (if applicable) 58 cm 2/70/7867  8:00 AM  Site Assessment Clean;Dry;Intact 10/15/2017  8:00 PM  Ongoing Placement Verification No acute changes, not attributed to clinical condition;No change in respiratory status;No change in cm markings or external length of tube from initial placement 10/15/2017  8:00 PM  Status Infusing tube feed 10/15/2017  8:00 PM  Drainage Appearance Tan 10/13/2017  8:00 AM  Intake (mL) 50 mL 10/14/2017  9:30 PM     Rectal Tube/Pouch (Active)  Output (mL) 75 mL 10/15/2017  8:00 AM     Urethral Catheter margrita waltson Latex (Active)  Indication for Insertion or Continuance of Catheter Other (comment) 10/16/2017  8:00 AM  Site Assessment Clean;Intact 10/15/2017  7:54 PM  Catheter Maintenance Bag below level of bladder;Catheter secured;Drainage bag/tubing not touching floor;Insertion date on drainage bag;No dependent loops;Seal intact 10/16/2017  8:00 AM  Collection Container Standard drainage bag 10/15/2017  7:54 PM  Securement Method Securing device (Describe) 10/15/2017  7:54 PM  Urinary Catheter Interventions Unclamped 10/15/2017  7:54 PM  Input (mL) 160 mL 10/12/2017  4:00 PM  Output (mL) 125 mL 10/16/2017  6:00 AM    Microbiology/Sepsis markers: Results for orders placed or performed during the hospital encounter of 10/12/17  MRSA PCR Screening     Status: None   Collection Time: 10/12/17  6:22 AM  Result Value Ref Range Status   MRSA by PCR NEGATIVE NEGATIVE Final    Comment:        The GeneXpert MRSA Assay (FDA approved for NASAL specimens only), is one component of a comprehensive MRSA colonization surveillance program. It is not intended to diagnose MRSA infection nor to guide or monitor treatment for MRSA  infections. Performed at [facility], [place] [address] [place], [place] [address]   C difficile quick scan w PCR reflex     Status: None   Collection Time: 10/15/17 10:40 AM  Result Value Ref Range Status   C Diff antigen NEGATIVE NEGATIVE Final   C Diff toxin NEGATIVE NEGATIVE Final   C Diff interpretation No C. difficile detected.  Final    Comment: Performed at [facility], [place] [address], [place], [place] [address]    Anti-infectives:  Anti-infectives (From admission, onward)   None      Consults: Treatment Team:  Early, Arvilla Meres, MD    Studies:    Events: Phosphorus replaced yesterday,   Subjective:    Overnight Issues:  patient complaining of pain this morning now that sedation is off  Objective:  Vital signs for last 24 hours: Temp:  [97.9 F (36.6 C)-100.2 F (37.9 C)] 98.1 F (36.7 C) (09/19 0408) Pulse Rate:  [56-112] 85 (09/19 0759) Resp:  [15-22] 18 (09/19 0759) BP: (89-162)/(56-146) 120/60 (09/19 0759) SpO2:  [94 %-99 %] 97 % (09/19 0801) FiO2 (%):  [40 %] 40 % (09/19 0801) Weight:  [96 kg] 96 kg (09/19 0340)  Hemodynamic parameters for last 24 hours:    Intake/Output from previous day: 09/18 0701 - 09/19 0700 In: 3273.7 [I.V.:601.8; Blood:315; NG/GT:1800; IV Piggyback:556.8] Out: 3800 [Urine:3725; Stool:75]  Intake/Output this shift: Total I/O In: 85 [I.V.:10; NG/GT:75] Out: -   Vent settings for last 24 hours: Vent Mode: PSV;CPAP FiO2 (%):  [40 %] 40 % Set Rate:  [18 bmp] 18 bmp Vt Set:  [600 mL] 600 mL PEEP:  [5 cmH20] 5 cmH20 Pressure Support:  [8 cmH20] 8 cmH20 Plateau Pressure:  [17 cmH20-20 cmH20] 20 cmH20  Physical Exam:  Gen: arousable Neuro: GCS 11t HEENT: tube in position, bearded, no deformity Resp: CTAB on PS Card: RRR, good distal pulses, palpable left radial pulse Abd: soft, NT, ND Ext: bandage over left upper arm, some edema in left forearm  Results for orders placed or performed during the hospital  encounter of 10/12/17 (from the past 24 hour(s))  Prepare RBC     Status: None   Collection Time: 10/15/17 10:19 AM  Result Value Ref Range   Order Confirmation      ORDER PROCESSED BY BLOOD BANK BB SAMPLE OR UNITS ALREADY AVAILABLE Performed at [facility], [address] [place], [place] [address]   C difficile quick scan w PCR reflex     Status: None   Collection Time: 10/15/17 10:40 AM  Result Value Ref Range   C Diff antigen NEGATIVE NEGATIVE   C Diff toxin NEGATIVE NEGATIVE   C Diff interpretation No C. difficile detected.   Glucose, capillary     Status: Abnormal   Collection Time: 10/15/17 11:40 AM  Result Value Ref Range   Glucose-Capillary 211 (H) 70 - 99 mg/dL   Comment 1 Notify RN    Comment 2 Document in Chart   Glucose, capillary     Status: Abnormal   Collection Time: 10/15/17  3:19 PM  Result Value Ref  Range   Glucose-Capillary 193 (H) 70 - 99 mg/dL  CBC     Status: Abnormal   Collection Time: 10/15/17  3:41 PM  Result Value Ref Range   WBC 4.8 4.0 - 10.5 K/uL   RBC 2.91 (L) 4.22 - 5.81 MIL/uL   Hemoglobin 8.7 (L) 13.0 - 17.0 g/dL   HCT 27.6 (L) 39.0 - 52.0 %   MCV 94.8 78.0 - 100.0 fL   MCH 29.9 26.0 - 34.0 pg   MCHC 31.5 30.0 - 36.0 g/dL   RDW 14.9 11.5 - 15.5 %   Platelets 108 (L) 150 - 400 K/uL  Glucose, capillary     Status: Abnormal   Collection Time: 10/15/17  8:35 PM  Result Value Ref Range   Glucose-Capillary 214 (H) 70 - 99 mg/dL   Comment 1 Notify RN    Comment 2 Document in Chart   Glucose, capillary     Status: Abnormal   Collection Time: 10/15/17 11:44 PM  Result Value Ref Range   Glucose-Capillary 234 (H) 70 - 99 mg/dL   Comment 1 Notify RN    Comment 2 Document in Chart   Glucose, capillary     Status: Abnormal   Collection Time: 10/16/17  4:10 AM  Result Value Ref Range   Glucose-Capillary 275 (H) 70 - 99 mg/dL   Comment 1 Notify RN    Comment 2 Document in Chart   CBC with Differential/Platelet     Status: Abnormal    Collection Time: 10/16/17  4:30 AM  Result Value Ref Range   WBC 6.4 4.0 - 10.5 K/uL   RBC 3.03 (L) 4.22 - 5.81 MIL/uL   Hemoglobin 9.1 (L) 13.0 - 17.0 g/dL   HCT 28.3 (L) 39.0 - 52.0 %   MCV 93.4 78.0 - 100.0 fL   MCH 30.0 26.0 - 34.0 pg   MCHC 32.2 30.0 - 36.0 g/dL   RDW 15.9 (H) 11.5 - 15.5 %   Platelets 131 (L) 150 - 400 K/uL   Neutrophils Relative % 69 %   Neutro Abs 4.4 1.7 - 7.7 K/uL   Lymphocytes Relative 12 %   Lymphs Abs 0.8 0.7 - 4.0 K/uL   Monocytes Relative 15 %   Monocytes Absolute 0.9 0.1 - 1.0 K/uL   Eosinophils Relative 3 %   Eosinophils Absolute 0.2 0.0 - 0.7 K/uL   Basophils Relative 0 %   Basophils Absolute 0.0 0.0 - 0.1 K/uL   Immature Granulocytes 1 %   Abs Immature Granulocytes 0.0 0.0 - 0.1 K/uL  Basic metabolic panel     Status: Abnormal   Collection Time: 10/16/17  4:30 AM  Result Value Ref Range   Sodium 143 135 - 145 mmol/L   Potassium 4.1 3.5 - 5.1 mmol/L   Chloride 115 (H) 98 - 111 mmol/L   CO2 21 (L) 22 - 32 mmol/L   Glucose, Bld 294 (H) 70 - 99 mg/dL   BUN 27 (H) 8 - 23 mg/dL   Creatinine, Ser 1.31 (H) 0.61 - 1.24 mg/dL   Calcium 7.3 (L) 8.9 - 10.3 mg/dL   GFR calc non Af Amer 50 (L) >60 mL/min   GFR calc Af Amer 58 (L) >60 mL/min   Anion gap 7 5 - 15  Glucose, capillary     Status: Abnormal   Collection Time: 10/16/17  8:21 AM  Result Value Ref Range   Glucose-Capillary 287 (H) 70 - 99 mg/dL     Gurney Maxin, M.D. [facility] Pg: [phone]  10/16/2017  *Care during the described time interval was provided by me. I have reviewed this patient's available data, including medical history, events of note, physical examination and test results as part of my evaluation.

## 2017-10-16 NOTE — Plan of Care (Signed)
Pt was extubated today.  Tolerating [place] at 2L.    Will continue to monitor.  Katherine Mantle RN

## 2017-10-16 NOTE — Progress Notes (Signed)
Inpatient Diabetes Program Recommendations  AACE/ADA: New Consensus Statement on Inpatient Glycemic Control (2015)  Target Ranges:  Prepandial:   less than 140 mg/dL      Peak postprandial:   less than 180 mg/dL (1-2 hours)      Critically ill patients:  140 - 180 mg/dL   Lab Results  Component Value Date   GLUCAP 287 (H) 10/16/2017    Review of Glycemic Control Results for MARSH, HECKLER (MRN [id number]) as of 10/16/2017 11:48  Ref. Range 10/15/2017 20:35 10/15/2017 23:44 10/16/2017 04:10 10/16/2017 08:21  Glucose-Capillary Latest Ref Range: 70 - 99 mg/dL 214 (H) 234 (H) 275 (H) 287 (H)   Diabetes history: Type 2 DM Outpatient Diabetes medications: Basaglar 10 units BID, Novolog 5 units with breakfast Current orders for Inpatient glycemic control: Novolog 0-15 units Q4H, Lantus 10 units QHS  Inpatient Diabetes Program Recommendations:    Noted patient extubated and tube feeds were discontinued.   Given how patient looked prior to initiating tube feeds and today's BS trends, consider increasing Lantus to 14 units QHS.  Also, regular diet placed. Consider carb modified?  Thanks, Bronson Curb, MSN, RNC-OB Diabetes Coordinator [phone] (8a-5p)

## 2017-10-16 NOTE — Progress Notes (Signed)
Patient ID: Casey Collins, male   DOB: 1935/10/19, 82 y.o.   MRN: [id number] Extubated.  Remains hemodynamically stable. Not following commands well currently. Minimal function in his left hand currently. 2-3+ left radial pulse. OT and PT for further evaluation

## 2017-10-16 NOTE — Plan of Care (Signed)
Patient extubated today sating well on 2L [place].
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
## 2017-10-16 NOTE — Procedures (Signed)
Extubation Procedure Note  Patient Details:   Name: Casey Collins DOB: [birth year] MRN: [id number]   Airway Documentation:    Vent end date: 10/16/17 Vent end time: 0945   Evaluation  O2 sats: stable throughout Complications: No apparent complications Patient did tolerate procedure well. Bilateral Breath Sounds: Clear, Diminished   Yes   Patient extubated per order to 4L [place] with no apparent complications. Positive cuff leak was noted prior to extubation. Patient is alert and is able to softly speak. Vitals are stable. RT will continue to monitor.   [name] [name] 10/16/2017, 9:47 AM

## 2017-10-17 ENCOUNTER — Inpatient Hospital Stay (HOSPITAL_COMMUNITY): Payer: Medicare Other

## 2017-10-17 DIAGNOSIS — S4992XS Unspecified injury of left shoulder and upper arm, sequela: Secondary | ICD-10-CM

## 2017-10-17 DIAGNOSIS — I69351 Hemiplegia and hemiparesis following cerebral infarction affecting right dominant side: Secondary | ICD-10-CM

## 2017-10-17 DIAGNOSIS — M7989 Other specified soft tissue disorders: Secondary | ICD-10-CM

## 2017-10-17 DIAGNOSIS — R269 Unspecified abnormalities of gait and mobility: Secondary | ICD-10-CM

## 2017-10-17 LAB — GLUCOSE, CAPILLARY
GLUCOSE-CAPILLARY: 131 mg/dL — AB (ref 70–99)
GLUCOSE-CAPILLARY: 139 mg/dL — AB (ref 70–99)
Glucose-Capillary: 111 mg/dL — ABNORMAL HIGH (ref 70–99)
Glucose-Capillary: 109 mg/dL — ABNORMAL HIGH (ref 70–99)
Glucose-Capillary: 143 mg/dL — ABNORMAL HIGH (ref 70–99)

## 2017-10-17 MED ORDER — INSULIN GLARGINE 100 UNIT/ML ~~LOC~~ SOLN
5.0000 [IU] | Freq: Every day | SUBCUTANEOUS | Status: DC
  Administered 2017-10-17 – 2017-10-21 (×5): 5 [IU] via SUBCUTANEOUS
  Filled 2017-10-17 (×6): qty 0.05

## 2017-10-17 MED ORDER — RESOURCE THICKENUP CLEAR PO POWD
ORAL | Status: DC | PRN
  Filled 2017-10-17: qty 125

## 2017-10-17 MED ORDER — ENOXAPARIN SODIUM 40 MG/0.4ML ~~LOC~~ SOLN
40.0000 mg | SUBCUTANEOUS | Status: DC
  Administered 2017-10-17 – 2017-10-22 (×6): 40 mg via SUBCUTANEOUS
  Filled 2017-10-17 (×6): qty 0.4

## 2017-10-17 NOTE — Progress Notes (Signed)
Trauma Service Note  Subjective: Patient is awake and seemingly oriented.  No distress.  Seems to be a bit confused but hard to tell because he cannot verbalize very well.  Objective: Vital signs in last 24 hours: Temp:  [97.4 F (36.3 C)-99.5 F (37.5 C)] 97.9 F (36.6 C) (09/20 0809) Pulse Rate:  [80-103] 80 (09/20 0700) Resp:  [15-31] 18 (09/20 0700) BP: (102-153)/(47-102) 145/66 (09/20 0700) SpO2:  [94 %-99 %] 96 % (09/20 0700) FiO2 (%):  [36 %] 36 % (09/19 0946) Last BM Date: 10/16/17  Intake/Output from previous day: 09/19 0701 - 09/20 0700 In: 498.7 [I.V.:234.9; NG/GT:213.8; IV Piggyback:50] Out: 1750 [Urine:1675; Stool:75] Intake/Output this shift: No intake/output data recorded.  General: No distress  Lungs: Clear  Abd: Benign  Extremities: Excllent pulse L. Upper extremity  Neuro: Seemingly intact.  Will get evaluation.  Lab Results: CBC  Recent Labs    10/15/17 1541 10/16/17 0430  WBC 4.8 6.4  HGB 8.7* 9.1*  HCT 27.6* 28.3*  PLT 108* 131*   BMET Recent Labs    10/15/17 0400 10/16/17 0430  NA 144 143  K 3.9 4.1  CL 118* 115*  CO2 19* 21*  GLUCOSE 193* 294*  BUN 30* 27*  CREATININE 1.37* 1.31*  CALCIUM 6.9* 7.3*   PT/INR No results for input(s): LABPROT, INR in the last 72 hours. ABG Recent Labs    10/14/17 1220  PHART 7.355  HCO3 18.6*    Studies/Results: No results found.  Anti-infectives: Anti-infectives (From admission, onward)   None      Assessment/Plan: s/p Procedure(s): LEFT UPPER ARM EXPLORATION AND REPAIR OF BRACHIAL ARTEY USING RIGHT LEG SAPHENOUS VEIN SAPHENOUS VEIN HARVEST FROM RIGHT UPPER LEG Will start OT, PT, and ST and hopefully get the patient mobilized and eating.  Transfer to SDU  LOS: 5 days   Kathryne Eriksson. Dahlia Bailiff, MD, FACS [phone] Trauma Surgeon 10/17/2017

## 2017-10-17 NOTE — Progress Notes (Signed)
Called report for transfer to [facility] . Pt tx via bed to 4N stepdown bed 2 with RN, NT, monitor and O2. RN at bedside to receive pt. Pt pulled up in bed. RN denies questions.
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
## 2017-10-17 NOTE — Progress Notes (Signed)
*  Preliminary Results* Right upper extremity venous duplex completed. Right upper extremity is negative for deep vein thrombosis.  Right upper extremity is positive for superficial vein thrombosis in the Cephalic vein.  10/17/2017 10:54 AM Jinny Blossom Dawna Part
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
## 2017-10-17 NOTE — Progress Notes (Signed)
Spoke with IV team regarding pt's PICC line and redness/swelling of arm below site. IV team recommends [place] to rule out thrombus. Notified PA of same. PA to speak with MD.
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
## 2017-10-17 NOTE — Progress Notes (Signed)
Modified Barium Swallow Progress Note  Patient Details  Name: Casey Collins MRN: [id number] Date of Birth: Mar 17, 1935  Today's Date: 10/17/2017  Modified Barium Swallow completed.  Full report located under Chart Review in the Imaging Section.  Brief recommendations include the following:  Clinical Impression  Pt demonstrates a severe oropharyngeal dysphagia with moderate to severe sensed aspiration events due to delayed swallow initiation. Cough is not fully effective at this time to eject aspirate given recent intubation and suspected laryngeal edema with moderate to severe dysphonia and decreased breath support. A chin tuck with nectar thick liquids was successful in preventing penetration and aspiration in 90% of trials, though did still result in one instance of aspiration. For now recommend honey thick liquids and dys 2 solids (poor dentition) with interventions targeting consistent use of chin tuck and improved respiratory drive such as RMT. Will follow acutely, recommend SNF level therapies.    Swallow Evaluation Recommendations       SLP Diet Recommendations: Dysphagia 2 (Fine chop) solids;Honey thick liquids   Liquid Administration via: Cup;Straw   Medication Administration: Whole meds with puree   Supervision: Full assist for feeding;Full supervision/cueing for compensatory strategies   Compensations: Slow rate;Small sips/bites;Minimize environmental distractions;Use straw to facilitate chin tuck;Chin tuck   Postural Changes: Seated upright at 90 degrees   Oral Care Recommendations: Oral care QID   Other Recommendations: Have oral suction available;Order thickener from [place], [place] CCC-SLP [phone]   Lynann Beaver 10/17/2017,3:12 PM
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
## 2017-10-17 NOTE — Progress Notes (Signed)
Rehab Admissions Coordinator Note:  Per PT recommendation, Patient was screened by [name] for appropriateness for an Inpatient Acute Rehab Consult.  At this time, [facility] will follow along to see how pt does with therapies today prior to potentially requesting IP Rehab Consult Order. Please call if questions.   [name] 10/17/2017, 8:23 AM  I can be reached at [phone].

## 2017-10-17 NOTE — Progress Notes (Signed)
Inpatient Rehabilitation Admissions Coordinator  I will follow up with patient and family on Monday to further discuss rehab venue options and support needed after d/c home.  Danne Baxter, RN, MSN Rehab Admissions Coordinator [phone] 10/17/2017 12:45 PM

## 2017-10-18 LAB — GLUCOSE, CAPILLARY
GLUCOSE-CAPILLARY: 158 mg/dL — AB (ref 70–99)
GLUCOSE-CAPILLARY: 175 mg/dL — AB (ref 70–99)
Glucose-Capillary: 140 mg/dL — ABNORMAL HIGH (ref 70–99)
Glucose-Capillary: 127 mg/dL — ABNORMAL HIGH (ref 70–99)
Glucose-Capillary: 121 mg/dL — ABNORMAL HIGH (ref 70–99)
Glucose-Capillary: 167 mg/dL — ABNORMAL HIGH (ref 70–99)

## 2017-10-18 LAB — CBC WITH DIFFERENTIAL/PLATELET
Abs Immature Granulocytes: 0.1 10*3/uL (ref 0.0–0.1)
BASOS PCT: 0 %
Basophils Absolute: 0 10*3/uL (ref 0.0–0.1)
EOS PCT: 4 %
Eosinophils Absolute: 0.3 10*3/uL (ref 0.0–0.7)
HCT: 29.1 % — ABNORMAL LOW (ref 39.0–52.0)
Hemoglobin: 9.2 g/dL — ABNORMAL LOW (ref 13.0–17.0)
Immature Granulocytes: 1 %
LYMPHS PCT: 12 %
Lymphs Abs: 1.1 10*3/uL (ref 0.7–4.0)
MCH: 30.1 pg (ref 26.0–34.0)
MCHC: 31.6 g/dL (ref 30.0–36.0)
MCV: 95.1 fL (ref 78.0–100.0)
MONO ABS: 1.1 10*3/uL — AB (ref 0.1–1.0)
Monocytes Relative: 12 %
Neutro Abs: 6.6 10*3/uL (ref 1.7–7.7)
Neutrophils Relative %: 71 %
PLATELETS: 208 10*3/uL (ref 150–400)
RBC: 3.06 MIL/uL — ABNORMAL LOW (ref 4.22–5.81)
RDW: 15.1 % (ref 11.5–15.5)
WBC: 9.2 10*3/uL (ref 4.0–10.5)

## 2017-10-18 LAB — CBC
HEMATOCRIT: 31.7 % — AB (ref 39.0–52.0)
Hemoglobin: 10 g/dL — ABNORMAL LOW (ref 13.0–17.0)
MCH: 29.8 pg (ref 26.0–34.0)
MCHC: 31.5 g/dL (ref 30.0–36.0)
MCV: 94.3 fL (ref 78.0–100.0)
Platelets: 235 10*3/uL (ref 150–400)
RBC: 3.36 MIL/uL — ABNORMAL LOW (ref 4.22–5.81)
RDW: 14.8 % (ref 11.5–15.5)
WBC: 9.6 10*3/uL (ref 4.0–10.5)

## 2017-10-18 LAB — BASIC METABOLIC PANEL
Anion gap: 6 (ref 5–15)
BUN: 17 mg/dL (ref 8–23)
CALCIUM: 8 mg/dL — AB (ref 8.9–10.3)
CO2: 27 mmol/L (ref 22–32)
Chloride: 113 mmol/L — ABNORMAL HIGH (ref 98–111)
Creatinine, Ser: 1.11 mg/dL (ref 0.61–1.24)
GFR calc Af Amer: >60 mL/min (ref >60)
GLUCOSE: 116 mg/dL — AB (ref 70–99)
Potassium: 4 mmol/L (ref 3.5–5.1)
Sodium: 146 mmol/L — ABNORMAL HIGH (ref 135–145)

## 2017-10-18 MED ORDER — GABAPENTIN 600 MG PO TABS
300.0000 mg | ORAL_TABLET | Freq: Three times a day (TID) | ORAL | Status: DC
  Administered 2017-10-18 – 2017-10-22 (×13): 300 mg via ORAL
  Filled 2017-10-18 (×13): qty 1

## 2017-10-18 MED ORDER — METOPROLOL TARTRATE 25 MG PO TABS
25.0000 mg | ORAL_TABLET | Freq: Two times a day (BID) | ORAL | Status: DC
  Administered 2017-10-18 – 2017-10-22 (×9): 25 mg via ORAL
  Filled 2017-10-18 (×9): qty 1

## 2017-10-18 MED ORDER — CITALOPRAM HYDROBROMIDE 10 MG PO TABS
10.0000 mg | ORAL_TABLET | Freq: Every day | ORAL | Status: DC
  Administered 2017-10-18 – 2017-10-22 (×5): 10 mg via ORAL
  Filled 2017-10-18 (×5): qty 1

## 2017-10-18 MED ORDER — BASAGLAR KWIKPEN 100 UNIT/ML ~~LOC~~ SOPN: 10.0000 [IU] | PEN_INJECTOR | Freq: Two times a day (BID) | SUBCUTANEOUS | Status: DC

## 2017-10-18 MED ORDER — RAMIPRIL 5 MG PO CAPS: 5.0000 mg | ORAL_CAPSULE | Freq: Every day | ORAL | Status: DC

## 2017-10-18 MED ORDER — METOPROLOL TARTRATE 25 MG PO TABS: 25.0000 mg | ORAL_TABLET | Freq: Two times a day (BID) | ORAL | Status: DC

## 2017-10-18 MED ORDER — ZOLPIDEM TARTRATE 5 MG PO TABS: 5.0000 mg | ORAL_TABLET | Freq: Every evening | ORAL | Status: DC | PRN

## 2017-10-18 MED ORDER — INSULIN ASPART 100 UNIT/ML ~~LOC~~ SOLN: 5.0000 [IU] | Freq: Every day | SUBCUTANEOUS | Status: DC

## 2017-10-18 MED ORDER — FAMOTIDINE 20 MG PO TABS
20.0000 mg | ORAL_TABLET | Freq: Every day | ORAL | Status: DC
  Administered 2017-10-19 – 2017-10-22 (×4): 20 mg via ORAL
  Filled 2017-10-18 (×4): qty 1

## 2017-10-18 MED ORDER — BETHANECHOL CHLORIDE 10 MG PO TABS: 10.0000 mg | ORAL_TABLET | Freq: Three times a day (TID) | ORAL | Status: DC

## 2017-10-18 MED ORDER — OXYCODONE HCL 5 MG PO TABS
5.0000 mg | ORAL_TABLET | Freq: Four times a day (QID) | ORAL | Status: DC | PRN
  Administered 2017-10-19: 10 mg via ORAL
  Administered 2017-10-20 (×2): 5 mg via ORAL
  Administered 2017-10-21: 10 mg via ORAL
  Administered 2017-10-22: 5 mg via ORAL
  Filled 2017-10-18 (×2): qty 1
  Filled 2017-10-18: qty 2
  Filled 2017-10-18: qty 1
  Filled 2017-10-18: qty 2

## 2017-10-18 MED ORDER — CYCLOBENZAPRINE HCL 5 MG PO TABS: 7.5000 mg | ORAL_TABLET | Freq: Three times a day (TID) | ORAL | Status: DC | PRN

## 2017-10-18 MED ORDER — ACETAMINOPHEN 500 MG PO TABS
1000.0000 mg | ORAL_TABLET | Freq: Three times a day (TID) | ORAL | Status: DC
  Administered 2017-10-18 – 2017-10-22 (×13): 1000 mg via ORAL
  Filled 2017-10-18 (×13): qty 2
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
## 2017-10-18 NOTE — Progress Notes (Signed)
On assessment, RN witnessed family member giving pt cranberry juice and pt actively coughing. When RN voiced her concern, family members stated that he always coughs while eating at home as well. Advised family that we would hold off on eating right now and would re-evaluate.   Gave pt honey-thick juice this AM and no coughing was present. Pt had to be reminded on each swallow to tuck chin.   Put in IV consult for white port on PICC no longer flushing or pulling blood. Repositioned arm but still not working. Other 2 ports working.

## 2017-10-19 LAB — BASIC METABOLIC PANEL
Anion gap: 9 (ref 5–15)
BUN: 19 mg/dL (ref 8–23)
CHLORIDE: 111 mmol/L (ref 98–111)
CO2: 23 mmol/L (ref 22–32)
CREATININE: 1.29 mg/dL — AB (ref 0.61–1.24)
Calcium: 8.2 mg/dL — ABNORMAL LOW (ref 8.9–10.3)
GFR calc Af Amer: 59 mL/min — ABNORMAL LOW (ref >60)
GFR calc non Af Amer: 51 mL/min — ABNORMAL LOW (ref >60)
Glucose, Bld: 158 mg/dL — ABNORMAL HIGH (ref 70–99)
Potassium: 4.3 mmol/L (ref 3.5–5.1)
SODIUM: 143 mmol/L (ref 135–145)

## 2017-10-19 LAB — GLUCOSE, CAPILLARY
GLUCOSE-CAPILLARY: 163 mg/dL — AB (ref 70–99)
GLUCOSE-CAPILLARY: 204 mg/dL — AB (ref 70–99)
GLUCOSE-CAPILLARY: 201 mg/dL — AB (ref 70–99)
GLUCOSE-CAPILLARY: 155 mg/dL — AB (ref 70–99)
Glucose-Capillary: 118 mg/dL — ABNORMAL HIGH (ref 70–99)
Glucose-Capillary: 136 mg/dL — ABNORMAL HIGH (ref 70–99)
Glucose-Capillary: 154 mg/dL — ABNORMAL HIGH (ref 70–99)

## 2017-10-19 LAB — CBC
HCT: 32.1 % — ABNORMAL LOW (ref 39.0–52.0)
HEMOGLOBIN: 10.1 g/dL — AB (ref 13.0–17.0)
MCH: 29.9 pg (ref 26.0–34.0)
MCHC: 31.5 g/dL (ref 30.0–36.0)
MCV: 95 fL (ref 78.0–100.0)
Platelets: 264 10*3/uL (ref 150–400)
RBC: 3.38 MIL/uL — ABNORMAL LOW (ref 4.22–5.81)
RDW: 14.7 % (ref 11.5–15.5)
WBC: 9.5 10*3/uL (ref 4.0–10.5)

## 2017-10-19 MED ORDER — ENSURE ENLIVE PO LIQD
237.0000 mL | Freq: Two times a day (BID) | ORAL | Status: DC
  Administered 2017-10-21 – 2017-10-22 (×3): 237 mL via ORAL

## 2017-10-19 NOTE — Progress Notes (Signed)
Spoke with PA at bedside about LUE wound opening at middle of wound and redressed with PA, continue current care.  Loss of IV access, PA and MD made aware, will not replace IV at this time.
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
## 2017-10-20 ENCOUNTER — Inpatient Hospital Stay (HOSPITAL_COMMUNITY): Payer: Medicare Other

## 2017-10-20 ENCOUNTER — Telehealth: Payer: Self-pay | Admitting: Vascular Surgery

## 2017-10-20 ENCOUNTER — Encounter (HOSPITAL_COMMUNITY): Payer: Self-pay

## 2017-10-20 LAB — GLUCOSE, CAPILLARY
GLUCOSE-CAPILLARY: 114 mg/dL — AB (ref 70–99)
GLUCOSE-CAPILLARY: 122 mg/dL — AB (ref 70–99)
GLUCOSE-CAPILLARY: 151 mg/dL — AB (ref 70–99)
GLUCOSE-CAPILLARY: 185 mg/dL — AB (ref 70–99)
Glucose-Capillary: 149 mg/dL — ABNORMAL HIGH (ref 70–99)

## 2017-10-20 MED ORDER — DOUBLE ANTIBIOTIC 500-10000 UNIT/GM EX OINT
TOPICAL_OINTMENT | Freq: Two times a day (BID) | CUTANEOUS | Status: DC
  Administered 2017-10-20 – 2017-10-22 (×5): via TOPICAL
  Filled 2017-10-20: qty 28.4

## 2017-10-20 NOTE — Progress Notes (Signed)
Inpatient Rehabilitation Admissions Coordinator  I have left a message for pt's daughter, Vaughan Basta, to contact me to begin discussions concerning pt's rehab venue options.  Danne Baxter, RN, MSN Rehab Admissions Coordinator [phone] 10/20/2017 11:55 AM

## 2017-10-20 NOTE — Care Management Important Message (Signed)
Important Message  Patient Details  Name: Casey Collins MRN: [id number] Date of Birth: [birth year]   Medicare Important Message Given:  No  Patient could not sign Unsign copy left  Orbie Pyo 10/20/2017, 2:31 PM
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
## 2017-10-20 NOTE — Social Work (Addendum)
CSW also following regarding disposition- acknowledging recommendation of CIR, if SNF becomes more appropriate plan will f/u with pt family. FL2 completed.  Alexander Mt, [place] Work [phone]
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
## 2017-10-20 NOTE — Telephone Encounter (Signed)
sch appt lvm mld ltr 11/11/17 1045am p/o MD
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
## 2017-10-21 LAB — BASIC METABOLIC PANEL
Anion gap: 7 (ref 5–15)
BUN: 18 mg/dL (ref 8–23)
CALCIUM: 8.4 mg/dL — AB (ref 8.9–10.3)
CO2: 27 mmol/L (ref 22–32)
CREATININE: 1.25 mg/dL — AB (ref 0.61–1.24)
Chloride: 107 mmol/L (ref 98–111)
GFR calc Af Amer: >60 mL/min (ref >60)
GFR, EST NON AFRICAN AMERICAN: 53 mL/min — AB (ref >60)
GLUCOSE: 123 mg/dL — AB (ref 70–99)
Potassium: 3.9 mmol/L (ref 3.5–5.1)
SODIUM: 141 mmol/L (ref 135–145)

## 2017-10-21 LAB — CBC
HEMATOCRIT: 35.4 % — AB (ref 39.0–52.0)
Hemoglobin: 11.1 g/dL — ABNORMAL LOW (ref 13.0–17.0)
MCH: 29.6 pg (ref 26.0–34.0)
MCHC: 31.4 g/dL (ref 30.0–36.0)
MCV: 94.4 fL (ref 78.0–100.0)
Platelets: 357 10*3/uL (ref 150–400)
RBC: 3.75 MIL/uL — ABNORMAL LOW (ref 4.22–5.81)
RDW: 14.4 % (ref 11.5–15.5)
WBC: 9.8 10*3/uL (ref 4.0–10.5)

## 2017-10-21 LAB — GLUCOSE, CAPILLARY
GLUCOSE-CAPILLARY: 170 mg/dL — AB (ref 70–99)
GLUCOSE-CAPILLARY: 197 mg/dL — AB (ref 70–99)
GLUCOSE-CAPILLARY: 134 mg/dL — AB (ref 70–99)
Glucose-Capillary: 106 mg/dL — ABNORMAL HIGH (ref 70–99)
Glucose-Capillary: 113 mg/dL — ABNORMAL HIGH (ref 70–99)
Glucose-Capillary: 220 mg/dL — ABNORMAL HIGH (ref 70–99)

## 2017-10-21 MED ORDER — LORAZEPAM 2 MG/ML IJ SOLN
1.0000 mg | Freq: Four times a day (QID) | INTRAMUSCULAR | Status: DC | PRN
  Administered 2017-10-21: 1 mg via INTRAVENOUS
  Filled 2017-10-21: qty 1

## 2017-10-21 NOTE — Progress Notes (Signed)
Inpatient Rehabilitation Admissions Coordinator  I received return call from pt's daughter. We discussed goals and expectations of an inpt rehab admit. Pt previously a patient in  2010 and returned home with daughter. Daughter prefers an inpt rehab admit and can provide caregiver support needed. I will follow pt's progress to potentially admit him over the next few days when medically ready.  Danne Baxter, RN, MSN Rehab Admissions Coordinator [phone] 10/21/2017 12:07 PM

## 2017-10-21 NOTE — Plan of Care (Signed)
  Problem: Clinical Measurements: Goal: Ability to maintain clinical measurements within normal limits will improve Outcome: Progressing Goal: Postoperative complications will be avoided or minimized Outcome: Progressing   Problem: Skin Integrity: Goal: Demonstration of wound healing without infection will improve Outcome: Progressing   Problem: Education: Goal: Knowledge of General Education information will improve Description Including pain rating scale, medication(s)/side effects and non-pharmacologic comfort measures Outcome: Progressing   Problem: Health Behavior/Discharge Planning: Goal: Ability to manage health-related needs will improve Outcome: Progressing   Problem: Clinical Measurements: Goal: Ability to maintain clinical measurements within normal limits will improve Outcome: Progressing Goal: Will remain free from infection Outcome: Progressing Goal: Diagnostic test results will improve Outcome: Progressing Goal: Respiratory complications will improve Outcome: Progressing Goal: Cardiovascular complication will be avoided Outcome: Progressing   Problem: Activity: Goal: Risk for activity intolerance will decrease Outcome: Progressing   Problem: Nutrition: Goal: Adequate nutrition will be maintained Outcome: Progressing   Problem: Coping: Goal: Level of anxiety will decrease Outcome: Progressing   Problem: Elimination: Goal: Will not experience complications related to bowel motility Outcome: Progressing Goal: Will not experience complications related to urinary retention Outcome: Progressing   Problem: Pain Managment: Goal: General experience of comfort will improve Outcome: Progressing   Problem: Safety: Goal: Ability to remain free from injury will improve Outcome: Progressing   Problem: Skin Integrity: Goal: Risk for impaired skin integrity will decrease Outcome: Progressing
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
## 2017-10-21 NOTE — Progress Notes (Signed)
Patient ID: Casey Collins, male   DOB: 1935-10-12, 82 y.o.   MRN: [id number] More alert.  Very weak overall.  Has only been up from bed to chair Bilateral arm swelling.  2-3+ left radial pulse.  Dressing intact.  Does drain.  Nurse reports some ongoing bloody oozing from exit wound

## 2017-10-22 ENCOUNTER — Encounter (HOSPITAL_COMMUNITY): Payer: Self-pay

## 2017-10-22 ENCOUNTER — Encounter (HOSPITAL_COMMUNITY): Payer: Self-pay | Admitting: Neurology

## 2017-10-22 ENCOUNTER — Inpatient Hospital Stay (HOSPITAL_COMMUNITY)
Admission: RE | Admit: 2017-10-22 | Discharge: 2017-11-08 | DRG: 092 | Disposition: A | Discharge status: Home Health | Payer: Medicare Other | Source: Intra-hospital | Attending: Physical Medicine & Rehabilitation | Admitting: Physical Medicine & Rehabilitation

## 2017-10-22 ENCOUNTER — Other Ambulatory Visit: Payer: Self-pay

## 2017-10-22 DIAGNOSIS — D62 Acute posthemorrhagic anemia: Secondary | ICD-10-CM

## 2017-10-22 DIAGNOSIS — K5901 Slow transit constipation: Secondary | ICD-10-CM

## 2017-10-22 DIAGNOSIS — Z87891 Personal history of nicotine dependence: Secondary | ICD-10-CM | POA: Diagnosis not present

## 2017-10-22 DIAGNOSIS — S143XXD Injury of brachial plexus, subsequent encounter: Secondary | ICD-10-CM | POA: Diagnosis not present

## 2017-10-22 DIAGNOSIS — N390 Urinary tract infection, site not specified: Secondary | ICD-10-CM

## 2017-10-22 DIAGNOSIS — M792 Neuralgia and neuritis, unspecified: Secondary | ICD-10-CM

## 2017-10-22 DIAGNOSIS — I69319 Unspecified symptoms and signs involving cognitive functions following cerebral infarction: Secondary | ICD-10-CM

## 2017-10-22 DIAGNOSIS — M25532 Pain in left wrist: Secondary | ICD-10-CM | POA: Diagnosis not present

## 2017-10-22 DIAGNOSIS — I69320 Aphasia following cerebral infarction: Secondary | ICD-10-CM | POA: Diagnosis not present

## 2017-10-22 DIAGNOSIS — Z794 Long term (current) use of insulin: Secondary | ICD-10-CM | POA: Diagnosis not present

## 2017-10-22 DIAGNOSIS — I69391 Dysphagia following cerebral infarction: Secondary | ICD-10-CM

## 2017-10-22 DIAGNOSIS — I69322 Dysarthria following cerebral infarction: Secondary | ICD-10-CM | POA: Diagnosis not present

## 2017-10-22 DIAGNOSIS — W3400XA Accidental discharge from unspecified firearms or gun, initial encounter: Secondary | ICD-10-CM

## 2017-10-22 DIAGNOSIS — X749XXD Intentional self-harm by unspecified firearm discharge, subsequent encounter: Secondary | ICD-10-CM | POA: Diagnosis not present

## 2017-10-22 DIAGNOSIS — E119 Type 2 diabetes mellitus without complications: Secondary | ICD-10-CM | POA: Diagnosis not present

## 2017-10-22 DIAGNOSIS — R2689 Other abnormalities of gait and mobility: Secondary | ICD-10-CM | POA: Diagnosis present

## 2017-10-22 DIAGNOSIS — Z8249 Family history of ischemic heart disease and other diseases of the circulatory system: Secondary | ICD-10-CM

## 2017-10-22 DIAGNOSIS — E1165 Type 2 diabetes mellitus with hyperglycemia: Secondary | ICD-10-CM | POA: Diagnosis not present

## 2017-10-22 DIAGNOSIS — R1310 Dysphagia, unspecified: Secondary | ICD-10-CM

## 2017-10-22 DIAGNOSIS — R7309 Other abnormal glucose: Secondary | ICD-10-CM

## 2017-10-22 DIAGNOSIS — R0989 Other specified symptoms and signs involving the circulatory and respiratory systems: Secondary | ICD-10-CM

## 2017-10-22 DIAGNOSIS — Z79899 Other long term (current) drug therapy: Secondary | ICD-10-CM | POA: Diagnosis not present

## 2017-10-22 DIAGNOSIS — K5900 Constipation, unspecified: Secondary | ICD-10-CM | POA: Diagnosis not present

## 2017-10-22 DIAGNOSIS — G540 Brachial plexus disorders: Secondary | ICD-10-CM | POA: Diagnosis present

## 2017-10-22 DIAGNOSIS — S143XXS Injury of brachial plexus, sequela: Secondary | ICD-10-CM | POA: Diagnosis not present

## 2017-10-22 DIAGNOSIS — Z7982 Long term (current) use of aspirin: Secondary | ICD-10-CM

## 2017-10-22 DIAGNOSIS — I10 Essential (primary) hypertension: Secondary | ICD-10-CM | POA: Diagnosis not present

## 2017-10-22 DIAGNOSIS — Z7902 Long term (current) use of antithrombotics/antiplatelets: Secondary | ICD-10-CM

## 2017-10-22 DIAGNOSIS — I69351 Hemiplegia and hemiparesis following cerebral infarction affecting right dominant side: Secondary | ICD-10-CM

## 2017-10-22 DIAGNOSIS — B9620 Unspecified Escherichia coli [E. coli] as the cause of diseases classified elsewhere: Secondary | ICD-10-CM

## 2017-10-22 LAB — GLUCOSE, CAPILLARY
GLUCOSE-CAPILLARY: 100 mg/dL — AB (ref 70–99)
GLUCOSE-CAPILLARY: 114 mg/dL — AB (ref 70–99)
GLUCOSE-CAPILLARY: 186 mg/dL — AB (ref 70–99)
Glucose-Capillary: 130 mg/dL — ABNORMAL HIGH (ref 70–99)
Glucose-Capillary: 232 mg/dL — ABNORMAL HIGH (ref 70–99)
Glucose-Capillary: 212 mg/dL — ABNORMAL HIGH (ref 70–99)

## 2017-10-22 LAB — CBC
HEMATOCRIT: 34.4 % — AB (ref 39.0–52.0)
Hemoglobin: 10.8 g/dL — ABNORMAL LOW (ref 13.0–17.0)
MCH: 29.7 pg (ref 26.0–34.0)
MCHC: 31.4 g/dL (ref 30.0–36.0)
MCV: 94.5 fL (ref 78.0–100.0)
Platelets: 390 10*3/uL (ref 150–400)
RBC: 3.64 MIL/uL — ABNORMAL LOW (ref 4.22–5.81)
RDW: 14.2 % (ref 11.5–15.5)
WBC: 7.8 10*3/uL (ref 4.0–10.5)

## 2017-10-22 LAB — CREATININE, SERUM
Creatinine, Ser: 1.24 mg/dL (ref 0.61–1.24)
GFR calc Af Amer: >60 mL/min (ref >60)
GFR, EST NON AFRICAN AMERICAN: 53 mL/min — AB (ref >60)

## 2017-10-22 MED ORDER — ONDANSETRON HCL 4 MG/2ML IJ SOLN: 4.0000 mg | Freq: Four times a day (QID) | INTRAMUSCULAR | Status: DC | PRN

## 2017-10-22 MED ORDER — INSULIN GLARGINE 100 UNIT/ML ~~LOC~~ SOLN
5.0000 [IU] | Freq: Every day | SUBCUTANEOUS | Status: DC
  Administered 2017-10-22 – 2017-10-24 (×3): 5 [IU] via SUBCUTANEOUS
  Filled 2017-10-22 (×3): qty 0.05

## 2017-10-22 MED ORDER — LORAZEPAM 2 MG/ML IJ SOLN: 1.0000 mg | Freq: Four times a day (QID) | INTRAMUSCULAR | Status: DC | PRN

## 2017-10-22 MED ORDER — RESOURCE THICKENUP CLEAR PO POWD
ORAL | Status: DC | PRN
  Filled 2017-10-22 (×2): qty 125

## 2017-10-22 MED ORDER — ENOXAPARIN SODIUM 40 MG/0.4ML ~~LOC~~ SOLN: 40.0000 mg | SUBCUTANEOUS | Status: DC

## 2017-10-22 MED ORDER — FAMOTIDINE 20 MG PO TABS
20.0000 mg | ORAL_TABLET | Freq: Every day | ORAL | Status: DC
  Administered 2017-10-23 – 2017-11-08 (×17): 20 mg via ORAL
  Filled 2017-10-22 (×17): qty 1

## 2017-10-22 MED ORDER — CITALOPRAM HYDROBROMIDE 10 MG PO TABS
10.0000 mg | ORAL_TABLET | Freq: Every day | ORAL | Status: DC
  Administered 2017-10-23 – 2017-11-08 (×17): 10 mg via ORAL
  Filled 2017-10-22 (×17): qty 1

## 2017-10-22 MED ORDER — INSULIN ASPART 100 UNIT/ML ~~LOC~~ SOLN
0.0000 [IU] | Freq: Three times a day (TID) | SUBCUTANEOUS | Status: DC
  Administered 2017-10-23 (×3): 3 [IU] via SUBCUTANEOUS
  Administered 2017-10-24: 2 [IU] via SUBCUTANEOUS
  Administered 2017-10-24: 8 [IU] via SUBCUTANEOUS
  Administered 2017-10-24: 2 [IU] via SUBCUTANEOUS
  Administered 2017-10-25: 3 [IU] via SUBCUTANEOUS
  Administered 2017-10-25 (×2): 5 [IU] via SUBCUTANEOUS
  Administered 2017-10-26 (×3): 3 [IU] via SUBCUTANEOUS
  Administered 2017-10-27: 2 [IU] via SUBCUTANEOUS
  Administered 2017-10-27 (×2): 3 [IU] via SUBCUTANEOUS
  Administered 2017-10-28: 5 [IU] via SUBCUTANEOUS
  Administered 2017-10-28: 2 [IU] via SUBCUTANEOUS
  Administered 2017-10-28: 5 [IU] via SUBCUTANEOUS
  Administered 2017-10-29: 2 [IU] via SUBCUTANEOUS
  Administered 2017-10-29: 5 [IU] via SUBCUTANEOUS
  Administered 2017-10-29: 2 [IU] via SUBCUTANEOUS
  Administered 2017-10-30: 3 [IU] via SUBCUTANEOUS
  Administered 2017-10-30: 8 [IU] via SUBCUTANEOUS
  Administered 2017-10-31: 3 [IU] via SUBCUTANEOUS
  Administered 2017-10-31: 5 [IU] via SUBCUTANEOUS
  Administered 2017-11-01: 3 [IU] via SUBCUTANEOUS
  Administered 2017-11-01: 2 [IU] via SUBCUTANEOUS
  Administered 2017-11-02 (×2): 3 [IU] via SUBCUTANEOUS
  Administered 2017-11-03 (×2): 2 [IU] via SUBCUTANEOUS
  Administered 2017-11-03 – 2017-11-04 (×2): 3 [IU] via SUBCUTANEOUS
  Administered 2017-11-04: 2 [IU] via SUBCUTANEOUS
  Administered 2017-11-05: 3 [IU] via SUBCUTANEOUS
  Administered 2017-11-05: 5 [IU] via SUBCUTANEOUS
  Administered 2017-11-06: 2 [IU] via SUBCUTANEOUS
  Administered 2017-11-06 – 2017-11-07 (×2): 3 [IU] via SUBCUTANEOUS

## 2017-10-22 MED ORDER — METOPROLOL TARTRATE 25 MG PO TABS
25.0000 mg | ORAL_TABLET | Freq: Two times a day (BID) | ORAL | Status: DC
  Administered 2017-10-22 – 2017-11-08 (×27): 25 mg via ORAL
  Filled 2017-10-22 (×34): qty 1

## 2017-10-22 MED ORDER — OXYCODONE HCL 5 MG PO TABS
5.0000 mg | ORAL_TABLET | Freq: Four times a day (QID) | ORAL | Status: DC | PRN
  Administered 2017-10-22: 10 mg via ORAL
  Administered 2017-10-22 – 2017-10-25 (×2): 5 mg via ORAL
  Administered 2017-10-26: 10 mg via ORAL
  Administered 2017-10-26: 5 mg via ORAL
  Administered 2017-10-27 – 2017-10-29 (×4): 10 mg via ORAL
  Filled 2017-10-22: qty 2
  Filled 2017-10-22: qty 1
  Filled 2017-10-22: qty 2
  Filled 2017-10-22 (×2): qty 1
  Filled 2017-10-22: qty 2
  Filled 2017-10-22: qty 1
  Filled 2017-10-22: qty 2
  Filled 2017-10-22: qty 1
  Filled 2017-10-22: qty 2
  Filled 2017-10-22: qty 1

## 2017-10-22 MED ORDER — HYDRALAZINE HCL 20 MG/ML IJ SOLN: 10.0000 mg | INTRAMUSCULAR | Status: DC | PRN

## 2017-10-22 MED ORDER — ENSURE ENLIVE PO LIQD
237.0000 mL | Freq: Two times a day (BID) | ORAL | Status: DC
  Administered 2017-10-22: 237 mL via ORAL

## 2017-10-22 MED ORDER — ENOXAPARIN SODIUM 40 MG/0.4ML ~~LOC~~ SOLN
40.0000 mg | SUBCUTANEOUS | Status: DC
  Administered 2017-10-23 – 2017-11-07 (×16): 40 mg via SUBCUTANEOUS
  Filled 2017-10-22 (×16): qty 0.4

## 2017-10-22 MED ORDER — GABAPENTIN 600 MG PO TABS
300.0000 mg | ORAL_TABLET | Freq: Three times a day (TID) | ORAL | Status: DC
  Administered 2017-10-22 – 2017-11-08 (×50): 300 mg via ORAL
  Filled 2017-10-22 (×50): qty 1

## 2017-10-22 MED ORDER — ACETAMINOPHEN 325 MG PO TABS
325.0000 mg | ORAL_TABLET | ORAL | Status: DC | PRN
Start: 2017-10-22 — End: 2017-11-08
  Administered 2017-10-23 (×2): 650 mg via ORAL
  Administered 2017-10-23: 325 mg via ORAL
  Administered 2017-10-24 – 2017-11-08 (×22): 650 mg via ORAL
  Filled 2017-10-22 (×25): qty 2

## 2017-10-22 MED ORDER — INSULIN ASPART 100 UNIT/ML ~~LOC~~ SOLN: 0.0000 [IU] | SUBCUTANEOUS | Status: DC

## 2017-10-22 MED ORDER — ONDANSETRON 4 MG PO TBDP
4.0000 mg | ORAL_TABLET | Freq: Four times a day (QID) | ORAL | Status: DC | PRN
Start: 2017-10-22 — End: 2017-11-08
  Filled 2017-10-22: qty 1
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
## 2017-10-22 NOTE — Plan of Care (Signed)
  Problem: Clinical Measurements: Goal: Ability to maintain clinical measurements within normal limits will improve Outcome: Progressing Goal: Postoperative complications will be avoided or minimized Outcome: Progressing   Problem: Skin Integrity: Goal: Demonstration of wound healing without infection will improve Outcome: Progressing   Problem: Education: Goal: Knowledge of General Education information will improve Description Including pain rating scale, medication(s)/side effects and non-pharmacologic comfort measures Outcome: Progressing   Problem: Health Behavior/Discharge Planning: Goal: Ability to manage health-related needs will improve Outcome: Progressing   Problem: Clinical Measurements: Goal: Ability to maintain clinical measurements within normal limits will improve Outcome: Progressing Goal: Will remain free from infection Outcome: Progressing Goal: Diagnostic test results will improve Outcome: Progressing Goal: Respiratory complications will improve Outcome: Progressing Goal: Cardiovascular complication will be avoided Outcome: Progressing   Problem: Activity: Goal: Risk for activity intolerance will decrease Outcome: Progressing   Problem: Nutrition: Goal: Adequate nutrition will be maintained Outcome: Progressing   Problem: Coping: Goal: Level of anxiety will decrease Outcome: Progressing   Problem: Elimination: Goal: Will not experience complications related to bowel motility Outcome: Progressing Goal: Will not experience complications related to urinary retention Outcome: Progressing   Problem: Pain Managment: Goal: General experience of comfort will improve Outcome: Progressing   Problem: Safety: Goal: Ability to remain free from injury will improve Outcome: Progressing   Problem: Skin Integrity: Goal: Risk for impaired skin integrity will decrease Outcome: Progressing
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
## 2017-10-22 NOTE — Progress Notes (Signed)
Insulin coverage orders did not line up with [facility] orders in regards to frequency. Orders received from practitioner on-call.

## 2017-10-22 NOTE — Social Work (Signed)
CSW acknowledging CIR admission today. Pt remains inappropriate for SBIRT due to cognitive status, ETOH screen on admission was also <10.  CSW signing off. Please consult if any additional needs arise.  [place], [place] Work [phone]

## 2017-10-23 ENCOUNTER — Inpatient Hospital Stay (HOSPITAL_COMMUNITY): Payer: Medicare Other | Admitting: Speech Pathology

## 2017-10-23 ENCOUNTER — Inpatient Hospital Stay (HOSPITAL_COMMUNITY): Payer: Self-pay | Admitting: Occupational Therapy

## 2017-10-23 ENCOUNTER — Inpatient Hospital Stay (HOSPITAL_COMMUNITY): Payer: Self-pay | Admitting: Physical Therapy

## 2017-10-23 DIAGNOSIS — W3400XA Accidental discharge from unspecified firearms or gun, initial encounter: Secondary | ICD-10-CM

## 2017-10-23 DIAGNOSIS — I10 Essential (primary) hypertension: Secondary | ICD-10-CM

## 2017-10-23 DIAGNOSIS — S143XXD Injury of brachial plexus, subsequent encounter: Secondary | ICD-10-CM

## 2017-10-23 DIAGNOSIS — I69351 Hemiplegia and hemiparesis following cerebral infarction affecting right dominant side: Secondary | ICD-10-CM

## 2017-10-23 LAB — COMPREHENSIVE METABOLIC PANEL
ALT: 167 U/L — ABNORMAL HIGH (ref 0–44)
AST: 169 U/L — ABNORMAL HIGH (ref 15–41)
Albumin: 2.8 g/dL — ABNORMAL LOW (ref 3.5–5.0)
Alkaline Phosphatase: 312 U/L — ABNORMAL HIGH (ref 38–126)
Anion gap: 9 (ref 5–15)
BILIRUBIN TOTAL: 1.2 mg/dL (ref 0.3–1.2)
BUN: 19 mg/dL (ref 8–23)
CO2: 24 mmol/L (ref 22–32)
Calcium: 8.7 mg/dL — ABNORMAL LOW (ref 8.9–10.3)
Chloride: 105 mmol/L (ref 98–111)
Creatinine, Ser: 1.25 mg/dL — ABNORMAL HIGH (ref 0.61–1.24)
GFR calc Af Amer: >60 mL/min (ref >60)
GFR, EST NON AFRICAN AMERICAN: 53 mL/min — AB (ref >60)
Glucose, Bld: 165 mg/dL — ABNORMAL HIGH (ref 70–99)
POTASSIUM: 4.4 mmol/L (ref 3.5–5.1)
Sodium: 138 mmol/L (ref 135–145)
TOTAL PROTEIN: 5.9 g/dL — AB (ref 6.5–8.1)

## 2017-10-23 LAB — GLUCOSE, CAPILLARY
GLUCOSE-CAPILLARY: 157 mg/dL — AB (ref 70–99)
GLUCOSE-CAPILLARY: 193 mg/dL — AB (ref 70–99)
GLUCOSE-CAPILLARY: 216 mg/dL — AB (ref 70–99)
Glucose-Capillary: 170 mg/dL — ABNORMAL HIGH (ref 70–99)

## 2017-10-23 LAB — CBC WITH DIFFERENTIAL/PLATELET
Abs Immature Granulocytes: 0.1 10*3/uL (ref 0.0–0.1)
Basophils Absolute: 0.1 10*3/uL (ref 0.0–0.1)
Basophils Relative: 1 %
EOS ABS: 0.4 10*3/uL (ref 0.0–0.7)
EOS PCT: 4 %
HEMATOCRIT: 34.4 % — AB (ref 39.0–52.0)
Hemoglobin: 11 g/dL — ABNORMAL LOW (ref 13.0–17.0)
Immature Granulocytes: 1 %
LYMPHS PCT: 15 %
Lymphs Abs: 1.5 10*3/uL (ref 0.7–4.0)
MCH: 29.8 pg (ref 26.0–34.0)
MCHC: 32 g/dL (ref 30.0–36.0)
MCV: 93.2 fL (ref 78.0–100.0)
MONO ABS: 1.1 10*3/uL — AB (ref 0.1–1.0)
MONOS PCT: 10 %
Neutro Abs: 7.2 10*3/uL (ref 1.7–7.7)
Neutrophils Relative %: 69 %
Platelets: 456 10*3/uL — ABNORMAL HIGH (ref 150–400)
RBC: 3.69 MIL/uL — ABNORMAL LOW (ref 4.22–5.81)
RDW: 14.1 % (ref 11.5–15.5)
WBC: 10.3 10*3/uL (ref 4.0–10.5)

## 2017-10-23 MED ORDER — RAMIPRIL 5 MG PO CAPS
5.0000 mg | ORAL_CAPSULE | Freq: Every day | ORAL | Status: DC
  Administered 2017-10-23 – 2017-10-29 (×7): 5 mg via ORAL
  Filled 2017-10-23 (×8): qty 1
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
## 2017-10-23 NOTE — Care Management Note (Signed)
[facility] Individual Statement of Services  Patient Name:  Casey Collins  Date:  10/23/2017  Welcome to the [facility].  Our goal is to provide you with an individualized program based on your diagnosis and situation, designed to meet your specific needs.  With this comprehensive rehabilitation program, you will be expected to participate in at least 3 hours of rehabilitation therapies Monday-Friday, with modified therapy programming on the weekends.  Your rehabilitation program will include the following services:  Physical Therapy (PT), Occupational Therapy (OT), Speech Therapy (ST), 24 hour per day rehabilitation nursing, Neuropsychology, Case Management (Social Worker), Rehabilitation Medicine, Nutrition Services and Pharmacy Services  Weekly team conferences will be held on Wednesday to discuss your progress.  Your Social Worker will talk with you frequently to get your input and to update you on team discussions.  Team conferences with you and your family in attendance may also be held.  Expected length of stay: 14-18 days Overall anticipated outcome: supervision with cueing  Depending on your progress and recovery, your program may change. Your Social Worker will coordinate services and will keep you informed of any changes. Your Social Worker's name and contact numbers are listed  below.  The following services may also be recommended but are not provided by the [facility]:    [place] will be made to provide these services after discharge if needed.  Arrangements include referral to agencies that provide these services.  Your insurance has been verified to be:  Medicare Part A & B Your primary doctor is:  Burnard Bunting  Pertinent information will be shared with your doctor and your insurance company.  Social Worker:  Ovidio Kin, [place] or ([phone]  Information discussed with and copy given to patient by: Elease Hashimoto, 10/23/2017, 9:05 AM
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
## 2017-10-24 ENCOUNTER — Inpatient Hospital Stay (HOSPITAL_COMMUNITY): Payer: Self-pay | Admitting: Speech Pathology

## 2017-10-24 ENCOUNTER — Inpatient Hospital Stay (HOSPITAL_COMMUNITY): Payer: Self-pay

## 2017-10-24 ENCOUNTER — Inpatient Hospital Stay (HOSPITAL_COMMUNITY): Payer: Self-pay | Admitting: Physical Therapy

## 2017-10-24 LAB — GLUCOSE, CAPILLARY
GLUCOSE-CAPILLARY: 147 mg/dL — AB (ref 70–99)
Glucose-Capillary: 254 mg/dL — ABNORMAL HIGH (ref 70–99)
Glucose-Capillary: 167 mg/dL — ABNORMAL HIGH (ref 70–99)
Glucose-Capillary: 134 mg/dL — ABNORMAL HIGH (ref 70–99)
Glucose-Capillary: 206 mg/dL — ABNORMAL HIGH (ref 70–99)
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
## 2017-10-24 NOTE — Progress Notes (Signed)
Speech Language Pathology Daily Session Note  Patient Details  Name: Casey Collins MRN: [id number] Date of Birth: 1935/10/10  Today's Date: 10/24/2017 SLP Individual Time: 1400-1500 SLP Individual Time Calculation (min): 60 min  Short Term Goals: Week 1: SLP Short Term Goal 1 (Week 1): Patient will consume current diet with minimal overt s/s of aspiraton with Mod A verbal cues for use of swallowing compensatory strategies.  SLP Short Term Goal 2 (Week 1): Patient will consume trials of honey-thick liquids via cup without overt s/s of aspiration with Mod A verbal cues for use of swallowing strategies over 2 sessions prior to upgrade.  SLP Short Term Goal 3 (Week 1): Patient will consume trials of Dys. 2 textures with effeicient mastication and minimal oral residue with minimal overt s/s of aspiration with Mod A verbal cues over 2 sessions prior to upgrade.  SLP Short Term Goal 4 (Week 1): Patient will utilize external aids to orient to place, time and situation with Mod A verbal cues.  SLP Short Term Goal 5 (Week 1): Patient will demonstrate sustained attention to functional tasks for 5 minutes with Mod A verbal cues.  SLP Short Term Goal 6 (Week 1): Patient will utilize an increased vocal intensity to be ~50% intelligibile at the phrase level with Max A verbal cues.   Skilled Therapeutic Interventions: Skilled treatment session focused on dysphagia and communication goals as well as family education with the patient's son-in-law. Upon arrival, patient was asleep. It took more than a reasonable amount of time and overall Max A multimodal cues for arousal. Patient able to maintain arousal intermittently for ~2-5 minute intervals but required Max A verbal cues for sustained attention to self-feeding tasks and basic conversation. Patient required Max-Total A hand over hand assist to initiate self-feeding and consumed honey-thick liquids via tsp and minimal amount of Dys. 1 textures with what  appeared to be a delayed swallow initiation and one large, delayed coughing episode. Recommend patient continue current diet but consume only when awake and alert and OOB for meals. Patient verbalized at the word and phrase level with Max A verbal cues for use of an increased vocal intensity with ~25-50% intelligibility. Patient's son-in-law provided extensive education in regards to current diet recommendations and clinical reasoning. He verbalized understanding but will need reinforcement. Patient left upright in bed with alarm on and all needs within reach. Continue with current plan of care.      Function:  Eating Eating   Modified Consistency Diet: Yes Eating Assist Level: Helper feeds patient;Supervision or verbal cues           Cognition Comprehension Comprehension assist level: Understands basic 75 - 89% of the time/ requires cueing 10 - 24% of the time  Expression   Expression assist level: Expresses basic 50 - 74% of the time/requires cueing 25 - 49% of the time. Needs to repeat parts of sentences.  Social Interaction Social Interaction assist level: Interacts appropriately 75 - 89% of the time - Needs redirection for appropriate language or to initiate interaction.  Problem Solving Problem solving assist level: Solves basic 25 - 49% of the time - needs direction more than half the time to initiate, plan or complete simple activities  Memory Memory assist level: Recognizes or recalls 25 - 49% of the time/requires cueing 50 - 75% of the time    Pain No/Denies Pain   Therapy/Group: Individual Therapy  Casey Collins 10/24/2017, 3:16 PM
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
## 2017-10-25 ENCOUNTER — Inpatient Hospital Stay (HOSPITAL_COMMUNITY): Payer: Self-pay | Admitting: Speech Pathology

## 2017-10-25 ENCOUNTER — Inpatient Hospital Stay (HOSPITAL_COMMUNITY): Payer: Self-pay | Admitting: Physical Therapy

## 2017-10-25 ENCOUNTER — Inpatient Hospital Stay (HOSPITAL_COMMUNITY): Payer: Self-pay | Admitting: Occupational Therapy

## 2017-10-25 DIAGNOSIS — E1165 Type 2 diabetes mellitus with hyperglycemia: Secondary | ICD-10-CM

## 2017-10-25 DIAGNOSIS — S143XXS Injury of brachial plexus, sequela: Secondary | ICD-10-CM

## 2017-10-25 LAB — GLUCOSE, CAPILLARY
GLUCOSE-CAPILLARY: 170 mg/dL — AB (ref 70–99)
GLUCOSE-CAPILLARY: 238 mg/dL — AB (ref 70–99)
Glucose-Capillary: 217 mg/dL — ABNORMAL HIGH (ref 70–99)
Glucose-Capillary: 173 mg/dL — ABNORMAL HIGH (ref 70–99)

## 2017-10-25 MED ORDER — INSULIN GLARGINE 100 UNIT/ML ~~LOC~~ SOLN
10.0000 [IU] | Freq: Every day | SUBCUTANEOUS | Status: DC
  Administered 2017-10-25 – 2017-10-29 (×5): 10 [IU] via SUBCUTANEOUS
  Filled 2017-10-25 (×6): qty 0.1
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
## 2017-10-25 NOTE — Progress Notes (Signed)
Physical Therapy Session Note  Patient Details  Name: Casey Collins MRN: [id number] Date of Birth: 28-Jul-1935  Today's Date: 10/25/2017 PT Individual Time: 1300-1415 PT Individual Time Calculation (min): 75 min   Short Term Goals: Week 1:  PT Short Term Goal 1 (Week 1): Pt will transfer bed<>chair w/ mod assist PT Short Term Goal 2 (Week 1): Pt will self-propel w/c 50' w/ min assist PT Short Term Goal 3 (Week 1): Pt will perform sit<>stand transfer w/ min assist PT Short Term Goal 4 (Week 1): Pt will ambulate 16' w/ mod assist w/ LRAD  Skilled Therapeutic Interventions/Progress Updates:   Pt received supine in bed and agreeable to PT. Supine>sit transfer with min assist from PT and min cues for improve sequencing.   Squat pivot transfer to the R with mod assist and moderate cues for set up and sequencing from PT. Pt transported to rehab gym in [facility]. BP taken in sitting : 137/74  PT isntructed pt in sit<>stand from [facility] with min assist with moderate cues to push from [facility] on the R and position LUE in hand splint Gait training with RW and L hand splint 59f x 2 with mod assist to prevent R lateral LOB. Pt reports mild dizziness on second bout. BP taken in standing. 132/111. Pt allowed to rest 3 min and re-assessed in standing. 122/103. Pt returned to sitting and BP assessed 115/63.   WC mobility instructed by PT x 1277fwith min assist and moderate cues for improved technique to control direction. Pt required to utilized BLE and the RUE to propel WC with various amounts of success to maintain straight path based on attention level.   Patient returned to room and [facility] reach and all needs met.            Therapy Documentation Precautions:  Precautions Precautions: Fall Precaution Comments: left upper arm wound, increased pain with elbow AROM and strength Restrictions Weight Bearing Restrictions: No Vital Signs: Therapy Vitals Temp: 98.4 F (36.9  C) Temp Source: Oral Pulse Rate: (!) 51 Resp: 16 BP: 122/62 Patient Position (if appropriate): Sitting Oxygen Therapy SpO2: 96 % O2 Device: Room Air Pain: Pain Assessment Pain Scale: 0-10 Faces Pain Scale: Hurts a little bit Pain Type: Acute pain Pain Location: Arm Pain Orientation: Left Pain Descriptors / Indicators: Grimacing Pain Onset: Gradual Pain Intervention(s): Other (Comment)(managed during therapy) Multiple Pain Sites: No   See Function Navigator for Current Functional Status.   Therapy/Group: Individual Therapy  AuLorie Phenix/28/2019, 5:17 PM

## 2017-10-26 ENCOUNTER — Inpatient Hospital Stay (HOSPITAL_COMMUNITY): Payer: Self-pay | Admitting: Occupational Therapy

## 2017-10-26 LAB — GLUCOSE, CAPILLARY
GLUCOSE-CAPILLARY: 193 mg/dL — AB (ref 70–99)
GLUCOSE-CAPILLARY: 253 mg/dL — AB (ref 70–99)
Glucose-Capillary: 191 mg/dL — ABNORMAL HIGH (ref 70–99)
Glucose-Capillary: 189 mg/dL — ABNORMAL HIGH (ref 70–99)

## 2017-10-26 NOTE — Progress Notes (Signed)
Occupational Therapy Session Note  Patient Details  Name: Casey Collins MRN: [id number] Date of Birth: 13-Jan-1936  Today's Date: 10/26/2017 OT Individual Time: 1410-1509 OT Individual Time Calculation (min): 59 min   Short Term Goals: Week 1:  OT Short Term Goal 1 (Week 1): Pt will complete all bathing sit to stand with no more than mod assist for 3 consecutive sessions.  OT Short Term Goal 2 (Week 1): Pt will donn pullover shirt with supervision 2/3 sessions. OT Short Term Goal 3 (Week 1): Pt will complete LB dressing with mod assist for sit to stand 2/3 sessions. OT Short Term Goal 4 (Week 1): Pt will perform stand pivot toilet transfer with mod assist using RW with hand splint on the left side.  OT Short Term Goal 5 (Week 1): Pt will increase digit flexion to at least 40% of full AROM for greater use with all selfcare tasks  Skilled Therapeutic Interventions/Progress Updates:    Pt greeted in bed, asleep, easily woken and motivated to shower. Supine<sit completed with Max A, however pt able to bring B LEs to EOB and initiate pushing up when in logroll position. Min A sit<stand in [place] from elevated bed. After doffing shirt (with 1 cue to initiate!), he bathed while seated on TTB. Lt arm covered. Tx focus on initiation, alertness, sustained attention, and following 1 step instruction. He required cues for sequencing throughout, as he perseverated on washing head. Able to improve upright trunk alignment with cuing, however pt still very kyphotic. Also exhibited Lt lean when fatigued. Able to follow 1 step instruction with min vcs. Sit<stand from TTB completed with Mod A for power up. Posterior perihygiene  completed sit<stand from high perch position in [place]. Cued pt to use mirror for visual feedback to improve midline orientation. He was able to tolerate L UE weightbearing 50% of time while using [place], but would remove it from device when pain increased. Then he was returned to bed with  Mod A for sit<supine. Pt repositioned himself in bed with instruction. At end of session he was left with all needs within reach and bed alarm set.   Therapy Documentation Precautions:  Precautions Precautions: Fall Precaution Comments: left upper arm wound, increased pain with elbow AROM and strength Restrictions Weight Bearing Restrictions: No Vital Signs: Therapy Vitals Temp: 98.2 F (36.8 C) Temp Source: Oral Pulse Rate: (!) 56 Resp: 17 BP: (!) 107/52 Patient Position (if appropriate): Lying Oxygen Therapy SpO2: 98 % O2 Device: Room Air Pain: Pt grimacing with increased L UE use. Per pt, pain subsided at rest   ADL: ADL Grooming: Minimal assistance, Minimal cueing Where Assessed-Grooming: Sitting at sink Upper Body Bathing: Minimal assistance Lower Body Bathing: Maximal assistance, Minimal cueing Where Assessed-Lower Body Bathing: Standing at sink, Sitting at sink Upper Body Dressing: Moderate assistance, Minimal cueing Where Assessed-Upper Body Dressing: Sitting at sink Lower Body Dressing: Maximal assistance Where Assessed-Lower Body Dressing: Sitting at sink Tub/Shower Transfer: Not assessed     See Function Navigator for Current Functional Status.   Therapy/Group: Individual Therapy  Casey Collins 10/26/2017, 4:51 PM
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
## 2017-10-27 ENCOUNTER — Encounter (HOSPITAL_COMMUNITY): Payer: Self-pay | Admitting: Psychology

## 2017-10-27 ENCOUNTER — Inpatient Hospital Stay (HOSPITAL_COMMUNITY): Payer: Self-pay | Admitting: Speech Pathology

## 2017-10-27 ENCOUNTER — Inpatient Hospital Stay (HOSPITAL_COMMUNITY): Payer: Self-pay

## 2017-10-27 ENCOUNTER — Inpatient Hospital Stay (HOSPITAL_COMMUNITY): Payer: Self-pay | Admitting: Occupational Therapy

## 2017-10-27 DIAGNOSIS — I69320 Aphasia following cerebral infarction: Secondary | ICD-10-CM

## 2017-10-27 LAB — GLUCOSE, CAPILLARY
GLUCOSE-CAPILLARY: 171 mg/dL — AB (ref 70–99)
GLUCOSE-CAPILLARY: 229 mg/dL — AB (ref 70–99)
Glucose-Capillary: 154 mg/dL — ABNORMAL HIGH (ref 70–99)
Glucose-Capillary: 125 mg/dL — ABNORMAL HIGH (ref 70–99)

## 2017-10-27 NOTE — Progress Notes (Signed)
Occupational Therapy Session Note  Patient Details  Name: Casey Collins MRN: [id number] Date of Birth: 15-Jul-1935  Today's Date: 10/27/2017 OT Individual Time: 1791-5056 OT Individual Time Calculation (min): 26 min    Short Term Goals: Week 1:  OT Short Term Goal 1 (Week 1): Pt will complete all bathing sit to stand with no more than mod assist for 3 consecutive sessions.  OT Short Term Goal 2 (Week 1): Pt will donn pullover shirt with supervision 2/3 sessions. OT Short Term Goal 3 (Week 1): Pt will complete LB dressing with mod assist for sit to stand 2/3 sessions. OT Short Term Goal 4 (Week 1): Pt will perform stand pivot toilet transfer with mod assist using RW with hand splint on the left side.  OT Short Term Goal 5 (Week 1): Pt will increase digit flexion to at least 40% of full AROM for greater use with all selfcare tasks  Skilled Therapeutic Interventions/Progress Updates:    Pt received in bed, asleep; very difficult to arouse.  Therapist modified environment by turning on lights and attempted to arouse pt with cool cloth to face.  Pt opened eyes to stimulus but quickly closed eyes and returned to sleep.  Encouraged hand over hand to wash face, with pt able to wash Rt side of face after therapist assisted with initiation, however pt did not cross midline to wash Lt side of face.  Engaged in rolling with pt able to roll to Rt with assist to reach across body with LUE.  Therapist placed LUE on bed rail to encourage pt to assist with rolling.  Pt continued with decreased participation and arousal.  Therefore left in sidelying (pt able to communicate comfort in sidelying), therapist positioned to allow support for LUE.  Therapy Documentation Precautions:  Precautions Precautions: Fall Precaution Comments: left upper arm wound, increased pain with elbow AROM and strength Restrictions Weight Bearing Restrictions: No General:   Vital Signs: Therapy Vitals Temp: 97.9 F (36.6  C) Pulse Rate: (!) 59 Resp: 16 BP: 130/60 Patient Position (if appropriate): Lying Oxygen Therapy O2 Device: Room Air Pain:  Pt with no c/o pain  See Function Navigator for Current Functional Status.   Therapy/Group: Individual Therapy  Simonne Come 10/27/2017, 4:02 PM
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
## 2017-10-27 NOTE — Progress Notes (Signed)
Physical Therapy Session Note  Patient Details  Name: Casey Collins MRN: [id number] Date of Birth: 09/22/1935  Today's Date: 10/27/2017 PT Individual Time: 1100-1200  And 1331-1415 PT Individual Time Calculation (min): 60 min and 44 min   Short Term Goals: Week 1:  PT Short Term Goal 1 (Week 1): Pt will transfer bed<>chair w/ mod assist PT Short Term Goal 2 (Week 1): Pt will self-propel w/c 50' w/ min assist PT Short Term Goal 3 (Week 1): Pt will perform sit<>stand transfer w/ min assist PT Short Term Goal 4 (Week 1): Pt will ambulate 59' w/ mod assist w/ LRAD  Skilled Therapeutic Interventions/Progress Updates:    Session 1: Pt seated in w/c upon PT arrival, agreeable to therapy tx and denies pain. Pt transported to the gym. Pt performed squat pivot transfers x 2 with mod assist and increased time to complete, verbal cues for techniques and manual facilitation for weightshift. Pt worked on lateral scooting on the mat with emphasis on anterior weightshift to offweight hips. Pt performed x 6 sit<>stands from the mat with RW and min assist, verbal cues for techniques. In standing pt worked on pre-gait stepping forward/back with each LE, RW for UE support, mod assist for dynamic balance. Pt with difficulty stepping forward. Therapist added external cue of stepping to blue tape, pt performed pre gait while stepping to tape with RW for UE support, min-mod assist. Pt reports feeling weak. Pt performed squat pivot back to w/c with mod assist and increased time. Vitals monitored: BP 86/57. Pt transported back to room and transferred to bed with mod assist. Pt transferred to supine with max assist, left in care of NT. RN made aware of low BP this session.   Session 2:  Pt supine in bed upon PT arrival, agreeable to therapy tx and reports L UE pain but unable to rate. BP in supine 129/67. Pt transferred from supine>sitting EOB with mod assist, verbal cues for techniques. Pt performed lateral  scooting to the L to scoot up in bed. Pt's BP in sitting 116/64. Pt seated EOB worked on seated balance while performing upper and lower body dressing, min assist to don shirt, total assist to pull pants over hips in standing. Pt performed sit<>stand from EOB with mod assist, verbal cues for upright posture. Pt reports feeling weak in standing and sits back down. Pt performed sit<>stand with mod assist, therapist checking BP in standing for orthostatics, BP 78/55. Pt transferred back to supine with mod assist, RN made aware of drop in BP. Therapist donned teds bilaterally total assist and left pt supine in bed with needs in reach and bed alarm set.   Therapy Documentation Precautions:  Precautions Precautions: Fall Precaution Comments: left upper arm wound, increased pain with elbow AROM and strength Restrictions Weight Bearing Restrictions: No   See Function Navigator for Current Functional Status.   Therapy/Group: Individual Therapy  Netta Corrigan, PT, DPT 10/27/2017, 8:00 AM
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
## 2017-10-27 NOTE — Progress Notes (Signed)
Speech Language Pathology Daily Session Note  Patient Details  Name: Ugochukwu Chichester MRN: [id number] Date of Birth: 12/25/1935  Today's Date: 10/27/2017 SLP Individual Time: 8676-1950 SLP Individual Time Calculation (min): 55 min  Short Term Goals: Week 1: SLP Short Term Goal 1 (Week 1): Patient will consume current diet with minimal overt s/s of aspiraton with Mod A verbal cues for use of swallowing compensatory strategies.  SLP Short Term Goal 2 (Week 1): Patient will consume trials of honey-thick liquids via cup without overt s/s of aspiration with Mod A verbal cues for use of swallowing strategies over 2 sessions prior to upgrade.  SLP Short Term Goal 3 (Week 1): Patient will consume trials of Dys. 2 textures with effeicient mastication and minimal oral residue with minimal overt s/s of aspiration with Mod A verbal cues over 2 sessions prior to upgrade.  SLP Short Term Goal 4 (Week 1): Patient will utilize external aids to orient to place, time and situation with Mod A verbal cues.  SLP Short Term Goal 5 (Week 1): Patient will demonstrate sustained attention to functional tasks for 5 minutes with Mod A verbal cues.  SLP Short Term Goal 6 (Week 1): Patient will utilize an increased vocal intensity to be ~50% intelligibile at the phrase level with Max A verbal cues.   Skilled Therapeutic Interventions: Skilled treatment session focused on cognitive and dysphagia goals. Upon arrival, patient was asleep while supine in bed. Patient had been incontinent of urine and was total A for hygiene. SLP facilitated session by providing Max verbal cues for alertness. In order to improve arousal, patient was transferred to the wheelchair via the [facility] with +2 assist for safety with Mod A verbal cues needed for sequencing with task. While upright in the chair, patient requested to use the urinal but was unable to void despite extra time. RN aware. Patient was independently oriented to place but asked  clinician in regards to orientation to time. Throughout the session, patient required Mod A verbal cues for use of an increased vocal intensity at the phrase level to achieve ~75% intelligibility and for overall selective attention to tasks in a minimally distracting environment.  Patient consumed trials of honey-thick liquids via cup with Mod A verbal cues needed for use of small sips with a suspected delay in swallow initiation and delayed coughing noted. Coughing was eliminated with sips via tsp. Recommend patient continue trials via cup with SLP only. Patient left upright in wheelchair with alarm on and all needs within reach. Continue with current plan of care.      Function:  Eating Eating   Modified Consistency Diet: Yes Eating Assist Level: Set up assist for;Supervision or verbal cues;Helper checks for pocketed food;Help managing cup/glass   Eating Set Up Assist For: Opening containers       Cognition Comprehension Comprehension assist level: Understands basic 75 - 89% of the time/ requires cueing 10 - 24% of the time  Expression   Expression assist level: Expresses basic 50 - 74% of the time/requires cueing 25 - 49% of the time. Needs to repeat parts of sentences.  Social Interaction Social Interaction assist level: Interacts appropriately 75 - 89% of the time - Needs redirection for appropriate language or to initiate interaction.  Problem Solving Problem solving assist level: Solves basic 25 - 49% of the time - needs direction more than half the time to initiate, plan or complete simple activities  Memory Memory assist level: Recognizes or recalls 25 - 49% of  the time/requires cueing 50 - 75% of the time    Pain No/Denies Pain   Therapy/Group: Individual Therapy  Challis Crill 10/27/2017, 12:38 PM

## 2017-10-28 ENCOUNTER — Inpatient Hospital Stay (HOSPITAL_COMMUNITY): Payer: Self-pay

## 2017-10-28 ENCOUNTER — Inpatient Hospital Stay (HOSPITAL_COMMUNITY): Payer: Self-pay | Admitting: Physical Therapy

## 2017-10-28 ENCOUNTER — Inpatient Hospital Stay (HOSPITAL_COMMUNITY): Payer: Medicare Other

## 2017-10-28 ENCOUNTER — Inpatient Hospital Stay (HOSPITAL_COMMUNITY): Payer: Self-pay | Admitting: Occupational Therapy

## 2017-10-28 LAB — BASIC METABOLIC PANEL
Anion gap: 7 (ref 5–15)
BUN: 24 mg/dL — AB (ref 8–23)
CALCIUM: 8.9 mg/dL (ref 8.9–10.3)
CO2: 27 mmol/L (ref 22–32)
CREATININE: 1.51 mg/dL — AB (ref 0.61–1.24)
Chloride: 100 mmol/L (ref 98–111)
GFR calc Af Amer: 49 mL/min — ABNORMAL LOW (ref >60)
GFR, EST NON AFRICAN AMERICAN: 42 mL/min — AB (ref >60)
Glucose, Bld: 242 mg/dL — ABNORMAL HIGH (ref 70–99)
Potassium: 5 mmol/L (ref 3.5–5.1)
Sodium: 134 mmol/L — ABNORMAL LOW (ref 135–145)

## 2017-10-28 LAB — GLUCOSE, CAPILLARY
GLUCOSE-CAPILLARY: 133 mg/dL — AB (ref 70–99)
Glucose-Capillary: 210 mg/dL — ABNORMAL HIGH (ref 70–99)
Glucose-Capillary: 229 mg/dL — ABNORMAL HIGH (ref 70–99)
Glucose-Capillary: 186 mg/dL — ABNORMAL HIGH (ref 70–99)

## 2017-10-28 MED ORDER — SODIUM CHLORIDE 0.45 % IV SOLN
INTRAVENOUS | Status: DC
  Administered 2017-10-28: 18:00:00 via INTRAVENOUS
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
## 2017-10-28 NOTE — Progress Notes (Addendum)
Physical Therapy Session Note  Patient Details  Name: Casey Collins MRN: [id number] Date of Birth: August 29, 1935  Today's Date: 10/28/2017 PT Individual Time: 1100-1130 and 1400-1440 PT Individual Time Calculation (min): 30 min and 40 min   Missed time: 30 minutes (low blood pressure)  Short Term Goals: Week 1:  PT Short Term Goal 1 (Week 1): Pt will transfer bed<>chair w/ mod assist PT Short Term Goal 2 (Week 1): Pt will self-propel w/c 50' w/ min assist PT Short Term Goal 3 (Week 1): Pt will perform sit<>stand transfer w/ min assist PT Short Term Goal 4 (Week 1): Pt will ambulate 43' w/ mod assist w/ LRAD  Skilled Therapeutic Interventions/Progress Updates:    Session 1: Pt seated in w/c upon PT arrival, son in law Tim present and requesting to speak to RN about pt's medications. Pt appears fatigued and lethargic however still able to follow commands and respond to therapists questions. Pt's blood pressure monitored in sitting- 78/40 with teds donned. RN and PA made aware of pt's low BP. PA suggested getting pt to bed and ordered stat labs. Pt transferred from w/c>bed stand pivot with mod assist. Pt transferred to supine with mod assist. Pt performed bridge with LEs in order to scoot up in bed min assist. Pt left supine in bed with pillow positioned under L UE and in care of RN, bed alarm set.  Pt missed 30 minutes of skilled therapy tx this session secondary to low BP.   Session 2: Pt supine in bed upon PT arrival, agreeable to therapy tx and does not report pain. Pt performed bridging in order to pull down pants, pt incontinent of bladder, changed brief total assist with rolling in both directions min assist to don clean brief and clean peri area. Pt transferred supine>sitting EOB with min assist. Pt unable to maintain static sitting balance this session with lateral lean to the L and posterior lean require max verbal cueing to correct, continues to require mod-max assist for sitting  balance. BP 127/52 in sitting. Pt performed sit<>stand with mod assist from EOB, continues to demonstrate L lateral lean with mod assist for standing balance. Pt remains lethargic this session with difficulty following commands/cues to correct sitting balance. Pt transferred to supine with mod assist and left supine with needs in reach and bed alarm set.     Therapy Documentation Precautions:  Precautions Precautions: Fall Precaution Comments: left upper arm wound, increased pain with elbow AROM and strength Restrictions Weight Bearing Restrictions: No   See Function Navigator for Current Functional Status.   Therapy/Group: Individual Therapy  Netta Corrigan, PT, DPT 10/28/2017, 7:50 AM
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
## 2017-10-28 NOTE — Progress Notes (Signed)
Orthopedic Tech Progress Note Patient Details:  Casey Collins April 13, 1935 [id number]  Ortho Devices Type of Ortho Device: Velcro wrist splint Ortho Device/Splint Location: left   Post Interventions Patient Tolerated: Well Instructions Provided: Care of device   [name] 10/28/2017, 11:36 AM

## 2017-10-28 NOTE — Progress Notes (Signed)
Speech Language Pathology Daily Session Note  Patient Details  Name: Casey Collins MRN: [id number] Date of Birth: [birth year]  Today's Date: 10/28/2017 SLP Individual Time: 1572-6203 SLP Individual Time Calculation (min): 21 min  Short Term Goals: Week 1: SLP Short Term Goal 1 (Week 1): Patient will consume current diet with minimal overt s/s of aspiraton with Mod A verbal cues for use of swallowing compensatory strategies.  SLP Short Term Goal 2 (Week 1): Patient will consume trials of honey-thick liquids via cup without overt s/s of aspiration with Mod A verbal cues for use of swallowing strategies over 2 sessions prior to upgrade.  SLP Short Term Goal 3 (Week 1): Patient will consume trials of Dys. 2 textures with effeicient mastication and minimal oral residue with minimal overt s/s of aspiration with Mod A verbal cues over 2 sessions prior to upgrade.  SLP Short Term Goal 4 (Week 1): Patient will utilize external aids to orient to place, time and situation with Mod A verbal cues.  SLP Short Term Goal 5 (Week 1): Patient will demonstrate sustained attention to functional tasks for 5 minutes with Mod A verbal cues.  SLP Short Term Goal 6 (Week 1): Patient will utilize an increased vocal intensity to be ~50% intelligibile at the phrase level with Max A verbal cues.   Skilled Therapeutic Interventions:Skilled ST services focused on swallow and cognitive skills. Pt missed 24 minutes of therapy time due to being out of room for x-ray. SLP facilitated orientation skills, pt was orientated to place and required max A verbal cues for time. SLP created visual aid to assist in orientation to time, however pt was not able able to comprehend message at this time. SLP facilitated basic knowledge skills recalling months of the year in order, pt required mod A cues. Pt demonstrated sustained attention in 7 minute intervals with min A verbal cues. SLP facilitated oral care piror to HTL via cup trials, pt  feed self and demonstrated 1 delayed cough during the consumption of 3 oz. Pt required max A verbal cues to increase speech intelligibility at phrase level due to reduce vocal intensity. Pt was left in room with call bell within reach and bed alaram set. SLP reccomends to continue skilled services.      Pain Pain Assessment Pain Score: 0-No pain  Therapy/Group: Individual Therapy  Donnivan Villena  [facility] 10/28/2017, 12:26 PM

## 2017-10-29 ENCOUNTER — Inpatient Hospital Stay (HOSPITAL_COMMUNITY): Payer: Self-pay

## 2017-10-29 ENCOUNTER — Inpatient Hospital Stay (HOSPITAL_COMMUNITY): Payer: Self-pay | Admitting: Occupational Therapy

## 2017-10-29 ENCOUNTER — Inpatient Hospital Stay (HOSPITAL_COMMUNITY): Payer: Self-pay | Admitting: Speech Pathology

## 2017-10-29 LAB — URINALYSIS, ROUTINE W REFLEX MICROSCOPIC
BILIRUBIN URINE: NEGATIVE
Glucose, UA: NEGATIVE mg/dL
Ketones, ur: NEGATIVE mg/dL
Nitrite: POSITIVE — AB
PROTEIN: 30 mg/dL — AB
SPECIFIC GRAVITY, URINE: 1.012 (ref 1.005–1.030)
WBC, UA: >50 WBC/hpf — ABNORMAL HIGH (ref 0–5)
pH: 6 (ref 5.0–8.0)

## 2017-10-29 LAB — CBC WITH DIFFERENTIAL/PLATELET
Abs Immature Granulocytes: 0.1 10*3/uL (ref 0.0–0.1)
Basophils Absolute: 0 10*3/uL (ref 0.0–0.1)
Basophils Relative: 0 %
EOS ABS: 0.1 10*3/uL (ref 0.0–0.7)
EOS PCT: 1 %
HEMATOCRIT: 38.8 % — AB (ref 39.0–52.0)
Hemoglobin: 12.1 g/dL — ABNORMAL LOW (ref 13.0–17.0)
Immature Granulocytes: 1 %
LYMPHS ABS: 1.1 10*3/uL (ref 0.7–4.0)
Lymphocytes Relative: 9 %
MCH: 29.8 pg (ref 26.0–34.0)
MCHC: 31.2 g/dL (ref 30.0–36.0)
MCV: 95.6 fL (ref 78.0–100.0)
MONOS PCT: 13 %
Monocytes Absolute: 1.7 10*3/uL — ABNORMAL HIGH (ref 0.1–1.0)
NEUTROS PCT: 76 %
Neutro Abs: 9.5 10*3/uL — ABNORMAL HIGH (ref 1.7–7.7)
Platelets: 410 10*3/uL — ABNORMAL HIGH (ref 150–400)
RBC: 4.06 MIL/uL — ABNORMAL LOW (ref 4.22–5.81)
RDW: 14.1 % (ref 11.5–15.5)
WBC: 12.4 10*3/uL — ABNORMAL HIGH (ref 4.0–10.5)

## 2017-10-29 LAB — GLUCOSE, CAPILLARY
GLUCOSE-CAPILLARY: 230 mg/dL — AB (ref 70–99)
GLUCOSE-CAPILLARY: 143 mg/dL — AB (ref 70–99)
Glucose-Capillary: 147 mg/dL — ABNORMAL HIGH (ref 70–99)
Glucose-Capillary: 234 mg/dL — ABNORMAL HIGH (ref 70–99)

## 2017-10-29 LAB — CREATININE, SERUM
CREATININE: 1.32 mg/dL — AB (ref 0.61–1.24)
GFR, EST AFRICAN AMERICAN: 57 mL/min — AB (ref >60)
GFR, EST NON AFRICAN AMERICAN: 49 mL/min — AB (ref >60)

## 2017-10-29 MED ORDER — RAMIPRIL 10 MG PO CAPS: 10.0000 mg | ORAL_CAPSULE | Freq: Every day | ORAL | Status: DC

## 2017-10-29 MED ORDER — RAMIPRIL 5 MG PO CAPS
5.0000 mg | ORAL_CAPSULE | Freq: Every day | ORAL | Status: DC
  Administered 2017-10-30 – 2017-11-08 (×9): 5 mg via ORAL
  Filled 2017-10-29 (×10): qty 1
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
## 2017-10-29 NOTE — Progress Notes (Signed)
Speech Language Pathology Weekly Progress and Session Note  Patient Details  Name: Casey Collins MRN: [id number] Date of Birth: 1935-12-22  Beginning of progress report period: October 22, 2017 End of progress report period: October 29, 2017  Today's Date: 10/29/2017 SLP Individual Time: 7353-2992 SLP Individual Time Calculation (min): 60 min (30 minutes of which were make-up minutes)  Short Term Goals: Week 1: SLP Short Term Goal 1 (Week 1): Patient will consume current diet with minimal overt s/s of aspiraton with Mod A verbal cues for use of swallowing compensatory strategies.  SLP Short Term Goal 1 - Progress (Week 1): Not met SLP Short Term Goal 2 (Week 1): Patient will consume trials of honey-thick liquids via cup without overt s/s of aspiration with Mod A verbal cues for use of swallowing strategies over 2 sessions prior to upgrade.  SLP Short Term Goal 2 - Progress (Week 1): Not met SLP Short Term Goal 3 (Week 1): Patient will consume trials of Dys. 2 textures with effeicient mastication and minimal oral residue with minimal overt s/s of aspiration with Mod A verbal cues over 2 sessions prior to upgrade.  SLP Short Term Goal 3 - Progress (Week 1): Not met SLP Short Term Goal 4 (Week 1): Patient will utilize external aids to orient to place, time and situation with Mod A verbal cues.  SLP Short Term Goal 4 - Progress (Week 1): Not met SLP Short Term Goal 5 (Week 1): Patient will demonstrate sustained attention to functional tasks for 5 minutes with Mod A verbal cues.  SLP Short Term Goal 5 - Progress (Week 1): Not met SLP Short Term Goal 6 (Week 1): Patient will utilize an increased vocal intensity to be ~50% intelligibile at the phrase level with Max A verbal cues.  SLP Short Term Goal 6 - Progress (Week 1): Met    New Short Term Goals: Week 2: SLP Short Term Goal 1 (Week 2): Patient will consume current diet with minimal overt s/s of aspiraton with Mod A verbal cues for  use of swallowing compensatory strategies.  SLP Short Term Goal 2 (Week 2): Patient will consume trials of honey-thick liquids via cup without overt s/s of aspiration with Mod A verbal cues for use of swallowing strategies over 2 sessions prior to upgrade.  SLP Short Term Goal 3 (Week 2): Patient will consume trials of Dys. 2 textures with effeicient mastication and minimal oral residue with minimal overt s/s of aspiration with Mod A verbal cues over 2 sessions prior to upgrade.  SLP Short Term Goal 4 (Week 2): Patient will utilize external aids to orient to place, time and situation with Mod A verbal cues.  SLP Short Term Goal 5 (Week 2): Patient will demonstrate sustained attention to functional tasks for 5 minutes with Mod A verbal cues.  SLP Short Term Goal 6 (Week 2): Patient will utilize an increased vocal intensity to be ~50% intelligibile at the phrase level with Max A verbal cues.   Weekly Progress Updates: Pt with slow progress this reporting period and a s a result he has met 1 of 6 STGs. With Max A cues, he has demonstrated ability to increase vocal intensity to achieve ~75% speech intelligibility at the phrase level. Overall, pt continues to require Max A for sustained attention, use of compensatory swallow strategies, and use of external aids for orientation. Pt continues to demonstrate overt s/s of aspiration on current diet and with cups sips of honey thick liquids. Given baseline chronic  dysphagia, prognosis for safe advancement is guarded at this time. Skilled ST continues to be indicated to target the above mentioned deficits and assess if diet can be advanced as well as provide education to family for discharge planning. Anticipate that pt will require 24 hour supervision and potentially follow up [place].       Intensity: Minumum of 1-2 x/day, 30 to 90 minutes Frequency: 3 to 5 out of 7 days Duration/Length of Stay: 14-18 days Treatment/Interventions: Cognitive  remediation/compensation;Environmental controls;Internal/external aids;Speech/Language facilitation;Therapeutic Activities;Patient/family education;Functional tasks;Dysphagia/aspiration precaution training;Cueing hierarchy   Daily Session  Skilled Therapeutic Interventions: Skilled treatment session focused on cognition and dysphagia goals. SLP received pt upright in bed with breakfast tray of puree and honey thick liquids just presented. SLP facilitated session by providing continuous cues to attend to spoon during self-feeding. Pt was frequently distracted by non-distracting objects in room. SLP provided skilled observation of pt consuming honey thick liquids via single cup sips. Pt requires Max A cues for single sip with 1 instance of throat clearing with consumption via cup. Pt's speech intelligibility was improved d/t increased vocal intensity ~ 75% at the phrase level. Pt was left upright in bed, bed alarm on and all needs within reach.      General    Pain Pain Assessment Faces Pain Scale: Hurts a little bit Pain Location: Arm Pain Orientation: Left Pain Descriptors / Indicators: Aching Pain Onset: Gradual Pain Intervention(s): Other (Comment)(managed during session)  Therapy/Group: Individual Therapy  Krisy Dix 10/29/2017, 10:16 AM
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
## 2017-10-29 NOTE — Progress Notes (Signed)
Occupational Therapy Session Note  Patient Details  Name: Casey Collins MRN: [id number] Date of Birth: 13-Aug-1935  Today's Date: 10/29/2017 OT Individual Time: 7416-3845 OT Individual Time Calculation (min): 17 min  Today's Date: 10/29/2017 OT Missed Time: 40 Minutes Missed Time Reason: Patient fatigue   Short Term Goals: Week 1:  OT Short Term Goal 1 (Week 1): Pt will complete all bathing sit to stand with no more than mod assist for 3 consecutive sessions.  OT Short Term Goal 2 (Week 1): Pt will donn pullover shirt with supervision 2/3 sessions. OT Short Term Goal 3 (Week 1): Pt will complete LB dressing with mod assist for sit to stand 2/3 sessions. OT Short Term Goal 4 (Week 1): Pt will perform stand pivot toilet transfer with mod assist using RW with hand splint on the left side.  OT Short Term Goal 5 (Week 1): Pt will increase digit flexion to at least 40% of full AROM for greater use with all selfcare tasks  Skilled Therapeutic Interventions/Progress Updates:    Upon entering the room, pt supine in bed and sleeping soundly. RN present to give medications. Pt does not move or acknowledge RN as she gives shot. Supine >sit on EOB with total A. Pt seated with max - total A for static sitting balance secondary to lethargy. Pt's eyes open and RN giving medication by mouth. Pt then closing eyes and becoming solid total A to remain on EOB. Pt returned to supine and positioned in bed for comfort. Bed alarm activated and call bell within reach. 40 missed minutes secondary the lethargy.   Therapy Documentation Precautions:  Precautions Precautions: Fall Precaution Comments: left upper arm wound, increased pain with elbow AROM and strength Restrictions Weight Bearing Restrictions: No General: General OT Amount of Missed Time: 40 Minutes PT Missed Treatment Reason: Patient fatigue(lethergic) Vital Signs: Therapy Vitals Pulse Rate: 61 Resp: 18 BP: 127/73 Patient Position (if  appropriate): Lying Oxygen Therapy SpO2: 98 % O2 Device: Room Air Pain:   ADL: ADL Grooming: Minimal assistance, Minimal cueing Where Assessed-Grooming: Sitting at sink Upper Body Bathing: Minimal assistance Lower Body Bathing: Maximal assistance, Minimal cueing Where Assessed-Lower Body Bathing: Standing at sink, Sitting at sink Upper Body Dressing: Moderate assistance, Minimal cueing Where Assessed-Upper Body Dressing: Sitting at sink Lower Body Dressing: Maximal assistance Where Assessed-Lower Body Dressing: Sitting at sink Tub/Shower Transfer: Not assessed   Therapy/Group: Individual Therapy  Gypsy Decant 10/29/2017, 2:25 PM

## 2017-10-29 NOTE — Progress Notes (Signed)
Patient ID: Casey Collins, male   DOB: January 24, 1936, 82 y.o.   MRN: [id number] Jodell Cipro removed from left upper arm today.  Moderate swelling.  Remains confused.  He is able to raise his arm.  2+ brachial and radial pulse.  Complete healing of exit site and repair site.  Will not follow actively in the hospital.  Please notify us if we can provide further assistance.  I will arrange outpatient follow-up to see me and 4 to 6 weeks
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
## 2017-10-29 NOTE — Progress Notes (Signed)
Occupational Therapy Session Note  Patient Details  Name: Casey Collins MRN: [id number] Date of Birth: [birth year]  Today's Date: 10/29/2017 OT Individual Time: 1102-1200 OT Individual Time Calculation (min): 58 min    Short Term Goals: Week 1:  OT Short Term Goal 1 (Week 1): Pt will complete all bathing sit to stand with no more than mod assist for 3 consecutive sessions.  OT Short Term Goal 2 (Week 1): Pt will donn pullover shirt with supervision 2/3 sessions. OT Short Term Goal 3 (Week 1): Pt will complete LB dressing with mod assist for sit to stand 2/3 sessions. OT Short Term Goal 4 (Week 1): Pt will perform stand pivot toilet transfer with mod assist using RW with hand splint on the left side.  OT Short Term Goal 5 (Week 1): Pt will increase digit flexion to at least 40% of full AROM for greater use with all selfcare tasks  Skilled Therapeutic Interventions/Progress Updates:    Pt completed supine to sit EOB with mod assist.  Once sitting he needed max assist to sustain static sitting balance secondary to LOB to the left and lean to the left.  Next, had him complete stand pivot transfer to the wheelchair with mod assist.  Took him over to the sink for bathing and dressing task.  Secondary to time therapist set him up with washcloth with soap and had him work on bathing.  He perseverated on washing his face and head four times to start.  Therapist then provided demonstrational cueing at mod assist level to bring his hand down to his left arm and chest to wash.  When washcloth was rinsed out and he was cued to wash his stomach, he instead tried to go back up and wash the right side of his face.  Mod re-direction from therapist.  He completed sit to stand with mod assist and therapist assisted with pushing down pants and brief.  He was able to maintain standing for approximately 1 minute while working on washing his front and back peri area.  The longer he stood the more his knees would  flex, thus going from needing min assist for initial standing balance to mod assist.  Mod assist for threading pants in sitting with max assist for pulling them over his hips in standing.  Pt attempted LUE use throughout session but reported pain in the left wrist with splint removed during bathing.  Mod hand over hand to use the LUE for washing the right arm.  In standing therapist helped position it on the sink for weightbearing while using the LUE for washing.  Finished session with pt in the wheelchair with call button and phone in reach, left wrist splint in place as well as half lap tray.  NT in room as well to assist with lunch.    Therapy Documentation Precautions:  Precautions Precautions: Fall Precaution Comments: left upper arm wound, increased pain with elbow AROM and strength Restrictions Weight Bearing Restrictions: No   Vital Signs: Therapy Vitals Temp: 98.4 F (36.9 C) Temp Source: Oral Pulse Rate: 61 Resp: 18 BP: 141/57 Patient Position (if appropriate): Lying Oxygen Therapy SpO2: 98 % O2 Device: Room Air Pain:  Pt with 4/10 pain in the left wrist on the faces scale.  Repositioned for support with use of wrist cock-up splint ADL: See Care Plan Section for details  Therapy/Group: Individual Therapy  Kyiah Canepa,Bannon OTR/L 10/29/2017, 3:49 PM

## 2017-10-29 NOTE — Progress Notes (Signed)
Physical Therapy Session Note  Patient Details  Name: Casey Collins MRN: [id number] Date of Birth: Dec 29, 1935  Today's Date: 10/29/2017 PT Individual Time: 1000-1040 PT Individual Time Calculation (min): 40 min   Short Term Goals: Week 1:  PT Short Term Goal 1 (Week 1): Pt will transfer bed<>chair w/ mod assist PT Short Term Goal 2 (Week 1): Pt will self-propel w/c 50' w/ min assist PT Short Term Goal 3 (Week 1): Pt will perform sit<>stand transfer w/ min assist PT Short Term Goal 4 (Week 1): Pt will ambulate 59' w/ mod assist w/ LRAD  Skilled Therapeutic Interventions/Progress Updates:    Pt supine in bed upon PT arrival, agreeable to therapy tx and appears lethargic. Therapist assisted pt to don socks, teds and pants total assist and pt bridging to pull pants over hips. Pt transferred supine>sitting EOB with min assist. Pt unable to maintain static sitting balance this session with lateral lean to the L and posterior lean require max verbal cueing to correct, continues to require mod-max assist for sitting balance. Unable to get accurate BP reading because he kept moving his arm. Pt performed sit<>stand with mod assist from EOB, continues to demonstrate L lateral lean with mod assist for standing balance. Pt remains lethargic this session with difficulty following commands/cues to correct sitting balance. Pt transferred to supine with mod assist and left supine with needs in reach and bed alarm set. Pt with labored breathing this session, poor attention and unable to verbally answer questions. Pt missed 20 minutes this session secondary to lethargy.  Therapy Documentation Precautions:  Precautions Precautions: Fall Precaution Comments: left upper arm wound, increased pain with elbow AROM and strength Restrictions Weight Bearing Restrictions: No    Therapy/Group: Individual Therapy  Netta Corrigan, PT, DPT 10/29/2017, 7:56 AM

## 2017-10-30 ENCOUNTER — Inpatient Hospital Stay (HOSPITAL_COMMUNITY): Payer: Self-pay | Admitting: Occupational Therapy

## 2017-10-30 ENCOUNTER — Inpatient Hospital Stay (HOSPITAL_COMMUNITY): Payer: Self-pay | Admitting: Speech Pathology

## 2017-10-30 ENCOUNTER — Inpatient Hospital Stay (HOSPITAL_COMMUNITY): Payer: Self-pay | Admitting: Physical Therapy

## 2017-10-30 LAB — GLUCOSE, CAPILLARY
GLUCOSE-CAPILLARY: 253 mg/dL — AB (ref 70–99)
GLUCOSE-CAPILLARY: 103 mg/dL — AB (ref 70–99)
Glucose-Capillary: 158 mg/dL — ABNORMAL HIGH (ref 70–99)
Glucose-Capillary: 219 mg/dL — ABNORMAL HIGH (ref 70–99)

## 2017-10-30 MED ORDER — ZOLPIDEM TARTRATE 5 MG PO TABS
5.0000 mg | ORAL_TABLET | Freq: Every evening | ORAL | Status: DC | PRN
  Administered 2017-10-30 – 2017-11-06 (×6): 5 mg via ORAL
  Filled 2017-10-30 (×7): qty 1

## 2017-10-30 MED ORDER — CEPHALEXIN 250 MG PO CAPS
250.0000 mg | ORAL_CAPSULE | Freq: Three times a day (TID) | ORAL | Status: DC
  Administered 2017-10-30 – 2017-11-06 (×22): 250 mg via ORAL
  Filled 2017-10-30 (×22): qty 1

## 2017-10-30 MED ORDER — INSULIN GLARGINE 100 UNIT/ML ~~LOC~~ SOLN
10.0000 [IU] | Freq: Two times a day (BID) | SUBCUTANEOUS | Status: DC
  Administered 2017-10-30 – 2017-11-08 (×17): 10 [IU] via SUBCUTANEOUS
  Filled 2017-10-30 (×19): qty 0.1

## 2017-10-30 MED ORDER — SODIUM CHLORIDE 0.45 % IV SOLN
INTRAVENOUS | Status: DC
  Administered 2017-10-30 – 2017-10-31 (×2): via INTRAVENOUS
  Administered 2017-11-01: 1000 mL via INTRAVENOUS
  Administered 2017-11-02 – 2017-11-04 (×3): via INTRAVENOUS

## 2017-10-30 NOTE — Progress Notes (Signed)
Charge nurse called to room by nursing staff to discuss family complaints.  Upon entering room, son-in-law seemed very agitated and unsatisfied with his father-in-laws care here on rehab.  He started expressing his concerns with the patient declining over the last few days.  He also inquired about the patients sleeping medication and asking if some of his actions could be from withdrawal from sleeping meds.  He mentioned the patient getting agitated when in the wheelchair for "prolonged" periods of time and being "strapped down" with the seatbelt alarm. Also asking questions as to why he wasn't allowed to return to bed after therapy this morning. This RN discussed the reason the seatbelt alarm was being used, to prevent falls.  Also reinforced the patients aspiration risk being high given his history of dysphagia and proper body positioning to decrease his risk.  Son-in-law dissatisfied with medical care stating that he was the one who demanded blood work be done because he thought the patient was dehydrated.  Explained urinalysis results to him and that s/s of UTI can be a contributing factor to the patients change in mental status.  Patient all the while resting in bed with eyes closed, did not add anything to conversation.  After extensive discussion, son-in-laws concerns were met to his satisfaction.  Decision has been made for patient to return to bed in between therapy sessions to promote rest as the patient is off his sleep cycle in the hospital.  Will update the team.  Brita Romp, RN

## 2017-10-30 NOTE — Progress Notes (Signed)
Physical Therapy Weekly Progress Note  Patient Details  Name: Casey Collins MRN: [id number] Date of Birth: 1935/09/22  Beginning of progress report period: October 23, 2017 End of progress report period: October 29, 2017  Today's Date: 10/30/2017 PT Individual Time: 6979-4801 AND 1505-1520 PT Individual Time Calculation (min): 25 min AND 15 min PT missed time (calculation): 45 min (lethargy/fatigue)  Patient has met 2 of 4 short term goals. Pt has made slow progress towards LTGs over last week 2/2 decreased OOB tolerance, impaired sleep/wake cycle, and lethargy. When he is alert and well-rested, he is performing OOB activity w/ min assist including transfers and giat w/ RW up to 40'. Motor planning and sequencing are also improved when he is well-rested. However, he is most often limited by lethargy and fatigue, making OOB activity unsafe. Patient continues to demonstrate the following deficits muscle weakness, decreased cardiorespiratoy endurance, impaired timing and sequencing, motor apraxia, decreased coordination and decreased motor planning, decreased initiation, decreased attention, decreased awareness, decreased problem solving, decreased safety awareness, decreased memory and delayed processing and decreased sitting balance, decreased standing balance, decreased postural control, hemiplegia and decreased balance strategies and therefore will continue to benefit from skilled PT intervention to increase functional independence with mobility.  Patient progressing toward long term goals..  Continue plan of care. Anticipate that with regular rest periods during the day and improved medical status, pt will continue making progress towards supervision-min assist LTGs.   PT Short Term Goals Week 1:  PT Short Term Goal 1 (Week 1): Pt will transfer bed<>chair w/ mod assist PT Short Term Goal 1 - Progress (Week 1): Met PT Short Term Goal 2 (Week 1): Pt will self-propel w/c 50' w/ min  assist PT Short Term Goal 2 - Progress (Week 1): Not met PT Short Term Goal 3 (Week 1): Pt will perform sit<>stand transfer w/ min assist PT Short Term Goal 3 - Progress (Week 1): Met PT Short Term Goal 4 (Week 1): Pt will ambulate 60' w/ mod assist w/ LRAD PT Short Term Goal 4 - Progress (Week 1): Progressing toward goal Week 2:  PT Short Term Goal 1 (Week 2): =LTGs due to ELOS  Skilled Therapeutic Interventions/Progress Updates:   Session 1:  Pt in supine and agreeable to therapy, pain as detailed below, RN present providing medication. Session focused on functional mobility and tolerance to OOB activity. Total assist to don TED hose and socks, transfer to EOB, min assist. Max assist to don pants while seated EOB, however pt initiating technique w/ BLEs and RUE w/o prompting. Overall, appearing more alert this morning. Stand pivot transfer to w/c, min-mod assist w/ verbal and tactile cues for technique. Total assist w/c transport to/from therapy gym. Ambulated 47' w/ RW and w/c follow for safety, however did not need it. Min assist for gait w/ manual assist for RW management and lateral weight shifting. Returned to room and ended session in w/c, call bell in reach and all needs met.   Session 2:  Pt asleep in supine, required max stimulation to wake up. Brief noted to be soiled. Performed brief change in supine providing skilled cues for rolling technique, able to roll in bilateral directions w/ min assist and able to initiate technique w/o cues by last roll. Pt minimally awake and unable to keep eyes open despite engaging in rolling. Missed 45 min of skilled PT 2/2 fatigue. Ended session in supine, call bell in reach and all needs met.   Therapy Documentation Precautions:  Precautions  Precautions: Fall Precaution Comments: left upper arm wound, increased pain with elbow AROM and strength Restrictions Weight Bearing Restrictions: No Pain: Pain Assessment Faces Pain Scale: Hurts little  more Pain Type: Surgical pain;Acute pain Pain Location: Arm Pain Orientation: Left;Upper Pain Descriptors / Indicators: Aching;Discomfort Pain Onset: On-going Patients Stated Pain Goal: 0 Pain Intervention(s): Medication (See eMAR)(tylenol)  Therapy/Group: Individual Therapy  Casey Collins Clent Demark 10/30/2017, 9:57 AM

## 2017-10-30 NOTE — Progress Notes (Signed)
Speech Language Pathology Daily Session Note  Patient Details  Name: Casey Collins MRN: [id number] Date of Birth: [birth year]  Today's Date: 10/30/2017 SLP Individual Time: 0930-1010 SLP Individual Time Calculation (min): 40 min  Short Term Goals: Week 2: SLP Short Term Goal 1 (Week 2): Patient will consume current diet with minimal overt s/s of aspiraton with Mod A verbal cues for use of swallowing compensatory strategies.  SLP Short Term Goal 2 (Week 2): Patient will consume trials of honey-thick liquids via cup without overt s/s of aspiration with Mod A verbal cues for use of swallowing strategies over 2 sessions prior to upgrade.  SLP Short Term Goal 3 (Week 2): Patient will consume trials of Dys. 2 textures with effeicient mastication and minimal oral residue with minimal overt s/s of aspiration with Mod A verbal cues over 2 sessions prior to upgrade.  SLP Short Term Goal 4 (Week 2): Patient will utilize external aids to orient to place, time and situation with Mod A verbal cues.  SLP Short Term Goal 5 (Week 2): Patient will demonstrate sustained attention to functional tasks for 5 minutes with Mod A verbal cues.  SLP Short Term Goal 6 (Week 2): Patient will utilize an increased vocal intensity to be ~50% intelligibile at the phrase level with Mod A verbal cues.   Skilled Therapeutic Interventions: Skilled treatment session focused on cognitive and dysphagia goals. Upon arrival, patient was awake while upright in his wheelchair and appeared lethargic requiring Max A verbal cues for sustained attention to tasks throughout session. Patient sorted coins from a field of 4 with extra time due to distraction from pain in his right wrist (RN aware and patient premedicated). Patient mildly frustrated throughout session and reporting he can go home today required total A for intellectual awareness of deficits and their impact on his function. Patient consumed trials of honey-thick liquids via cup  with overt s/s of aspiration.  Due to patient's inconsistent overt s/s of aspiration and subjective decline in swallowing function based on presentation at the bedside, recommend repeat MBS tomorrow to assess swallow function. Patient left upright in wheelchair with alarm on and all needs within reach. Continue with current plan of care.        Pain Pain in wrist, patient premedicated   Therapy/Group: Individual Therapy  Cylie Dor 10/30/2017, 3:49 PM

## 2017-10-30 NOTE — Progress Notes (Signed)
On call provider was consulted for clarification of normal saline order that is still present after being told in report that the order was discontinued by provider due to blood pressures returning to above normal range. Order given to hold until PA can clarify.

## 2017-10-30 NOTE — Progress Notes (Signed)
Social Work Patient ID: Casey Collins, male   DOB: 07-06-1935, 82 y.o.   MRN: [id number]  Son in-law upset with pt's medical issues and he feels that pt has declined since coming here. Whitney-RN informed him pt doe shave a UTI and is dehydrated which he is being treated for. Pt does seem to be better today than yesterday. Son in-law wants him to have rest breaks informed him of therapies in early am then again in late afternoon to give him rest time and like he did at home. Son in-law feels this will work for him. He wants pt to do well and get home soon. He reports he will need to be mobile and back to doing what he was prior to admission before coming home. Aware of target discharge date 10/12. He wants to see how he is doing by then. Will work on discharge plans and see how pt does.

## 2017-10-30 NOTE — Progress Notes (Signed)
Speech Language Pathology Daily Session Note  Patient Details  Name: Casey Collins MRN: [id number] Date of Birth: 1935-11-02  Today's Date: 10/30/2017 SLP Individual Time: 1330-1346 SLP Individual Time Calculation (min): 16 min  Short Term Goals: Week 2: SLP Short Term Goal 1 (Week 2): Patient will consume current diet with minimal overt s/s of aspiraton with Mod A verbal cues for use of swallowing compensatory strategies.  SLP Short Term Goal 2 (Week 2): Patient will consume trials of honey-thick liquids via cup without overt s/s of aspiration with Mod A verbal cues for use of swallowing strategies over 2 sessions prior to upgrade.  SLP Short Term Goal 3 (Week 2): Patient will consume trials of Dys. 2 textures with effeicient mastication and minimal oral residue with minimal overt s/s of aspiration with Mod A verbal cues over 2 sessions prior to upgrade.  SLP Short Term Goal 4 (Week 2): Patient will utilize external aids to orient to place, time and situation with Mod A verbal cues.  SLP Short Term Goal 5 (Week 2): Patient will demonstrate sustained attention to functional tasks for 5 minutes with Mod A verbal cues.  SLP Short Term Goal 6 (Week 2): Patient will utilize an increased vocal intensity to be ~50% intelligibile at the phrase level with Mod A verbal cues.   Skilled Therapeutic Interventions: Skilled treatment session focused on dysphagia. SLP received pt in asleep in bed. SLP provided Total A verbal and physical cues for arousal. Pt able to demonstrate brief periods of arousal but not enough to safely consume lunch tray. This is further supported by fact that nursing was in room to administer meds (crushed in puree). Pt with orally hold of each bolus and required Total A multimodal cues with liquid wash to clear oral bolus. Pt with delayed wet cough when he finished consuming puree with honey thick via cup but assisted small single sips. Pt missed 14 minutes of skilled ST d/t  fatigue. Education provided to nurse on fatigue and decreased safety with PO intake d/t fatigue.      Function:     Pain    Therapy/Group: Individual Therapy  [name] 10/30/2017, 2:32 PM

## 2017-10-30 NOTE — Progress Notes (Addendum)
Occupational Therapy Session Note  Patient Details  Name: Casey Collins MRN: [id number] Date of Birth: [birth year]  Today's Date: 10/30/2017 OT Individual Time: 4098-1191 OT Individual Time Calculation (min): 43 min    Short Term Goals: Week 1:  OT Short Term Goal 1 (Week 1): Pt will complete all bathing sit to stand with no more than mod assist for 3 consecutive sessions.  OT Short Term Goal 2 (Week 1): Pt will donn pullover shirt with supervision 2/3 sessions. OT Short Term Goal 3 (Week 1): Pt will complete LB dressing with mod assist for sit to stand 2/3 sessions. OT Short Term Goal 4 (Week 1): Pt will perform stand pivot toilet transfer with mod assist using RW with hand splint on the left side.  OT Short Term Goal 5 (Week 1): Pt will increase digit flexion to at least 40% of full AROM for greater use with all selfcare tasks  Skilled Therapeutic Interventions/Progress Updates:    Pt presents sitting up in w/c, agreeable to OT tx session. Initial focus of session on UB bathing/dressing. Pt requiring modA to doff overhead shirt, when donning overhead shirt pt able to complete with setup assist initially and given increased time. Pt requires cues to sequence through washing UB as he becomes distracted during task completion, attempts to wash RUE using LUE requiring mod hand over hand assist to perform. Pt reporting need to toilet, performed stand pivot transfer w/c<>BSC over toilet using grab bars with modA. Pt attempting to perform toileting tasks though ultimately requiring totalA for clothing management and peri-care after BM. Pt left seated in w/c end of session (per pt request, given option of staying in w/c vs back to bed) with seatbelt alarm set, call bell and needs within reach.    Therapy Documentation Precautions:  Precautions Precautions: Fall Precaution Comments: left upper arm wound, increased pain with elbow AROM and strength Restrictions Weight Bearing Restrictions:  No  Pain: Pain Assessment Faces Pain Scale: Hurts little more Pain Type: Surgical pain;Acute pain Pain Location: Arm Pain Orientation: Left;Upper Pain Descriptors / Indicators: Aching;Discomfort Pain Onset: On-going Patients Stated Pain Goal: 0 Pain Intervention(s): repositioned; limited within pt tolerance        Therapy/Group: Individual Therapy  Raymondo Band 10/30/2017, 9:48 AM

## 2017-10-30 NOTE — Plan of Care (Signed)
  Problem: RH BLADDER ELIMINATION Goal: RH STG MANAGE BLADDER WITH ASSISTANCE Description STG Manage Bladder With Mod Assistance  Outcome: Progressing   Problem: RH SKIN INTEGRITY Goal: RH STG SKIN FREE OF INFECTION/BREAKDOWN Description FREE OF INFECTION AND BREAKDOWN WITH MOD ASSIST  Outcome: Progressing Goal: RH STG MAINTAIN SKIN INTEGRITY WITH ASSISTANCE Description STG Maintain Skin Integrity With Mod Assistance.  Outcome: Progressing Goal: RH STG ABLE TO PERFORM INCISION/WOUND CARE W/ASSISTANCE Description STG Able To Perform Incision/Wound Care With Mod Assistance.  Outcome: Progressing   Problem: RH SAFETY Goal: RH STG ADHERE TO SAFETY PRECAUTIONS W/ASSISTANCE/DEVICE Description STG Adhere to Safety Precautions With Mod Assistance/Device.  Outcome: Progressing Goal: RH STG DECREASED RISK OF FALL WITH ASSISTANCE Description STG Decreased Risk of Fall With Mod Assistance.  Outcome: Progressing   Problem: RH PAIN MANAGEMENT Goal: RH STG PAIN MANAGED AT OR BELOW PT'S PAIN GOAL Description LESS THAN 4   Outcome: Progressing

## 2017-10-30 NOTE — Progress Notes (Signed)
Noticed patient without a dressing on his left arm surgical site. It was replaced. Patient also pulled of his splint. Left arm & hand are edematous & the arm is painful when touched. Splint replaced & left arm placed on a pillow for edema management & comfort. He was given tylenol due to c/o pain to his penile area. Communication left for MD for evaluation & possible pyridium for urinary symptoms while his culture & sensitivity are still pending.Patient is asleep now & tylenol seems effective
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
## 2017-10-31 ENCOUNTER — Inpatient Hospital Stay (HOSPITAL_COMMUNITY): Payer: Medicare Other

## 2017-10-31 ENCOUNTER — Encounter (HOSPITAL_COMMUNITY): Payer: Self-pay | Admitting: Speech Pathology

## 2017-10-31 ENCOUNTER — Telehealth: Payer: Self-pay | Admitting: Vascular Surgery

## 2017-10-31 ENCOUNTER — Inpatient Hospital Stay (HOSPITAL_COMMUNITY): Payer: Self-pay | Admitting: Physical Therapy

## 2017-10-31 ENCOUNTER — Inpatient Hospital Stay (HOSPITAL_COMMUNITY): Payer: Self-pay | Admitting: Occupational Therapy

## 2017-10-31 ENCOUNTER — Inpatient Hospital Stay (HOSPITAL_COMMUNITY): Payer: Self-pay | Admitting: Speech Pathology

## 2017-10-31 LAB — GLUCOSE, CAPILLARY
GLUCOSE-CAPILLARY: 167 mg/dL — AB (ref 70–99)
GLUCOSE-CAPILLARY: 201 mg/dL — AB (ref 70–99)
GLUCOSE-CAPILLARY: 120 mg/dL — AB (ref 70–99)
Glucose-Capillary: 120 mg/dL — ABNORMAL HIGH (ref 70–99)

## 2017-10-31 LAB — URINE CULTURE: Culture: >=100000 — AB

## 2017-10-31 NOTE — Telephone Encounter (Signed)
resch appt spk to pt son mld ltr 12/02/17 3pm p/o MD

## 2017-10-31 NOTE — Progress Notes (Signed)
Pt transported to swallow study. Continue plan of care.  Casey Collins W Casey Collins

## 2017-10-31 NOTE — Progress Notes (Signed)
Pt returned to floor. Casey Collins W [name]

## 2017-10-31 NOTE — Progress Notes (Signed)
Modified Barium Swallow Progress Note  Patient Details  Name: Casey Collins MRN: [id number] Date of Birth: Oct 12, 1935  Today's Date: 10/31/2017  Modified Barium Swallow completed.  Full report located under Chart Review in the Imaging Section.  Brief recommendations include the following:  Clinical Impression  Patient demonstrates a moderate oropharyngeal dysphagia characterized by inconsistent premature spillage and consistent delayed swallow initiation resulting in penetration with both nectar-thick and thin liquids with deeper penetration noted with thin liquids. However, no aspiration noted throughout study. Chin tuck was not effective in eliminating penetration episodes and penetrates were not cleared despite Max cues.  Due to patient's inconsistent presentation at the bedside, consistent lethargy, minimal mobility and moderate cognitive impairments, recommend honey-thick liquids via cup and Dys. 2 textures with full supervision with treatment goals focusing on trials of upgraded liquids to assess tolerance prior to upgrade.     Swallow Evaluation Recommendations       SLP Diet Recommendations: Dysphagia 2 (Fine chop) solids;Honey thick liquids   Liquid Administration via: Cup   Medication Administration: Crushed with puree   Supervision: Full assist for feeding;Full supervision/cueing for compensatory strategies   Compensations: Slow rate;Small sips/bites;Minimize environmental distractions   Postural Changes: (OOB for meals )   Oral Care Recommendations: Oral care BID   Other Recommendations: Have oral suction available;Order thickener from pharmacy;Remove water pitcher;Prohibited food (jello, ice cream, thin soups)    [name] 10/31/2017,4:07 PM
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
## 2017-10-31 NOTE — Progress Notes (Signed)
Occupational Therapy Weekly Progress Note  Patient Details  Name: Casey Collins MRN: [id number] Date of Birth: Feb 28, 1935  Beginning of progress report period: October 23, 2017 End of progress report period: October 31, 2017  Today's Date: 10/31/2017 OT Individual Time: 1100-1202 OT Individual Time Calculation (min): 62 min    Patient has met 1 of 4 short term goals.  Casey Collins is making slow but steady progress with OT at this time.  He is able to complete stand pivot transfers with mod assist stand pivot.  Sit to stand with LB selfcare is min assist level, but after standing for sometime and completing weightshifts for bathing, he will slowly lose his balance to the right with increased knee and hip flexion, requiring mod assist.   Min assist is needed for UB bathing secondary to not being able to use the LUE for washing the right arm without mod assist.  Max assist is needed for donning pullover shirt as well.  He continues to demonstrate decreased active digit flexion in the left hand.  Full flexion is noted in the 5th digit but then it declines as you move radially toward the thumb with only approximately 30% MP flexion in the middle finger and less in the 1st and 2nd digits,  Thumb flexion is also less than 10%.  Increased pain is also noted in the right wrist and forearm with increased bruising noted.  Left elbow flexion is at a 2-/5 as well.  Feel he needs continued OT at CIR level to further progress ADL function.  Do not anticipate him meeting supervision level for these tasks, but more likely min or greater.    Patient continues to demonstrate the following deficits: muscle weakness, impaired timing and sequencing and decreased coordination, decreased problem solving, decreased memory and delayed processing and decreased sitting balance, decreased standing balance and decreased balance strategies and therefore will continue to benefit from skilled OT intervention to enhance  overall performance with BADL.  Patient not progressing toward long term goals.  See goal revision..  Continue plan of care.  OT Short Term Goals Week 2:  OT Short Term Goal 1 (Week 2): Pt will donn pullover shirt with min assist. OT Short Term Goal 2 (Week 2): Pt will donn pants with mod assist sit to stand.   OT Short Term Goal 3 (Week 2): Pt will complete toilet transfer with RW to the 3:1 with mod assist stand pivot.   OT Short Term Goal 4 (Week 2): Pt will demonstrate left hand digit flexion to 30% of normal for greater use with selfcare tasks.   Skilled Therapeutic Interventions/Progress Updates:    Pt completed bathing and dressing during session.  Min assist for transfer from supine to sit EOB with mod assist for stand pivot to the wheelchair.  Pt more alert and attentive this session compared to previous sessions.   Worked on bathing and dressing sit to stand during session.  Min assist for UB bathing with mod assist for washing the RUE with the LUE.  He needed min assist for sit to stand with mod assist for maintaining standing balance and washing peri area.  As he stood he would slowly flex his trunk and lean to the left, requiring the assist to maintain upright standing.  Max assist for donning all clothing secondary to decreased ability to use the LUE.  Finished session with call button in reach and chair alarm in place.    Therapy Documentation Precautions:  Precautions Precautions: Fall  Precaution Comments: left upper arm wound, increased pain with elbow AROM and strength Restrictions Weight Bearing Restrictions: No  Pain: Pain Assessment Pain Scale: Faces Faces Pain Scale: Hurts little more Pain Type: Acute pain Pain Location: Arm Pain Orientation: Left Pain Descriptors / Indicators: Discomfort Pain Onset: With Activity Pain Intervention(s): Repositioned;RN made aware ADL: See Care Plan Section for Details  Therapy/Group: Individual Therapy  Kitiara Hintze,[place]  OTR/L 10/31/2017, 12:17 PM

## 2017-10-31 NOTE — Plan of Care (Signed)
  Problem: RH BLADDER ELIMINATION Goal: RH STG MANAGE BLADDER WITH ASSISTANCE Description STG Manage Bladder With Mod Assistance  Outcome: Progressing   Problem: RH SKIN INTEGRITY Goal: RH STG SKIN FREE OF INFECTION/BREAKDOWN Description FREE OF INFECTION AND BREAKDOWN WITH MOD ASSIST  Outcome: Progressing Goal: RH STG MAINTAIN SKIN INTEGRITY WITH ASSISTANCE Description STG Maintain Skin Integrity With Mod Assistance.  Outcome: Progressing Goal: RH STG ABLE TO PERFORM INCISION/WOUND CARE W/ASSISTANCE Description STG Able To Perform Incision/Wound Care With Mod Assistance.  Outcome: Progressing   Problem: RH SAFETY Goal: RH STG ADHERE TO SAFETY PRECAUTIONS W/ASSISTANCE/DEVICE Description STG Adhere to Safety Precautions With Mod Assistance/Device.  Outcome: Progressing Goal: RH STG DECREASED RISK OF FALL WITH ASSISTANCE Description STG Decreased Risk of Fall With Mod Assistance.  Outcome: Progressing   Problem: RH PAIN MANAGEMENT Goal: RH STG PAIN MANAGED AT OR BELOW PT'S PAIN GOAL Description LESS THAN 4   Outcome: Progressing

## 2017-10-31 NOTE — Progress Notes (Addendum)
Physical Therapy Session Note  Patient Details  Name: Casey Collins MRN: [id number] Date of Birth: 04-25-1935  Today's Date: 10/31/2017 PT Individual Time: 1300-1415 PT Individual Time Calculation (min): 75 min   Short Term Goals: Week 2:  PT Short Term Goal 1 (Week 2): =LTGs due to ELOS  Skilled Therapeutic Interventions/Progress Updates:    Pt received seated in w/c in room, agreeable to PT. No complaints of pain. Seated BP 114/49. Sit to stand x 5 reps from w/c to RW with min A, focus on hand placement during transfer and anterior weight shift. Ambulation 2 x 30 ft with RW and min A with L hand orthosis, R lateral trunk lean with gait. Pt requires mod A to turn and sit down with RW and max cueing for sequencing and safety. Nustep level 2 x 6.5 min with RUE and BLE for global endurance, attempt to use hand grip assist for LUE, pt has onset of pain in L hand at site of injury. Stand pivot transfer w/c to/from chair x 2 reps with focus on safety and sequencing, min A and use of RW with L hand orthosis. Pt left seated in w/c in room with needs in reach sitting up for SLP session, quick release belt in place, son-in-law present. Pt requests diet Coke, education with patient and son-in-law that pt is only to have thin liquids during trials during SLP session and that SLP will determine safety of consuming thin liquids.  Therapy Documentation Precautions:  Precautions Precautions: Fall Precaution Comments: left upper arm wound, increased pain with elbow AROM and strength Restrictions Weight Bearing Restrictions: No   Therapy/Group: Individual Therapy  Excell Seltzer, PT, DPT  10/31/2017, 3:50 PM
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
## 2017-11-01 ENCOUNTER — Inpatient Hospital Stay (HOSPITAL_COMMUNITY): Payer: Self-pay | Admitting: Physical Therapy

## 2017-11-01 DIAGNOSIS — B9620 Unspecified Escherichia coli [E. coli] as the cause of diseases classified elsewhere: Secondary | ICD-10-CM

## 2017-11-01 DIAGNOSIS — R0989 Other specified symptoms and signs involving the circulatory and respiratory systems: Secondary | ICD-10-CM

## 2017-11-01 DIAGNOSIS — D62 Acute posthemorrhagic anemia: Secondary | ICD-10-CM

## 2017-11-01 DIAGNOSIS — R7309 Other abnormal glucose: Secondary | ICD-10-CM

## 2017-11-01 DIAGNOSIS — E119 Type 2 diabetes mellitus without complications: Secondary | ICD-10-CM

## 2017-11-01 DIAGNOSIS — N390 Urinary tract infection, site not specified: Secondary | ICD-10-CM

## 2017-11-01 LAB — GLUCOSE, CAPILLARY
GLUCOSE-CAPILLARY: 107 mg/dL — AB (ref 70–99)
GLUCOSE-CAPILLARY: 151 mg/dL — AB (ref 70–99)
GLUCOSE-CAPILLARY: 148 mg/dL — AB (ref 70–99)

## 2017-11-01 NOTE — Progress Notes (Signed)
Physical Therapy Session Note  Patient Details  Name: Casey Collins MRN: [id number] Date of Birth: 1935-05-13  Today's Date: 11/01/2017 PT Individual Time: 1515-1555 PT Individual Time Calculation (min): 40 min   Short Term Goals: Week 2:  PT Short Term Goal 1 (Week 2): =LTGs due to ELOS  Skilled Therapeutic Interventions/Progress Updates:   Pt in supine and agreeable to therapy, denies pain. Session focused on functional mobility and functional LE strengthening. Ambulated 100' x2 w/ RW and CGA fading to min assist w/ fatigue. Mild R foot drag w/ fatigue as well. Performed blocked practice of sit<>stands w/o UE support to work on stabilization in stance. Ambulated 20' x2 w/o UE support, min-mod assist for balance, but overall good upright posture and foot clearance bilaterally. Ended session in supine, call bell in reach and all needs met.   Therapy Documentation Precautions:  Precautions Precautions: Fall Precaution Comments: left upper arm wound, increased pain with elbow AROM and strength Restrictions Weight Bearing Restrictions: No Vital Signs: Therapy Vitals Temp: 98.1 F (36.7 C) Pulse Rate: (!) 51 Resp: 14 BP: 120/82 Patient Position (if appropriate): Lying Oxygen Therapy SpO2: 98 % O2 Device: Room Air  Therapy/Group: Individual Therapy  Modupe Shampine K Edee Nifong 11/01/2017, 4:02 PM
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
## 2017-11-02 ENCOUNTER — Inpatient Hospital Stay (HOSPITAL_COMMUNITY): Payer: Self-pay

## 2017-11-02 DIAGNOSIS — K5901 Slow transit constipation: Secondary | ICD-10-CM

## 2017-11-02 LAB — GLUCOSE, CAPILLARY
GLUCOSE-CAPILLARY: 189 mg/dL — AB (ref 70–99)
Glucose-Capillary: 109 mg/dL — ABNORMAL HIGH (ref 70–99)
Glucose-Capillary: 162 mg/dL — ABNORMAL HIGH (ref 70–99)
Glucose-Capillary: 202 mg/dL — ABNORMAL HIGH (ref 70–99)

## 2017-11-02 MED ORDER — SENNA 8.6 MG PO TABS: 2.0000 | ORAL_TABLET | Freq: Every day | ORAL | Status: DC

## 2017-11-02 MED ORDER — SENNOSIDES-DOCUSATE SODIUM 8.6-50 MG PO TABS
2.0000 | ORAL_TABLET | Freq: Every day | ORAL | Status: DC
  Administered 2017-11-02 – 2017-11-07 (×6): 2 via ORAL
  Filled 2017-11-02 (×6): qty 2
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
## 2017-11-02 NOTE — Progress Notes (Signed)
Physical Therapy Session Note  Patient Details  Name: Casey Collins MRN: [id number] Date of Birth: [birth year]  Today's Date: 11/02/2017 PT Individual Time: 1100-1210 PT Individual Time Calculation (min): 70 min   Short Term Goals: Week 2:  PT Short Term Goal 1 (Week 2): =LTGs due to ELOS  Skilled Therapeutic Interventions/Progress Updates:    Pt supine in bed upon PT arrival, agreeable to therapy tx and denies pain. Therapist donned teds and socks total assist. Pt donned shorts, performed bridging with min assist to pull shorts over hips. Pt transferred supine>sitting EOB with min assist. Pt performed stand pivot from bed>w/c with min assist. Pt transported to the dayroom. Pt transferred from w/c>nustep with min assist stand pivot. Pt used nustep x 8 minutes on workload 4 for global strengthening and activity tolerance. Pt transferred back to w/c min assist stand pivot. Pt transported to gym. Pt ambulated x 80 ft, x 50 ft and x 65 ft this session with RW and min assist, verbal cues for increased step length and upright posture. BP 124/54 after activity. Pt worked on attention and dynamic standing balance to performed horseshoe toss x 2 trials, min assist for balance. Pt performed 2 x 5 sit<>stands from w/c with min-mod assist, verbal cues for techniques and hand placement. Pt transported back to room and transferred to bed stand pivot with min assist. Pt transferred sit>supine with supervision and left supine with needs in reach and bed alarm set.   Therapy Documentation Precautions:  Precautions Precautions: Fall Precaution Comments: left upper arm wound, increased pain with elbow AROM and strength Restrictions Weight Bearing Restrictions: No    Therapy/Group: Individual Therapy  Netta Corrigan, PT, DPT 11/02/2017, 11:25 AM

## 2017-11-03 ENCOUNTER — Inpatient Hospital Stay (HOSPITAL_COMMUNITY): Payer: Self-pay

## 2017-11-03 ENCOUNTER — Inpatient Hospital Stay (HOSPITAL_COMMUNITY): Payer: Self-pay | Admitting: Occupational Therapy

## 2017-11-03 LAB — GLUCOSE, CAPILLARY
GLUCOSE-CAPILLARY: 135 mg/dL — AB (ref 70–99)
GLUCOSE-CAPILLARY: 166 mg/dL — AB (ref 70–99)
Glucose-Capillary: 141 mg/dL — ABNORMAL HIGH (ref 70–99)
Glucose-Capillary: 177 mg/dL — ABNORMAL HIGH (ref 70–99)

## 2017-11-03 LAB — CBC WITH DIFFERENTIAL/PLATELET
Abs Immature Granulocytes: 0 10*3/uL (ref 0.0–0.1)
BASOS ABS: 0 10*3/uL (ref 0.0–0.1)
Basophils Relative: 0 %
EOS PCT: 6 %
Eosinophils Absolute: 0.4 10*3/uL (ref 0.0–0.7)
HEMATOCRIT: 37 % — AB (ref 39.0–52.0)
Hemoglobin: 11.5 g/dL — ABNORMAL LOW (ref 13.0–17.0)
Immature Granulocytes: 0 %
LYMPHS PCT: 25 %
Lymphs Abs: 1.7 10*3/uL (ref 0.7–4.0)
MCH: 29.5 pg (ref 26.0–34.0)
MCHC: 31.1 g/dL (ref 30.0–36.0)
MCV: 94.9 fL (ref 78.0–100.0)
MONO ABS: 1 10*3/uL (ref 0.1–1.0)
MONOS PCT: 15 %
Neutro Abs: 3.7 10*3/uL (ref 1.7–7.7)
Neutrophils Relative %: 54 %
Platelets: 363 10*3/uL (ref 150–400)
RBC: 3.9 MIL/uL — ABNORMAL LOW (ref 4.22–5.81)
RDW: 14.1 % (ref 11.5–15.5)
WBC: 6.8 10*3/uL (ref 4.0–10.5)

## 2017-11-03 NOTE — Progress Notes (Signed)
Physical Therapy Session Note  Patient Details  Name: Casey Collins MRN: [id number] Date of Birth: Dec 13, 1935  Today's Date: 11/03/2017 PT Individual Time: 1515-1630 PT Individual Time Calculation (min): 75 min   Short Term Goals: Week 2:  PT Short Term Goal 1 (Week 2): =LTGs due to ELOS  Skilled Therapeutic Interventions/Progress Updates:    Pt supine in bed upon PT arrival, pt reports he does not want to get up. Therapist providing max encouragement and educating on benefits of therapy, pt agreeable. Pt reports L UE pain 6/10 and grimaces with L UE movement throughout session, declines pain medication from RN. Pt transferred supine>sitting EOB with mod assist, stand pivot to w/c with min assist. Pt transported to the dayroom. Pt ambulated 2 x 80 ft with RW and x 70 ft without AD, both with min assist, therapist providing verbal cues for increased step length and upright posture. Pt asking for pepsi this session and reports he does not want it thick, therapist explained his swallowing precautions and pt agreeable. Therapist thickened pepsi to honey thick and pt drank about 10 fl oz this session. Pt ambulated to the nusep, used x 6 minutes on workload 5 using LEs only for strengthening. Pt transferred to w/c with min assist. Pt worked on dynamic balance this session without UE support to toss horseshoes, x 2 trials with min assist. Pt worked on gait training this session with [place], 2 x 70 ft with min assist and verbal cues for cane placement. Pt transported back to room and left seated in w/c with family present.   Therapy Documentation Precautions:  Precautions Precautions: Fall Precaution Comments: left upper arm wound, increased pain with elbow AROM and strength Restrictions Weight Bearing Restrictions: No    Therapy/Group: Individual Therapy  Netta Corrigan, PT, DPT 11/03/2017, 8:01 AM
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
## 2017-11-03 NOTE — Progress Notes (Signed)
Occupational Therapy Session Note  Patient Details  Name: Casey Collins MRN: [id number] Date of Birth: 25-Apr-1935  Today's Date: 11/03/2017 OT Individual Time: 0626-9485 OT Individual Time Calculation (min): 72 min   Skilled Therapeutic Interventions/Progress Updates:    Pt greeted in bed with no c/o pain. Agreeable to shower. Tx focus on balance, functional transfers, cognition, and endurance during bathing and dressing at shower level. Stand pivot<w/c completed with steady assist using RW. Max A for placing L UE into orthosis. Stand pivot<TTB completed with steady assist as well. IV covered. Pt required step by step cues for sequencing of bathing tasks, as he would perseverate on washing head. Sit<stand with Mod A for perihygiene. Pt unable to maintain Lt grasp on bar and unable to stand safely without unilateral UE support on bar. Therefore perihygiene completed by OT. Unable to facilitate Lt as gross stabilizer/assist during bathing due to pain when fingers were flexed. Afterwards he dressed w/c level at sink, sit<stand. He required vcs for clothing orientation, and significantly increased time to motor plan. Very difficult for him to meet task demands with one hand, as the Lt could not maintain a functional grip to assist him. Mod A for overhead shirt. He was able to thread bilateral LEs into pants with encouragement and several rest breaks. Teds and gripper socks donned by OT because he was so exhausted at this point. Sit<stand with steady assist for OT to elevate pants over hips. At end of session pt completed stand pivot<bed and was repositioned for comfort. Pt left with all needs and bed alarm set at session exit.    Therapy Documentation Precautions:  Precautions Precautions: Fall Precaution Comments: left upper arm wound, increased pain with elbow AROM and strength Restrictions Weight Bearing Restrictions: No Vital Signs: Therapy Vitals Pulse Rate: 63 Resp: 16 BP: (!)  117/53 Patient Position (if appropriate): Lying Oxygen Therapy SpO2: 98 % O2 Device: Room Air Pain: When Lt arm was moved in certain positions and when digits were flexed, subsided with rest    ADL: ADL Grooming: Minimal assistance, Minimal cueing Where Assessed-Grooming: Sitting at sink Upper Body Bathing: Minimal assistance Lower Body Bathing: Maximal assistance, Minimal cueing Where Assessed-Lower Body Bathing: Standing at sink, Sitting at sink Upper Body Dressing: Moderate assistance, Minimal cueing Where Assessed-Upper Body Dressing: Sitting at sink Lower Body Dressing: Maximal assistance Where Assessed-Lower Body Dressing: Sitting at sink Tub/Shower Transfer: Not assessed      Therapy/Group: Individual Therapy  Jayzen Paver A Caspian Deleonardis 11/03/2017, 4:05 PM

## 2017-11-03 NOTE — Progress Notes (Signed)
Speech Language Pathology Daily Session Note  Patient Details  Name: Casey Collins MRN: [id number] Date of Birth: 1935/09/11  Today's Date: 11/03/2017 SLP Individual Time: 0810-0900 SLP Individual Time Calculation (min): 50 min  Short Term Goals: Week 2: SLP Short Term Goal 1 (Week 2): Patient will consume current diet with minimal overt s/s of aspiraton with Mod A verbal cues for use of swallowing compensatory strategies.  SLP Short Term Goal 2 (Week 2): Patient will consume  honey-thick liquids via cup without overt s/s of aspiration with Mod A verbal cues for use of swallowing strategies. SLP Short Term Goal 3 (Week 2): Patient will consume Dys. 2 textures with effeicient mastication and minimal oral residue with minimal overt s/s of aspiration with Mod A verbal cues.  SLP Short Term Goal 4 (Week 2): Patient will utilize external aids to orient to place, time and situation with Mod A verbal cues.  SLP Short Term Goal 5 (Week 2): Patient will demonstrate sustained attention to functional tasks for 5 minutes with Mod A verbal cues.  SLP Short Term Goal 6 (Week 2): Patient will utilize an increased vocal intensity to be ~50% intelligibile at the phrase level with Mod A verbal cues.   Skilled Therapeutic Interventions:Skilled ST services focused on swallow and cognitive skills. SLP facilitated PO consumption of dys 2 and HTL breakfast tray, pt demonstrated self-feeding, minimal oral residue, cleared with liquid wash given supervision A question cues and no overt s/s aspiration. SLP facilitated orientation to place and time, pt required initial max A semantic cues for time fading to mod A verbal cues. SLP attempted to facilitate recall of time with visual aids, however pictures and written cues, likely due to reading impairment where not effective for month, day and date, however year was, visual aid posted in room. Pt demonstrated sustained attention in 5 minute intervals with Mod A verbal  cues. SLP facilitated speech intelligibility strategies at phrase level given picture description task, pt demonstrated 50-60% intelligibility at phrase level with mod A verbal cues and 80% at word level. SLP facilitated oral care piror to PO trials of thin via cup , pt consumed  4 oz and demonstrated 1 cough and 2 oz via straw 1 cough noted with mod A verbal cues to consume small sips. Pt demonstrated piecemeal swallowing and  belching in 50% of trials. Pt was left in room with call bell within reach and bed alarm set. Recommend to continue skilled ST services.      Function:  Pain    Therapy/Group: Individual Therapy  Briany Aye  [facility] 11/03/2017, 8:05 AM
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
## 2017-11-04 ENCOUNTER — Inpatient Hospital Stay (HOSPITAL_COMMUNITY): Payer: Self-pay | Admitting: Occupational Therapy

## 2017-11-04 ENCOUNTER — Inpatient Hospital Stay (HOSPITAL_COMMUNITY): Payer: Self-pay | Admitting: Physical Therapy

## 2017-11-04 ENCOUNTER — Inpatient Hospital Stay (HOSPITAL_COMMUNITY): Payer: Self-pay | Admitting: Speech Pathology

## 2017-11-04 ENCOUNTER — Inpatient Hospital Stay (HOSPITAL_COMMUNITY): Payer: Self-pay

## 2017-11-04 LAB — GLUCOSE, CAPILLARY
GLUCOSE-CAPILLARY: 119 mg/dL — AB (ref 70–99)
GLUCOSE-CAPILLARY: 149 mg/dL — AB (ref 70–99)
Glucose-Capillary: 175 mg/dL — ABNORMAL HIGH (ref 70–99)
Glucose-Capillary: 164 mg/dL — ABNORMAL HIGH (ref 70–99)
Glucose-Capillary: 170 mg/dL — ABNORMAL HIGH (ref 70–99)

## 2017-11-04 NOTE — Plan of Care (Signed)
  Problem: RH SKIN INTEGRITY Goal: RH STG SKIN FREE OF INFECTION/BREAKDOWN Description FREE OF INFECTION AND BREAKDOWN WITH MOD ASSIST  Outcome: Progressing Goal: RH STG MAINTAIN SKIN INTEGRITY WITH ASSISTANCE Description STG Maintain Skin Integrity With Mod Assistance.  Outcome: Progressing   Problem: RH SAFETY Goal: RH STG ADHERE TO SAFETY PRECAUTIONS W/ASSISTANCE/DEVICE Description STG Adhere to Safety Precautions With Mod Assistance/Device.  Outcome: Progressing Goal: RH STG DECREASED RISK OF FALL WITH ASSISTANCE Description STG Decreased Risk of Fall With Mod Assistance.  Outcome: Progressing   Problem: RH PAIN MANAGEMENT Goal: RH STG PAIN MANAGED AT OR BELOW PT'S PAIN GOAL Description LESS THAN 4   Outcome: Progressing   Problem: RH BLADDER ELIMINATION Goal: RH STG MANAGE BLADDER WITH ASSISTANCE Description STG Manage Bladder With Mod Assistance  Outcome: Not Progressing   Problem: RH SKIN INTEGRITY Goal: RH STG ABLE TO PERFORM INCISION/WOUND CARE W/ASSISTANCE Description STG Able To Perform Incision/Wound Care With Mod Assistance.  Outcome: Not Progressing

## 2017-11-04 NOTE — Progress Notes (Signed)
Occupational Therapy Session Note  Patient Details  Name: Casey Collins MRN: [id number] Date of Birth: [birth year]  Today's Date: 11/04/2017 OT Individual Time: 9449-6759 OT Individual Time Calculation (min): 30 min    Short Term Goals: Week 2:  OT Short Term Goal 1 (Week 2): Pt will donn pullover shirt with min assist. OT Short Term Goal 2 (Week 2): Pt will donn pants with mod assist sit to stand.   OT Short Term Goal 3 (Week 2): Pt will complete toilet transfer with RW to the 3:1 with mod assist stand pivot.   OT Short Term Goal 4 (Week 2): Pt will demonstrate left hand digit flexion to 30% of normal for greater use with selfcare tasks.   Skilled Therapeutic Interventions/Progress Updates:    Pt presents supine in bed sleeping, able to arouse given increased time and pt agreeable to therapy session. Pt performing supine>sitting EOB with modA and initial Min assist to maintain sitting balance. Pt donning pants with overall modA for successful completion, performing sit<>stand from EOB and maintaining static standing balance with minA while advancing pants over hips. Pt requires assist to advance pants over L hips due to continued difficulty using LUE during functional tasks. Returned to sitting EOB and additional focus on gentle stretching/AAROM to LUE. When distracted pt tending to lose balance requiring min-modA for static sitting balance. Completed AAROM/stretching at elbow and digits within pt pain tolerance. Pt reports no pain with elbow flexion, slight increase in pain in L digits with increased flexion beyond 25%. Reapplied/readjusted L wrist splint for continued support. Pt returned to supine with minA, initially with reports of back pain in supine, alleviated with bed repositioning. Pt left supine in bed, bed alarm set, call bell and needs within reach.   Therapy Documentation Precautions:  Precautions Precautions: Fall Precaution Comments: left upper arm wound, increased pain  with elbow AROM and strength Restrictions Weight Bearing Restrictions: No      Therapy/Group: Individual Therapy  Raymondo Band 11/04/2017, 12:23 PM

## 2017-11-04 NOTE — Progress Notes (Signed)
Occupational Therapy Session Note  Patient Details  Name: Sylas Twombly MRN: [id number] Date of Birth: Sep 26, 1935  Today's Date: 11/04/2017 OT Individual Time: 1430-1500 OT Individual Time Calculation (min): 30 min    Short Term Goals: Week 1:  OT Short Term Goal 1 (Week 1): Pt will complete all bathing sit to stand with no more than mod assist for 3 consecutive sessions.  OT Short Term Goal 1 - Progress (Week 1): Not met OT Short Term Goal 2 (Week 1): Pt will donn pullover shirt with supervision 2/3 sessions. OT Short Term Goal 2 - Progress (Week 1): Not met OT Short Term Goal 3 (Week 1): Pt will complete LB dressing with mod assist for sit to stand 2/3 sessions. OT Short Term Goal 3 - Progress (Week 1): Not met OT Short Term Goal 4 (Week 1): Pt will perform stand pivot toilet transfer with mod assist using RW with hand splint on the left side.  OT Short Term Goal 4 - Progress (Week 1): Met OT Short Term Goal 5 (Week 1): Pt will increase digit flexion to at least 40% of full AROM for greater use with all selfcare tasks OT Short Term Goal 5 - Progress (Week 1): Not met Week 2:  OT Short Term Goal 1 (Week 2): Pt will donn pullover shirt with min assist. OT Short Term Goal 2 (Week 2): Pt will donn pants with mod assist sit to stand.   OT Short Term Goal 3 (Week 2): Pt will complete toilet transfer with RW to the 3:1 with mod assist stand pivot.   OT Short Term Goal 4 (Week 2): Pt will demonstrate left hand digit flexion to 30% of normal for greater use with selfcare tasks.  Week 3:     Skilled Therapeutic Interventions/Progress Updates:    1:1 Focus on bed mobility with min guard to come to EOB. Pt perform sit to stand and functional ambulation with min A with SPC ~110 feet. Pt did have one large LOB requiring mod A to regain balance. Focus also on left UE functional use including supination and pronation without abnormal shoulder compensations, wrist flexion/extension and lateral  key pinch and progressed to picking up items with pinch / thumb dexterity. Left up in chair with seat alarm.   Therapy Documentation Precautions:  Precautions Precautions: Fall Precaution Comments: left upper arm wound, increased pain with elbow AROM and strength Restrictions Weight Bearing Restrictions: No Pain:   no c/o pain   Therapy/Group: Individual Therapy  Willeen Cass [facility] 11/04/2017, 3:45 PM

## 2017-11-04 NOTE — Progress Notes (Signed)
Speech Language Pathology Daily Session Note  Patient Details  Name: Casey Collins MRN: [id number] Date of Birth: 1935-07-31  Today's Date: 11/04/2017 SLP Individual Time: 1355-1430 SLP Individual Time Calculation (min): 35 min  Short Term Goals: Week 2: SLP Short Term Goal 1 (Week 2): Patient will consume current diet with minimal overt s/s of aspiraton with Mod A verbal cues for use of swallowing compensatory strategies.  SLP Short Term Goal 2 (Week 2): Patient will consume  honey-thick liquids via cup without overt s/s of aspiration with Mod A verbal cues for use of swallowing strategies. SLP Short Term Goal 3 (Week 2): Patient will consume Dys. 2 textures with effeicient mastication and minimal oral residue with minimal overt s/s of aspiration with Mod A verbal cues.  SLP Short Term Goal 4 (Week 2): Patient will utilize external aids to orient to place, time and situation with Mod A verbal cues.  SLP Short Term Goal 5 (Week 2): Patient will demonstrate sustained attention to functional tasks for 5 minutes with Mod A verbal cues.  SLP Short Term Goal 6 (Week 2): Patient will utilize an increased vocal intensity to be ~50% intelligibile at the phrase level with Mod A verbal cues.   Skilled Therapeutic Interventions: Skilled treatment session focused on dysphagia and cognitive goals. Upon arrival, patient was incontinent of urine. SLP facilitated session by providing extra time for initiation and following commands during bed mobility and dressing tasks. Patient also performed oral care via the suction toothbrush with set-up assist. Patient consumed trials of thin liquids via cup with delayed throat clear X 1 and Min A verbal cues for use of small sips. Recommend patient initiate the water protocol. Patient left upright in bed with alarm on and all needs within reach. Continue with current plan of care.        Pain No/Denies Pain   Therapy/Group: Individual Therapy  Ndidi Nesby,  Ike Maragh 11/04/2017, 3:30 PM

## 2017-11-04 NOTE — Progress Notes (Signed)
Physical Therapy Session Note  Patient Details  Name: Casey Collins MRN: [id number] Date of Birth: [birth year]  Today's Date: 11/04/2017 PT Individual Time: 0800-0850 PT Individual Time Calculation (min): 50 min   Short Term Goals: Week 2:  PT Short Term Goal 1 (Week 2): =LTGs due to ELOS  Skilled Therapeutic Interventions/Progress Updates:   Pt in received in w/c, eating breakfast. Provided skilled assist while pt finished breakfast, min verbal and visual cues to attend to task, take smaller bites, and for upright posture. Total assist w/c transport to/from therapy gym for time management. Worked on gait training and functional mobility this session w/ SPC as this is what pt used at baseline. Ambulated 77' and 100' x2 w/ min assist using SPC. Verbal and tactile cues to increase foot clearance bilaterally, upright posture, and SPC gait pattern. Pt drags both foot w/ fatigue, however that continues to decrease as he gets stronger. Refused further activity after last gait bout 2/2 fatigue. Returned to room via w/c and ended session in supine, all needs in reach. Missed 10 min of skilled PT 2/2 fatigue.   Therapy Documentation Precautions:  Precautions Precautions: Fall Precaution Comments: left upper arm wound, increased pain with elbow AROM and strength Restrictions Weight Bearing Restrictions: No Vital Signs: Therapy Vitals Temp: 98 F (36.7 C) Temp Source: Oral Pulse Rate: 60 Resp: 18 BP: (!) 128/49 Patient Position (if appropriate): Lying Oxygen Therapy SpO2: 96 % O2 Device: Room Air  Therapy/Group: Individual Therapy  Harace Mccluney K Aneliese Beaudry 11/04/2017, 8:50 AM

## 2017-11-04 NOTE — Progress Notes (Signed)
Physical Therapy Session Note  Patient Details  Name: Casey Collins MRN: [id number] Date of Birth: Apr 17, 1935  Today's Date: 11/04/2017 PT Individual Time: 1515-1555 PT Individual Time Calculation (min): 40 min   Short Term Goals: Week 2:  PT Short Term Goal 1 (Week 2): =LTGs due to ELOS  Skilled Therapeutic Interventions/Progress Updates:    Pt seated in w/cupon PT arrival, agreeable to therapy tx and denies pain. Pt reports having to go to the bathroom, pt ambulated from w/c>into bathroom x 10 ft with SPC and min assist, transferred to toilet with min assist and continent of bladder this session. Pt ambulated back to w/c with min assist and transported to the gym. Pt ambulated x 80 ft with SPC and min assist working on gait and sequencing with cane. Pt ambulated from gym>rehab apartment x 60 ft with SPC and min assist, transferred on/off bed with min assist and performed bed mobility with supervision. Pt ambulated to the ortho gym and performed car transfer with [facility] and min assist. Pt transported back to gym. Pt worked on upright posture and standing balance without UE support in order to perform card matching activity reaching overhead. Pt transported back to room and left seated in w/c with needs in reach and chair alarm set.   Therapy Documentation Precautions:  Precautions Precautions: Fall Precaution Comments: left upper arm wound, increased pain with elbow AROM and strength Restrictions Weight Bearing Restrictions: No   Therapy/Group: Individual Therapy  Netta Corrigan, PT, DPT 11/04/2017, 12:51 PM
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
## 2017-11-05 ENCOUNTER — Inpatient Hospital Stay (HOSPITAL_COMMUNITY): Payer: Self-pay | Admitting: Speech Pathology

## 2017-11-05 ENCOUNTER — Inpatient Hospital Stay (HOSPITAL_COMMUNITY): Payer: Self-pay | Admitting: Occupational Therapy

## 2017-11-05 ENCOUNTER — Inpatient Hospital Stay (HOSPITAL_COMMUNITY): Payer: Self-pay

## 2017-11-05 LAB — GLUCOSE, CAPILLARY
GLUCOSE-CAPILLARY: 244 mg/dL — AB (ref 70–99)
GLUCOSE-CAPILLARY: 162 mg/dL — AB (ref 70–99)
Glucose-Capillary: 94 mg/dL (ref 70–99)
Glucose-Capillary: 187 mg/dL — ABNORMAL HIGH (ref 70–99)
Glucose-Capillary: 156 mg/dL — ABNORMAL HIGH (ref 70–99)

## 2017-11-05 LAB — CREATININE, SERUM
CREATININE: 1.19 mg/dL (ref 0.61–1.24)
GFR, EST NON AFRICAN AMERICAN: 56 mL/min — AB (ref >60)

## 2017-11-05 NOTE — Progress Notes (Addendum)
Occupational Therapy Session Note  Patient Details  Name: Casey Collins MRN: [id number] Date of Birth: Jun 27, 1935  Today's Date: 11/05/2017 OT Individual Time: 1300-1400 OT Individual Time Calculation (min): 60 min    Short Term Goals: Week 3:     Skilled Therapeutic Interventions/Progress Updates:    Pt presents supine in bed sleeping, able to arouse and agreeable to therapy session. Pt reporting pain in R side of abdomen during session though with difficulty describing, reporting it is a pain he's "never felt before", RN notified and planning to administer pain meds end of session and heat applied end of session for additional pain relief. Pt noted to have lunch tray in room and had not yet eaten, agreeable to sitting EOB for meal. Pt peformed supine>sit with modA for trunk elevation and to scoot hips towards EOB. Pt initially eating meal seated EOB with intermittent minA for sitting blance. As time progressed pt with continued lateral/posterior lean suspect due to fatigue. Transferred EOB>w/c with minA to continue meal in supported sitting. Pt requiring supervision during meal and frequent cues for taking small bites, sips, and for swallowing prior to taking next bite. Pt only with one small episode of coughing during meal. Pt returned to bed end of session in manner described above and positioned for comfort/pain relief. Pt left supine in bed, bed alarm set, call bell and needs within reach.   Therapy Documentation Precautions:  Precautions Precautions: Fall Precaution Comments: left upper arm wound, increased pain with elbow AROM and strength Restrictions Weight Bearing Restrictions: No      Therapy/Group: Individual Therapy  Casey Collins 11/05/2017, 4:51 PM

## 2017-11-05 NOTE — Progress Notes (Signed)
Physical Therapy Session Note  Patient Details  Name: Casey Collins MRN: [id number] Date of Birth: 09-Jan-1936  Today's Date: 11/05/2017 PT Individual Time: 1430-1545 PT Individual Time Calculation (min): 75 min   Short Term Goals: Week 2:  PT Short Term Goal 1 (Week 2): =LTGs due to ELOS  Skilled Therapeutic Interventions/Progress Updates:    Pt supine in bed upon PT arrival, pt reports pain in R side 10/10. RN reports that PA-C Linna Hoff) already checked pt and reports this is musculoskeletal pain. Gave pt tylenol and heat to area. Therapist attempted to help pt to sitting with min assist. Pt sitting for x 1 minute and then lays back down holding his side and grimacing. Therapist explained benefits of therapy and getting OOB. Pt transfers again to sitting with min assist and transfers to w/c with min assist stand pivot. Pt agreeable to participate in w/c level exercises this session secondary to pain. Pt transported to the dayroom. Pt used cybex kinetron for LE strengthening 2 x 4 min on 20 cm/sec. Pt reports having to use the bathroom, transported back to room and performed stand pivot to toilet min assist, min assist for clothing management, pt continent of bladder. Pt transported back to gym. Pt willing to work on ambulation despite pain in R side, pt ambulated x 80 ft without AD and min assist. RN provided pt with tylenol this session for pain relief. Pt seated in w/c worked on cognition and attention in order to complete x 2 peg board puzzles, min cues to correct errors. Pt transported back to room and transferred to bed stand pivot min assist. Pt transferred to supine and left with needs in reach, bed alarm set and heat applied for pain relief.   Therapy Documentation Precautions:  Precautions Precautions: Fall Precaution Comments: left upper arm wound, increased pain with elbow AROM and strength Restrictions Weight Bearing Restrictions: No    Therapy/Group: Individual  Therapy  Netta Corrigan, PT, DPT 11/05/2017, 8:00 AM

## 2017-11-05 NOTE — Plan of Care (Signed)
  Problem: RH SKIN INTEGRITY Goal: RH STG SKIN FREE OF INFECTION/BREAKDOWN Description FREE OF INFECTION AND BREAKDOWN WITH MOD ASSIST  Outcome: Progressing Goal: RH STG MAINTAIN SKIN INTEGRITY WITH ASSISTANCE Description STG Maintain Skin Integrity With Mod Assistance.  Outcome: Progressing Goal: RH STG ABLE TO PERFORM INCISION/WOUND CARE W/ASSISTANCE Description STG Able To Perform Incision/Wound Care With Mod Assistance.  Outcome: Progressing   Problem: RH SAFETY Goal: RH STG ADHERE TO SAFETY PRECAUTIONS W/ASSISTANCE/DEVICE Description STG Adhere to Safety Precautions With Mod Assistance/Device.  Outcome: Progressing Goal: RH STG DECREASED RISK OF FALL WITH ASSISTANCE Description STG Decreased Risk of Fall With Mod Assistance.  Outcome: Progressing   Problem: RH BLADDER ELIMINATION Goal: RH STG MANAGE BLADDER WITH ASSISTANCE Description STG Manage Bladder With Mod Assistance  Outcome: Not Progressing   Problem: RH PAIN MANAGEMENT Goal: RH STG PAIN MANAGED AT OR BELOW PT'S PAIN GOAL Description LESS THAN 4   Outcome: Not Progressing
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
## 2017-11-05 NOTE — Progress Notes (Signed)
Speech Language Pathology Daily Session Note  Patient Details  Name: Casey Collins MRN: [id number] Date of Birth: [birth year]  Today's Date: 11/05/2017 SLP Individual Time: 0825-0920 SLP Individual Time Calculation (min): 55 min  Short Term Goals: Week 2: SLP Short Term Goal 1 (Week 2): Patient will consume current diet with minimal overt s/s of aspiraton with Mod A verbal cues for use of swallowing compensatory strategies.  SLP Short Term Goal 2 (Week 2): Patient will consume  honey-thick liquids via cup without overt s/s of aspiration with Mod A verbal cues for use of swallowing strategies. SLP Short Term Goal 3 (Week 2): Patient will consume Dys. 2 textures with effeicient mastication and minimal oral residue with minimal overt s/s of aspiration with Mod A verbal cues.  SLP Short Term Goal 4 (Week 2): Patient will utilize external aids to orient to place, time and situation with Mod A verbal cues.  SLP Short Term Goal 5 (Week 2): Patient will demonstrate sustained attention to functional tasks for 5 minutes with Mod A verbal cues.  SLP Short Term Goal 6 (Week 2): Patient will utilize an increased vocal intensity to be ~50% intelligibile at the phrase level with Mod A verbal cues.   Skilled Therapeutic Interventions: Skilled treatment session focused on dysphagia and cognitive goals. SLP facilitated session by administering trials of nectar-thick liquids via cup. Patient consumed trials without overt s/s of aspiration and required Min A verbal cues for small sips. Delayed oral transit noted with a suspected delayed swallow initiation. Patient with silent penetration of nectar-thick liquids on most recent MBS, however, suspect swallowing function is close to baseline due to long history of dysphagia and family reports of intermittent overt s/s of aspiration at home during meals. Therefore, recommend patient upgrade to nectar-thick liquids with meals and conitnue water protocol. SLP will  follow closely for tolerance. Patient with decreased arousal compared to yesterday's session, however, patient demonstrated selective attention to tasks for ~30 minutes with Min A verbal cues and was oriented to place, time and situation with Min A verbal cues. Patient left upright in bed with alarm on and all needs within reach. Continue with current plan of care.       Pain Pain Assessment Pain Scale: Faces Faces Pain Scale: Hurts whole lot Pain Type: Acute pain Pain Location: Abdomen Pain Orientation: Right;Distal Pain Descriptors / Indicators: Shooting;Sharp Pain Frequency: Intermittent Pain Onset: Sudden Pain Intervention(s): Medication (See eMAR)  Therapy/Group: Individual Therapy  Consuela Widener 11/05/2017, 3:43 PM

## 2017-11-05 NOTE — Progress Notes (Signed)
Pt complained of new onset of right side pain. PA notified. PA believe pain to be more muscular pain so Kpad was ordered. Tylenol also given throughout the day when possible. Pt describes pain as sudden sharp pain to right side.

## 2017-11-05 NOTE — Progress Notes (Signed)
Orthopedic Tech Progress Note Patient Details:  Casey Collins 1935/05/14 [id number]  Patient ID: [name], male   DOB: 1935-02-21, 82 y.o.   MRN: [id number]   Hildred Priest 11/05/2017, 2:24 PM Called in [employer] brace order; spoke with the answering service

## 2017-11-05 NOTE — Progress Notes (Signed)
Social Work Patient ID: Casey Collins, male   DOB: 12-Nov-1935, 82 y.o.   MRN: [id number]  Message left for son in-law regarding team conference pt's progress and discharge date along with CGA assist level. Will await return call and see if can arrange family education prior to discharge home.

## 2017-11-06 ENCOUNTER — Inpatient Hospital Stay (HOSPITAL_COMMUNITY): Payer: Self-pay | Admitting: Speech Pathology

## 2017-11-06 ENCOUNTER — Inpatient Hospital Stay (HOSPITAL_COMMUNITY): Payer: Self-pay | Admitting: Physical Therapy

## 2017-11-06 ENCOUNTER — Inpatient Hospital Stay (HOSPITAL_COMMUNITY): Payer: Self-pay | Admitting: Occupational Therapy

## 2017-11-06 LAB — GLUCOSE, CAPILLARY
GLUCOSE-CAPILLARY: 155 mg/dL — AB (ref 70–99)
GLUCOSE-CAPILLARY: 107 mg/dL — AB (ref 70–99)
Glucose-Capillary: 105 mg/dL — ABNORMAL HIGH (ref 70–99)
Glucose-Capillary: 134 mg/dL — ABNORMAL HIGH (ref 70–99)

## 2017-11-06 NOTE — Progress Notes (Signed)
Speech Language Pathology Daily Session Notes  Patient Details  Name: Casey Collins MRN: [id number] Date of Birth: 1935/05/26  Today's Date: 11/06/2017  Session 1: SLP Individual Time: 0730-0800 SLP Individual Time Calculation (min): 30 min   Session 2: SLP Individual Time: 1000-1030 SLP Individual Time Calculation (min): 30 min  Short Term Goals: Week 2: SLP Short Term Goal 1 (Week 2): Patient will consume current diet with minimal overt s/s of aspiraton with Mod A verbal cues for use of swallowing compensatory strategies.  SLP Short Term Goal 2 (Week 2): Patient will consume  honey-thick liquids via cup without overt s/s of aspiration with Mod A verbal cues for use of swallowing strategies. SLP Short Term Goal 3 (Week 2): Patient will consume Dys. 2 textures with effeicient mastication and minimal oral residue with minimal overt s/s of aspiration with Mod A verbal cues.  SLP Short Term Goal 4 (Week 2): Patient will utilize external aids to orient to place, time and situation with Mod A verbal cues.  SLP Short Term Goal 5 (Week 2): Patient will demonstrate sustained attention to functional tasks for 5 minutes with Mod A verbal cues.  SLP Short Term Goal 6 (Week 2): Patient will utilize an increased vocal intensity to be ~50% intelligibile at the phrase level with Mod A verbal cues.   Skilled Therapeutic Interventions:  Session 1: Skilled treatment session focused on cognitive and dysphagia goals. SLP facilitated session by providing trials of thin liquids with breakfast meal of Dys. 3 textures. Patient consumed meal without overt s/s of aspiration and required Min A verbal cues for use of small sips. Recommend continued trials prior to upgrade.  Patient demonstrated selective attention to self-feeding for ~20 minutes with Mod I and was 100% intelligible at the phrase level. Patient left upright in bed with RN present. Continue with current plan of care.   Session 2: Skilled  treatment session focused on cognitive and dysphagia goals. SLP facilitated session by providing trials of thin liquids with a snack of Dys. 2 textures. Patient consumed trials with overt cough X 1, suspect due to large bolus. Patient reported he would like to continue nectar-thick liquids because it feels more "comfortable." Patient demonstrated selective attention to task for ~30 minutes with Mod I. Patient left upright in bed with all needs within reach. Continue with current plan of care.      Pain Pain in arm, RN aware and administered medications   Therapy/Group: Individual Therapy  Loda Bialas 11/06/2017, 8:25 AM

## 2017-11-06 NOTE — Progress Notes (Signed)
Physical Therapy Session Note  Patient Details  Name: Casey Collins MRN: [id number] Date of Birth: 03-19-1935  Today's Date: 11/06/2017 PT Individual Time: 6378-5885, 1030-1055, AND [phone] PT Individual Time Calculation (min): 50 min, 25 min, AND 30 min  Short Term Goals: Week 2:  PT Short Term Goal 1 (Week 2): =LTGs due to ELOS  Skilled Therapeutic Interventions/Progress Updates:   Session 1:  Pt sitting up in bed, eating breakfast. Agreeable to therapy once finished with breakfast. 4/10 pain in L arm. Denied R side pain. Gave skilled cues to finish eating breakfast including scanning entire tray, attending to task, and body awareness cues. Session focused on increasing independence w/ functional mobility including gait and functional transfers. Min assist bed<>chair transfers today w/ verbal cues for safety and technique. Worked on ambulating w/ SPC in therapy gym. Ambulated 39' x2 reps w/ CGA to min assist. Emphasis on making safe turns w/ both transfers and turning to sit as pt slightly impulsive when turning to sit. Pt's brief fell to floor during 2nd gait bout and he began to urinate onto floor. Returned to room via w/c and transferred to toilet as pt stated he needed to finish toileting. Pt c/o R side pain while on toilet and intermittently while ambulating, 4/10 pain in R side. Made RN aware of pain. Min guard while pt performed pericare w/ increased time, total assist for brief management. ended session in supine, all needs in reach.   Session 2:  Pt in supine and agreeable to therapy, denies pain at rest. Worked on functional mobility w/ RW this session to assess for improved safety prior to d/c on Saturday. Ambulated 51' x2 w/ CGA using RW and frequent verbal and tactile cues for upright posture, RW management and safety w/ turns. Pt w/ 2 spontaneous episodes where he yelled out in R side pain. Made PA-C and RN aware, PA-C recommending ice for pain relief, he believes it is  musculoskeletal in nature. Ended session in supine, ice applied to R side, all needs in reach.   Session 3:  Pt in supine and agreeable to therapy, denies pain, intermittent c/o of R side pain that resolves w/ rest, did not rate. Session focused on d/c planning and functional mobility. Transferred to EOB and provided set-up assist to don shorts. Increased time and heavy verbal, visual, and tactile cues for technique. Worked on w/c mobility while self-propelling w/c towards gym w/ supervision using RUE and BLEs, increased time. Discussed use of w/c at home as well as benefits of using RW over [facility]. Pt in agreement w/ intermittent w/c use for energy conservation, however does not understand why the RW would be safer, will continue discussion w/ son-in-law during family edu tomorrow. Negotiated stairs x4 steps w/ unilateral rail to practice w/ step negotiation for d/c (pt has one step w/ rail in to his bedroom at home). CGA w/ frequent verbal and visual cues. Returned to room and ended session in w/c, all needs in reach.   Therapy Documentation Precautions:  Precautions Precautions: Fall Precaution Comments: left upper arm wound, increased pain with elbow AROM and strength Restrictions Weight Bearing Restrictions: No Vital Signs: Therapy Vitals Temp: 98 F (36.7 C) Temp Source: Oral Pulse Rate: (!) 51 Resp: 18 BP: 140/74 Patient Position (if appropriate): Lying Oxygen Therapy SpO2: 98 % O2 Device: Room Air  Therapy/Group: Individual Therapy  Nisaiah Bechtol K Tirth Cothron 11/06/2017, 9:40 AM

## 2017-11-06 NOTE — Progress Notes (Signed)
Occupational Therapy Session Note  Patient Details  Name: Casey Collins MRN: [id number] Date of Birth: 1935/12/18  Today's Date: 11/06/2017 OT Individual Time: 1102-1201 OT Individual Time Calculation (min): 59 min    Short Term Goals: Week 2:  OT Short Term Goal 1 (Week 2): Pt will donn pullover shirt with min assist. OT Short Term Goal 2 (Week 2): Pt will donn pants with mod assist sit to stand.   OT Short Term Goal 3 (Week 2): Pt will complete toilet transfer with RW to the 3:1 with mod assist stand pivot.   OT Short Term Goal 4 (Week 2): Pt will demonstrate left hand digit flexion to 30% of normal for greater use with selfcare tasks.   Skilled Therapeutic Interventions/Progress Updates:    Pt completed transfer from bed to the walk-in shower with min assist using the RW for support.  He was able to complete all bathing with overall min assist.  Mod facilitation needed for using the LUE to wash the right arm secondary to weakness and nerve impairment affecting digit AROM.  He was able to ambulate back out to the wheelchair at the sink for dressing.  Mod assist for pullover shirt with mod assist for donning brief and pants.  Min assist for sit to stand when pulling items over hips.  He needed total assist for donning TEDs with max assist for gripper socks.  Left resting hand splint donned with max assist as well as half lap tray placed on the wheelchair.  Pt left with safety belt in place and call button and phone in reach with pt waiting for lunch.    Therapy Documentation Precautions:  Precautions Precautions: Fall Precaution Comments: left upper arm wound, increased pain with elbow AROM and strength Restrictions Weight Bearing Restrictions: No  Pain: Pain Assessment Pain Scale: Faces Pain Score: 0-No pain Faces Pain Scale: Hurts a little bit Pain Type: Acute pain Pain Location: Rib cage Pain Orientation: Right Pain Descriptors / Indicators: Aching;Discomfort Pain  Onset: On-going Pain Intervention(s): Repositioned;Cold applied ADL: See Care Plan Section for Details  Therapy/Group: Individual Therapy  Mickayla Trouten,[place] OTR/L 11/06/2017, 12:39 PM

## 2017-11-06 NOTE — Progress Notes (Addendum)
Social Work Patient ID: Casey Collins, male   DOB: 04/19/1935, 82 y.o.   MRN: [id number]  Spoke with son in-law who can come in tomorrow at 9:00 and stay for part of the day to see pt in therapies. Will see then and answer any questions he has regarding pt. Dan-PA will be aware also.
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
## 2017-11-07 ENCOUNTER — Inpatient Hospital Stay (HOSPITAL_COMMUNITY): Payer: Self-pay | Admitting: Physical Therapy

## 2017-11-07 ENCOUNTER — Encounter (HOSPITAL_COMMUNITY): Payer: Self-pay | Admitting: Speech Pathology

## 2017-11-07 ENCOUNTER — Inpatient Hospital Stay (HOSPITAL_COMMUNITY): Payer: Self-pay | Admitting: Speech Pathology

## 2017-11-07 ENCOUNTER — Encounter (HOSPITAL_COMMUNITY): Payer: Self-pay | Admitting: Occupational Therapy

## 2017-11-07 LAB — GLUCOSE, CAPILLARY
GLUCOSE-CAPILLARY: 167 mg/dL — AB (ref 70–99)
GLUCOSE-CAPILLARY: 83 mg/dL (ref 70–99)
Glucose-Capillary: 87 mg/dL (ref 70–99)
Glucose-Capillary: 166 mg/dL — ABNORMAL HIGH (ref 70–99)
Glucose-Capillary: 171 mg/dL — ABNORMAL HIGH (ref 70–99)

## 2017-11-07 MED ORDER — CLOPIDOGREL BISULFATE 75 MG PO TABS: 75.0000 mg | ORAL_TABLET | Freq: Every day | ORAL | 1 refills | Status: AC

## 2017-11-07 MED ORDER — ACETAMINOPHEN 325 MG PO TABS: 325.0000 mg | ORAL_TABLET | ORAL | Status: DC | PRN

## 2017-11-07 MED ORDER — VITAMIN C 1000 MG PO TABS: 1000.0000 mg | ORAL_TABLET | Freq: Every day | ORAL | 0 refills | Status: DC

## 2017-11-07 MED ORDER — RESOURCE THICKENUP CLEAR PO POWD: 5.0000 | ORAL | 1 refills | Status: DC | PRN

## 2017-11-07 MED ORDER — RAMIPRIL 5 MG PO CAPS: 5.0000 mg | ORAL_CAPSULE | Freq: Every day | ORAL | 1 refills | Status: DC

## 2017-11-07 MED ORDER — METOPROLOL TARTRATE 25 MG PO TABS: 25.0000 mg | ORAL_TABLET | Freq: Two times a day (BID) | ORAL | 1 refills | Status: DC

## 2017-11-07 MED ORDER — BASAGLAR KWIKPEN 100 UNIT/ML ~~LOC~~ SOPN: 10.0000 [IU] | PEN_INJECTOR | Freq: Two times a day (BID) | SUBCUTANEOUS | 1 refills | Status: DC

## 2017-11-07 MED ORDER — FAMOTIDINE 20 MG PO TABS: 20.0000 mg | ORAL_TABLET | Freq: Every day | ORAL | 0 refills | Status: DC

## 2017-11-07 MED ORDER — CITALOPRAM HYDROBROMIDE 10 MG PO TABS: 10.0000 mg | ORAL_TABLET | Freq: Every day | ORAL | 0 refills | Status: DC

## 2017-11-07 MED ORDER — GABAPENTIN 600 MG PO TABS: 300.0000 mg | ORAL_TABLET | Freq: Three times a day (TID) | ORAL | 1 refills | Status: DC

## 2017-11-07 MED ORDER — SIMVASTATIN 40 MG PO TABS: 40.0000 mg | ORAL_TABLET | Freq: Every day | ORAL | 0 refills | Status: DC
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
## 2017-11-07 NOTE — Progress Notes (Signed)
Social Work  Discharge Note  The overall goal for the admission was met for: DC SAT 10/12  Discharge location: Yes-HOME WITH DAUGHTER AND SON IN-LAW WHO CAN PROVIDE CGA LEVEL AT DISCHARGE  Length of Stay: Yes-17 DAYS  Discharge activity level: Yes-CGA  Home/community participation: Yes  Services provided included: MD, RD, PT, OT, SLP, RN, CM, Pharmacy, Neuropsych and SW  Financial Services: Medicare  Follow-up services arranged: Home Health: [facility]-PT,OT,SP, DME: [place] and Patient/Family request agency HH: HAD BEFORE, DME: NO PREF  Comments (or additional information):TIM WAS HERE FOR EDUCATION AND AWARE OF THE RECOMMENDATION FOR CGA WHEN UP AMBULATING. BOTH COMFORTABLE WITH CARE AND READY TO GO HOME TOMORROW.  Patient/Family verbalized understanding of follow-up arrangements: Yes  Individual responsible for coordination of the follow-up plan: TIM-SON IN[place] & LINDA-DAUGHTER  Confirmed correct DME delivered: Elease Hashimoto 11/07/2017    Elease Hashimoto

## 2017-11-07 NOTE — Progress Notes (Signed)
Speech Language Pathology Discharge Summary  Patient Details  Name: Casey Collins MRN: [id number] Date of Birth: 24-Jul-1935  Today's Date: 11/07/2017 SLP Individual Time: 1000-1030 SLP Individual Time Calculation (min): 30 min   Skilled Therapeutic Interventions:   Skilled treatment session focused on completion of patient and family education. Patient's son-in-law present and educated on patient's current swallowing function, diet recommendations, appropriate textures, swallowing compensatory strategies, medication administration, how to appropriately thicken liquids and water protocol procedures. He verbalized and demonstrated understanding and handouts were also given to reinforce information.  Patient's son-in-law also educated on strategies to utilize at home to maximize recall and overall safety, especially the importance of 24 hour supervision. Patient left upright in wheelchair with son-in-law present. Continue with current plan of care.   Patient has met 6 of 6 long term goals.  Patient to discharge at overall Supervision;Min level.   Reasons goals not met: N/A   Clinical Impression/Discharge Summary: Patient has made excellent gains and has met 6 of 6 LTGs this admission. Currently, patient is consuming Dys. 2 textures with nectar-thick liquids with minimal overt s/s of aspiration and Min A verbal cues for use of swallowing strategies. Patient is also currently on the water protocol to continue to work towards advancement to thin liquids. Patient's speech intelligibility is back to baseline and patient requires overall Min A verbal cues for recall, however, suspect this is baseline as well. Patient and family education is complete and patient will discharge home with 24 hour supervision from family. Patient would benefit from f/u SLP services to maximize his swallowing and cognitive function and overall functional independence in order to reduce caregiver burden.   Care Partner:   Caregiver Able to Provide Assistance: Yes  Type of Caregiver Assistance: Physical;Cognitive  Recommendation:  Home Health SLP;24 hour supervision/assistance  Rationale for SLP Follow Up: Maximize cognitive function and independence;Maximize swallowing safety;Reduce caregiver burden   Equipment: Thickener    Reasons for discharge: Treatment goals met;Discharged from hospital   Patient/Family Agrees with Progress Made and Goals Achieved: Yes    [place], [place] 11/07/2017, 11:01 AM
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
## 2017-11-07 NOTE — Discharge Summary (Signed)
Discharge summary job [phone]

## 2017-11-07 NOTE — Progress Notes (Addendum)
Physical Therapy Discharge Summary  Patient Details  Name: Casey Collins MRN: [id number] Date of Birth: Mar 27, 1935  Today's Date: 11/07/2017 PT Individual Time: 0900-1000 AND 1530-1605 PT Individual Time Calculation (min): 60 min AND 35 min  Session 1:  Pt in w/c and agreeable to therapy, requesting to toilet. Ambulated to/from toilet w/ SPC, min assist for upright balance and safety. Total assist for brief management. Performed functional mobility as detailed below including transfers, gait, stairs, and car transfer. All performed w/ close supervision using RW and LUE orthosis, CGA on stairs for safety. Son-in-law present for education on providing supervision-CGA assist for household mobility. Educated on benefits of w/c use for energy conservation and RW use for safety. Son-in-law understands that [facility] is unsafe at this time but may progress to it in the future. Both son-in-law and pt verbalized understanding and in agreement with supervision-CGA assist while at home as pt reorients to his home environment. Both appreciative and eager for d/c. Returned to room and ended session in w/c and in care of family, awaiting next therapy session.   Session 2:  Pt in supine and agreeable to therapy w/ encouragement, denies pain at rest. Increased fatigue this session. Performed bed mobility and donning pants w/ supervision and increased time. Worked on w/c mobility w/ BLEs and RUE throughout hallway. Increased endurance this session compared to previous sessions, able to go ~100' before needing a brief rest. Self-propelled >200' in total w/ supervision. Returned to room and ended session in supine, all needs in reach. Missed 10 min of PT 2/2 fatigue.   Patient has met 10 of 10 long term goals due to improved activity tolerance, improved balance, improved postural control, increased strength, decreased pain, functional use of  left upper extremity, improved attention, improved awareness and improved  coordination.  Patient to discharge at an ambulatory level Supervision.   Patient's care partner is independent to provide the necessary physical assistance at discharge. Son-in-law has been educated on providing supervision to [facility] level assist for household mobility w/ RW.   Reasons goals not met: n/a  Recommendation:  Patient will benefit from ongoing skilled PT services in home health setting to continue to advance safe functional mobility, address ongoing impairments in functional balance, endurance, coordination, and LE strength, and minimize fall risk.  Equipment: RW  Reasons for discharge: treatment goals met and discharge from hospital  Patient/family agrees with progress made and goals achieved: Yes  PT Discharge Precautions/Restrictions Precautions Precautions: Fall Precaution Comments: left upper arm wound, increased pain with elbow AROM and strength Restrictions Weight Bearing Restrictions: No Vital Signs Therapy Vitals Temp: 98.4 F (36.9 C) Temp Source: Oral Pulse Rate: (!) 50 Resp: 12 BP: (!) 106/46 Patient Position (if appropriate): Lying Oxygen Therapy SpO2: 98 % O2 Device: Room Air Pain Pain Assessment Pain Scale: Faces Faces Pain Scale: Hurts even more Pain Type: Acute pain Pain Location: Rib cage Pain Orientation: Right Pain Descriptors / Indicators: Aching;Sharp Pain Onset: Sudden Pain Intervention(s): Rest Vision/Perception  Perception Perception: Within Functional Limits Praxis Praxis-Other Comments: pt slower to initiate movements but demonstrates ability to do so, improvements noted   Cognition Overall Cognitive Status: History of cognitive impairments - at baseline Arousal/Alertness: Awake/alert Orientation Level: Oriented to person;Oriented to place;Oriented to situation;Disoriented to time Attention: Sustained Sustained Attention: Appears intact Selective Attention: Appears intact Memory: Impaired Memory Impairment: Decreased recall  of new information Awareness: Appears intact Problem Solving: Impaired Problem Solving Impairment: Functional complex Safety/Judgment: Impaired Comments: requires intermittent cues for safety Sensation  Sensation Light Touch: Appears Intact Coordination Gross Motor Movements are Fluid and Coordinated: No Fine Motor Movements are Fluid and Coordinated: No Coordination and Movement Description: pt continues to demonstrate difficulty with incorporating LUE into functional tasks, though has improved since initial eval. min hand over hand assist required intermittently to sucessfully integrate into bathing task  Motor  Motor Motor: Hemiplegia Motor - Discharge Observations: mild R hemi, which is pt's baseline  Mobility Bed Mobility Bed Mobility: Supine to Sit;Rolling Left;Rolling Right;Sit to Supine Rolling Right: Supervision/verbal cueing Supine to Sit: Supervision/Verbal cueing Sit to Supine: Supervision/Verbal cueing Transfers Transfers: Sit to Stand;Stand to Sit;Stand Pivot Transfers Sit to Stand: Supervision/Verbal cueing Stand to Sit: Supervision/Verbal cueing Stand Pivot Transfers: Supervision/Verbal cueing Transfer (Assistive device): None Locomotion  Gait Ambulation: Yes Gait Assistance: Supervision/Verbal cueing Gait Distance (Feet): 75 Feet Assistive device: Rolling walker Gait Gait: Yes Gait Pattern: Impaired Gait Pattern: Narrow base of support;Poor foot clearance - right;Shuffle Gait velocity: decreased Stairs / Additional Locomotion Stairs: Yes Stairs Assistance: Contact Guard/Touching assist Stair Management Technique: One rail Right;Step to pattern Number of Stairs: 4 Height of Stairs: 6 Ramp: Supervision/Verbal cueing Curb: Nurse, mental health Mobility: Yes Wheelchair Assistance: Supervision/Verbal cueing Wheelchair Propulsion: Right upper extremity;Both lower extermities Wheelchair Parts Management: Needs  assistance Distance: 150'  Trunk/Postural Assessment  Cervical Assessment Cervical Assessment: Exceptions to WFL(forward head) Thoracic Assessment Thoracic Assessment: Exceptions to WFL(kyphotic) Lumbar Assessment Lumbar Assessment: Exceptions to WFL(posterior pelvic tilt) Postural Control Postural Control: Deficits on evaluation(delayed)  Balance Balance Balance Assessed: Yes Static Sitting Balance Static Sitting - Balance Support: Feet supported;Right upper extremity supported;Left upper extremity supported Static Sitting - Level of Assistance: 5: Stand by assistance Dynamic Sitting Balance Dynamic Sitting - Balance Support: Feet supported;During functional activity Dynamic Sitting - Level of Assistance: 5: Stand by assistance Sitting balance - Comments: with fatigue and without UE support pt intermittently requires minA  Static Standing Balance Static Standing - Balance Support: No upper extremity supported;During functional activity Static Standing - Level of Assistance: 5: Stand by assistance Dynamic Standing Balance Dynamic Standing - Balance Support: During functional activity Dynamic Standing - Level of Assistance: 5: Stand by assistance Extremity Assessment  RUE Assessment RUE Assessment: Exceptions to [facility] General Strength Comments: pt with history of mild hemiparesis but is overall WFL for functional tasks  LUE Assessment LUE Assessment: Exceptions to [facility] Passive Range of Motion (PROM) Comments: decreased PROM to digits/wrist due to pain with increased flexion Active Range of Motion (AROM) Comments: AROM shoulder flexion approx 0-130*, elbow flexion WFL  General Strength Comments: generalized weakness moreso in wrist and digits  RLE Assessment RLE Assessment: Exceptions to [facility] Passive Range of Motion (PROM) Comments: [facility] General Strength Comments: Globally 4/5 LLE Assessment LLE Assessment: Within Functional Limits    Krisy Dix K Laurey Salser 11/07/2017, 4:12 PM

## 2017-11-07 NOTE — Progress Notes (Signed)
Social Work Patient ID: Casey Collins, male   DOB: 07/12/1935, 82 y.o.   MRN: [id number]  Met with pt and son in-law who was here for education to discuss concerns. Everything is going well and both are comfortable with his care needs. Son in-law reports if issues come over for tomorrow will contact this worker. Informed him I do not work on the weekends, but he voiced he has my work cell number. Plan for discharge tomorrow. Home health and equipment arranged.

## 2017-11-07 NOTE — Discharge Instructions (Signed)
Inpatient Rehab Discharge Instructions  Ayham Word Discharge date and time: No discharge date for patient encounter.   Activities/Precautions/ Functional Status: Activity: activity as tolerated Diet:  Wound Care: keep wound clean and dry Functional status:  ___ No restrictions     ___ Walk up steps independently ___ 24/7 supervision/assistance   ___ Walk up steps with assistance ___ Intermittent supervision/assistance  ___ Bathe/dress independently ___ Walk with walker     _x__ Bathe/dress with assistance ___ Walk Independently    ___ Shower independently ___ Walk with assistance    ___ Shower with assistance ___ No alcohol     ___ Return to work/school ________  Special Instructions:    COMMUNITY REFERRALS UPON DISCHARGE:    Home Health:   PT, OT, [place]   Date of last service:11/08/2017  Medical Equipment/Items [place]   [id number]   GENERAL COMMUNITY RESOURCES FOR PATIENT/FAMILY: Support Groups:[facility] SUPPORT GROUP THE SECOND Thursday @ 6:00-7:00 PM ON THE REHAB UNIT QUESTIONS CALL [phone]  My questions have been answered and I understand these instructions. I will adhere to these goals and the provided educational materials after my discharge from the hospital.  Patient/Caregiver Signature _______________________________ Date __________  Clinician Signature _______________________________________ Date __________  Please bring this form and your medication list with you to all your follow-up doctor's appointments.

## 2017-11-08 LAB — GLUCOSE, CAPILLARY: Glucose-Capillary: 87 mg/dL (ref 70–99)

## 2017-11-10 DIAGNOSIS — Z87440 Personal history of urinary (tract) infections: Secondary | ICD-10-CM | POA: Diagnosis not present

## 2017-11-10 DIAGNOSIS — S5492XD Injury of unspecified nerve at forearm level, left arm, subsequent encounter: Secondary | ICD-10-CM | POA: Diagnosis not present

## 2017-11-10 DIAGNOSIS — D62 Acute posthemorrhagic anemia: Secondary | ICD-10-CM | POA: Diagnosis not present

## 2017-11-10 DIAGNOSIS — R1310 Dysphagia, unspecified: Secondary | ICD-10-CM | POA: Diagnosis not present

## 2017-11-10 DIAGNOSIS — Z87891 Personal history of nicotine dependence: Secondary | ICD-10-CM | POA: Diagnosis not present

## 2017-11-10 DIAGNOSIS — W3400XD Accidental discharge from unspecified firearms or gun, subsequent encounter: Secondary | ICD-10-CM | POA: Diagnosis not present

## 2017-11-10 DIAGNOSIS — Z7982 Long term (current) use of aspirin: Secondary | ICD-10-CM | POA: Diagnosis not present

## 2017-11-10 DIAGNOSIS — I69318 Other symptoms and signs involving cognitive functions following cerebral infarction: Secondary | ICD-10-CM | POA: Diagnosis not present

## 2017-11-10 DIAGNOSIS — E119 Type 2 diabetes mellitus without complications: Secondary | ICD-10-CM | POA: Diagnosis not present

## 2017-11-10 DIAGNOSIS — Z794 Long term (current) use of insulin: Secondary | ICD-10-CM | POA: Diagnosis not present

## 2017-11-10 DIAGNOSIS — Z7902 Long term (current) use of antithrombotics/antiplatelets: Secondary | ICD-10-CM | POA: Diagnosis not present

## 2017-11-10 DIAGNOSIS — Z48812 Encounter for surgical aftercare following surgery on the circulatory system: Secondary | ICD-10-CM | POA: Diagnosis not present

## 2017-11-10 DIAGNOSIS — I10 Essential (primary) hypertension: Secondary | ICD-10-CM | POA: Diagnosis not present

## 2017-11-10 DIAGNOSIS — I69351 Hemiplegia and hemiparesis following cerebral infarction affecting right dominant side: Secondary | ICD-10-CM | POA: Diagnosis not present

## 2017-11-11 ENCOUNTER — Encounter: Payer: Medicare Other | Admitting: Vascular Surgery

## 2017-11-11 ENCOUNTER — Telehealth: Payer: Self-pay | Admitting: *Deleted

## 2017-11-11 DIAGNOSIS — S5492XD Injury of unspecified nerve at forearm level, left arm, subsequent encounter: Secondary | ICD-10-CM | POA: Diagnosis not present

## 2017-11-11 DIAGNOSIS — D62 Acute posthemorrhagic anemia: Secondary | ICD-10-CM | POA: Diagnosis not present

## 2017-11-11 DIAGNOSIS — E119 Type 2 diabetes mellitus without complications: Secondary | ICD-10-CM | POA: Diagnosis not present

## 2017-11-11 DIAGNOSIS — Z48812 Encounter for surgical aftercare following surgery on the circulatory system: Secondary | ICD-10-CM | POA: Diagnosis not present

## 2017-11-11 DIAGNOSIS — W3400XD Accidental discharge from unspecified firearms or gun, subsequent encounter: Secondary | ICD-10-CM | POA: Diagnosis not present

## 2017-11-11 DIAGNOSIS — I69351 Hemiplegia and hemiparesis following cerebral infarction affecting right dominant side: Secondary | ICD-10-CM | POA: Diagnosis not present

## 2017-11-11 NOTE — Telephone Encounter (Signed)
Return [name] call no one picked up. Spoke with Reesa Chew regarding Mr. Casey Collins his last Oxycodone prescription was on 10/29/2017, while in the hospital he was using Tylenol gor pain. Will await family to return call. Will send SW message regarding the home health aide.

## 2017-11-11 NOTE — Telephone Encounter (Signed)
Transitional Care Call completed, Appointment confirmed, address confirmed, New patient packet mailed  Transitional Care Questions   Questions for our staff to ask patients on Transitional care 48 hour phone call:   1. Are you/is patient experiencing any problems since coming home? Increased pain, need caregiving assistance  Are there any questions regarding any aspect of care? No  2. Are there any questions regarding medications administration/dosing? Son-in-law feels patient needs help managing his pain, was not prescribed pain meds at discharge  Are meds being taken as prescribed?  Yes Patient should review meds with caller to confirm   3. Have there been any falls?  No  4. Has Home Health been to the house and/or have they contacted you? Home health evaluation completed If not, have you tried to contact them? Can we help you contact them?   5. Are bowels and bladder emptying properly? Bladder control problems as previously expected Are there any unexpected incontinence issues? Patient has incontinence issues If applicable, is patient following bowel/bladder programs?   6. Any fevers, problems with breathing, unexpected pain? Increased pain in arm  7. Are there any skin problems or new areas of breakdown? Rash  8. Has the patient/family member arranged specialty MD follow up (ie cardiology/neurology/renal/surgical/etc)?  Yes Can we help arrange? No  9. Does the patient need any other services or support that we can help arrange? Home health aide would be appreciated  10. Are caregivers following through as expected in assisting the patient? Within reason  11. Has the patient quit smoking, drinking alcohol, or using drugs as recommended? No smoke, drinking, or illicit drugs

## 2017-11-11 NOTE — Telephone Encounter (Signed)
Spoke with patients son-in-law today for transitional care call. According to son-in-law, patient is experiencing increased pain without relief. He was discharged from the hospital without pain medication.  Son-in-law states that when the [facility] eval was performed, they requested a [place]. Dr. Letta Pate is on vacation this week. Forwarded this message to Danella Sensing for review

## 2017-11-14 DIAGNOSIS — E119 Type 2 diabetes mellitus without complications: Secondary | ICD-10-CM | POA: Diagnosis not present

## 2017-11-14 DIAGNOSIS — I69351 Hemiplegia and hemiparesis following cerebral infarction affecting right dominant side: Secondary | ICD-10-CM | POA: Diagnosis not present

## 2017-11-14 DIAGNOSIS — Z48812 Encounter for surgical aftercare following surgery on the circulatory system: Secondary | ICD-10-CM | POA: Diagnosis not present

## 2017-11-14 DIAGNOSIS — W3400XD Accidental discharge from unspecified firearms or gun, subsequent encounter: Secondary | ICD-10-CM | POA: Diagnosis not present

## 2017-11-14 DIAGNOSIS — S5492XD Injury of unspecified nerve at forearm level, left arm, subsequent encounter: Secondary | ICD-10-CM | POA: Diagnosis not present

## 2017-11-14 DIAGNOSIS — D62 Acute posthemorrhagic anemia: Secondary | ICD-10-CM | POA: Diagnosis not present

## 2017-11-14 NOTE — Telephone Encounter (Signed)
Return Mr. Casey Collins call, he reports he was told by Casey Collins at his discharge he could give Casey  Collins the Tramadol as needed. He was asked if he had Tramadol at home, he stated he told Casey Collins he had some Tramadol at home. According to the PMP Aware Web-Site last prescription of Tramadol was filled on 04/10/2017.  Mr. Casey Collins tried to over-talk this provider,and started to  demand stronger medication. This provider was  trying to explain to Mr. Casey Collins, since he told the Casey Collins that Mr. Casey Collins affected hand is swollen and warm to touch. He was instructed Casey Collins needs to be evaluated and  Asked him to take him to the emergency room for evaluation. He stated " he would rather go to HELL than go to our Emergency room". I asked if he would call his PC.  Mr. Casey Collins hung up on this provider.   This  Provider reviewed the discharge summary again, placed another call to Mr. Casey Collins and reiterated due to his above complaint  In regards to Casey Collins affected hand he needs to be evaluated at Urgent Care or ED, or he can  call Casey Collins office or call his PCP.   Mr. Casey Collins states he's about to call the News regarding how the opioid crisis is affecting people who are in pain, tried to reassure Mr. Casey Collins we don't want [name] in pain, but do to the above complaint of the affected side is why he needs to be evaluated. He stated "Okay". He asked for this provider name and this was given to Mr. Casey Collins.

## 2017-11-14 NOTE — Telephone Encounter (Signed)
I spoke with patients son in law [name]. He believes  That he was given the okay by hospital staff, presumably our PA's to take the Tramadol that was reported. He states that they were aware that the medication was on the patients list. He feels we should be able to provide follow through with this medication. He asks if Lyrica is an option. He reports that the patients hand on the affected side is swollen and warm to the touch. I did convey that I conversed with our provider and she did recommend going to the hospital.  He backed tracked implying that maybe that would be a little excessive that really the patient needs some assistance with his pain.  The tramadol he had helped and he is hoping we could supply an Rx. Caregiver was mildly agitated towards the end of the call.  He would REALLY appreciate a call back from [facility], ANP.
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
## 2017-11-17 ENCOUNTER — Other Ambulatory Visit: Payer: Self-pay

## 2017-11-17 ENCOUNTER — Emergency Department (HOSPITAL_COMMUNITY): Payer: Medicare Other

## 2017-11-17 ENCOUNTER — Emergency Department (HOSPITAL_COMMUNITY)
Admission: EM | Admit: 2017-11-17 | Discharge: 2017-11-17 | Disposition: A | Discharge status: Home / Self Care | Payer: Medicare Other | Attending: Emergency Medicine | Admitting: Emergency Medicine

## 2017-11-17 ENCOUNTER — Emergency Department (HOSPITAL_BASED_OUTPATIENT_CLINIC_OR_DEPARTMENT_OTHER): Payer: Medicare Other

## 2017-11-17 ENCOUNTER — Encounter (HOSPITAL_COMMUNITY): Payer: Self-pay

## 2017-11-17 ENCOUNTER — Telehealth: Payer: Self-pay | Admitting: Vascular Surgery

## 2017-11-17 DIAGNOSIS — R410 Disorientation, unspecified: Secondary | ICD-10-CM | POA: Diagnosis not present

## 2017-11-17 DIAGNOSIS — Z79899 Other long term (current) drug therapy: Secondary | ICD-10-CM | POA: Diagnosis not present

## 2017-11-17 DIAGNOSIS — E119 Type 2 diabetes mellitus without complications: Secondary | ICD-10-CM | POA: Insufficient documentation

## 2017-11-17 DIAGNOSIS — Z794 Long term (current) use of insulin: Secondary | ICD-10-CM | POA: Insufficient documentation

## 2017-11-17 DIAGNOSIS — R2232 Localized swelling, mass and lump, left upper limb: Secondary | ICD-10-CM | POA: Insufficient documentation

## 2017-11-17 DIAGNOSIS — R531 Weakness: Secondary | ICD-10-CM

## 2017-11-17 DIAGNOSIS — M7989 Other specified soft tissue disorders: Secondary | ICD-10-CM

## 2017-11-17 DIAGNOSIS — I959 Hypotension, unspecified: Secondary | ICD-10-CM | POA: Diagnosis not present

## 2017-11-17 DIAGNOSIS — Z7901 Long term (current) use of anticoagulants: Secondary | ICD-10-CM | POA: Insufficient documentation

## 2017-11-17 DIAGNOSIS — I10 Essential (primary) hypertension: Secondary | ICD-10-CM | POA: Diagnosis not present

## 2017-11-17 DIAGNOSIS — M6281 Muscle weakness (generalized): Secondary | ICD-10-CM | POA: Diagnosis not present

## 2017-11-17 DIAGNOSIS — R001 Bradycardia, unspecified: Secondary | ICD-10-CM | POA: Diagnosis not present

## 2017-11-17 LAB — CBC
HEMATOCRIT: 34.2 % — AB (ref 39.0–52.0)
Hemoglobin: 10.1 g/dL — ABNORMAL LOW (ref 13.0–17.0)
MCH: 28.1 pg (ref 26.0–34.0)
MCHC: 29.5 g/dL — AB (ref 30.0–36.0)
MCV: 95.3 fL (ref 80.0–100.0)
PLATELETS: 223 10*3/uL (ref 150–400)
RBC: 3.59 MIL/uL — ABNORMAL LOW (ref 4.22–5.81)
RDW: 14.6 % (ref 11.5–15.5)
WBC: 5 10*3/uL (ref 4.0–10.5)
nRBC: 0 % (ref 0.0–0.2)

## 2017-11-17 LAB — BASIC METABOLIC PANEL
Anion gap: 7 (ref 5–15)
BUN: 26 mg/dL — AB (ref 8–23)
CALCIUM: 8.9 mg/dL (ref 8.9–10.3)
CHLORIDE: 107 mmol/L (ref 98–111)
CO2: 27 mmol/L (ref 22–32)
CREATININE: 1.46 mg/dL — AB (ref 0.61–1.24)
GFR calc non Af Amer: 44 mL/min — ABNORMAL LOW (ref >60)
GFR, EST AFRICAN AMERICAN: 51 mL/min — AB (ref >60)
Glucose, Bld: 88 mg/dL (ref 70–99)
Potassium: 4.9 mmol/L (ref 3.5–5.1)
SODIUM: 141 mmol/L (ref 135–145)

## 2017-11-17 LAB — URINALYSIS, ROUTINE W REFLEX MICROSCOPIC
BILIRUBIN URINE: NEGATIVE
GLUCOSE, UA: NEGATIVE mg/dL
Hgb urine dipstick: NEGATIVE
KETONES UR: NEGATIVE mg/dL
Leukocytes, UA: NEGATIVE
Nitrite: NEGATIVE
PH: 5 (ref 5.0–8.0)
PROTEIN: NEGATIVE mg/dL
Specific Gravity, Urine: 1.012 (ref 1.005–1.030)

## 2017-11-17 MED ORDER — GABAPENTIN 300 MG PO CAPS
300.0000 mg | ORAL_CAPSULE | Freq: Once | ORAL | Status: AC
  Administered 2017-11-17: 300 mg via ORAL
  Filled 2017-11-17: qty 1

## 2017-11-17 MED ORDER — TRAMADOL HCL 50 MG PO TABS: 50.0000 mg | ORAL_TABLET | Freq: Four times a day (QID) | ORAL | 0 refills | Status: DC | PRN

## 2017-11-17 MED ORDER — TRAMADOL HCL 50 MG PO TABS
50.0000 mg | ORAL_TABLET | Freq: Once | ORAL | Status: AC
  Administered 2017-11-17: 50 mg via ORAL
  Filled 2017-11-17: qty 1

## 2017-11-17 NOTE — Care Management Note (Signed)
Case Management Note  Patient Details  Name: Casey Collins MRN: [id number] Date of Birth: January 17, 1936  Subjective/Objective:                  weakness  Action/Plan: ED CM spoke with the patient and his son-in-law, Casey Collins at the bedside. The patient's son-in-law reports he has a bad back and is unable to assist the patient with bathing. He states the patient had an hour and a half visit from the home health nurse (11/10/17). Reports he requested a HHA but the patient has not been scheduled for a HHA visit to date. Reports OT visited and he will have a PT visit tomorrow. ST has visited and determined ongoing ST is not needed. We discussed speaking with the patient's CM at [facility] related to concerns he reports about the timing of the initial PT and OT visits and the request for a HHA.  ED physician ordered RN, HHA and CSW (in addition to OT/PT he is receiving). New orders faxed to [facility] and confirmation received. Informed the patient's son-in-law the orders are being sent to [facility] for the additional services. EDCM informed the patient's son-in-law that if has has any concerns or needs, he can also call the nurse at [facility]. Reports he has a packet of information at home.The patient lives with his daughter and son-in-law. Tim states the patient has a 2 wheelchairs, a walker, bedside commode, shower chair/bench, canes and crutches. Denies any additional DME needs.   Expected Discharge Date:    11/17/17            Expected Discharge Plan:  [place]  In-House Referral:     Discharge planning Services     Post Acute Care Choice:    Choice offered to:     DME Arranged:    DME Agency:  NA  HH Arranged:  RN, PT, OT, Social Work, Nurse's Aide [place] Agency:  [place]  Status of Service:  Completed, signed off  If discussed at [employer], dates discussed:    Additional Comments:  Apolonio Schneiders, RN 11/17/2017, 6:19 PM

## 2017-11-17 NOTE — Progress Notes (Signed)
*  Preliminary Results* Left upper extremity venous duplex completed. Left upper extremity is negative for deep and superficial vein thrombosis.  11/17/2017 5:26 PM  Maudry Mayhew, MHA, RVT, RDCS, RDMS

## 2017-11-17 NOTE — ED Notes (Addendum)
ED Provider at bedside. Condom cath placed on pt.

## 2017-11-17 NOTE — Telephone Encounter (Signed)
On Fri 11/14/2017, Pts son-in-law Gay Filler called.  Says pt does not have pain meds.  His arm is red and swollen but not warm to the touch. Pt denies fever.  Muscle pain running down arm into hand.  He was advised to contact Pain [facility]s [facility] at Dr. Dianna Limbo office.  Son-in-law verbalized understanding.   Thurston Hole., LPN

## 2017-11-17 NOTE — ED Notes (Addendum)
Discharge instructions and prescription discussed with pt and son. Pt verbalized understanding. Pt stable and leaving via WC.

## 2017-11-17 NOTE — ED Notes (Signed)
Patient transported to X-ray 
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
## 2017-11-17 NOTE — ED Notes (Addendum)
[place] notified of pt's request for home meds gabapentin and aleve for left arm pain.
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
## 2017-11-18 ENCOUNTER — Encounter: Payer: Self-pay | Admitting: Physical Medicine & Rehabilitation

## 2017-11-18 DIAGNOSIS — Z48812 Encounter for surgical aftercare following surgery on the circulatory system: Secondary | ICD-10-CM | POA: Diagnosis not present

## 2017-11-18 DIAGNOSIS — E119 Type 2 diabetes mellitus without complications: Secondary | ICD-10-CM | POA: Diagnosis not present

## 2017-11-18 DIAGNOSIS — I69351 Hemiplegia and hemiparesis following cerebral infarction affecting right dominant side: Secondary | ICD-10-CM | POA: Diagnosis not present

## 2017-11-18 DIAGNOSIS — D62 Acute posthemorrhagic anemia: Secondary | ICD-10-CM | POA: Diagnosis not present

## 2017-11-18 DIAGNOSIS — W3400XD Accidental discharge from unspecified firearms or gun, subsequent encounter: Secondary | ICD-10-CM | POA: Diagnosis not present

## 2017-11-18 DIAGNOSIS — S5492XD Injury of unspecified nerve at forearm level, left arm, subsequent encounter: Secondary | ICD-10-CM | POA: Diagnosis not present

## 2017-11-19 DIAGNOSIS — D62 Acute posthemorrhagic anemia: Secondary | ICD-10-CM | POA: Diagnosis not present

## 2017-11-19 DIAGNOSIS — Z48812 Encounter for surgical aftercare following surgery on the circulatory system: Secondary | ICD-10-CM | POA: Diagnosis not present

## 2017-11-19 DIAGNOSIS — W3400XD Accidental discharge from unspecified firearms or gun, subsequent encounter: Secondary | ICD-10-CM | POA: Diagnosis not present

## 2017-11-19 DIAGNOSIS — S5492XD Injury of unspecified nerve at forearm level, left arm, subsequent encounter: Secondary | ICD-10-CM | POA: Diagnosis not present

## 2017-11-19 DIAGNOSIS — E119 Type 2 diabetes mellitus without complications: Secondary | ICD-10-CM | POA: Diagnosis not present

## 2017-11-19 DIAGNOSIS — I69351 Hemiplegia and hemiparesis following cerebral infarction affecting right dominant side: Secondary | ICD-10-CM | POA: Diagnosis not present

## 2017-11-20 DIAGNOSIS — I69351 Hemiplegia and hemiparesis following cerebral infarction affecting right dominant side: Secondary | ICD-10-CM | POA: Diagnosis not present

## 2017-11-20 DIAGNOSIS — S5492XD Injury of unspecified nerve at forearm level, left arm, subsequent encounter: Secondary | ICD-10-CM | POA: Diagnosis not present

## 2017-11-20 DIAGNOSIS — D62 Acute posthemorrhagic anemia: Secondary | ICD-10-CM | POA: Diagnosis not present

## 2017-11-20 DIAGNOSIS — Z48812 Encounter for surgical aftercare following surgery on the circulatory system: Secondary | ICD-10-CM | POA: Diagnosis not present

## 2017-11-20 DIAGNOSIS — W3400XD Accidental discharge from unspecified firearms or gun, subsequent encounter: Secondary | ICD-10-CM | POA: Diagnosis not present

## 2017-11-20 DIAGNOSIS — E119 Type 2 diabetes mellitus without complications: Secondary | ICD-10-CM | POA: Diagnosis not present

## 2017-11-21 DIAGNOSIS — I69351 Hemiplegia and hemiparesis following cerebral infarction affecting right dominant side: Secondary | ICD-10-CM | POA: Diagnosis not present

## 2017-11-21 DIAGNOSIS — W3400XD Accidental discharge from unspecified firearms or gun, subsequent encounter: Secondary | ICD-10-CM | POA: Diagnosis not present

## 2017-11-21 DIAGNOSIS — D62 Acute posthemorrhagic anemia: Secondary | ICD-10-CM | POA: Diagnosis not present

## 2017-11-21 DIAGNOSIS — S5492XD Injury of unspecified nerve at forearm level, left arm, subsequent encounter: Secondary | ICD-10-CM | POA: Diagnosis not present

## 2017-11-21 DIAGNOSIS — Z48812 Encounter for surgical aftercare following surgery on the circulatory system: Secondary | ICD-10-CM | POA: Diagnosis not present

## 2017-11-21 DIAGNOSIS — E119 Type 2 diabetes mellitus without complications: Secondary | ICD-10-CM | POA: Diagnosis not present

## 2017-11-22 LAB — CULTURE, BLOOD (ROUTINE X 2)
CULTURE: NO GROWTH
Culture: NO GROWTH
SPECIAL REQUESTS: ADEQUATE

## 2017-11-24 DIAGNOSIS — D62 Acute posthemorrhagic anemia: Secondary | ICD-10-CM | POA: Diagnosis not present

## 2017-11-24 DIAGNOSIS — W3400XD Accidental discharge from unspecified firearms or gun, subsequent encounter: Secondary | ICD-10-CM | POA: Diagnosis not present

## 2017-11-24 DIAGNOSIS — S5492XD Injury of unspecified nerve at forearm level, left arm, subsequent encounter: Secondary | ICD-10-CM | POA: Diagnosis not present

## 2017-11-24 DIAGNOSIS — Z48812 Encounter for surgical aftercare following surgery on the circulatory system: Secondary | ICD-10-CM | POA: Diagnosis not present

## 2017-11-24 DIAGNOSIS — I69351 Hemiplegia and hemiparesis following cerebral infarction affecting right dominant side: Secondary | ICD-10-CM | POA: Diagnosis not present

## 2017-11-24 DIAGNOSIS — E119 Type 2 diabetes mellitus without complications: Secondary | ICD-10-CM | POA: Diagnosis not present

## 2017-11-25 DIAGNOSIS — E119 Type 2 diabetes mellitus without complications: Secondary | ICD-10-CM | POA: Diagnosis not present

## 2017-11-25 DIAGNOSIS — Z48812 Encounter for surgical aftercare following surgery on the circulatory system: Secondary | ICD-10-CM | POA: Diagnosis not present

## 2017-11-25 DIAGNOSIS — W3400XD Accidental discharge from unspecified firearms or gun, subsequent encounter: Secondary | ICD-10-CM | POA: Diagnosis not present

## 2017-11-25 DIAGNOSIS — D62 Acute posthemorrhagic anemia: Secondary | ICD-10-CM | POA: Diagnosis not present

## 2017-11-25 DIAGNOSIS — S5492XD Injury of unspecified nerve at forearm level, left arm, subsequent encounter: Secondary | ICD-10-CM | POA: Diagnosis not present

## 2017-11-25 DIAGNOSIS — I69351 Hemiplegia and hemiparesis following cerebral infarction affecting right dominant side: Secondary | ICD-10-CM | POA: Diagnosis not present

## 2017-11-26 ENCOUNTER — Inpatient Hospital Stay (HOSPITAL_COMMUNITY): Admission: RE | Admit: 2017-11-26 | Payer: Self-pay | Source: Ambulatory Visit

## 2017-11-26 ENCOUNTER — Other Ambulatory Visit (HOSPITAL_COMMUNITY): Payer: Self-pay | Admitting: Internal Medicine

## 2017-11-26 DIAGNOSIS — R5383 Other fatigue: Secondary | ICD-10-CM | POA: Diagnosis not present

## 2017-11-26 DIAGNOSIS — E119 Type 2 diabetes mellitus without complications: Secondary | ICD-10-CM | POA: Diagnosis not present

## 2017-11-26 DIAGNOSIS — E1151 Type 2 diabetes mellitus with diabetic peripheral angiopathy without gangrene: Secondary | ICD-10-CM | POA: Diagnosis not present

## 2017-11-26 DIAGNOSIS — R0989 Other specified symptoms and signs involving the circulatory and respiratory systems: Secondary | ICD-10-CM

## 2017-11-26 DIAGNOSIS — D62 Acute posthemorrhagic anemia: Secondary | ICD-10-CM | POA: Diagnosis not present

## 2017-11-26 DIAGNOSIS — W3400XD Accidental discharge from unspecified firearms or gun, subsequent encounter: Secondary | ICD-10-CM | POA: Diagnosis not present

## 2017-11-26 DIAGNOSIS — I69351 Hemiplegia and hemiparesis following cerebral infarction affecting right dominant side: Secondary | ICD-10-CM | POA: Diagnosis not present

## 2017-11-26 DIAGNOSIS — R001 Bradycardia, unspecified: Secondary | ICD-10-CM | POA: Diagnosis not present

## 2017-11-26 DIAGNOSIS — I10 Essential (primary) hypertension: Secondary | ICD-10-CM | POA: Diagnosis not present

## 2017-11-26 DIAGNOSIS — E7849 Other hyperlipidemia: Secondary | ICD-10-CM | POA: Diagnosis not present

## 2017-11-26 DIAGNOSIS — Z125 Encounter for screening for malignant neoplasm of prostate: Secondary | ICD-10-CM | POA: Diagnosis not present

## 2017-11-26 DIAGNOSIS — S5492XD Injury of unspecified nerve at forearm level, left arm, subsequent encounter: Secondary | ICD-10-CM | POA: Diagnosis not present

## 2017-11-26 DIAGNOSIS — Z48812 Encounter for surgical aftercare following surgery on the circulatory system: Secondary | ICD-10-CM | POA: Diagnosis not present

## 2017-11-27 DIAGNOSIS — I69351 Hemiplegia and hemiparesis following cerebral infarction affecting right dominant side: Secondary | ICD-10-CM | POA: Diagnosis not present

## 2017-11-27 DIAGNOSIS — S5492XD Injury of unspecified nerve at forearm level, left arm, subsequent encounter: Secondary | ICD-10-CM | POA: Diagnosis not present

## 2017-11-27 DIAGNOSIS — W3400XD Accidental discharge from unspecified firearms or gun, subsequent encounter: Secondary | ICD-10-CM | POA: Diagnosis not present

## 2017-11-27 DIAGNOSIS — E119 Type 2 diabetes mellitus without complications: Secondary | ICD-10-CM | POA: Diagnosis not present

## 2017-11-27 DIAGNOSIS — D62 Acute posthemorrhagic anemia: Secondary | ICD-10-CM | POA: Diagnosis not present

## 2017-11-27 DIAGNOSIS — Z48812 Encounter for surgical aftercare following surgery on the circulatory system: Secondary | ICD-10-CM | POA: Diagnosis not present

## 2017-11-28 ENCOUNTER — Ambulatory Visit (HOSPITAL_COMMUNITY)
Admission: RE | Admit: 2017-11-28 | Discharge: 2017-11-28 | Disposition: A | Discharge status: Home / Self Care | Payer: Medicare Other | Source: Ambulatory Visit | Attending: Internal Medicine | Admitting: Internal Medicine

## 2017-11-28 DIAGNOSIS — R0989 Other specified symptoms and signs involving the circulatory and respiratory systems: Secondary | ICD-10-CM | POA: Diagnosis not present

## 2017-11-28 DIAGNOSIS — D62 Acute posthemorrhagic anemia: Secondary | ICD-10-CM | POA: Diagnosis not present

## 2017-11-28 DIAGNOSIS — S5492XD Injury of unspecified nerve at forearm level, left arm, subsequent encounter: Secondary | ICD-10-CM | POA: Diagnosis not present

## 2017-11-28 DIAGNOSIS — Z48812 Encounter for surgical aftercare following surgery on the circulatory system: Secondary | ICD-10-CM | POA: Diagnosis not present

## 2017-11-28 DIAGNOSIS — E119 Type 2 diabetes mellitus without complications: Secondary | ICD-10-CM | POA: Diagnosis not present

## 2017-11-28 DIAGNOSIS — W3400XD Accidental discharge from unspecified firearms or gun, subsequent encounter: Secondary | ICD-10-CM | POA: Diagnosis not present

## 2017-11-28 DIAGNOSIS — I69351 Hemiplegia and hemiparesis following cerebral infarction affecting right dominant side: Secondary | ICD-10-CM | POA: Diagnosis not present

## 2017-12-01 DIAGNOSIS — E119 Type 2 diabetes mellitus without complications: Secondary | ICD-10-CM | POA: Diagnosis not present

## 2017-12-01 DIAGNOSIS — I69351 Hemiplegia and hemiparesis following cerebral infarction affecting right dominant side: Secondary | ICD-10-CM | POA: Diagnosis not present

## 2017-12-01 DIAGNOSIS — D62 Acute posthemorrhagic anemia: Secondary | ICD-10-CM | POA: Diagnosis not present

## 2017-12-01 DIAGNOSIS — W3400XD Accidental discharge from unspecified firearms or gun, subsequent encounter: Secondary | ICD-10-CM | POA: Diagnosis not present

## 2017-12-01 DIAGNOSIS — Z48812 Encounter for surgical aftercare following surgery on the circulatory system: Secondary | ICD-10-CM | POA: Diagnosis not present

## 2017-12-01 DIAGNOSIS — S5492XD Injury of unspecified nerve at forearm level, left arm, subsequent encounter: Secondary | ICD-10-CM | POA: Diagnosis not present

## 2017-12-02 ENCOUNTER — Encounter: Payer: Self-pay | Admitting: Vascular Surgery

## 2017-12-02 ENCOUNTER — Other Ambulatory Visit: Payer: Self-pay

## 2017-12-02 ENCOUNTER — Ambulatory Visit (INDEPENDENT_AMBULATORY_CARE_PROVIDER_SITE_OTHER): Payer: Self-pay | Admitting: Vascular Surgery

## 2017-12-02 VITALS — BP 127/51 | HR 54 | Temp 97.8°F | Resp 20 | Ht 71.0 in | Wt 205.0 lb

## 2017-12-02 DIAGNOSIS — W3400XD Accidental discharge from unspecified firearms or gun, subsequent encounter: Secondary | ICD-10-CM | POA: Diagnosis not present

## 2017-12-02 DIAGNOSIS — E119 Type 2 diabetes mellitus without complications: Secondary | ICD-10-CM | POA: Diagnosis not present

## 2017-12-02 DIAGNOSIS — D62 Acute posthemorrhagic anemia: Secondary | ICD-10-CM | POA: Diagnosis not present

## 2017-12-02 DIAGNOSIS — S5492XD Injury of unspecified nerve at forearm level, left arm, subsequent encounter: Secondary | ICD-10-CM | POA: Diagnosis not present

## 2017-12-02 DIAGNOSIS — S45002S Unspecified injury of axillary artery, left side, sequela: Secondary | ICD-10-CM

## 2017-12-02 DIAGNOSIS — Z48812 Encounter for surgical aftercare following surgery on the circulatory system: Secondary | ICD-10-CM | POA: Diagnosis not present

## 2017-12-02 DIAGNOSIS — I69351 Hemiplegia and hemiparesis following cerebral infarction affecting right dominant side: Secondary | ICD-10-CM | POA: Diagnosis not present
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
## 2017-12-03 DIAGNOSIS — W3400XD Accidental discharge from unspecified firearms or gun, subsequent encounter: Secondary | ICD-10-CM | POA: Diagnosis not present

## 2017-12-03 DIAGNOSIS — I69351 Hemiplegia and hemiparesis following cerebral infarction affecting right dominant side: Secondary | ICD-10-CM | POA: Diagnosis not present

## 2017-12-03 DIAGNOSIS — S5492XD Injury of unspecified nerve at forearm level, left arm, subsequent encounter: Secondary | ICD-10-CM | POA: Diagnosis not present

## 2017-12-03 DIAGNOSIS — E119 Type 2 diabetes mellitus without complications: Secondary | ICD-10-CM | POA: Diagnosis not present

## 2017-12-03 DIAGNOSIS — Z48812 Encounter for surgical aftercare following surgery on the circulatory system: Secondary | ICD-10-CM | POA: Diagnosis not present

## 2017-12-03 DIAGNOSIS — D62 Acute posthemorrhagic anemia: Secondary | ICD-10-CM | POA: Diagnosis not present

## 2017-12-04 DIAGNOSIS — E119 Type 2 diabetes mellitus without complications: Secondary | ICD-10-CM | POA: Diagnosis not present

## 2017-12-04 DIAGNOSIS — D62 Acute posthemorrhagic anemia: Secondary | ICD-10-CM | POA: Diagnosis not present

## 2017-12-04 DIAGNOSIS — S5492XD Injury of unspecified nerve at forearm level, left arm, subsequent encounter: Secondary | ICD-10-CM | POA: Diagnosis not present

## 2017-12-04 DIAGNOSIS — W3400XD Accidental discharge from unspecified firearms or gun, subsequent encounter: Secondary | ICD-10-CM | POA: Diagnosis not present

## 2017-12-04 DIAGNOSIS — I69351 Hemiplegia and hemiparesis following cerebral infarction affecting right dominant side: Secondary | ICD-10-CM | POA: Diagnosis not present

## 2017-12-04 DIAGNOSIS — Z48812 Encounter for surgical aftercare following surgery on the circulatory system: Secondary | ICD-10-CM | POA: Diagnosis not present

## 2017-12-08 DIAGNOSIS — R5383 Other fatigue: Secondary | ICD-10-CM | POA: Diagnosis not present

## 2017-12-08 DIAGNOSIS — R5381 Other malaise: Secondary | ICD-10-CM | POA: Diagnosis not present

## 2017-12-08 DIAGNOSIS — R001 Bradycardia, unspecified: Secondary | ICD-10-CM | POA: Diagnosis not present

## 2017-12-08 DIAGNOSIS — Z8673 Personal history of transient ischemic attack (TIA), and cerebral infarction without residual deficits: Secondary | ICD-10-CM | POA: Diagnosis not present

## 2017-12-09 DIAGNOSIS — S5492XD Injury of unspecified nerve at forearm level, left arm, subsequent encounter: Secondary | ICD-10-CM | POA: Diagnosis not present

## 2017-12-09 DIAGNOSIS — I10 Essential (primary) hypertension: Secondary | ICD-10-CM | POA: Diagnosis not present

## 2017-12-09 DIAGNOSIS — W3400XD Accidental discharge from unspecified firearms or gun, subsequent encounter: Secondary | ICD-10-CM | POA: Diagnosis not present

## 2017-12-09 DIAGNOSIS — Z87891 Personal history of nicotine dependence: Secondary | ICD-10-CM

## 2017-12-09 DIAGNOSIS — Z794 Long term (current) use of insulin: Secondary | ICD-10-CM

## 2017-12-09 DIAGNOSIS — Z48812 Encounter for surgical aftercare following surgery on the circulatory system: Secondary | ICD-10-CM | POA: Diagnosis not present

## 2017-12-09 DIAGNOSIS — I69318 Other symptoms and signs involving cognitive functions following cerebral infarction: Secondary | ICD-10-CM

## 2017-12-09 DIAGNOSIS — Z7982 Long term (current) use of aspirin: Secondary | ICD-10-CM | POA: Diagnosis not present

## 2017-12-09 DIAGNOSIS — D62 Acute posthemorrhagic anemia: Secondary | ICD-10-CM | POA: Diagnosis not present

## 2017-12-09 DIAGNOSIS — R1310 Dysphagia, unspecified: Secondary | ICD-10-CM | POA: Diagnosis not present

## 2017-12-09 DIAGNOSIS — E119 Type 2 diabetes mellitus without complications: Secondary | ICD-10-CM

## 2017-12-09 DIAGNOSIS — I69351 Hemiplegia and hemiparesis following cerebral infarction affecting right dominant side: Secondary | ICD-10-CM | POA: Diagnosis not present

## 2017-12-09 DIAGNOSIS — Z87440 Personal history of urinary (tract) infections: Secondary | ICD-10-CM | POA: Diagnosis not present

## 2017-12-09 DIAGNOSIS — Z7902 Long term (current) use of antithrombotics/antiplatelets: Secondary | ICD-10-CM

## 2017-12-10 DIAGNOSIS — M25551 Pain in right hip: Secondary | ICD-10-CM | POA: Diagnosis not present

## 2017-12-11 DIAGNOSIS — D62 Acute posthemorrhagic anemia: Secondary | ICD-10-CM | POA: Diagnosis not present

## 2017-12-11 DIAGNOSIS — W3400XD Accidental discharge from unspecified firearms or gun, subsequent encounter: Secondary | ICD-10-CM | POA: Diagnosis not present

## 2017-12-11 DIAGNOSIS — E119 Type 2 diabetes mellitus without complications: Secondary | ICD-10-CM | POA: Diagnosis not present

## 2017-12-11 DIAGNOSIS — S5492XD Injury of unspecified nerve at forearm level, left arm, subsequent encounter: Secondary | ICD-10-CM | POA: Diagnosis not present

## 2017-12-11 DIAGNOSIS — Z48812 Encounter for surgical aftercare following surgery on the circulatory system: Secondary | ICD-10-CM | POA: Diagnosis not present

## 2017-12-11 DIAGNOSIS — I69351 Hemiplegia and hemiparesis following cerebral infarction affecting right dominant side: Secondary | ICD-10-CM | POA: Diagnosis not present

## 2017-12-15 DIAGNOSIS — W3400XD Accidental discharge from unspecified firearms or gun, subsequent encounter: Secondary | ICD-10-CM | POA: Diagnosis not present

## 2017-12-15 DIAGNOSIS — E119 Type 2 diabetes mellitus without complications: Secondary | ICD-10-CM | POA: Diagnosis not present

## 2017-12-15 DIAGNOSIS — D62 Acute posthemorrhagic anemia: Secondary | ICD-10-CM | POA: Diagnosis not present

## 2017-12-15 DIAGNOSIS — I69351 Hemiplegia and hemiparesis following cerebral infarction affecting right dominant side: Secondary | ICD-10-CM | POA: Diagnosis not present

## 2017-12-15 DIAGNOSIS — S5492XD Injury of unspecified nerve at forearm level, left arm, subsequent encounter: Secondary | ICD-10-CM | POA: Diagnosis not present

## 2017-12-15 DIAGNOSIS — Z48812 Encounter for surgical aftercare following surgery on the circulatory system: Secondary | ICD-10-CM | POA: Diagnosis not present

## 2017-12-16 DIAGNOSIS — E119 Type 2 diabetes mellitus without complications: Secondary | ICD-10-CM | POA: Diagnosis not present

## 2017-12-16 DIAGNOSIS — Z48812 Encounter for surgical aftercare following surgery on the circulatory system: Secondary | ICD-10-CM | POA: Diagnosis not present

## 2017-12-16 DIAGNOSIS — S5492XD Injury of unspecified nerve at forearm level, left arm, subsequent encounter: Secondary | ICD-10-CM | POA: Diagnosis not present

## 2017-12-16 DIAGNOSIS — I69351 Hemiplegia and hemiparesis following cerebral infarction affecting right dominant side: Secondary | ICD-10-CM | POA: Diagnosis not present

## 2017-12-16 DIAGNOSIS — D62 Acute posthemorrhagic anemia: Secondary | ICD-10-CM | POA: Diagnosis not present

## 2017-12-16 DIAGNOSIS — W3400XD Accidental discharge from unspecified firearms or gun, subsequent encounter: Secondary | ICD-10-CM | POA: Diagnosis not present

## 2017-12-17 DIAGNOSIS — Z48812 Encounter for surgical aftercare following surgery on the circulatory system: Secondary | ICD-10-CM | POA: Diagnosis not present

## 2017-12-17 DIAGNOSIS — W3400XD Accidental discharge from unspecified firearms or gun, subsequent encounter: Secondary | ICD-10-CM | POA: Diagnosis not present

## 2017-12-17 DIAGNOSIS — D62 Acute posthemorrhagic anemia: Secondary | ICD-10-CM | POA: Diagnosis not present

## 2017-12-17 DIAGNOSIS — S5492XD Injury of unspecified nerve at forearm level, left arm, subsequent encounter: Secondary | ICD-10-CM | POA: Diagnosis not present

## 2017-12-17 DIAGNOSIS — I69351 Hemiplegia and hemiparesis following cerebral infarction affecting right dominant side: Secondary | ICD-10-CM | POA: Diagnosis not present

## 2017-12-17 DIAGNOSIS — E119 Type 2 diabetes mellitus without complications: Secondary | ICD-10-CM | POA: Diagnosis not present

## 2017-12-18 DIAGNOSIS — W3400XD Accidental discharge from unspecified firearms or gun, subsequent encounter: Secondary | ICD-10-CM | POA: Diagnosis not present

## 2017-12-18 DIAGNOSIS — D62 Acute posthemorrhagic anemia: Secondary | ICD-10-CM | POA: Diagnosis not present

## 2017-12-18 DIAGNOSIS — E119 Type 2 diabetes mellitus without complications: Secondary | ICD-10-CM | POA: Diagnosis not present

## 2017-12-18 DIAGNOSIS — S5492XD Injury of unspecified nerve at forearm level, left arm, subsequent encounter: Secondary | ICD-10-CM | POA: Diagnosis not present

## 2017-12-18 DIAGNOSIS — I69351 Hemiplegia and hemiparesis following cerebral infarction affecting right dominant side: Secondary | ICD-10-CM | POA: Diagnosis not present

## 2017-12-18 DIAGNOSIS — Z48812 Encounter for surgical aftercare following surgery on the circulatory system: Secondary | ICD-10-CM | POA: Diagnosis not present

## 2017-12-22 DIAGNOSIS — E119 Type 2 diabetes mellitus without complications: Secondary | ICD-10-CM | POA: Diagnosis not present

## 2017-12-22 DIAGNOSIS — Z48812 Encounter for surgical aftercare following surgery on the circulatory system: Secondary | ICD-10-CM | POA: Diagnosis not present

## 2017-12-22 DIAGNOSIS — D62 Acute posthemorrhagic anemia: Secondary | ICD-10-CM | POA: Diagnosis not present

## 2017-12-22 DIAGNOSIS — I69351 Hemiplegia and hemiparesis following cerebral infarction affecting right dominant side: Secondary | ICD-10-CM | POA: Diagnosis not present

## 2017-12-22 DIAGNOSIS — S5492XD Injury of unspecified nerve at forearm level, left arm, subsequent encounter: Secondary | ICD-10-CM | POA: Diagnosis not present

## 2017-12-22 DIAGNOSIS — W3400XD Accidental discharge from unspecified firearms or gun, subsequent encounter: Secondary | ICD-10-CM | POA: Diagnosis not present

## 2017-12-23 DIAGNOSIS — I69351 Hemiplegia and hemiparesis following cerebral infarction affecting right dominant side: Secondary | ICD-10-CM | POA: Diagnosis not present

## 2017-12-23 DIAGNOSIS — Z48812 Encounter for surgical aftercare following surgery on the circulatory system: Secondary | ICD-10-CM | POA: Diagnosis not present

## 2017-12-23 DIAGNOSIS — E119 Type 2 diabetes mellitus without complications: Secondary | ICD-10-CM | POA: Diagnosis not present

## 2017-12-23 DIAGNOSIS — S5492XD Injury of unspecified nerve at forearm level, left arm, subsequent encounter: Secondary | ICD-10-CM | POA: Diagnosis not present

## 2017-12-23 DIAGNOSIS — D62 Acute posthemorrhagic anemia: Secondary | ICD-10-CM | POA: Diagnosis not present

## 2017-12-23 DIAGNOSIS — W3400XD Accidental discharge from unspecified firearms or gun, subsequent encounter: Secondary | ICD-10-CM | POA: Diagnosis not present

## 2017-12-24 DIAGNOSIS — S5492XD Injury of unspecified nerve at forearm level, left arm, subsequent encounter: Secondary | ICD-10-CM | POA: Diagnosis not present

## 2017-12-24 DIAGNOSIS — Z48812 Encounter for surgical aftercare following surgery on the circulatory system: Secondary | ICD-10-CM | POA: Diagnosis not present

## 2017-12-24 DIAGNOSIS — D62 Acute posthemorrhagic anemia: Secondary | ICD-10-CM | POA: Diagnosis not present

## 2017-12-24 DIAGNOSIS — E119 Type 2 diabetes mellitus without complications: Secondary | ICD-10-CM | POA: Diagnosis not present

## 2017-12-24 DIAGNOSIS — I69351 Hemiplegia and hemiparesis following cerebral infarction affecting right dominant side: Secondary | ICD-10-CM | POA: Diagnosis not present

## 2017-12-24 DIAGNOSIS — W3400XD Accidental discharge from unspecified firearms or gun, subsequent encounter: Secondary | ICD-10-CM | POA: Diagnosis not present

## 2017-12-26 DIAGNOSIS — I69351 Hemiplegia and hemiparesis following cerebral infarction affecting right dominant side: Secondary | ICD-10-CM | POA: Diagnosis not present

## 2017-12-26 DIAGNOSIS — S5492XD Injury of unspecified nerve at forearm level, left arm, subsequent encounter: Secondary | ICD-10-CM | POA: Diagnosis not present

## 2017-12-26 DIAGNOSIS — E119 Type 2 diabetes mellitus without complications: Secondary | ICD-10-CM | POA: Diagnosis not present

## 2017-12-26 DIAGNOSIS — W3400XD Accidental discharge from unspecified firearms or gun, subsequent encounter: Secondary | ICD-10-CM | POA: Diagnosis not present

## 2017-12-26 DIAGNOSIS — Z48812 Encounter for surgical aftercare following surgery on the circulatory system: Secondary | ICD-10-CM | POA: Diagnosis not present

## 2017-12-26 DIAGNOSIS — D62 Acute posthemorrhagic anemia: Secondary | ICD-10-CM | POA: Diagnosis not present

## 2017-12-29 DIAGNOSIS — I69351 Hemiplegia and hemiparesis following cerebral infarction affecting right dominant side: Secondary | ICD-10-CM | POA: Diagnosis not present

## 2017-12-29 DIAGNOSIS — Z48812 Encounter for surgical aftercare following surgery on the circulatory system: Secondary | ICD-10-CM | POA: Diagnosis not present

## 2017-12-29 DIAGNOSIS — D62 Acute posthemorrhagic anemia: Secondary | ICD-10-CM | POA: Diagnosis not present

## 2017-12-29 DIAGNOSIS — W3400XD Accidental discharge from unspecified firearms or gun, subsequent encounter: Secondary | ICD-10-CM | POA: Diagnosis not present

## 2017-12-29 DIAGNOSIS — E119 Type 2 diabetes mellitus without complications: Secondary | ICD-10-CM | POA: Diagnosis not present

## 2017-12-29 DIAGNOSIS — S5492XD Injury of unspecified nerve at forearm level, left arm, subsequent encounter: Secondary | ICD-10-CM | POA: Diagnosis not present

## 2017-12-30 DIAGNOSIS — R5383 Other fatigue: Secondary | ICD-10-CM | POA: Diagnosis not present

## 2017-12-30 DIAGNOSIS — R001 Bradycardia, unspecified: Secondary | ICD-10-CM | POA: Diagnosis not present

## 2017-12-30 DIAGNOSIS — I10 Essential (primary) hypertension: Secondary | ICD-10-CM | POA: Diagnosis not present

## 2017-12-30 DIAGNOSIS — Z6827 Body mass index (BMI) 27.0-27.9, adult: Secondary | ICD-10-CM | POA: Diagnosis not present

## 2017-12-31 DIAGNOSIS — D62 Acute posthemorrhagic anemia: Secondary | ICD-10-CM | POA: Diagnosis not present

## 2017-12-31 DIAGNOSIS — E119 Type 2 diabetes mellitus without complications: Secondary | ICD-10-CM | POA: Diagnosis not present

## 2017-12-31 DIAGNOSIS — S5492XD Injury of unspecified nerve at forearm level, left arm, subsequent encounter: Secondary | ICD-10-CM | POA: Diagnosis not present

## 2017-12-31 DIAGNOSIS — W3400XD Accidental discharge from unspecified firearms or gun, subsequent encounter: Secondary | ICD-10-CM | POA: Diagnosis not present

## 2017-12-31 DIAGNOSIS — Z48812 Encounter for surgical aftercare following surgery on the circulatory system: Secondary | ICD-10-CM | POA: Diagnosis not present

## 2017-12-31 DIAGNOSIS — I69351 Hemiplegia and hemiparesis following cerebral infarction affecting right dominant side: Secondary | ICD-10-CM | POA: Diagnosis not present

## 2018-01-02 DIAGNOSIS — E119 Type 2 diabetes mellitus without complications: Secondary | ICD-10-CM | POA: Diagnosis not present

## 2018-01-02 DIAGNOSIS — Z48812 Encounter for surgical aftercare following surgery on the circulatory system: Secondary | ICD-10-CM | POA: Diagnosis not present

## 2018-01-02 DIAGNOSIS — I69351 Hemiplegia and hemiparesis following cerebral infarction affecting right dominant side: Secondary | ICD-10-CM | POA: Diagnosis not present

## 2018-01-02 DIAGNOSIS — D62 Acute posthemorrhagic anemia: Secondary | ICD-10-CM | POA: Diagnosis not present

## 2018-01-02 DIAGNOSIS — W3400XD Accidental discharge from unspecified firearms or gun, subsequent encounter: Secondary | ICD-10-CM | POA: Diagnosis not present

## 2018-01-02 DIAGNOSIS — S5492XD Injury of unspecified nerve at forearm level, left arm, subsequent encounter: Secondary | ICD-10-CM | POA: Diagnosis not present

## 2018-01-05 DIAGNOSIS — D62 Acute posthemorrhagic anemia: Secondary | ICD-10-CM | POA: Diagnosis not present

## 2018-01-05 DIAGNOSIS — I69351 Hemiplegia and hemiparesis following cerebral infarction affecting right dominant side: Secondary | ICD-10-CM | POA: Diagnosis not present

## 2018-01-05 DIAGNOSIS — W3400XD Accidental discharge from unspecified firearms or gun, subsequent encounter: Secondary | ICD-10-CM | POA: Diagnosis not present

## 2018-01-05 DIAGNOSIS — E119 Type 2 diabetes mellitus without complications: Secondary | ICD-10-CM | POA: Diagnosis not present

## 2018-01-05 DIAGNOSIS — S5492XD Injury of unspecified nerve at forearm level, left arm, subsequent encounter: Secondary | ICD-10-CM | POA: Diagnosis not present

## 2018-01-05 DIAGNOSIS — Z48812 Encounter for surgical aftercare following surgery on the circulatory system: Secondary | ICD-10-CM | POA: Diagnosis not present

## 2018-01-07 DIAGNOSIS — E119 Type 2 diabetes mellitus without complications: Secondary | ICD-10-CM | POA: Diagnosis not present

## 2018-01-07 DIAGNOSIS — D62 Acute posthemorrhagic anemia: Secondary | ICD-10-CM | POA: Diagnosis not present

## 2018-01-07 DIAGNOSIS — W3400XD Accidental discharge from unspecified firearms or gun, subsequent encounter: Secondary | ICD-10-CM | POA: Diagnosis not present

## 2018-01-07 DIAGNOSIS — S5492XD Injury of unspecified nerve at forearm level, left arm, subsequent encounter: Secondary | ICD-10-CM | POA: Diagnosis not present

## 2018-01-07 DIAGNOSIS — Z48812 Encounter for surgical aftercare following surgery on the circulatory system: Secondary | ICD-10-CM | POA: Diagnosis not present

## 2018-01-07 DIAGNOSIS — I69351 Hemiplegia and hemiparesis following cerebral infarction affecting right dominant side: Secondary | ICD-10-CM | POA: Diagnosis not present

## 2018-01-08 DIAGNOSIS — S5492XD Injury of unspecified nerve at forearm level, left arm, subsequent encounter: Secondary | ICD-10-CM | POA: Diagnosis not present

## 2018-01-08 DIAGNOSIS — Z48812 Encounter for surgical aftercare following surgery on the circulatory system: Secondary | ICD-10-CM | POA: Diagnosis not present

## 2018-01-08 DIAGNOSIS — E119 Type 2 diabetes mellitus without complications: Secondary | ICD-10-CM | POA: Diagnosis not present

## 2018-01-08 DIAGNOSIS — D62 Acute posthemorrhagic anemia: Secondary | ICD-10-CM | POA: Diagnosis not present

## 2018-01-08 DIAGNOSIS — I69351 Hemiplegia and hemiparesis following cerebral infarction affecting right dominant side: Secondary | ICD-10-CM | POA: Diagnosis not present

## 2018-01-08 DIAGNOSIS — W3400XD Accidental discharge from unspecified firearms or gun, subsequent encounter: Secondary | ICD-10-CM | POA: Diagnosis not present

## 2018-01-13 DIAGNOSIS — G8324 Monoplegia of upper limb affecting left nondominant side: Secondary | ICD-10-CM | POA: Diagnosis not present

## 2018-01-16 DIAGNOSIS — G8324 Monoplegia of upper limb affecting left nondominant side: Secondary | ICD-10-CM | POA: Diagnosis not present

## 2018-01-26 DIAGNOSIS — Z01810 Encounter for preprocedural cardiovascular examination: Secondary | ICD-10-CM | POA: Diagnosis not present

## 2018-01-26 DIAGNOSIS — R5381 Other malaise: Secondary | ICD-10-CM | POA: Diagnosis not present

## 2018-01-26 DIAGNOSIS — Z8673 Personal history of transient ischemic attack (TIA), and cerebral infarction without residual deficits: Secondary | ICD-10-CM | POA: Diagnosis not present

## 2018-01-26 DIAGNOSIS — R001 Bradycardia, unspecified: Secondary | ICD-10-CM | POA: Diagnosis not present

## 2018-02-09 DIAGNOSIS — Z01810 Encounter for preprocedural cardiovascular examination: Secondary | ICD-10-CM | POA: Diagnosis not present

## 2018-02-09 DIAGNOSIS — R001 Bradycardia, unspecified: Secondary | ICD-10-CM | POA: Diagnosis not present

## 2018-02-10 DIAGNOSIS — R001 Bradycardia, unspecified: Secondary | ICD-10-CM | POA: Diagnosis not present

## 2018-02-10 DIAGNOSIS — Z01810 Encounter for preprocedural cardiovascular examination: Secondary | ICD-10-CM | POA: Diagnosis not present

## 2018-02-11 DIAGNOSIS — Z6827 Body mass index (BMI) 27.0-27.9, adult: Secondary | ICD-10-CM | POA: Diagnosis not present

## 2018-02-11 DIAGNOSIS — I10 Essential (primary) hypertension: Secondary | ICD-10-CM | POA: Diagnosis not present

## 2018-02-11 DIAGNOSIS — R221 Localized swelling, mass and lump, neck: Secondary | ICD-10-CM | POA: Diagnosis not present

## 2018-02-18 ENCOUNTER — Other Ambulatory Visit: Payer: Self-pay | Admitting: Internal Medicine

## 2018-02-18 DIAGNOSIS — R221 Localized swelling, mass and lump, neck: Secondary | ICD-10-CM

## 2018-02-23 ENCOUNTER — Encounter: Payer: Self-pay | Admitting: Radiology

## 2018-02-23 ENCOUNTER — Ambulatory Visit
Admission: RE | Admit: 2018-02-23 | Discharge: 2018-02-23 | Disposition: A | Discharge status: Home / Self Care | Payer: Medicare Other | Source: Ambulatory Visit | Attending: Internal Medicine | Admitting: Internal Medicine

## 2018-02-23 DIAGNOSIS — R221 Localized swelling, mass and lump, neck: Secondary | ICD-10-CM

## 2018-02-23 MED ORDER — IOPAMIDOL (ISOVUE-300) INJECTION 61%
75.0000 mL | Freq: Once | INTRAVENOUS | Status: AC | PRN
  Administered 2018-02-23: 50 mL via INTRAVENOUS

## 2018-02-24 DIAGNOSIS — R9439 Abnormal result of other cardiovascular function study: Secondary | ICD-10-CM | POA: Diagnosis not present

## 2018-02-24 DIAGNOSIS — R001 Bradycardia, unspecified: Secondary | ICD-10-CM | POA: Diagnosis not present

## 2018-02-24 DIAGNOSIS — I6521 Occlusion and stenosis of right carotid artery: Secondary | ICD-10-CM | POA: Diagnosis not present

## 2018-02-24 DIAGNOSIS — Z01810 Encounter for preprocedural cardiovascular examination: Secondary | ICD-10-CM | POA: Diagnosis not present

## 2018-03-03 ENCOUNTER — Ambulatory Visit: Payer: Self-pay | Admitting: Vascular Surgery

## 2018-03-03 ENCOUNTER — Encounter: Payer: Self-pay | Admitting: Family

## 2018-03-16 ENCOUNTER — Telehealth: Payer: Self-pay | Admitting: Radiology

## 2018-03-17 ENCOUNTER — Encounter (HOSPITAL_COMMUNITY): Admission: RE | Payer: Self-pay | Source: Home / Self Care

## 2018-03-17 ENCOUNTER — Ambulatory Visit (HOSPITAL_COMMUNITY): Admission: RE | Admit: 2018-03-17 | Payer: Medicare Other | Source: Home / Self Care | Admitting: Cardiology

## 2018-03-17 SURGERY — LEFT HEART CATH AND CORONARY ANGIOGRAPHY
Anesthesia: LOCAL

## 2018-03-25 ENCOUNTER — Telehealth: Payer: Self-pay

## 2018-03-25 ENCOUNTER — Ambulatory Visit: Payer: Self-pay | Admitting: Cardiology

## 2018-04-14 ENCOUNTER — Other Ambulatory Visit: Payer: Self-pay | Admitting: Cardiology

## 2018-07-13 ENCOUNTER — Other Ambulatory Visit: Payer: Self-pay | Admitting: Cardiology

## 2018-08-10 DIAGNOSIS — R32 Unspecified urinary incontinence: Secondary | ICD-10-CM | POA: Diagnosis not present

## 2018-10-12 ENCOUNTER — Other Ambulatory Visit: Payer: Self-pay | Admitting: Cardiology

## 2018-10-22 DIAGNOSIS — M533 Sacrococcygeal disorders, not elsewhere classified: Secondary | ICD-10-CM | POA: Diagnosis not present

## 2018-10-26 ENCOUNTER — Emergency Department (HOSPITAL_COMMUNITY): Payer: Medicare Other

## 2018-10-26 ENCOUNTER — Emergency Department (HOSPITAL_COMMUNITY)
Admission: EM | Admit: 2018-10-26 | Discharge: 2018-10-26 | Disposition: A | Discharge status: Home / Self Care | Payer: Medicare Other | Attending: Emergency Medicine | Admitting: Emergency Medicine

## 2018-10-26 ENCOUNTER — Other Ambulatory Visit: Payer: Self-pay

## 2018-10-26 ENCOUNTER — Encounter (HOSPITAL_COMMUNITY): Payer: Self-pay | Admitting: Emergency Medicine

## 2018-10-26 DIAGNOSIS — Z7902 Long term (current) use of antithrombotics/antiplatelets: Secondary | ICD-10-CM | POA: Diagnosis not present

## 2018-10-26 DIAGNOSIS — Y92009 Unspecified place in unspecified non-institutional (private) residence as the place of occurrence of the external cause: Secondary | ICD-10-CM

## 2018-10-26 DIAGNOSIS — M167 Other unilateral secondary osteoarthritis of hip: Secondary | ICD-10-CM | POA: Diagnosis not present

## 2018-10-26 DIAGNOSIS — Y93H2 Activity, gardening and landscaping: Secondary | ICD-10-CM | POA: Diagnosis not present

## 2018-10-26 DIAGNOSIS — S79911A Unspecified injury of right hip, initial encounter: Secondary | ICD-10-CM

## 2018-10-26 DIAGNOSIS — Z79899 Other long term (current) drug therapy: Secondary | ICD-10-CM | POA: Insufficient documentation

## 2018-10-26 DIAGNOSIS — Y999 Unspecified external cause status: Secondary | ICD-10-CM | POA: Insufficient documentation

## 2018-10-26 DIAGNOSIS — S7010XA Contusion of unspecified thigh, initial encounter: Secondary | ICD-10-CM

## 2018-10-26 DIAGNOSIS — M25551 Pain in right hip: Secondary | ICD-10-CM | POA: Diagnosis not present

## 2018-10-26 DIAGNOSIS — W010XXA Fall on same level from slipping, tripping and stumbling without subsequent striking against object, initial encounter: Secondary | ICD-10-CM | POA: Insufficient documentation

## 2018-10-26 DIAGNOSIS — S3992XA Unspecified injury of lower back, initial encounter: Secondary | ICD-10-CM | POA: Diagnosis not present

## 2018-10-26 DIAGNOSIS — I10 Essential (primary) hypertension: Secondary | ICD-10-CM | POA: Diagnosis not present

## 2018-10-26 DIAGNOSIS — Y92007 Garden or yard of unspecified non-institutional (private) residence as the place of occurrence of the external cause: Secondary | ICD-10-CM | POA: Diagnosis not present

## 2018-10-26 DIAGNOSIS — E119 Type 2 diabetes mellitus without complications: Secondary | ICD-10-CM | POA: Insufficient documentation

## 2018-10-26 DIAGNOSIS — M25572 Pain in left ankle and joints of left foot: Secondary | ICD-10-CM | POA: Diagnosis not present

## 2018-10-26 DIAGNOSIS — W19XXXA Unspecified fall, initial encounter: Secondary | ICD-10-CM | POA: Diagnosis not present

## 2018-10-26 DIAGNOSIS — S7011XA Contusion of right thigh, initial encounter: Secondary | ICD-10-CM | POA: Diagnosis not present

## 2018-10-26 DIAGNOSIS — M1611 Unilateral primary osteoarthritis, right hip: Secondary | ICD-10-CM | POA: Diagnosis not present

## 2018-10-26 DIAGNOSIS — M545 Low back pain: Secondary | ICD-10-CM | POA: Diagnosis not present

## 2018-10-26 MED ORDER — HYDROCODONE-ACETAMINOPHEN 5-325 MG PO TABS: 1.0000 | ORAL_TABLET | Freq: Four times a day (QID) | ORAL | 0 refills | Status: DC | PRN

## 2018-10-26 MED ORDER — ACETAMINOPHEN 325 MG PO TABS
650.0000 mg | ORAL_TABLET | Freq: Once | ORAL | Status: AC
  Administered 2018-10-26: 09:00:00 650 mg via ORAL
  Filled 2018-10-26: qty 2

## 2018-10-26 MED ORDER — DOCUSATE SODIUM 100 MG PO CAPS: 100.0000 mg | ORAL_CAPSULE | Freq: Two times a day (BID) | ORAL | 0 refills | Status: DC
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
## 2018-10-26 NOTE — ED Notes (Signed)
Patient able to bear weight to get up to go to urinate.

## 2018-10-26 NOTE — ED Notes (Addendum)
Ambulated patient with walker and 2 person assist. Patient was able to walk just fine however he did report some pain which is chronic-- to right hip. Provider aware.

## 2018-10-26 NOTE — ED Notes (Signed)
Pt transported to XR.  

## 2018-10-26 NOTE — Discharge Instructions (Signed)
Contact a health care provider if: You cannot do your normal activities comfortably. Your joint does not function at all. Your pain is interfering with your sleep. You are gaining weight. Your joint appears to be changing in shape, instead of just being swollen and sore.
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
## 2018-10-28 ENCOUNTER — Other Ambulatory Visit: Payer: Self-pay

## 2018-10-28 ENCOUNTER — Emergency Department (HOSPITAL_COMMUNITY)
Admission: EM | Admit: 2018-10-28 | Discharge: 2018-10-29 | Disposition: A | Discharge status: Home / Self Care | Payer: Medicare Other | Attending: Emergency Medicine | Admitting: Emergency Medicine

## 2018-10-28 ENCOUNTER — Emergency Department (HOSPITAL_COMMUNITY): Payer: Medicare Other

## 2018-10-28 DIAGNOSIS — I10 Essential (primary) hypertension: Secondary | ICD-10-CM | POA: Diagnosis not present

## 2018-10-28 DIAGNOSIS — W19XXXA Unspecified fall, initial encounter: Secondary | ICD-10-CM | POA: Diagnosis not present

## 2018-10-28 DIAGNOSIS — R52 Pain, unspecified: Secondary | ICD-10-CM | POA: Diagnosis not present

## 2018-10-28 DIAGNOSIS — Y939 Activity, unspecified: Secondary | ICD-10-CM | POA: Insufficient documentation

## 2018-10-28 DIAGNOSIS — W010XXA Fall on same level from slipping, tripping and stumbling without subsequent striking against object, initial encounter: Secondary | ICD-10-CM | POA: Insufficient documentation

## 2018-10-28 DIAGNOSIS — M545 Low back pain: Secondary | ICD-10-CM | POA: Diagnosis not present

## 2018-10-28 DIAGNOSIS — S79911A Unspecified injury of right hip, initial encounter: Secondary | ICD-10-CM | POA: Diagnosis not present

## 2018-10-28 DIAGNOSIS — Y929 Unspecified place or not applicable: Secondary | ICD-10-CM | POA: Diagnosis not present

## 2018-10-28 DIAGNOSIS — Y999 Unspecified external cause status: Secondary | ICD-10-CM | POA: Insufficient documentation

## 2018-10-28 DIAGNOSIS — E119 Type 2 diabetes mellitus without complications: Secondary | ICD-10-CM | POA: Insufficient documentation

## 2018-10-28 DIAGNOSIS — M25551 Pain in right hip: Secondary | ICD-10-CM | POA: Diagnosis not present

## 2018-10-28 MED ORDER — METHOCARBAMOL 500 MG PO TABS: 500.0000 mg | ORAL_TABLET | Freq: Three times a day (TID) | ORAL | 0 refills | Status: DC | PRN

## 2018-10-28 MED ORDER — FENTANYL CITRATE (PF) 100 MCG/2ML IJ SOLN
50.0000 ug | Freq: Once | INTRAMUSCULAR | Status: AC
  Administered 2018-10-28: 20:00:00 50 ug via INTRAMUSCULAR
  Filled 2018-10-28: qty 2

## 2018-10-28 NOTE — Discharge Instructions (Addendum)
You were evaluated in the Emergency Department and after careful evaluation, we did not find any emergent condition requiring admission or further testing in the hospital.  Your exam/testing today is overall reassuring.  Your pain seems to be due to bruising from the fall.  Your x-rays did not show any broken bones.  Please rest the injured areas at home for the next several days and use Tylenol or ibuprofen for discomfort.  You can use the Robaxin muscle relaxer for pain at night keeping you from sleeping.  Please use caution as this medication can cause drowsiness.  Please return to the Emergency Department if you experience any worsening of your condition.  We encourage you to follow up with a primary care provider.  Thank you for allowing [place] to be a part of your care.

## 2018-10-28 NOTE — ED Provider Notes (Signed)
[facility] Emergency Department Provider Note MRN:  [id number]  Arrival date & time: 10/28/18     Chief Complaint   Fall and Hip Pain   History of Present Illness   Casey Collins is a 83 y.o. year-old male with a history of fall presenting to the ED with chief complaint of hip pain.  Patient explains that he tripped over his shoe and fell, landing on his right hip.  Endorsing pain to the right hip and lower right back.  Denies chest pain or shortness of breath, no head trauma, no loss consciousness, no blood thinners, no other complaints.  Pain is 8 out of 10, constant, worse with motion.  Review of Systems  A complete 10 system review of systems was obtained and all systems are negative except as noted in the HPI and PMH.   Patient's Health History    Past Medical History:  Diagnosis Date  . Arthritis   . Chronic right hip pain   . Colon polyp   . Depression   . Diverticulosis   . Hyperlipemia   . Hypertension   . Pneumonia X 2  . Stroke [facility]) ~ 2011-2016 X 4   "right side weak since; difficulty walking, speaking, read, eat" (06/06/2015)  . Type II diabetes mellitus ([place])     Past Surgical History:  Procedure Laterality Date  . APPENDECTOMY  06/06/2015  . ARTERY EXPLORATION Left 10/12/2017   Procedure: LEFT UPPER ARM EXPLORATION AND REPAIR OF BRACHIAL ARTEY USING RIGHT LEG SAPHENOUS VEIN;  Surgeon: Rosetta Posner, MD;  Location: [place];  Service: Vascular;  Laterality: Left;  . CATARACT EXTRACTION W/ INTRAOCULAR LENS  IMPLANT, BILATERAL Bilateral 2016  . INGUINAL HERNIA REPAIR Bilateral   . LAPAROSCOPIC APPENDECTOMY N/A 06/06/2015   Procedure: APPENDECTOMY LAPAROSCOPIC;  Surgeon: Ralene Ok, MD;  Location: [place];  Service: General;  Laterality: N/A;  . TONSILLECTOMY    . VEIN HARVEST Right 10/12/2017   Procedure: SAPHENOUS VEIN HARVEST FROM RIGHT UPPER LEG;  Surgeon: Rosetta Posner, MD;  Location: [facility] OR;  Service: Vascular;  Laterality: Right;     Family History  Problem Relation Age of Onset  . Prostate cancer Brother   . Diabetes Daughter   . Colon cancer Neg Hx     Social History   Socioeconomic History  . Marital status: Divorced    Spouse name: Not on file  . Number of children: Not on file  . Years of education: Not on file  . Highest education level: Not on file  Occupational History  . Occupation: Retired   Scientific laboratory technician  . Financial resource strain: Not on file  . Food insecurity    Worry: Not on file    Inability: Not on file  . Transportation needs    Medical: Not on file    Non-medical: Not on file  Tobacco Use  . Smoking status: Never Smoker  . Smokeless tobacco: Never Used  Substance and Sexual Activity  . Alcohol use: No  . Drug use: Yes    Comment: 06/06/2015 quit using marijuana @ second stroke ~ 2013"  . Sexual activity: Not on file  Lifestyle  . Physical activity    Days per week: Not on file    Minutes per session: Not on file  . Stress: Not on file  Relationships  . Social Herbalist on phone: Not on file    Gets together: Not on file    Attends religious  service: Not on file    Active member of club or organization: Not on file    Attends meetings of clubs or organizations: Not on file    Relationship status: Not on file  . Intimate partner violence    Fear of current or ex partner: Not on file    Emotionally abused: Not on file    Physically abused: Not on file    Forced sexual activity: Not on file  Other Topics Concern  . Not on file  Social History Narrative   ** Merged History Encounter **       No caffeine      Physical Exam  Vital Signs and Nursing Notes reviewed Vitals:   10/28/18 1823 10/28/18 2137  BP: (!) 148/103 (!) 151/66  Pulse: 90 (!) 58  Resp: 18 14  SpO2: 100% 100%    CONSTITUTIONAL: Chronically ill-appearing, NAD NEURO:  Alert and oriented x 3, no focal deficits EYES:  eyes equal and reactive ENT/NECK:  no LAD, no JVD CARDIO: Regular  rate, well-perfused, normal S1 and S2 PULM:  CTAB no wheezing or rhonchi GI/GU:  normal bowel sounds, non-distended, non-tender MSK/SPINE:  No gross deformities, no edema; tenderness palpation to the right hip and right lumbar back SKIN:  no rash, atraumatic PSYCH:  Appropriate speech and behavior  Diagnostic and Interventional Summary    Labs Reviewed - No data to display  DG Hip Unilat W or Wo Pelvis 2-3 Views Right  Final Result    DG Lumbar Spine Complete  Final Result      Medications  fentaNYL (SUBLIMAZE) injection 50 mcg (50 mcg Intramuscular Given 10/28/18 2005)     Procedures Critical Care  ED Course and Medical Decision Making  I have reviewed the triage vital signs and the nursing notes.  Pertinent labs & imaging results that were available during my care of the patient were reviewed by me and considered in my medical decision making (see below for details).  X-ray to exclude fracture, results pending.  X-rays are negative.  Patient able to ambulate, largely excluding the possibility of occult fracture.  He is appropriate for discharge.  Barth Kirks. Sedonia Small, [place] [email or web address]  Final Clinical Impressions(s) / ED Diagnoses     ICD-10-CM   1. Right hip pain  M25.551     ED Discharge Orders         Ordered    methocarbamol (ROBAXIN) 500 MG tablet  Every 8 hours PRN     10/28/18 2258          Discharge Instructions Discussed with and Provided to Patient:   Discharge Instructions     You were evaluated in the Emergency Department and after careful evaluation, we did not find any emergent condition requiring admission or further testing in the hospital.  Your exam/testing today is overall reassuring.  Your pain seems to be due to bruising from the fall.  Your x-rays did not show any broken bones.  Please rest the injured areas at home for the next several days and use Tylenol or ibuprofen for  discomfort.  You can use the Robaxin muscle relaxer for pain at night keeping you from sleeping.  Please use caution as this medication can cause drowsiness.  Please return to the Emergency Department if you experience any worsening of your condition.  We encourage you to follow up with a primary care provider.  Thank you for allowing [place] to be a part  of your care.       Maudie Flakes, MD 10/28/18 2259

## 2018-10-28 NOTE — ED Triage Notes (Signed)
83 yo male brought in by [facility] from home. Son in law states increase frequency in falls for past month. Had a fall yesterday and fell and hit bottom pt and family deny LOC. Today had increased pain in right flank area and hip. Pt hips stable as per ems. Pt denies pain or LOC with fall as per EMS. Left arm restriction due to previous injury several years ago. Hx of stroke, so speech is slurred at baseline. PT takes plavix. Last set of Vitals: bp 163/63 Hr 56 rr 16 spo2 98 on room air cbg 139 Temp 98.6

## 2018-10-28 NOTE — ED Notes (Signed)
Patient transported to X-ray 

## 2018-11-02 DIAGNOSIS — R7989 Other specified abnormal findings of blood chemistry: Secondary | ICD-10-CM | POA: Diagnosis not present

## 2018-11-02 DIAGNOSIS — R350 Frequency of micturition: Secondary | ICD-10-CM | POA: Diagnosis not present

## 2018-11-02 DIAGNOSIS — F0390 Unspecified dementia without behavioral disturbance: Secondary | ICD-10-CM | POA: Diagnosis not present

## 2018-11-02 DIAGNOSIS — R296 Repeated falls: Secondary | ICD-10-CM | POA: Diagnosis not present

## 2018-11-02 DIAGNOSIS — Z23 Encounter for immunization: Secondary | ICD-10-CM | POA: Diagnosis not present

## 2018-11-02 DIAGNOSIS — M25551 Pain in right hip: Secondary | ICD-10-CM | POA: Diagnosis not present

## 2018-11-02 DIAGNOSIS — M1611 Unilateral primary osteoarthritis, right hip: Secondary | ICD-10-CM | POA: Diagnosis not present

## 2018-11-02 DIAGNOSIS — E1151 Type 2 diabetes mellitus with diabetic peripheral angiopathy without gangrene: Secondary | ICD-10-CM | POA: Diagnosis not present

## 2018-11-04 DIAGNOSIS — M533 Sacrococcygeal disorders, not elsewhere classified: Secondary | ICD-10-CM | POA: Diagnosis not present

## 2018-11-05 DIAGNOSIS — Z111 Encounter for screening for respiratory tuberculosis: Secondary | ICD-10-CM | POA: Diagnosis not present

## 2018-11-05 DIAGNOSIS — R82998 Other abnormal findings in urine: Secondary | ICD-10-CM | POA: Diagnosis not present

## 2018-11-09 DIAGNOSIS — Z20828 Contact with and (suspected) exposure to other viral communicable diseases: Secondary | ICD-10-CM | POA: Diagnosis not present

## 2018-11-16 DIAGNOSIS — E119 Type 2 diabetes mellitus without complications: Secondary | ICD-10-CM | POA: Diagnosis not present

## 2018-11-16 DIAGNOSIS — N189 Chronic kidney disease, unspecified: Secondary | ICD-10-CM | POA: Diagnosis not present

## 2018-11-16 DIAGNOSIS — I10 Essential (primary) hypertension: Secondary | ICD-10-CM | POA: Diagnosis not present

## 2018-11-16 DIAGNOSIS — I6930 Unspecified sequelae of cerebral infarction: Secondary | ICD-10-CM | POA: Diagnosis not present

## 2018-11-16 DIAGNOSIS — F0391 Unspecified dementia with behavioral disturbance: Secondary | ICD-10-CM | POA: Diagnosis not present

## 2018-11-16 DIAGNOSIS — E785 Hyperlipidemia, unspecified: Secondary | ICD-10-CM | POA: Diagnosis not present

## 2018-11-16 DIAGNOSIS — F331 Major depressive disorder, recurrent, moderate: Secondary | ICD-10-CM | POA: Diagnosis not present

## 2018-11-16 DIAGNOSIS — M1611 Unilateral primary osteoarthritis, right hip: Secondary | ICD-10-CM | POA: Diagnosis not present

## 2018-11-18 DIAGNOSIS — E1122 Type 2 diabetes mellitus with diabetic chronic kidney disease: Secondary | ICD-10-CM | POA: Diagnosis not present

## 2018-11-18 DIAGNOSIS — F329 Major depressive disorder, single episode, unspecified: Secondary | ICD-10-CM | POA: Diagnosis not present

## 2018-11-18 DIAGNOSIS — F0280 Dementia in other diseases classified elsewhere without behavioral disturbance: Secondary | ICD-10-CM | POA: Diagnosis not present

## 2018-11-18 DIAGNOSIS — I129 Hypertensive chronic kidney disease with stage 1 through stage 4 chronic kidney disease, or unspecified chronic kidney disease: Secondary | ICD-10-CM | POA: Diagnosis not present

## 2018-11-18 DIAGNOSIS — M1611 Unilateral primary osteoarthritis, right hip: Secondary | ICD-10-CM | POA: Diagnosis not present

## 2018-11-18 DIAGNOSIS — G309 Alzheimer's disease, unspecified: Secondary | ICD-10-CM | POA: Diagnosis not present

## 2018-11-18 DIAGNOSIS — Z794 Long term (current) use of insulin: Secondary | ICD-10-CM | POA: Diagnosis not present

## 2018-11-18 DIAGNOSIS — I69351 Hemiplegia and hemiparesis following cerebral infarction affecting right dominant side: Secondary | ICD-10-CM | POA: Diagnosis not present

## 2018-11-19 DIAGNOSIS — M545 Low back pain: Secondary | ICD-10-CM | POA: Diagnosis not present

## 2018-11-24 DIAGNOSIS — M545 Low back pain: Secondary | ICD-10-CM | POA: Diagnosis not present

## 2018-11-24 DIAGNOSIS — M25551 Pain in right hip: Secondary | ICD-10-CM | POA: Diagnosis not present

## 2018-12-01 ENCOUNTER — Emergency Department (HOSPITAL_COMMUNITY)
Admission: EM | Admit: 2018-12-01 | Discharge: 2018-12-02 | Disposition: A | Discharge status: Skilled Nursing Facility | Payer: Medicare Other | Attending: Emergency Medicine | Admitting: Emergency Medicine

## 2018-12-01 ENCOUNTER — Encounter (HOSPITAL_COMMUNITY): Payer: Self-pay | Admitting: Emergency Medicine

## 2018-12-01 DIAGNOSIS — I10 Essential (primary) hypertension: Secondary | ICD-10-CM | POA: Diagnosis not present

## 2018-12-01 DIAGNOSIS — R112 Nausea with vomiting, unspecified: Secondary | ICD-10-CM | POA: Diagnosis present

## 2018-12-01 DIAGNOSIS — Z8673 Personal history of transient ischemic attack (TIA), and cerebral infarction without residual deficits: Secondary | ICD-10-CM | POA: Diagnosis not present

## 2018-12-01 DIAGNOSIS — U071 COVID-19: Secondary | ICD-10-CM | POA: Insufficient documentation

## 2018-12-01 DIAGNOSIS — Z79899 Other long term (current) drug therapy: Secondary | ICD-10-CM | POA: Diagnosis not present

## 2018-12-01 DIAGNOSIS — Z209 Contact with and (suspected) exposure to unspecified communicable disease: Secondary | ICD-10-CM | POA: Diagnosis not present

## 2018-12-01 DIAGNOSIS — Z794 Long term (current) use of insulin: Secondary | ICD-10-CM | POA: Diagnosis not present

## 2018-12-01 DIAGNOSIS — E119 Type 2 diabetes mellitus without complications: Secondary | ICD-10-CM | POA: Diagnosis not present

## 2018-12-01 DIAGNOSIS — R531 Weakness: Secondary | ICD-10-CM | POA: Diagnosis not present

## 2018-12-01 DIAGNOSIS — Z7982 Long term (current) use of aspirin: Secondary | ICD-10-CM | POA: Insufficient documentation

## 2018-12-01 DIAGNOSIS — R404 Transient alteration of awareness: Secondary | ICD-10-CM | POA: Diagnosis not present

## 2018-12-01 DIAGNOSIS — R52 Pain, unspecified: Secondary | ICD-10-CM | POA: Diagnosis not present

## 2018-12-01 DIAGNOSIS — R000 Tachycardia, unspecified: Secondary | ICD-10-CM | POA: Diagnosis not present

## 2018-12-01 LAB — CBC
HCT: 37.3 % — ABNORMAL LOW (ref 39.0–52.0)
Hemoglobin: 11.7 g/dL — ABNORMAL LOW (ref 13.0–17.0)
MCH: 30.6 pg (ref 26.0–34.0)
MCHC: 31.4 g/dL (ref 30.0–36.0)
MCV: 97.6 fL (ref 80.0–100.0)
Platelets: 207 10*3/uL (ref 150–400)
RBC: 3.82 MIL/uL — ABNORMAL LOW (ref 4.22–5.81)
RDW: 12.7 % (ref 11.5–15.5)
WBC: 6.5 10*3/uL (ref 4.0–10.5)
nRBC: 0 % (ref 0.0–0.2)

## 2018-12-01 LAB — COMPREHENSIVE METABOLIC PANEL
ALT: 16 U/L (ref 0–44)
AST: 24 U/L (ref 15–41)
Albumin: 3.8 g/dL (ref 3.5–5.0)
Alkaline Phosphatase: 76 U/L (ref 38–126)
Anion gap: 9 (ref 5–15)
BUN: 22 mg/dL (ref 8–23)
CO2: 26 mmol/L (ref 22–32)
Calcium: 8.8 mg/dL — ABNORMAL LOW (ref 8.9–10.3)
Chloride: 104 mmol/L (ref 98–111)
Creatinine, Ser: 1.24 mg/dL (ref 0.61–1.24)
GFR calc Af Amer: >60 mL/min (ref >60)
GFR calc non Af Amer: 54 mL/min — ABNORMAL LOW (ref >60)
Glucose, Bld: 59 mg/dL — ABNORMAL LOW (ref 70–99)
Potassium: 4.1 mmol/L (ref 3.5–5.1)
Sodium: 139 mmol/L (ref 135–145)
Total Bilirubin: 0.7 mg/dL (ref 0.3–1.2)
Total Protein: 6.6 g/dL (ref 6.5–8.1)

## 2018-12-01 LAB — SARS CORONAVIRUS 2 BY RT PCR (HOSPITAL ORDER, PERFORMED IN ~~LOC~~ HOSPITAL LAB): SARS Coronavirus 2: POSITIVE — AB

## 2018-12-01 LAB — LACTIC ACID, PLASMA: Lactic Acid, Venous: 1.2 mmol/L (ref 0.5–1.9)

## 2018-12-01 LAB — LIPASE, BLOOD: Lipase: 31 U/L (ref 11–51)

## 2018-12-01 MED ORDER — ONDANSETRON 4 MG PO TBDP: ORAL_TABLET | ORAL | 0 refills | Status: AC

## 2018-12-01 NOTE — ED Notes (Signed)
Date and time results received: 12/01/18 3:45 PM  (use smartphrase ".now" to insert current time)  Test: COVID Critical Value: COVID +  Name of Provider Notified: Roderic Palau, MD  Orders Received? Or Actions Taken?: Will continue to monitor

## 2018-12-01 NOTE — ED Notes (Signed)
[facility] called for update on patient. RN told them that patient just tested positive for COVID.

## 2018-12-01 NOTE — ED Notes (Addendum)
Saw patient standing beside bed and had diarrhea on the floor. Cleaned up patient and floor, patient is now clean and dry in bed. Put patient on an adult brief and chuck pad.

## 2018-12-01 NOTE — ED Triage Notes (Signed)
Patient here from [facility] via EMS with complaints of n/v/d x1 day. COVID exposure. Roommate tested positive.

## 2018-12-01 NOTE — ED Notes (Signed)
Pt tolerated PO fluids

## 2018-12-01 NOTE — Discharge Instructions (Addendum)
Drink plenty of fluids and follow the COVID-19 instructions.   Follow-up with your doctor if any problem

## 2018-12-01 NOTE — ED Notes (Signed)
Called [facility] to transport patient to [facility], was told "it may be a while" but they will be here ASAP.

## 2018-12-01 NOTE — ED Notes (Signed)
Pt took off BP cuff and O2 monitor, does not want to keep on until [facility] arrives.

## 2018-12-01 NOTE — ED Notes (Signed)
[facility] to give update on patient's discharge, left a message.
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
## 2018-12-05 ENCOUNTER — Emergency Department (HOSPITAL_COMMUNITY)
Admission: EM | Admit: 2018-12-05 | Discharge: 2018-12-05 | Disposition: A | Discharge status: Home / Self Care | Payer: Medicare Other | Attending: Emergency Medicine | Admitting: Emergency Medicine

## 2018-12-05 ENCOUNTER — Other Ambulatory Visit: Payer: Self-pay

## 2018-12-05 ENCOUNTER — Emergency Department (HOSPITAL_COMMUNITY): Payer: Medicare Other

## 2018-12-05 ENCOUNTER — Encounter (HOSPITAL_COMMUNITY): Payer: Self-pay | Admitting: Emergency Medicine

## 2018-12-05 DIAGNOSIS — R52 Pain, unspecified: Secondary | ICD-10-CM | POA: Diagnosis not present

## 2018-12-05 DIAGNOSIS — E119 Type 2 diabetes mellitus without complications: Secondary | ICD-10-CM | POA: Diagnosis not present

## 2018-12-05 DIAGNOSIS — U071 COVID-19: Secondary | ICD-10-CM | POA: Diagnosis not present

## 2018-12-05 DIAGNOSIS — Z7982 Long term (current) use of aspirin: Secondary | ICD-10-CM | POA: Insufficient documentation

## 2018-12-05 DIAGNOSIS — G8929 Other chronic pain: Secondary | ICD-10-CM | POA: Insufficient documentation

## 2018-12-05 DIAGNOSIS — I10 Essential (primary) hypertension: Secondary | ICD-10-CM | POA: Insufficient documentation

## 2018-12-05 DIAGNOSIS — Z79899 Other long term (current) drug therapy: Secondary | ICD-10-CM | POA: Insufficient documentation

## 2018-12-05 DIAGNOSIS — Z7902 Long term (current) use of antithrombotics/antiplatelets: Secondary | ICD-10-CM | POA: Diagnosis not present

## 2018-12-05 DIAGNOSIS — M25551 Pain in right hip: Secondary | ICD-10-CM | POA: Diagnosis not present

## 2018-12-05 DIAGNOSIS — Z794 Long term (current) use of insulin: Secondary | ICD-10-CM | POA: Insufficient documentation

## 2018-12-05 DIAGNOSIS — M5489 Other dorsalgia: Secondary | ICD-10-CM | POA: Diagnosis not present

## 2018-12-05 DIAGNOSIS — M545 Low back pain: Secondary | ICD-10-CM | POA: Diagnosis not present

## 2018-12-05 DIAGNOSIS — M1611 Unilateral primary osteoarthritis, right hip: Secondary | ICD-10-CM | POA: Diagnosis not present

## 2018-12-05 DIAGNOSIS — Z8673 Personal history of transient ischemic attack (TIA), and cerebral infarction without residual deficits: Secondary | ICD-10-CM | POA: Insufficient documentation

## 2018-12-05 MED ORDER — METHOCARBAMOL 500 MG PO TABS
500.0000 mg | ORAL_TABLET | Freq: Once | ORAL | Status: AC
  Administered 2018-12-05: 500 mg via ORAL
  Filled 2018-12-05: qty 1

## 2018-12-05 MED ORDER — HYDROCODONE-ACETAMINOPHEN 5-325 MG PO TABS
1.0000 | ORAL_TABLET | Freq: Once | ORAL | Status: AC
  Administered 2018-12-05: 06:00:00 1 via ORAL
  Filled 2018-12-05: qty 1

## 2018-12-05 NOTE — ED Triage Notes (Signed)
Pt arrived via EMS from [facility]. Pt was given hydrocodone-0acetaminophen tablet at [facility] at [phone] for chronic back pain. EMS arrived at [facility] because staff said the pain medication did not work. Pt also complaining of headache. EMS states that pt smells like a UTI. Pt has hx of dementia, is oriented to baseline. Pt has hx of diabetes

## 2018-12-05 NOTE — Discharge Instructions (Addendum)
Please continue your hydrocodone and Robaxin as prescribed for pain.  Your x-ray showed no acute abnormality today.

## 2018-12-05 NOTE — ED Provider Notes (Signed)
TIME SEEN: 5:18 AM  CHIEF COMPLAINT: Chronic back and hip pain  HPI: Patient is an 83 year old male with history of hypertension, diabetes, stroke, chronic right hip pain who presents to the emergency department with increasing chronic right hip pain that radiates into his back.  Casey Collins states that the back pain is because of his hip.  Casey Collins denies any new injury or falls.  Casey Collins is coming from [facility].  Has history of dementia.  Recently tested Covid positive.  No numbness or weakness.  Wears a diaper at baseline.  Spoke to [place] at [facility] who cares for the patient.  She states that Casey Collins is a new resident for them.  She states tonight Casey Collins was complaining of his hip and back hurting and was having a hard time sitting in his chair because of pain.  She gave him his hydrocodone 5 mg without any relief.  She then gave him 2 Tylenol without any relief.  She states that patient "insisted on going to the ER" for evaluation.  Casey Collins has Robaxin on his MAR but this was not given prior to transport to the ED.  No known falls.  ROS: Level 5 caveat secondary to dementia  PAST MEDICAL HISTORY/PAST SURGICAL HISTORY:  Past Medical History:  Diagnosis Date  . Arthritis   . Chronic right hip pain   . Colon polyp   . Depression   . Diverticulosis   . Hyperlipemia   . Hypertension   . Pneumonia X 2  . Stroke [facility]) ~ 2011-2016 X 4   "right side weak since; difficulty walking, speaking, read, eat" (06/06/2015)  . Type II diabetes mellitus (HCC)     MEDICATIONS:  Prior to Admission medications   Medication Sig Start Date End Date Taking? Authorizing Provider  acetaminophen (TYLENOL) 325 MG tablet Take 1-2 tablets (325-650 mg total) by mouth every 4 (four) hours as needed for mild pain. Patient not taking: Reported on 10/28/2018 11/07/17   Angiulli, Lavon Paganini, PA-C  Ascorbic Acid (VITAMIN C) 1000 MG tablet Take 1 tablet (1,000 mg total) by mouth daily. 11/07/17   Angiulli, Lavon Paganini, PA-C  aspirin EC 81 MG  tablet Take 81 mg by mouth daily.    [provider]  citalopram (CELEXA) 10 MG tablet Take 1 tablet (10 mg total) by mouth daily. 11/07/17   Angiulli, Lavon Paganini, PA-C  clopidogrel (PLAVIX) 75 MG tablet Take 1 tablet (75 mg total) by mouth daily. 11/07/17   Angiulli, Lavon Paganini, PA-C  docusate sodium (COLACE) 100 MG capsule Take 1 capsule (100 mg total) by mouth every 12 (twelve) hours. Patient not taking: Reported on 12/01/2018 10/26/18   Margarita Mail, PA-C  donepezil (ARICEPT) 5 MG tablet Take 5 mg by mouth at bedtime.    [provider]  gabapentin (NEURONTIN) 300 MG capsule Take 300 mg by mouth 3 (three) times daily. 11/12/18   [provider]  gabapentin (NEURONTIN) 600 MG tablet Take 0.5 tablets (300 mg total) by mouth 3 (three) times daily. Patient not taking: Reported on 10/26/2018 11/07/17   Angiulli, Lavon Paganini, PA-C  HYDROcodone-acetaminophen (NORCO) 5-325 MG tablet Take 1 tablet by mouth every 6 (six) hours as needed for moderate pain. 10/26/18   Margarita Mail, PA-C  Insulin Glargine (BASAGLAR KWIKPEN) 100 UNIT/ML SOPN Inject 0.1 mLs (10 Units total) into the skin 2 (two) times daily. 11/07/17   Angiulli, Lavon Paganini, PA-C  insulin regular (NOVOLIN R) 100 units/mL injection Inject 5 Units into the skin every morning.  [provider]  lisinopril (ZESTRIL) 10 MG tablet TAKE 1 TABLET BY MOUTH DAILY Patient taking differently: Take 10 mg by mouth daily.  10/12/18   Miquel Dunn, NP  methocarbamol (ROBAXIN) 500 MG tablet Take 1 tablet (500 mg total) by mouth every 8 (eight) hours as needed for muscle spasms. Patient taking differently: Take 500 mg by mouth 3 (three) times daily as needed for muscle spasms.  10/28/18   Maudie Flakes, MD  Multiple Vitamin (MULTIVITAMIN) tablet Take 1 tablet by mouth daily.    [provider]  ondansetron (ZOFRAN ODT) 4 MG disintegrating tablet 4mg  ODT q4 hours prn nausea/vomit 12/01/18   Milton Ferguson, MD   ramipril (ALTACE) 5 MG capsule Take 1 capsule (5 mg total) by mouth daily. 11/07/17   Angiulli, Lavon Paganini, PA-C  simvastatin (ZOCOR) 40 MG tablet Take 1 tablet (40 mg total) by mouth at bedtime. Patient taking differently: Take 40 mg by mouth every morning.  11/07/17   Angiulli, Lavon Paganini, PA-C    ALLERGIES:  No Known Allergies  SOCIAL HISTORY:  Social History   Tobacco Use  . Smoking status: Never Smoker  . Smokeless tobacco: Never Used  Substance Use Topics  . Alcohol use: No    FAMILY HISTORY: Family History  Problem Relation Age of Onset  . Prostate cancer Brother   . Diabetes Daughter   . Colon cancer Neg Hx     EXAM: BP (!) 150/53   Pulse (!) 55   Temp 97.7 F (36.5 C) (Oral)   Resp 17   Ht 5\' 10"  (1.778 m)   Wt 79.4 kg   SpO2 99%   BMI 25.11 kg/m  CONSTITUTIONAL: Alert and oriented to person and place but not year and responds appropriately to questions. Well-appearing; well-nourished, elderly, no distress HEAD: Normocephalic EYES: Conjunctivae clear, pupils appear equal, EOMI ENT: normal nose; moist mucous membranes NECK: Supple, no meningismus, no nuchal rigidity, no LAD  CARD: RRR; S1 and S2 appreciated; no murmurs, no clicks, no rubs, no gallops RESP: Normal chest excursion without splinting or tachypnea; breath sounds clear and equal bilaterally; no wheezes, no rhonchi, no rales, no hypoxia or respiratory distress, speaking full sentences ABD/GI: Normal bowel sounds; non-distended; soft, non-tender, no rebound, no guarding, no peritoneal signs, no hepatosplenomegaly BACK:  The back appears normal and is tender over the right paraspinal lumbar muscles, no midline spinal tenderness, step-off or deformity EXT: Normal ROM in all joints; has pain in the right hip with internal and external rotation but is able to flex and extend it fully without difficulty.  There is no redness, warmth or obvious swelling of this joint.  Casey Collins has a right 2+ DP pulse on exam.  Right  knee is nontender to palpation.  No joint effusions appreciated.  Calf is nontender to palpation.  No swelling, ecchymosis, edema. SKIN: Normal color for age and race; warm; no rash NEURO: Moves all extremities equally, reports normal sensation diffusely, no saddle anesthesia, no clonus, diminished reflexes bilaterally PSYCH: The patient's mood and manner are appropriate. Grooming and personal hygiene are appropriate.  MEDICAL DECISION MAKING: Patient here with an exacerbation of his chronic right hip pain and chronic back pain.  No focal neurologic deficits currently.  Will obtain x-rays to ensure there is no fracture given patient is elderly and also has dementia unable to provide history.  Casey Collins denies that Casey Collins has had any falls as does the nursing home staff.  Will give dose of his hydrocodone  as well as Robaxin here in the emergency department.  Doubt epidural abscess or hematoma, discitis or osteomyelitis, transverse myelitis, epidural hematoma.  ED PROGRESS: X-ray showed no acute change.  Pain has improved.  Will discharge back to nursing home.   At this time, I do not feel there is any life-threatening condition present. I have reviewed, interpreted and discussed all results (EKG, imaging, lab, urine as appropriate) and exam findings with patient/family. I have reviewed nursing notes and appropriate previous records.  I feel the patient is safe to be discharged home without further emergent workup and can continue workup as an outpatient as needed. Discussed usual and customary return precautions. Patient/family verbalize understanding and are comfortable with this plan.  Outpatient follow-up has been provided as needed. All questions have been answered.   Casey Collins was evaluated in Emergency Department on 12/05/2018 for the symptoms described in the history of present illness. Casey Collins was evaluated in the context of the global COVID-19 pandemic, which necessitated consideration that the  patient might be at risk for infection with the SARS-CoV-2 virus that causes COVID-19. Institutional protocols and algorithms that pertain to the evaluation of patients at risk for COVID-19 are in a state of rapid change based on information released by regulatory bodies including the CDC and federal and state organizations. These policies and algorithms were followed during the patient's care in the ED.    Kieron Kantner, Delice Bison, DO 12/05/18 [phone]

## 2018-12-05 NOTE — ED Notes (Signed)
[facility] called  

## 2018-12-07 DIAGNOSIS — M1611 Unilateral primary osteoarthritis, right hip: Secondary | ICD-10-CM | POA: Diagnosis not present

## 2018-12-07 DIAGNOSIS — F0390 Unspecified dementia without behavioral disturbance: Secondary | ICD-10-CM | POA: Diagnosis not present

## 2018-12-07 DIAGNOSIS — U071 COVID-19: Secondary | ICD-10-CM | POA: Diagnosis not present

## 2018-12-07 DIAGNOSIS — E119 Type 2 diabetes mellitus without complications: Secondary | ICD-10-CM | POA: Diagnosis not present

## 2018-12-07 DIAGNOSIS — M545 Low back pain: Secondary | ICD-10-CM | POA: Diagnosis not present

## 2018-12-10 ENCOUNTER — Other Ambulatory Visit: Payer: Self-pay

## 2018-12-18 DIAGNOSIS — I129 Hypertensive chronic kidney disease with stage 1 through stage 4 chronic kidney disease, or unspecified chronic kidney disease: Secondary | ICD-10-CM | POA: Diagnosis not present

## 2018-12-18 DIAGNOSIS — M1611 Unilateral primary osteoarthritis, right hip: Secondary | ICD-10-CM | POA: Diagnosis not present

## 2018-12-18 DIAGNOSIS — G309 Alzheimer's disease, unspecified: Secondary | ICD-10-CM | POA: Diagnosis not present

## 2018-12-18 DIAGNOSIS — I69351 Hemiplegia and hemiparesis following cerebral infarction affecting right dominant side: Secondary | ICD-10-CM | POA: Diagnosis not present

## 2018-12-18 DIAGNOSIS — F329 Major depressive disorder, single episode, unspecified: Secondary | ICD-10-CM | POA: Diagnosis not present

## 2018-12-18 DIAGNOSIS — Z794 Long term (current) use of insulin: Secondary | ICD-10-CM | POA: Diagnosis not present

## 2018-12-18 DIAGNOSIS — F0280 Dementia in other diseases classified elsewhere without behavioral disturbance: Secondary | ICD-10-CM | POA: Diagnosis not present

## 2018-12-18 DIAGNOSIS — E1122 Type 2 diabetes mellitus with diabetic chronic kidney disease: Secondary | ICD-10-CM | POA: Diagnosis not present

## 2018-12-27 ENCOUNTER — Emergency Department (HOSPITAL_COMMUNITY)
Admission: EM | Admit: 2018-12-27 | Discharge: 2018-12-27 | Disposition: A | Discharge status: Home / Self Care | Payer: Medicare Other | Attending: Emergency Medicine | Admitting: Emergency Medicine

## 2018-12-27 ENCOUNTER — Emergency Department (HOSPITAL_COMMUNITY): Payer: Medicare Other

## 2018-12-27 ENCOUNTER — Other Ambulatory Visit: Payer: Self-pay

## 2018-12-27 DIAGNOSIS — I10 Essential (primary) hypertension: Secondary | ICD-10-CM | POA: Insufficient documentation

## 2018-12-27 DIAGNOSIS — R4781 Slurred speech: Secondary | ICD-10-CM | POA: Diagnosis not present

## 2018-12-27 DIAGNOSIS — Z8673 Personal history of transient ischemic attack (TIA), and cerebral infarction without residual deficits: Secondary | ICD-10-CM | POA: Diagnosis not present

## 2018-12-27 DIAGNOSIS — R296 Repeated falls: Secondary | ICD-10-CM

## 2018-12-27 DIAGNOSIS — I491 Atrial premature depolarization: Secondary | ICD-10-CM | POA: Diagnosis not present

## 2018-12-27 DIAGNOSIS — Z7982 Long term (current) use of aspirin: Secondary | ICD-10-CM | POA: Insufficient documentation

## 2018-12-27 DIAGNOSIS — S0990XA Unspecified injury of head, initial encounter: Secondary | ICD-10-CM | POA: Diagnosis not present

## 2018-12-27 DIAGNOSIS — Z7902 Long term (current) use of antithrombotics/antiplatelets: Secondary | ICD-10-CM | POA: Diagnosis not present

## 2018-12-27 DIAGNOSIS — F0390 Unspecified dementia without behavioral disturbance: Secondary | ICD-10-CM | POA: Insufficient documentation

## 2018-12-27 DIAGNOSIS — Z794 Long term (current) use of insulin: Secondary | ICD-10-CM | POA: Insufficient documentation

## 2018-12-27 DIAGNOSIS — Z20828 Contact with and (suspected) exposure to other viral communicable diseases: Secondary | ICD-10-CM | POA: Insufficient documentation

## 2018-12-27 DIAGNOSIS — E119 Type 2 diabetes mellitus without complications: Secondary | ICD-10-CM | POA: Insufficient documentation

## 2018-12-27 DIAGNOSIS — Z79899 Other long term (current) drug therapy: Secondary | ICD-10-CM | POA: Insufficient documentation

## 2018-12-27 DIAGNOSIS — R509 Fever, unspecified: Secondary | ICD-10-CM | POA: Diagnosis not present

## 2018-12-27 DIAGNOSIS — E1165 Type 2 diabetes mellitus with hyperglycemia: Secondary | ICD-10-CM | POA: Diagnosis not present

## 2018-12-27 DIAGNOSIS — R531 Weakness: Secondary | ICD-10-CM | POA: Diagnosis not present

## 2018-12-27 DIAGNOSIS — R29810 Facial weakness: Secondary | ICD-10-CM | POA: Diagnosis not present

## 2018-12-27 DIAGNOSIS — R0902 Hypoxemia: Secondary | ICD-10-CM | POA: Diagnosis not present

## 2018-12-27 LAB — URINALYSIS, ROUTINE W REFLEX MICROSCOPIC
Bacteria, UA: NONE SEEN
Bilirubin Urine: NEGATIVE
Glucose, UA: 50 mg/dL — AB
Hgb urine dipstick: NEGATIVE
Ketones, ur: NEGATIVE mg/dL
Leukocytes,Ua: NEGATIVE
Nitrite: NEGATIVE
Protein, ur: 30 mg/dL — AB
Specific Gravity, Urine: 1.016 (ref 1.005–1.030)
pH: 6 (ref 5.0–8.0)

## 2018-12-27 LAB — CBC WITH DIFFERENTIAL/PLATELET
Abs Immature Granulocytes: 0.07 10*3/uL (ref 0.00–0.07)
Basophils Absolute: 0 10*3/uL (ref 0.0–0.1)
Basophils Relative: 0 %
Eosinophils Absolute: 0.1 10*3/uL (ref 0.0–0.5)
Eosinophils Relative: 1 %
HCT: 36.2 % — ABNORMAL LOW (ref 39.0–52.0)
Hemoglobin: 11.3 g/dL — ABNORMAL LOW (ref 13.0–17.0)
Immature Granulocytes: 1 %
Lymphocytes Relative: 6 %
Lymphs Abs: 0.8 10*3/uL (ref 0.7–4.0)
MCH: 30.5 pg (ref 26.0–34.0)
MCHC: 31.2 g/dL (ref 30.0–36.0)
MCV: 97.6 fL (ref 80.0–100.0)
Monocytes Absolute: 1.5 10*3/uL — ABNORMAL HIGH (ref 0.1–1.0)
Monocytes Relative: 12 %
Neutro Abs: 10.7 10*3/uL — ABNORMAL HIGH (ref 1.7–7.7)
Neutrophils Relative %: 80 %
Platelets: 206 10*3/uL (ref 150–400)
RBC: 3.71 MIL/uL — ABNORMAL LOW (ref 4.22–5.81)
RDW: 13.4 % (ref 11.5–15.5)
WBC: 13.2 10*3/uL — ABNORMAL HIGH (ref 4.0–10.5)
nRBC: 0 % (ref 0.0–0.2)

## 2018-12-27 LAB — POC SARS CORONAVIRUS 2 AG -  ED: SARS Coronavirus 2 Ag: NEGATIVE

## 2018-12-27 LAB — BASIC METABOLIC PANEL
Anion gap: 11 (ref 5–15)
BUN: 28 mg/dL — ABNORMAL HIGH (ref 8–23)
CO2: 25 mmol/L (ref 22–32)
Calcium: 8.7 mg/dL — ABNORMAL LOW (ref 8.9–10.3)
Chloride: 99 mmol/L (ref 98–111)
Creatinine, Ser: 1.5 mg/dL — ABNORMAL HIGH (ref 0.61–1.24)
GFR calc Af Amer: 50 mL/min — ABNORMAL LOW (ref >60)
GFR calc non Af Amer: 43 mL/min — ABNORMAL LOW (ref >60)
Glucose, Bld: 190 mg/dL — ABNORMAL HIGH (ref 70–99)
Potassium: 4.5 mmol/L (ref 3.5–5.1)
Sodium: 135 mmol/L (ref 135–145)

## 2018-12-27 LAB — CBG MONITORING, ED: Glucose-Capillary: 198 mg/dL — ABNORMAL HIGH (ref 70–99)

## 2018-12-27 MED ORDER — DOXYCYCLINE HYCLATE 100 MG PO TABS
100.0000 mg | ORAL_TABLET | Freq: Once | ORAL | Status: AC
  Administered 2018-12-27: 100 mg via ORAL
  Filled 2018-12-27: qty 1

## 2018-12-27 MED ORDER — DOXYCYCLINE HYCLATE 100 MG PO CAPS: 100.0000 mg | ORAL_CAPSULE | Freq: Two times a day (BID) | ORAL | 0 refills | Status: DC

## 2018-12-27 NOTE — ED Notes (Signed)
Pt's belongings sent home with pt. Pt has two watches in his bag- a silver and black one.

## 2018-12-27 NOTE — ED Notes (Signed)
[facility] called for patient transport back to facility.  

## 2018-12-27 NOTE — ED Notes (Signed)
Pt transported back to room from CT.  

## 2018-12-27 NOTE — ED Notes (Signed)
Pt transported to CT ?

## 2018-12-27 NOTE — ED Notes (Signed)
Pt attempting to provide urine sample. Pt denies wanting staff to complete the ordered in and out cath

## 2018-12-27 NOTE — ED Triage Notes (Signed)
83 yo BIB from [facility] for unwitnessed fall. Pt denies any neck or back pain as per EMS. Pt is demented at baseline. Facility states pt has fallen 5 times in the last week, and patient appears extremely weak which is out of the ordinary as per facility. Pt has residual deficits to left upper extremity from previous stroke. EMS states they noticed facial droop on right side and slurred speech, facility states this is not his norm but was unable to pinpoint when the change occurred over the last week. Facility also states that pt has had increased urination.   Vitals: bp 108/60 Hr 80 spo2 98 on room air rr 20 cbg 300 Temp 99.1

## 2018-12-27 NOTE — ED Provider Notes (Signed)
[facility] DEPT Provider Note: Georgena Spurling, MD, FACEP  CSN: [id number] MRN: [id number] ARRIVAL: 12/27/18 at [place]: [place] and Weakness  Level 5 caveat: Dementia HISTORY OF PRESENT ILLNESS  12/27/18 1:21 AM Casey Collins is a 83 y.o. male with a history of dementia.  He was sent from his living facility for an unwitnessed fall.  No injuries reported.  The facility reports he has fallen about 5 times in the past week and that he appears extremely weak which is not his baseline.  He has residual left-sided deficit due to previous stroke.  They report new right-sided facial droop and slurred speech but cannot state when this began.  He has also had increased urination.  He was noted to have a temperature of 99.9 on arrival.  The patient is able to tell me he is having back pain which he states he always has but cannot give much more of a history.   Past Medical History:  Diagnosis Date   Arthritis    Chronic right hip pain    Colon polyp    Depression    Diverticulosis    Hyperlipemia    Hypertension    Pneumonia X 2   Stroke ([place]) ~ 2011-2016 X 4   "right side weak since; difficulty walking, speaking, read, eat" (06/06/2015)   Type II diabetes mellitus ([place])     Past Surgical History:  Procedure Laterality Date   APPENDECTOMY  06/06/2015   ARTERY EXPLORATION Left 10/12/2017   Procedure: LEFT UPPER ARM EXPLORATION AND REPAIR OF BRACHIAL ARTEY USING RIGHT LEG SAPHENOUS VEIN;  Surgeon: Rosetta Posner, MD;  Location: [facility] OR;  Service: Vascular;  Laterality: Left;   CATARACT EXTRACTION W/ INTRAOCULAR LENS  IMPLANT, BILATERAL Bilateral 2016   INGUINAL HERNIA REPAIR Bilateral    LAPAROSCOPIC APPENDECTOMY N/A 06/06/2015   Procedure: APPENDECTOMY LAPAROSCOPIC;  Surgeon: Ralene Ok, MD;  Location: [facility];  Service: General;  Laterality: N/A;   TONSILLECTOMY     VEIN HARVEST Right 10/12/2017   Procedure: SAPHENOUS VEIN HARVEST  FROM RIGHT UPPER LEG;  Surgeon: Rosetta Posner, MD;  Location: [facility] OR;  Service: Vascular;  Laterality: Right;    Family History  Problem Relation Age of Onset   Prostate cancer Brother    Diabetes Daughter    Colon cancer Neg Hx     Social History   Tobacco Use   Smoking status: Never Smoker   Smokeless tobacco: Never Used  Substance Use Topics   Alcohol use: No   Drug use: Yes    Comment: 06/06/2015 quit using marijuana @ second stroke ~ 2013"    Prior to Admission medications   Medication Sig Start Date End Date Taking? Authorizing Provider  LORazepam (ATIVAN) 0.5 MG tablet Take 0.5 mg by mouth 3 (three) times daily as needed for anxiety. 12/04/18  Yes [provider]  acetaminophen (TYLENOL) 325 MG tablet Take 1-2 tablets (325-650 mg total) by mouth every 4 (four) hours as needed for mild pain. Patient not taking: Reported on 10/28/2018 11/07/17   Angiulli, Lavon Paganini, PA-C  Ascorbic Acid (VITAMIN C) 1000 MG tablet Take 1 tablet (1,000 mg total) by mouth daily. 11/07/17   Angiulli, Lavon Paganini, PA-C  aspirin EC 81 MG tablet Take 81 mg by mouth daily.    [provider]  citalopram (CELEXA) 10 MG tablet Take 1 tablet (10 mg total) by mouth daily. 11/07/17   Cathlyn Parsons, PA-C  clopidogrel (PLAVIX) 75 MG tablet Take 1 tablet (75 mg total) by mouth daily. 11/07/17   Angiulli, Lavon Paganini, PA-C  docusate sodium (COLACE) 100 MG capsule Take 1 capsule (100 mg total) by mouth every 12 (twelve) hours. Patient not taking: Reported on 12/01/2018 10/26/18   Margarita Mail, PA-C  donepezil (ARICEPT) 5 MG tablet Take 5 mg by mouth at bedtime.    [provider]  gabapentin (NEURONTIN) 300 MG capsule Take 300 mg by mouth 3 (three) times daily. 11/12/18   [provider]  gabapentin (NEURONTIN) 600 MG tablet Take 0.5 tablets (300 mg total) by mouth 3 (three) times daily. Patient not taking: Reported on 10/26/2018 11/07/17   Angiulli, Lavon Paganini, PA-C    HYDROcodone-acetaminophen (NORCO) 5-325 MG tablet Take 1 tablet by mouth every 6 (six) hours as needed for moderate pain. 10/26/18   Margarita Mail, PA-C  Insulin Glargine (BASAGLAR KWIKPEN) 100 UNIT/ML SOPN Inject 0.1 mLs (10 Units total) into the skin 2 (two) times daily. 11/07/17   Angiulli, Lavon Paganini, PA-C  insulin regular (NOVOLIN R) 100 units/mL injection Inject 5 Units into the skin every morning.    [provider]  lisinopril (ZESTRIL) 10 MG tablet TAKE 1 TABLET BY MOUTH DAILY Patient taking differently: Take 10 mg by mouth daily.  10/12/18   Miquel Dunn, NP  Multiple Vitamin (MULTIVITAMIN) tablet Take 1 tablet by mouth daily.    [provider]  ondansetron (ZOFRAN ODT) 4 MG disintegrating tablet 49m ODT q4 hours prn nausea/vomit 12/01/18   ZMilton Ferguson MD  ramipril (ALTACE) 5 MG capsule Take 1 capsule (5 mg total) by mouth daily. 11/07/17   Angiulli, DLavon Paganini PA-C  simvastatin (ZOCOR) 40 MG tablet Take 1 tablet (40 mg total) by mouth at bedtime. Patient taking differently: Take 40 mg by mouth every morning.  11/07/17   Angiulli, DLavon Paganini PA-C    Allergies Patient has no known allergies.   REVIEW OF SYSTEMS     PHYSICAL EXAMINATION  Initial Vital Signs Blood pressure (!) 113/57, pulse 76, temperature 99.9 F (37.7 C), temperature source Oral, resp. rate 16, SpO2 100 %.  Examination General: Well-developed, well-nourished male in no acute distress; appearance consistent with age of record HENT: normocephalic; atraumatic Eyes: pupils pinpoint; extraocular muscles grossly intact Neck: supple; no spinal tenderness Heart: regular rate and rhythm Lungs: clear to auscultation bilaterally Abdomen: soft; nondistended; nontender; bowel sounds present Extremities: No deformity; trace edema of lower legs; pulses normal Back: No spinal tenderness Neurologic: Awake, alert; minimally verbal; mild right facial droop; left upper extremity paresis Skin:  Warm and dry Psychiatric: Flat affect   RESULTS  Summary of this visit's results, reviewed and interpreted by myself:   EKG Interpretation  Date/Time:  Sunday December 27 2018 01:32:57 EST Ventricular Rate:  64 PR Interval:    QRS Duration: 103 QT Interval:  410 QTC Calculation: 423 R Axis:   1 Text Interpretation: Sinus rhythm Borderline low voltage, extremity leads Abnormal inferior Q waves Rate is faster Confirmed by Joneisha Miles [phone] on 12/27/2018 1:37:44 AM      Laboratory Studies: Results for orders placed or performed during the hospital encounter of 12/27/18 (from the past 24 hour(s))  CBG monitoring, ED     Status: Abnormal   Collection Time: 12/27/18  1:34 AM  Result Value Ref Range   Glucose-Capillary 198 (H) 70 - 99 mg/dL  Urinalysis, Routine w reflex microscopic     Status: Abnormal   Collection Time: 12/27/18  2:05 AM  Result Value Ref Range   Color, Urine YELLOW YELLOW   APPearance CLEAR CLEAR   Specific Gravity, Urine 1.016 1.005 - 1.030   pH 6.0 5.0 - 8.0   Glucose, UA 50 (A) NEGATIVE mg/dL   Hgb urine dipstick NEGATIVE NEGATIVE   Bilirubin Urine NEGATIVE NEGATIVE   Ketones, ur NEGATIVE NEGATIVE mg/dL   Protein, ur 30 (A) NEGATIVE mg/dL   Nitrite NEGATIVE NEGATIVE   Leukocytes,Ua NEGATIVE NEGATIVE   RBC / HPF 6-10 0 - 5 RBC/hpf   WBC, UA 0-5 0 - 5 WBC/hpf   Bacteria, UA NONE SEEN NONE SEEN   Mucus PRESENT    Hyaline Casts, UA PRESENT   CBC with Differential     Status: Abnormal   Collection Time: 12/27/18  2:23 AM  Result Value Ref Range   WBC 13.2 (H) 4.0 - 10.5 K/uL   RBC 3.71 (L) 4.22 - 5.81 MIL/uL   Hemoglobin 11.3 (L) 13.0 - 17.0 g/dL   HCT 36.2 (L) 39.0 - 52.0 %   MCV 97.6 80.0 - 100.0 fL   MCH 30.5 26.0 - 34.0 pg   MCHC 31.2 30.0 - 36.0 g/dL   RDW 13.4 11.5 - 15.5 %   Platelets 206 150 - 400 K/uL   nRBC 0.0 0.0 - 0.2 %   Neutrophils Relative % 80 %   Neutro Abs 10.7 (H) 1.7 - 7.7 K/uL   Lymphocytes Relative 6 %   Lymphs Abs 0.8  0.7 - 4.0 K/uL   Monocytes Relative 12 %   Monocytes Absolute 1.5 (H) 0.1 - 1.0 K/uL   Eosinophils Relative 1 %   Eosinophils Absolute 0.1 0.0 - 0.5 K/uL   Basophils Relative 0 %   Basophils Absolute 0.0 0.0 - 0.1 K/uL   Immature Granulocytes 1 %   Abs Immature Granulocytes 0.07 0.00 - 0.07 K/uL  Basic metabolic panel     Status: Abnormal   Collection Time: 12/27/18  2:23 AM  Result Value Ref Range   Sodium 135 135 - 145 mmol/L   Potassium 4.5 3.5 - 5.1 mmol/L   Chloride 99 98 - 111 mmol/L   CO2 25 22 - 32 mmol/L   Glucose, Bld 190 (H) 70 - 99 mg/dL   BUN 28 (H) 8 - 23 mg/dL   Creatinine, Ser 1.50 (H) 0.61 - 1.24 mg/dL   Calcium 8.7 (L) 8.9 - 10.3 mg/dL   GFR calc non Af Amer 43 (L) >60 mL/min   GFR calc Af Amer 50 (L) >60 mL/min   Anion gap 11 5 - 15  POC SARS Coronavirus 2 Ag-ED - Nasal Swab (BD Veritor Kit)     Status: None   Collection Time: 12/27/18  2:48 AM  Result Value Ref Range   SARS Coronavirus 2 Ag NEGATIVE NEGATIVE   Imaging Studies: Ct Head Wo Contrast  Result Date: 12/27/2018 CLINICAL DATA:  Fall, weakness EXAM: CT HEAD WITHOUT CONTRAST TECHNIQUE: Contiguous axial images were obtained from the base of the skull through the vertex without intravenous contrast. COMPARISON:  12/09/2012 FINDINGS: Brain: Old left temporoparietal infarct. Old bilateral basal ganglia lacunar infarcts. There is atrophy and chronic small vessel disease changes. Vascular: No hyperdense vessel or unexpected calcification. Skull: No acute calvarial abnormality. Sinuses/Orbits: Visualized paranasal sinuses and mastoids clear. Orbital soft tissues unremarkable. Other: None IMPRESSION: Multiple old bilateral basal ganglia lacunar infarcts. Old left temporoparietal infarct. Atrophy, chronic microvascular disease. No acute intracranial abnormality. Electronically Signed   By: Rolm Baptise M.D.  On: 12/27/2018 01:48   Dg Chest Port 1 View  Result Date: 12/27/2018 CLINICAL DATA:  Fever, multiple  falls EXAM: PORTABLE CHEST 1 VIEW COMPARISON:  Multiple priors, most recent radiograph 11/17/2017 FINDINGS: Bibasilar interstitial opacities with some more coalescent subpleural opacity in the left lung base. No pneumothorax or effusion. Tortuosity of the aorta is similar to comparison radiographs. The aorta itself is calcified. Remaining cardiomediastinal contours are unchanged from comparison studies. IMPRESSION: 1. Bibasilar interstitial opacities with more coalescent subpleural opacity in the left lung base. Findings could represent atypical pneumonia including viral etiology. 2. Aortic atherosclerosis with tortuosity of the aorta. Electronically Signed   By: Lovena Le M.D.   On: 12/27/2018 03:58    ED COURSE and MDM  Nursing notes, initial and subsequent vitals signs, including pulse oximetry, reviewed and interpreted by myself.  Vitals:   12/27/18 0056 12/27/18 0100 12/27/18 0230  BP: (!) 113/57 (!) 109/57 (!) 100/51  Pulse: 76 70 (!) 59  Resp: 16 (!) 23 19  Temp: 99.9 F (37.7 C)    TempSrc: Oral    SpO2: 100% 99% 97%   Medications  doxycycline (VIBRA-TABS) tablet 100 mg (has no administration in time range)    3:34 AM Patient CT shows old CVAs but nothing acute.  Although he had a low-grade fever on arrival his SARS test is negative and his urinalysis is unremarkable.  We will place on doxycycline for possible atypical pneumonia although source cannot be completely ruled out at this point and confirmatory test is pending.  I do not see an indication for admission at this time.  PROCEDURES  Procedures   ED DIAGNOSES     ICD-10-CM   1. History of multiple cerebrovascular accidents (CVAs)  Z86.73   2. Frequent falls  R29.6        Shanon Rosser, MD 12/27/18 [phone]

## 2018-12-27 NOTE — ED Notes (Signed)
Called facility spoke with receiving nurse Langley Gauss and informed her of Pt return with PTAR as well as antibiotic prescription pt will be sent back home with.

## 2018-12-27 NOTE — ED Notes (Signed)
Pt attempted to provide urine specimen, but unable to at this time. Pt agreeable to in and out cath at this time.

## 2018-12-28 DIAGNOSIS — F0390 Unspecified dementia without behavioral disturbance: Secondary | ICD-10-CM | POA: Diagnosis not present

## 2018-12-28 DIAGNOSIS — U071 COVID-19: Secondary | ICD-10-CM | POA: Diagnosis not present

## 2018-12-28 DIAGNOSIS — M6281 Muscle weakness (generalized): Secondary | ICD-10-CM | POA: Diagnosis not present

## 2018-12-28 DIAGNOSIS — R296 Repeated falls: Secondary | ICD-10-CM | POA: Diagnosis not present

## 2018-12-28 DIAGNOSIS — J189 Pneumonia, unspecified organism: Secondary | ICD-10-CM | POA: Diagnosis not present

## 2018-12-28 LAB — URINE CULTURE: Culture: <10000 — AB

## 2018-12-28 LAB — NOVEL CORONAVIRUS, NAA (HOSP ORDER, SEND-OUT TO REF LAB; TAT 18-24 HRS): SARS-CoV-2, NAA: NOT DETECTED

## 2019-01-06 DIAGNOSIS — I69351 Hemiplegia and hemiparesis following cerebral infarction affecting right dominant side: Secondary | ICD-10-CM | POA: Diagnosis not present

## 2019-01-06 DIAGNOSIS — F0280 Dementia in other diseases classified elsewhere without behavioral disturbance: Secondary | ICD-10-CM | POA: Diagnosis not present

## 2019-01-06 DIAGNOSIS — E1122 Type 2 diabetes mellitus with diabetic chronic kidney disease: Secondary | ICD-10-CM | POA: Diagnosis not present

## 2019-01-06 DIAGNOSIS — Z794 Long term (current) use of insulin: Secondary | ICD-10-CM | POA: Diagnosis not present

## 2019-01-06 DIAGNOSIS — G309 Alzheimer's disease, unspecified: Secondary | ICD-10-CM | POA: Diagnosis not present

## 2019-01-06 DIAGNOSIS — M1611 Unilateral primary osteoarthritis, right hip: Secondary | ICD-10-CM | POA: Diagnosis not present

## 2019-01-17 DIAGNOSIS — M1611 Unilateral primary osteoarthritis, right hip: Secondary | ICD-10-CM | POA: Diagnosis not present

## 2019-01-17 DIAGNOSIS — E1122 Type 2 diabetes mellitus with diabetic chronic kidney disease: Secondary | ICD-10-CM | POA: Diagnosis not present

## 2019-01-17 DIAGNOSIS — F329 Major depressive disorder, single episode, unspecified: Secondary | ICD-10-CM | POA: Diagnosis not present

## 2019-01-17 DIAGNOSIS — Z794 Long term (current) use of insulin: Secondary | ICD-10-CM | POA: Diagnosis not present

## 2019-01-17 DIAGNOSIS — I69351 Hemiplegia and hemiparesis following cerebral infarction affecting right dominant side: Secondary | ICD-10-CM | POA: Diagnosis not present

## 2019-01-17 DIAGNOSIS — F0280 Dementia in other diseases classified elsewhere without behavioral disturbance: Secondary | ICD-10-CM | POA: Diagnosis not present

## 2019-01-17 DIAGNOSIS — I129 Hypertensive chronic kidney disease with stage 1 through stage 4 chronic kidney disease, or unspecified chronic kidney disease: Secondary | ICD-10-CM | POA: Diagnosis not present

## 2019-01-17 DIAGNOSIS — G309 Alzheimer's disease, unspecified: Secondary | ICD-10-CM | POA: Diagnosis not present

## 2019-01-20 DIAGNOSIS — G309 Alzheimer's disease, unspecified: Secondary | ICD-10-CM | POA: Diagnosis not present

## 2019-01-20 DIAGNOSIS — Z794 Long term (current) use of insulin: Secondary | ICD-10-CM | POA: Diagnosis not present

## 2019-01-20 DIAGNOSIS — E1122 Type 2 diabetes mellitus with diabetic chronic kidney disease: Secondary | ICD-10-CM | POA: Diagnosis not present

## 2019-01-20 DIAGNOSIS — M1611 Unilateral primary osteoarthritis, right hip: Secondary | ICD-10-CM | POA: Diagnosis not present

## 2019-01-20 DIAGNOSIS — F0280 Dementia in other diseases classified elsewhere without behavioral disturbance: Secondary | ICD-10-CM | POA: Diagnosis not present

## 2019-01-20 DIAGNOSIS — I69351 Hemiplegia and hemiparesis following cerebral infarction affecting right dominant side: Secondary | ICD-10-CM | POA: Diagnosis not present

## 2019-01-21 ENCOUNTER — Emergency Department (HOSPITAL_COMMUNITY): Payer: Medicare Other

## 2019-01-21 ENCOUNTER — Other Ambulatory Visit: Payer: Self-pay

## 2019-01-21 ENCOUNTER — Inpatient Hospital Stay (HOSPITAL_COMMUNITY)
Admission: EM | Admit: 2019-01-21 | Discharge: 2019-01-27 | DRG: 281 | Disposition: A | Discharge status: Nursing Home (Includes ICF) | Payer: Medicare Other | Attending: Internal Medicine | Admitting: Internal Medicine

## 2019-01-21 ENCOUNTER — Encounter (HOSPITAL_COMMUNITY): Payer: Self-pay

## 2019-01-21 DIAGNOSIS — E1151 Type 2 diabetes mellitus with diabetic peripheral angiopathy without gangrene: Secondary | ICD-10-CM | POA: Diagnosis present

## 2019-01-21 DIAGNOSIS — Z7401 Bed confinement status: Secondary | ICD-10-CM | POA: Diagnosis not present

## 2019-01-21 DIAGNOSIS — I21A1 Myocardial infarction type 2: Secondary | ICD-10-CM | POA: Diagnosis present

## 2019-01-21 DIAGNOSIS — R079 Chest pain, unspecified: Secondary | ICD-10-CM | POA: Diagnosis not present

## 2019-01-21 DIAGNOSIS — I129 Hypertensive chronic kidney disease with stage 1 through stage 4 chronic kidney disease, or unspecified chronic kidney disease: Secondary | ICD-10-CM | POA: Diagnosis present

## 2019-01-21 DIAGNOSIS — I63512 Cerebral infarction due to unspecified occlusion or stenosis of left middle cerebral artery: Secondary | ICD-10-CM | POA: Diagnosis not present

## 2019-01-21 DIAGNOSIS — R262 Difficulty in walking, not elsewhere classified: Secondary | ICD-10-CM | POA: Diagnosis not present

## 2019-01-21 DIAGNOSIS — E1122 Type 2 diabetes mellitus with diabetic chronic kidney disease: Secondary | ICD-10-CM | POA: Diagnosis present

## 2019-01-21 DIAGNOSIS — Z7902 Long term (current) use of antithrombotics/antiplatelets: Secondary | ICD-10-CM | POA: Diagnosis not present

## 2019-01-21 DIAGNOSIS — Z79899 Other long term (current) drug therapy: Secondary | ICD-10-CM | POA: Diagnosis not present

## 2019-01-21 DIAGNOSIS — R001 Bradycardia, unspecified: Secondary | ICD-10-CM | POA: Diagnosis not present

## 2019-01-21 DIAGNOSIS — I69322 Dysarthria following cerebral infarction: Secondary | ICD-10-CM | POA: Diagnosis not present

## 2019-01-21 DIAGNOSIS — M6281 Muscle weakness (generalized): Secondary | ICD-10-CM | POA: Diagnosis not present

## 2019-01-21 DIAGNOSIS — I69351 Hemiplegia and hemiparesis following cerebral infarction affecting right dominant side: Secondary | ICD-10-CM

## 2019-01-21 DIAGNOSIS — G309 Alzheimer's disease, unspecified: Secondary | ICD-10-CM | POA: Diagnosis not present

## 2019-01-21 DIAGNOSIS — I69314 Frontal lobe and executive function deficit following cerebral infarction: Secondary | ICD-10-CM | POA: Diagnosis not present

## 2019-01-21 DIAGNOSIS — Z794 Long term (current) use of insulin: Secondary | ICD-10-CM | POA: Diagnosis not present

## 2019-01-21 DIAGNOSIS — F329 Major depressive disorder, single episode, unspecified: Secondary | ICD-10-CM | POA: Diagnosis present

## 2019-01-21 DIAGNOSIS — Z7982 Long term (current) use of aspirin: Secondary | ICD-10-CM

## 2019-01-21 DIAGNOSIS — I340 Nonrheumatic mitral (valve) insufficiency: Secondary | ICD-10-CM | POA: Diagnosis not present

## 2019-01-21 DIAGNOSIS — Z66 Do not resuscitate: Secondary | ICD-10-CM | POA: Diagnosis present

## 2019-01-21 DIAGNOSIS — E785 Hyperlipidemia, unspecified: Secondary | ICD-10-CM | POA: Diagnosis present

## 2019-01-21 DIAGNOSIS — R0789 Other chest pain: Secondary | ICD-10-CM | POA: Diagnosis not present

## 2019-01-21 DIAGNOSIS — I200 Unstable angina: Secondary | ICD-10-CM | POA: Diagnosis not present

## 2019-01-21 DIAGNOSIS — N183 Chronic kidney disease, stage 3 unspecified: Secondary | ICD-10-CM

## 2019-01-21 DIAGNOSIS — R1312 Dysphagia, oropharyngeal phase: Secondary | ICD-10-CM | POA: Diagnosis not present

## 2019-01-21 DIAGNOSIS — I10 Essential (primary) hypertension: Secondary | ICD-10-CM | POA: Diagnosis not present

## 2019-01-21 DIAGNOSIS — U071 COVID-19: Secondary | ICD-10-CM | POA: Diagnosis not present

## 2019-01-21 DIAGNOSIS — R4182 Altered mental status, unspecified: Secondary | ICD-10-CM | POA: Diagnosis not present

## 2019-01-21 DIAGNOSIS — I160 Hypertensive urgency: Secondary | ICD-10-CM | POA: Diagnosis present

## 2019-01-21 DIAGNOSIS — N400 Enlarged prostate without lower urinary tract symptoms: Secondary | ICD-10-CM | POA: Diagnosis not present

## 2019-01-21 DIAGNOSIS — Z8719 Personal history of other diseases of the digestive system: Secondary | ICD-10-CM

## 2019-01-21 DIAGNOSIS — R279 Unspecified lack of coordination: Secondary | ICD-10-CM | POA: Diagnosis not present

## 2019-01-21 DIAGNOSIS — R7989 Other specified abnormal findings of blood chemistry: Secondary | ICD-10-CM | POA: Diagnosis not present

## 2019-01-21 DIAGNOSIS — I959 Hypotension, unspecified: Secondary | ICD-10-CM | POA: Diagnosis not present

## 2019-01-21 DIAGNOSIS — G8929 Other chronic pain: Secondary | ICD-10-CM | POA: Diagnosis present

## 2019-01-21 DIAGNOSIS — F0151 Vascular dementia with behavioral disturbance: Secondary | ICD-10-CM | POA: Diagnosis not present

## 2019-01-21 DIAGNOSIS — I69320 Aphasia following cerebral infarction: Secondary | ICD-10-CM

## 2019-01-21 DIAGNOSIS — E119 Type 2 diabetes mellitus without complications: Secondary | ICD-10-CM

## 2019-01-21 DIAGNOSIS — M1990 Unspecified osteoarthritis, unspecified site: Secondary | ICD-10-CM | POA: Diagnosis not present

## 2019-01-21 DIAGNOSIS — Z8616 Personal history of COVID-19: Secondary | ICD-10-CM | POA: Diagnosis present

## 2019-01-21 DIAGNOSIS — F0280 Dementia in other diseases classified elsewhere without behavioral disturbance: Secondary | ICD-10-CM | POA: Diagnosis not present

## 2019-01-21 DIAGNOSIS — M1611 Unilateral primary osteoarthritis, right hip: Secondary | ICD-10-CM | POA: Diagnosis not present

## 2019-01-21 DIAGNOSIS — Z23 Encounter for immunization: Secondary | ICD-10-CM | POA: Diagnosis not present

## 2019-01-21 DIAGNOSIS — N1831 Chronic kidney disease, stage 3a: Secondary | ICD-10-CM | POA: Diagnosis present

## 2019-01-21 DIAGNOSIS — Z833 Family history of diabetes mellitus: Secondary | ICD-10-CM | POA: Diagnosis not present

## 2019-01-21 DIAGNOSIS — I69391 Dysphagia following cerebral infarction: Secondary | ICD-10-CM | POA: Diagnosis not present

## 2019-01-21 DIAGNOSIS — R5381 Other malaise: Secondary | ICD-10-CM | POA: Diagnosis not present

## 2019-01-21 DIAGNOSIS — Z8673 Personal history of transient ischemic attack (TIA), and cerebral infarction without residual deficits: Secondary | ICD-10-CM | POA: Diagnosis not present

## 2019-01-21 DIAGNOSIS — R4702 Dysphasia: Secondary | ICD-10-CM | POA: Diagnosis not present

## 2019-01-21 DIAGNOSIS — Z8619 Personal history of other infectious and parasitic diseases: Secondary | ICD-10-CM

## 2019-01-21 DIAGNOSIS — M2550 Pain in unspecified joint: Secondary | ICD-10-CM | POA: Diagnosis not present

## 2019-01-21 DIAGNOSIS — R0902 Hypoxemia: Secondary | ICD-10-CM | POA: Diagnosis not present

## 2019-01-21 DIAGNOSIS — I358 Other nonrheumatic aortic valve disorders: Secondary | ICD-10-CM | POA: Diagnosis not present

## 2019-01-21 DIAGNOSIS — F0390 Unspecified dementia without behavioral disturbance: Secondary | ICD-10-CM | POA: Diagnosis present

## 2019-01-21 HISTORY — DX: Unspecified dementia, unspecified severity, without behavioral disturbance, psychotic disturbance, mood disturbance, and anxiety: F03.90

## 2019-01-21 LAB — CBC
HCT: 37.1 % — ABNORMAL LOW (ref 39.0–52.0)
Hemoglobin: 11.4 g/dL — ABNORMAL LOW (ref 13.0–17.0)
MCH: 30.3 pg (ref 26.0–34.0)
MCHC: 30.7 g/dL (ref 30.0–36.0)
MCV: 98.7 fL (ref 80.0–100.0)
Platelets: 222 10*3/uL (ref 150–400)
RBC: 3.76 MIL/uL — ABNORMAL LOW (ref 4.22–5.81)
RDW: 14.5 % (ref 11.5–15.5)
WBC: 5.9 10*3/uL (ref 4.0–10.5)
nRBC: 0 % (ref 0.0–0.2)

## 2019-01-21 LAB — BASIC METABOLIC PANEL
Anion gap: 12 (ref 5–15)
BUN: 22 mg/dL (ref 8–23)
CO2: 22 mmol/L (ref 22–32)
Calcium: 8.9 mg/dL (ref 8.9–10.3)
Chloride: 99 mmol/L (ref 98–111)
Creatinine, Ser: 1.54 mg/dL — ABNORMAL HIGH (ref 0.61–1.24)
GFR calc Af Amer: 48 mL/min — ABNORMAL LOW (ref >60)
GFR calc non Af Amer: 42 mL/min — ABNORMAL LOW (ref >60)
Glucose, Bld: 451 mg/dL — ABNORMAL HIGH (ref 70–99)
Potassium: 4.9 mmol/L (ref 3.5–5.1)
Sodium: 133 mmol/L — ABNORMAL LOW (ref 135–145)

## 2019-01-21 LAB — TROPONIN I (HIGH SENSITIVITY)
Troponin I (High Sensitivity): 22 ng/L — ABNORMAL HIGH (ref <18)
Troponin I (High Sensitivity): 69 ng/L — ABNORMAL HIGH (ref <18)

## 2019-01-21 LAB — CBG MONITORING, ED: Glucose-Capillary: 336 mg/dL — ABNORMAL HIGH (ref 70–99)

## 2019-01-21 MED ORDER — ACETAMINOPHEN 325 MG PO TABS: 650.0000 mg | ORAL_TABLET | ORAL | Status: DC | PRN

## 2019-01-21 MED ORDER — RAMIPRIL 5 MG PO CAPS: 5.0000 mg | ORAL_CAPSULE | Freq: Every day | ORAL | Status: DC

## 2019-01-21 MED ORDER — LISINOPRIL 10 MG PO TABS
10.0000 mg | ORAL_TABLET | Freq: Every day | ORAL | Status: DC
  Administered 2019-01-22 – 2019-01-26 (×5): 10 mg via ORAL
  Filled 2019-01-21 (×5): qty 1

## 2019-01-21 MED ORDER — GABAPENTIN 300 MG PO CAPS
300.0000 mg | ORAL_CAPSULE | Freq: Three times a day (TID) | ORAL | Status: DC
  Administered 2019-01-22 – 2019-01-26 (×15): 300 mg via ORAL
  Filled 2019-01-21 (×15): qty 1

## 2019-01-21 MED ORDER — LORAZEPAM 0.5 MG PO TABS
0.5000 mg | ORAL_TABLET | Freq: Three times a day (TID) | ORAL | Status: DC | PRN
  Administered 2019-01-22 – 2019-01-25 (×4): 0.5 mg via ORAL
  Filled 2019-01-21 (×4): qty 1

## 2019-01-21 MED ORDER — MELATONIN 3 MG PO TABS
1.0000 | ORAL_TABLET | Freq: Every day | ORAL | Status: DC | PRN
  Administered 2019-01-22 – 2019-01-25 (×4): 3 mg via ORAL
  Filled 2019-01-21 (×6): qty 1

## 2019-01-21 MED ORDER — NITROGLYCERIN 0.4 MG SL SUBL: 0.4000 mg | SUBLINGUAL_TABLET | SUBLINGUAL | Status: DC | PRN

## 2019-01-21 MED ORDER — INSULIN GLARGINE 100 UNIT/ML ~~LOC~~ SOLN
10.0000 [IU] | Freq: Two times a day (BID) | SUBCUTANEOUS | Status: DC
  Administered 2019-01-21 – 2019-01-23 (×5): 10 [IU] via SUBCUTANEOUS
  Filled 2019-01-21 (×7): qty 0.1

## 2019-01-21 MED ORDER — INSULIN ASPART 100 UNIT/ML ~~LOC~~ SOLN
0.0000 [IU] | SUBCUTANEOUS | Status: DC
  Administered 2019-01-22: 08:00:00 2 [IU] via SUBCUTANEOUS
  Administered 2019-01-22: 7 [IU] via SUBCUTANEOUS
  Administered 2019-01-22 – 2019-01-23 (×4): 2 [IU] via SUBCUTANEOUS
  Administered 2019-01-23 (×3): 1 [IU] via SUBCUTANEOUS
  Administered 2019-01-24: 20:00:00 2 [IU] via SUBCUTANEOUS
  Administered 2019-01-24: 17:00:00 1 [IU] via SUBCUTANEOUS
  Administered 2019-01-24: 13:00:00 2 [IU] via SUBCUTANEOUS
  Administered 2019-01-25: 04:00:00 1 [IU] via SUBCUTANEOUS
  Administered 2019-01-25: 3 [IU] via SUBCUTANEOUS
  Administered 2019-01-25: 14:00:00 1 [IU] via SUBCUTANEOUS
  Administered 2019-01-25: 3 [IU] via SUBCUTANEOUS
  Administered 2019-01-26: 09:00:00 1 [IU] via SUBCUTANEOUS
  Administered 2019-01-26: 13:00:00 2 [IU] via SUBCUTANEOUS
  Administered 2019-01-26: 1 [IU] via SUBCUTANEOUS

## 2019-01-21 MED ORDER — ADULT MULTIVITAMIN W/MINERALS CH
1.0000 | ORAL_TABLET | Freq: Every day | ORAL | Status: DC
  Administered 2019-01-22 – 2019-01-26 (×5): 1 via ORAL
  Filled 2019-01-21 (×5): qty 1

## 2019-01-21 MED ORDER — DONEPEZIL HCL 5 MG PO TABS
5.0000 mg | ORAL_TABLET | Freq: Every day | ORAL | Status: DC
  Administered 2019-01-21 – 2019-01-26 (×6): 5 mg via ORAL
  Filled 2019-01-21 (×6): qty 1

## 2019-01-21 MED ORDER — ONDANSETRON HCL 4 MG/2ML IJ SOLN: 4.0000 mg | Freq: Four times a day (QID) | INTRAMUSCULAR | Status: DC | PRN

## 2019-01-21 MED ORDER — CLOPIDOGREL BISULFATE 75 MG PO TABS
75.0000 mg | ORAL_TABLET | Freq: Every day | ORAL | Status: DC
  Administered 2019-01-22 – 2019-01-26 (×5): 75 mg via ORAL
  Filled 2019-01-21 (×5): qty 1

## 2019-01-21 MED ORDER — ASPIRIN 300 MG RE SUPP: 300.0000 mg | RECTAL | Status: AC

## 2019-01-21 MED ORDER — CITALOPRAM HYDROBROMIDE 10 MG PO TABS
10.0000 mg | ORAL_TABLET | Freq: Every day | ORAL | Status: DC
  Administered 2019-01-22 – 2019-01-26 (×5): 10 mg via ORAL
  Filled 2019-01-21 (×5): qty 1

## 2019-01-21 MED ORDER — HYDROCODONE-ACETAMINOPHEN 5-325 MG PO TABS
1.0000 | ORAL_TABLET | Freq: Four times a day (QID) | ORAL | Status: DC | PRN
  Administered 2019-01-22 (×2): 1 via ORAL
  Filled 2019-01-21 (×2): qty 1

## 2019-01-21 MED ORDER — HEPARIN (PORCINE) 25000 UT/250ML-% IV SOLN
1050.0000 [IU]/h | INTRAVENOUS | Status: DC
  Administered 2019-01-22 – 2019-01-23 (×3): 950 [IU]/h via INTRAVENOUS
  Administered 2019-01-24: 07:00:00 1050 [IU]/h via INTRAVENOUS
  Filled 2019-01-21 (×4): qty 250

## 2019-01-21 MED ORDER — SIMVASTATIN 20 MG PO TABS
40.0000 mg | ORAL_TABLET | Freq: Every day | ORAL | Status: DC
  Administered 2019-01-22: 40 mg via ORAL
  Filled 2019-01-21: qty 2

## 2019-01-21 MED ORDER — ASPIRIN EC 81 MG PO TBEC
81.0000 mg | DELAYED_RELEASE_TABLET | Freq: Every day | ORAL | Status: DC
  Administered 2019-01-22 – 2019-01-26 (×5): 81 mg via ORAL
  Filled 2019-01-21 (×5): qty 1

## 2019-01-21 MED ORDER — ASPIRIN 81 MG PO CHEW: 324.0000 mg | CHEWABLE_TABLET | ORAL | Status: AC

## 2019-01-21 MED ORDER — HEPARIN BOLUS VIA INFUSION
4000.0000 [IU] | Freq: Once | INTRAVENOUS | Status: AC
  Administered 2019-01-22: 4000 [IU] via INTRAVENOUS
  Filled 2019-01-21: qty 4000

## 2019-01-21 MED ORDER — ASPIRIN EC 81 MG PO TBEC: 81.0000 mg | DELAYED_RELEASE_TABLET | Freq: Every day | ORAL | Status: DC

## 2019-01-21 NOTE — ED Notes (Signed)
Patient son in law calling asking for an update on patient  Casey Collins [id number]
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
## 2019-01-21 NOTE — Progress Notes (Signed)
ANTICOAGULATION CONSULT NOTE - Initial Consult  Pharmacy Consult for heparin Indication: chest pain/ACS  No Known Allergies  Patient Measurements: Height: 5\' 10"  (177.8 cm) Weight: 175 lb (79.4 kg) IBW/kg (Calculated) : 73 Heparin Dosing Weight: 79.4 kg  Vital Signs: Temp: 97.7 F (36.5 C) (12/24 1930) Temp Source: Oral (12/24 1930) BP: 156/68 (12/24 2200) Pulse Rate: 81 (12/24 2200)  Labs: Recent Labs    01/21/19 1933 01/21/19 2133  HGB 11.4*  --   HCT 37.1*  --   PLT 222  --   CREATININE 1.54*  --   TROPONINIHS 22* 69*    Estimated Creatinine Clearance: 38.8 mL/min (A) (by C-G formula based on SCr of 1.54 mg/dL (H)).   Medical History: Past Medical History:  Diagnosis Date  . Arthritis   . Chronic right hip pain   . Colon polyp   . Dementia ([place])   . Depression   . Diverticulosis   . Hyperlipemia   . Hypertension   . Pneumonia X 2  . Stroke [facility]) ~ 2011-2016 X 4   "right side weak since; difficulty walking, speaking, read, eat" (06/06/2015)  . Type II diabetes mellitus (HCC)     Medications:  See medication history  Assessment: 83 yo man with CP to start heparin.  He was not on anticoagulation PTA.  Hg 11.4, PTLC 222 Goal of Therapy:  Heparin level 0.3-0.7 units/ml Monitor platelets by anticoagulation protocol: Yes   Plan:  Heparin bolus 4000 units and drip at 950 units/hr Check heparin level in 8 hours Daily HL and CBC while on heparin Monitor for bleeding complications  Thanks for allowing pharmacy to be a part of this patient's care.  Excell Seltzer, PharmD Clinical Pharmacist 01/21/2019,10:49 PM

## 2019-01-21 NOTE — ED Notes (Signed)
Cleaned pt's perineal area and applied clean brief. 

## 2019-01-21 NOTE — ED Triage Notes (Signed)
Pt arrived via [facility] from [facility] dementia unit on [address] for c/o midsternal, non-radiating chest pain. Per EMS initial bp 195/150. ASA 324mg  and nitro 0.4mg  given and helped relieve chest pain and brought pt's bp down to 155/70. Pt A&)x1 to self only. Pt is NSR w/BBB w/ST elevation on monitor. VS WNL.

## 2019-01-21 NOTE — H&P (Signed)
History and Physical    Casey Collins [id number] DOB: [birth year] DOA: 01/21/2019  PCP: Burnard Bunting, MD  Patient coming from: SNF  I have personally briefly reviewed patient's old medical records in [place]  Chief Complaint: chest pain  HPI: Casey Collins is a 83 y.o. male with medical history significant of dementia, prior stroke with chronic R sided weakness and aphasia since then, DM2.  Patient had and has recovered from [place] in early November.  Patient presents to ED with c/o CP.  Just prior to arrival was laying down developed dull pain in the middle of his chest.  Pain was constant and only improved after nitroglycerin and aspirin were given by EMS.  He denies history of similar symptoms in the past.  States that he is currently chest pain-free.  Denies any prior or current shortness of breath, cough, fever, leg swelling, nausea, diaphoresis or radiation of pain. He denies history of MI in the past.   ED Course: EKG shows old inferior wall infarct, unchanged from priors.  CXR shows bibasilar actelectasis.  First trop was 56, second was 78.  Creat 1.5.  Looks to be about baseline.  Review of Systems: As per HPI, otherwise all review of systems negative.  Past Medical History:  Diagnosis Date  . Arthritis   . Chronic right hip pain   . Colon polyp   . Dementia ([place])   . Depression   . Diverticulosis   . Hyperlipemia   . Hypertension   . Pneumonia X 2  . Stroke [facility]) ~ 2011-2016 X 4   "right side weak since; difficulty walking, speaking, read, eat" (06/06/2015)  . Type II diabetes mellitus ([place])     Past Surgical History:  Procedure Laterality Date  . APPENDECTOMY  06/06/2015  . ARTERY EXPLORATION Left 10/12/2017   Procedure: LEFT UPPER ARM EXPLORATION AND REPAIR OF BRACHIAL ARTEY USING RIGHT LEG SAPHENOUS VEIN;  Surgeon: Rosetta Posner, MD;  Location: [place];  Service: Vascular;  Laterality: Left;  . CATARACT EXTRACTION W/ INTRAOCULAR  LENS  IMPLANT, BILATERAL Bilateral 2016  . INGUINAL HERNIA REPAIR Bilateral   . LAPAROSCOPIC APPENDECTOMY N/A 06/06/2015   Procedure: APPENDECTOMY LAPAROSCOPIC;  Surgeon: Ralene Ok, MD;  Location: [place];  Service: General;  Laterality: N/A;  . TONSILLECTOMY    . VEIN HARVEST Right 10/12/2017   Procedure: SAPHENOUS VEIN HARVEST FROM RIGHT UPPER LEG;  Surgeon: Rosetta Posner, MD;  Location: [facility] OR;  Service: Vascular;  Laterality: Right;     reports that he has never smoked. He has never used smokeless tobacco. He reports current drug use. He reports that he does not drink alcohol.  No Known Allergies  Family History  Problem Relation Age of Onset  . Prostate cancer Brother   . Diabetes Daughter   . Colon cancer Neg Hx      Prior to Admission medications   Medication Sig Start Date End Date Taking? Authorizing Provider  Ascorbic Acid (VITAMIN C) 1000 MG tablet Take 1 tablet (1,000 mg total) by mouth daily. 11/07/17  Yes Angiulli, Lavon Paganini, PA-C  aspirin EC 81 MG tablet Take 81 mg by mouth daily.   Yes [provider]  citalopram (CELEXA) 10 MG tablet Take 1 tablet (10 mg total) by mouth daily. 11/07/17  Yes Angiulli, Lavon Paganini, PA-C  clopidogrel (PLAVIX) 75 MG tablet Take 1 tablet (75 mg total) by mouth daily. 11/07/17  Yes Angiulli, Lavon Paganini, PA-C  donepezil (ARICEPT) 5 MG tablet  Take 5 mg by mouth at bedtime.   Yes [provider]  gabapentin (NEURONTIN) 300 MG capsule Take 300 mg by mouth 3 (three) times daily. 11/12/18  Yes [provider]  HYDROcodone-acetaminophen (NORCO) 5-325 MG tablet Take 1 tablet by mouth every 6 (six) hours as needed for moderate pain. 10/26/18  Yes Harris, Abigail, PA-C  Insulin Glargine (BASAGLAR KWIKPEN) 100 UNIT/ML SOPN Inject 0.1 mLs (10 Units total) into the skin 2 (two) times daily. 11/07/17  Yes Angiulli, Lavon Paganini, PA-C  insulin regular (NOVOLIN R) 100 units/mL injection Inject 5 Units into the skin every morning.   Yes  [provider]  lisinopril (ZESTRIL) 10 MG tablet TAKE 1 TABLET BY MOUTH DAILY Patient taking differently: Take 10 mg by mouth daily.  10/12/18  Yes Miquel Dunn, NP  LORazepam (ATIVAN) 0.5 MG tablet Take 0.5 mg by mouth 3 (three) times daily as needed for anxiety. 12/04/18  Yes [provider]  Melatonin 3 MG TABS Take 1 tablet by mouth daily as needed (For sleep).   Yes [provider]  Multiple Vitamin (MULTIVITAMIN) tablet Take 1 tablet by mouth daily.   Yes [provider]  ondansetron (ZOFRAN ODT) 4 MG disintegrating tablet 4mg  ODT q4 hours prn nausea/vomit Patient taking differently: Take 4 mg by mouth every 4 (four) hours as needed for nausea.  12/01/18  Yes Milton Ferguson, MD  ramipril (ALTACE) 5 MG capsule Take 1 capsule (5 mg total) by mouth daily. 11/07/17  Yes Angiulli, Lavon Paganini, PA-C  simvastatin (ZOCOR) 40 MG tablet Take 1 tablet (40 mg total) by mouth at bedtime. Patient taking differently: Take 40 mg by mouth every morning.  11/07/17  Yes Angiulli, Lavon Paganini, PA-C  tizanidine (ZANAFLEX) 2 MG capsule Take 2 mg by mouth 3 (three) times daily as needed for muscle spasms.   Yes [provider]    Physical Exam: Vitals:   01/21/19 2030 01/21/19 2100 01/21/19 2130 01/21/19 2200  BP: 133/77 132/65 (!) 162/86 (!) 156/68  Pulse: 80 75 84 81  Resp: 16 14 13 15   Temp:      TempSrc:      SpO2: 97% 96% 98% 98%  Weight:      Height:        Constitutional: NAD, calm, comfortable Eyes: PERRL, lids and conjunctivae normal ENMT: Mucous membranes are moist. Posterior pharynx clear of any exudate or lesions.Normal dentition.  Neck: normal, supple, no masses, no thyromegaly Respiratory: clear to auscultation bilaterally, no wheezing, no crackles. Normal respiratory effort. No accessory muscle use.  Cardiovascular: Regular rate and rhythm, no murmurs / rubs / gallops. No extremity edema. 2+ pedal pulses. No carotid bruits.  Abdomen: no  tenderness, no masses palpated. No hepatosplenomegaly. Bowel sounds positive.  Musculoskeletal: no clubbing / cyanosis. No joint deformity upper and lower extremities. Good ROM, no contractures. Normal muscle tone.  Skin: no rashes, lesions, ulcers. No induration Neurologic: R facial droop noted. Psychiatric: Normal judgment and insight. Alert and oriented x 3. Normal mood.    Labs on Admission: I have personally reviewed following labs and imaging studies  CBC: Recent Labs  Lab 01/21/19 1933  WBC 5.9  HGB 11.4*  HCT 37.1*  MCV 98.7  PLT [id number]   Basic Metabolic Panel: Recent Labs  Lab 01/21/19 1933  NA 133*  K 4.9  CL 99  CO2 22  GLUCOSE 451*  BUN 22  CREATININE 1.54*  CALCIUM 8.9   GFR: Estimated Creatinine Clearance: 38.8 mL/min (A) (  by C-G formula based on SCr of 1.54 mg/dL (H)). Liver Function Tests: No results for input(s): AST, ALT, ALKPHOS, BILITOT, PROT, ALBUMIN in the last 168 hours. No results for input(s): LIPASE, AMYLASE in the last 168 hours. No results for input(s): AMMONIA in the last 168 hours. Coagulation Profile: No results for input(s): INR, PROTIME in the last 168 hours. Cardiac Enzymes: No results for input(s): CKTOTAL, CKMB, CKMBINDEX, TROPONINI in the last 168 hours. BNP (last 3 results) No results for input(s): PROBNP in the last 8760 hours. HbA1C: No results for input(s): HGBA1C in the last 72 hours. CBG: No results for input(s): GLUCAP in the last 168 hours. Lipid Profile: No results for input(s): CHOL, HDL, LDLCALC, TRIG, CHOLHDL, LDLDIRECT in the last 72 hours. Thyroid Function Tests: No results for input(s): TSH, T4TOTAL, FREET4, T3FREE, THYROIDAB in the last 72 hours. Anemia Panel: No results for input(s): VITAMINB12, FOLATE, FERRITIN, TIBC, IRON, RETICCTPCT in the last 72 hours. Urine analysis:    Component Value Date/Time   COLORURINE YELLOW 12/27/2018 0205   APPEARANCEUR CLEAR 12/27/2018 0205   LABSPEC 1.016 12/27/2018 0205    PHURINE 6.0 12/27/2018 0205   GLUCOSEU 50 (A) 12/27/2018 0205   HGBUR NEGATIVE 12/27/2018 0205   BILIRUBINUR NEGATIVE 12/27/2018 0205   KETONESUR NEGATIVE 12/27/2018 0205   PROTEINUR 30 (A) 12/27/2018 0205   UROBILINOGEN 1.0 12/09/2012 1651   NITRITE NEGATIVE 12/27/2018 0205   LEUKOCYTESUR NEGATIVE 12/27/2018 0205    Radiological Exams on Admission: DG Chest 2 View  Result Date: 01/21/2019 CLINICAL DATA:  Chest pain EXAM: CHEST - 2 VIEW COMPARISON:  December 27, 2018 FINDINGS: There is mild cardiomegaly. Tortuous ascending aorta. Aortic knob calcifications. Mildly increased interstitial markings seen at the left lung base. No large airspace consolidation or pleural effusion. The visualized skeletal structures are unremarkable. IMPRESSION: Nonspecific mildly increased interstitial markings at the left lung base which could be due to atelectasis and/or early infectious etiology Electronically Signed   By: Prudencio Pair M.D.   On: 01/21/2019 20:05    EKG: Independently reviewed.  Assessment/Plan Principal Problem:   Unstable angina (HCC) Active Problems:   Diabetes mellitus type 2 in nonobese (HCC)   Benign essential HTN   Aphasia as late effect of stroke   History of 2019 novel coronavirus disease (COVID-19)    1. Unstable angina - CP with rising troponin 1. ACS pathway 2. Tele monitor 3. EDP calling cardiology 4. Heparin gtt 5. Continue ASA/Plavix 6. Continue statin 7. Continue ACEi 8. Defer BB start to cardiology. 9. CP free currently after NGT by EMS 2. DM2 - 1. CBG 451 in ED 2. Will continue him on lantus 10u BID 3. And sensitive SSI Q4H 3. HTN - 1. Continue home lisinopril 4. Aphasia as late effect of stroke - chronic and stable 5. H/o COVID-19 in early Nov - 1. Had mild-moderately symptomatic COVID diagnosed Nov 3rd. 2. Patient is currently in the 3 month "do not retest" window. 3. ED sent COVID test anyhow, if this comes back positive, result is likely a false  positive per our protocol and CDC recs.  DVT prophylaxis: Heparin gtt Code Status: Full Family Communication: No family in room Disposition Plan: Back to SNF Consults called: EDP calling cards Admission status: Admit to inpatient  Severity of Illness: The appropriate patient status for this patient is INPATIENT. Inpatient status is judged to be reasonable and necessary in order to provide the required intensity of service to ensure the patient's safety. The patient's presenting symptoms,  physical exam findings, and initial radiographic and laboratory data in the context of their chronic comorbidities is felt to place them at high risk for further clinical deterioration. Furthermore, it is not anticipated that the patient will be medically stable for discharge from the hospital within 2 midnights of admission. The following factors support the patient status of inpatient.   IP status due to NSTEMI/ACS/unstable angina: CP with rising troponins.   * I certify that at the point of admission it is my clinical judgment that the patient will require inpatient hospital care spanning beyond 2 midnights from the point of admission due to high intensity of service, high risk for further deterioration and high frequency of surveillance required.*    Mikias Lanz M. DO [facility]  How to contact the [facility] Attending or Consulting provider [place] or covering provider during after hours [place], for this patient?  1. Check the care team in [facility] and look for a) attending/consulting [facility] provider listed and b) the [facility] team listed 2. Log into [email or web address]  Amion Physician Scheduling and messaging for groups and whole hospitals  On call and physician scheduling software for group practices, residents, hospitalists and other medical providers for call, clinic, rotation and shift schedules. On[employer] is a hospital-wide system for scheduling doctors and paging doctors on call. EasyPlot is for  scientific plotting and data analysis.  [email or web address]  and use [place]'s universal password to access. If you do not have the password, please contact the hospital operator.  3. Locate the [facility] provider you are looking for under [facility] and page to a number that you can be directly reached. 4. If you still have difficulty reaching the provider, please page the [facility] (Director on Call) for the Hospitalists listed on amion for assistance.  01/21/2019, 11:04 PM

## 2019-01-22 ENCOUNTER — Encounter (HOSPITAL_COMMUNITY): Payer: Self-pay | Admitting: Internal Medicine

## 2019-01-22 ENCOUNTER — Inpatient Hospital Stay (HOSPITAL_COMMUNITY): Payer: Medicare Other

## 2019-01-22 DIAGNOSIS — I340 Nonrheumatic mitral (valve) insufficiency: Secondary | ICD-10-CM

## 2019-01-22 DIAGNOSIS — Z8673 Personal history of transient ischemic attack (TIA), and cerebral infarction without residual deficits: Secondary | ICD-10-CM

## 2019-01-22 DIAGNOSIS — E785 Hyperlipidemia, unspecified: Secondary | ICD-10-CM

## 2019-01-22 DIAGNOSIS — R0789 Other chest pain: Secondary | ICD-10-CM

## 2019-01-22 LAB — CBC
HCT: 33 % — ABNORMAL LOW (ref 39.0–52.0)
Hemoglobin: 10.5 g/dL — ABNORMAL LOW (ref 13.0–17.0)
MCH: 30.1 pg (ref 26.0–34.0)
MCHC: 31.8 g/dL (ref 30.0–36.0)
MCV: 94.6 fL (ref 80.0–100.0)
Platelets: 200 10*3/uL (ref 150–400)
RBC: 3.49 MIL/uL — ABNORMAL LOW (ref 4.22–5.81)
RDW: 14.3 % (ref 11.5–15.5)
WBC: 9.1 10*3/uL (ref 4.0–10.5)
nRBC: 0 % (ref 0.0–0.2)

## 2019-01-22 LAB — GLUCOSE, CAPILLARY
Glucose-Capillary: 182 mg/dL — ABNORMAL HIGH (ref 70–99)
Glucose-Capillary: 117 mg/dL — ABNORMAL HIGH (ref 70–99)
Glucose-Capillary: 171 mg/dL — ABNORMAL HIGH (ref 70–99)
Glucose-Capillary: 160 mg/dL — ABNORMAL HIGH (ref 70–99)

## 2019-01-22 LAB — ECHOCARDIOGRAM COMPLETE
Height: 70.5 in
Weight: 2917.13 oz

## 2019-01-22 LAB — BASIC METABOLIC PANEL
Anion gap: 9 (ref 5–15)
BUN: 23 mg/dL (ref 8–23)
CO2: 26 mmol/L (ref 22–32)
Calcium: 8.6 mg/dL — ABNORMAL LOW (ref 8.9–10.3)
Chloride: 104 mmol/L (ref 98–111)
Creatinine, Ser: 1.36 mg/dL — ABNORMAL HIGH (ref 0.61–1.24)
GFR calc Af Amer: 56 mL/min — ABNORMAL LOW (ref >60)
GFR calc non Af Amer: 48 mL/min — ABNORMAL LOW (ref >60)
Glucose, Bld: 285 mg/dL — ABNORMAL HIGH (ref 70–99)
Potassium: 4.3 mmol/L (ref 3.5–5.1)
Sodium: 139 mmol/L (ref 135–145)

## 2019-01-22 LAB — HEPARIN LEVEL (UNFRACTIONATED)
Heparin Unfractionated: 0.46 IU/mL (ref 0.30–0.70)
Heparin Unfractionated: <0.1 IU/mL — ABNORMAL LOW (ref 0.30–0.70)
Heparin Unfractionated: <0.1 IU/mL — ABNORMAL LOW (ref 0.30–0.70)

## 2019-01-22 LAB — TROPONIN I (HIGH SENSITIVITY)
Troponin I (High Sensitivity): 156 ng/L (ref <18)
Troponin I (High Sensitivity): 212 ng/L (ref <18)

## 2019-01-22 LAB — HEMOGLOBIN A1C
Hgb A1c MFr Bld: 7.1 % — ABNORMAL HIGH (ref 4.8–5.6)
Mean Plasma Glucose: 157.07 mg/dL

## 2019-01-22 LAB — SARS CORONAVIRUS 2 (TAT 6-24 HRS): SARS Coronavirus 2: POSITIVE — AB

## 2019-01-22 MED ORDER — METOPROLOL TARTRATE 25 MG PO TABS: 25.0000 mg | ORAL_TABLET | Freq: Two times a day (BID) | ORAL | Status: DC

## 2019-01-22 MED ORDER — HALOPERIDOL LACTATE 5 MG/ML IJ SOLN
2.0000 mg | Freq: Once | INTRAMUSCULAR | Status: AC
  Administered 2019-01-22: 22:00:00 2 mg via INTRAMUSCULAR
  Filled 2019-01-22: qty 1

## 2019-01-22 MED ORDER — CARVEDILOL 3.125 MG PO TABS
3.1250 mg | ORAL_TABLET | Freq: Two times a day (BID) | ORAL | Status: DC
  Administered 2019-01-22 – 2019-01-23 (×4): 3.125 mg via ORAL
  Filled 2019-01-22 (×5): qty 1

## 2019-01-22 NOTE — Progress Notes (Signed)
Echocardiogram 2D Echocardiogram has been performed.  Oneal Deputy Jaquana Geiger 01/22/2019, 9:29 AM

## 2019-01-22 NOTE — Progress Notes (Signed)
ANTICOAGULATION CONSULT NOTE - Follow Up Consult  Pharmacy Consult for Heparin Indication: chest pain/ACS  No Known Allergies  Patient Measurements: Height: 5' 10.5" (179.1 cm) Weight: 182 lb 5.1 oz (82.7 kg) IBW/kg (Calculated) : 74.15 Heparin Dosing Weight: 83 kg   Vital Signs: Temp: 97.9 F (36.6 C) (12/25 0300) Temp Source: Oral (12/25 0300) BP: 152/66 (12/25 0759) Pulse Rate: 66 (12/25 0759)  Labs: Recent Labs    01/21/19 1933 01/21/19 2133 01/22/19 0046 01/22/19 0159 01/22/19 0702  HGB 11.4*  --   --  10.5*  --   HCT 37.1*  --   --  33.0*  --   PLT 222  --   --  200  --   HEPARINUNFRC  --   --   --   --  0.46  CREATININE 1.54*  --   --  1.36*  --   TROPONINIHS 22* 69* 156* 212*  --     Estimated Creatinine Clearance: 44.7 mL/min (A) (by C-G formula based on SCr of 1.36 mg/dL (H)).  Assessment: Casey Collins presented with 2 hours of ongoing chest pain 01/21/2019. No PTA anticoagulant. Received heparin 4000 unit bolus and started on heparin gtt 950 units/hour 01/21/2019 for ACS work-up.  Heparin level this morning therapeutic at 0.46. Hgb slightly decreased from 11.4 on admission to 10.5, baseline ~11.7. No issues with infusion line, no bleeding reported per RN.  Goal of Therapy:  INR 2-3 Monitor platelets by anticoagulation protocol: Yes   Plan:  Continue heparin gtt 950 units/hour 1500 cHL and daily HL Monitor H&H, s/sx's of bleed    Thank you for allowing pharmacy to participate in this patient's care.  Shoshana Johal L. Devin Going, [employer] PGY1 Pharmacy Resident [phone] 01/22/19      8:23 AM  Please check AMION for all [place] phone numbers After 10:00 PM, call the [place] [phone]

## 2019-01-22 NOTE — ED Notes (Signed)
ED TO INPATIENT HANDOFF REPORT  ED Nurse Name and Phone #: [facility] Name/Age/Gender Casey Collins 83 y.o. male Room/Bed: 033C/033C  Code Status   Code Status: Full Code  Home/SNF/Other Nursing Home Patient oriented to: self and situation Is this baseline? Yes   Triage Complete: Triage complete  Chief Complaint Unstable angina ([place]) [I20.0]  Triage Note Pt arrived via [facility] from [facility] unit on [address] for c/o midsternal, non-radiating chest pain. Per EMS initial bp 195/150. ASA 324mg  and nitro 0.4mg  given and helped relieve chest pain and brought pt's bp down to 155/70. Pt A&)x1 to self only. Pt is NSR w/BBB w/ST elevation on monitor. VS WNL.     Allergies No Known Allergies  Level of Care/Admitting Diagnosis ED Disposition    ED Disposition Condition [facility] Area: [place] [[id number]]  Level of Care: Progressive [102]  Admit to Progressive based on following criteria: CARDIOVASCULAR & THORACIC of moderate stability with acute coronary syndrome symptoms/low risk myocardial infarction/hypertensive urgency/arrhythmias/heart failure potentially compromising stability and stable post cardiovascular intervention patients.  Covid Evaluation: Asymptomatic Screening Protocol (No Symptoms)  Diagnosis: [facility]ina [id number]  Admitting Physician: Casey Collins  Attending Physician: Casey Collins [phone]  Estimated length of stay: past midnight tomorrow  Certification:: I certify this patient will need inpatient services for at least 2 midnights       B Medical/Surgery History Past Medical History:  Diagnosis Date  . Arthritis   . Chronic right hip pain   . Colon polyp   . [place] ([name])   . Depression   . Diverticulosis   . Hyperlipemia   . Hypertension   . Pneumonia X 2  . Stroke [facility]) ~ 2011-2016 X 4   "right side weak since; difficulty walking, speaking, read, eat"  (06/06/2015)  . Type II diabetes mellitus ([place])    Past Surgical History:  Procedure Laterality Date  . APPENDECTOMY  06/06/2015  . ARTERY EXPLORATION Left 10/12/2017   Procedure: LEFT UPPER ARM EXPLORATION AND REPAIR OF BRACHIAL ARTEY USING RIGHT LEG SAPHENOUS VEIN;  Surgeon: Rosetta Posner, MD;  Location: [place];  Service: Vascular;  Laterality: Left;  . CATARACT EXTRACTION W/ INTRAOCULAR LENS  IMPLANT, BILATERAL Bilateral 2016  . INGUINAL HERNIA REPAIR Bilateral   . LAPAROSCOPIC APPENDECTOMY N/A 06/06/2015   Procedure: APPENDECTOMY LAPAROSCOPIC;  Surgeon: Ralene Ok, MD;  Location: [place];  Service: General;  Laterality: N/A;  . TONSILLECTOMY    . VEIN HARVEST Right 10/12/2017   Procedure: SAPHENOUS VEIN HARVEST FROM RIGHT UPPER LEG;  Surgeon: Rosetta Posner, MD;  Location: [facility] OR;  Service: Vascular;  Laterality: Right;     A IV Location/Drains/Wounds Patient Lines/Drains/Airways Status   Active Line/Drains/Airways    Name:   Placement date:   Placement time:   Site:   Days:   Peripheral IV 01/21/19 Left Antecubital   01/21/19    -    Antecubital   1   External Urinary Catheter   11/17/17    1155    -   431   External Urinary Catheter   01/21/19    2236    -   1   Incision (Closed) 06/06/15 Abdomen Other (Comment)   06/06/15    1215     1326   Incision (Closed) 10/12/17 Thigh Right   10/12/17    0532     467   Incision (Closed) 10/12/17 Arm Left  10/12/17    0532     467   Incision - 3 Ports Abdomen 1: Umbilicus 2: Lower 3: Left;Mid   06/06/15    1202     1326          Intake/Output Last 24 hours No intake or output data in the 24 hours ending 01/22/19 0010  Labs/Imaging Results for orders placed or performed during the hospital encounter of 01/21/19 (from the past 48 hour(s))  Basic metabolic panel     Status: Abnormal   Collection Time: 01/21/19  7:33 PM  Result Value Ref Range   Sodium 133 (L) 135 - 145 mmol/L   Potassium 4.9 3.5 - 5.1 mmol/L   Chloride 99 98 - 111 mmol/L    CO2 22 22 - 32 mmol/L   Glucose, Bld 451 (H) 70 - 99 mg/dL   BUN 22 8 - 23 mg/dL   Creatinine, Ser 1.54 (H) 0.61 - 1.24 mg/dL   Calcium 8.9 8.9 - 10.3 mg/dL   GFR calc non Af Amer 42 (L) >60 mL/min   GFR calc Af Amer 48 (L) >60 mL/min   Anion gap 12 5 - 15    Comment: Performed at [facility], [address] [place], [place] [address]  CBC     Status: Abnormal   Collection Time: 01/21/19  7:33 PM  Result Value Ref Range   WBC 5.9 4.0 - 10.5 K/uL   RBC 3.76 (L) 4.22 - 5.81 MIL/uL   Hemoglobin 11.4 (L) 13.0 - 17.0 g/dL   HCT 37.1 (L) 39.0 - 52.0 %   MCV 98.7 80.0 - 100.0 fL   MCH 30.3 26.0 - 34.0 pg   MCHC 30.7 30.0 - 36.0 g/dL   RDW 14.5 11.5 - 15.5 %   Platelets 222 150 - 400 K/uL   nRBC 0.0 0.0 - 0.2 %    Comment: Performed at [facility], [place] [address], [place], [place] [address]  Troponin I (High Sensitivity)     Status: Abnormal   Collection Time: 01/21/19  7:33 PM  Result Value Ref Range   Troponin I (High Sensitivity) 22 (H) <18 ng/L    Comment: (NOTE) Elevated high sensitivity troponin I (hsTnI) values and significant  changes across serial measurements may suggest ACS but many other  chronic and acute conditions are known to elevate hsTnI results.  Refer to the "Links" section for chest pain algorithms and additional  guidance. Performed at [facility], [place] [address], [place], [place] [address]   Troponin I (High Sensitivity)     Status: Abnormal   Collection Time: 01/21/19  9:33 PM  Result Value Ref Range   Troponin I (High Sensitivity) 69 (H) <18 ng/L    Comment: RESULT CALLED TO, READ BACK BY AND VERIFIED WITH: RN E BANKS @2224  01/21/19 BY S GEZAHES (NOTE) Elevated high sensitivity troponin I (hsTnI) values and significant  changes across serial measurements may suggest ACS but many other  chronic and acute conditions are known to elevate hsTnI results.  Refer to the Links section for chest pain algorithms and additional   guidance. Performed at [facility], [place] [address] [place], [place] [address]   CBG monitoring, ED     Status: Abnormal   Collection Time: 01/21/19 11:56 PM  Result Value Ref Range   Glucose-Capillary 336 (H) 70 - 99 mg/dL   DG Chest 2 View  Result Date: 01/21/2019 CLINICAL DATA:  Chest pain EXAM:  CHEST - 2 VIEW COMPARISON:  December 27, 2018 FINDINGS: There is mild cardiomegaly. Tortuous ascending aorta. Aortic knob calcifications. Mildly increased interstitial markings seen at the left lung base. No large airspace consolidation or pleural effusion. The visualized skeletal structures are unremarkable. IMPRESSION: Nonspecific mildly increased interstitial markings at the left lung base which could be due to atelectasis and/or early infectious etiology Electronically Signed   By: Prudencio Pair M.D.   On: 01/21/2019 20:05    Pending Labs Unresulted Labs (From admission, onward)    Start     Ordered   01/23/19 0500  Heparin level (unfractionated)  Daily,   R     01/21/19 2248   01/23/19 0500  CBC  Daily,   R     01/21/19 2248   01/22/19 0700  Heparin level (unfractionated)  Once-Timed,   STAT     01/21/19 2248   01/22/19 0500  CBC  Tomorrow morning,   R     01/21/19 2252   01/22/19 [id number]  Basic metabolic panel  Tomorrow morning,   R     01/21/19 2252   01/21/19 2244  Hemoglobin A1c  Once,   STAT    Comments: To assess prior glycemic control    01/21/19 2244   01/21/19 2200  SARS CORONAVIRUS 2 (TAT 6-24 HRS) Nasopharyngeal Nasopharyngeal Swab  (Tier 3 (TAT 6-24 hrs))  Once,   STAT    Question Answer Comment  Is this test for diagnosis or screening Screening   Symptomatic for COVID-19 as defined by CDC No   Hospitalized for COVID-19 No   Admitted to ICU for COVID-19 No   Previously tested for COVID-19 Yes   Resident in a congregate (group) care setting No   Employed in healthcare setting No      01/21/19 2209          Vitals/Pain Today's Vitals   01/21/19 2200  01/21/19 2215 01/21/19 2300 01/22/19 0005  BP: (!) 156/68  (!) 143/58 (!) 152/68  Pulse: 81 91  71  Resp: 15 (!) 25 14 14   Temp:      TempSrc:      SpO2: 98% 98%  98%  Weight:      Height:      PainSc:        Isolation Precautions No active isolations  Medications Medications  insulin glargine (LANTUS) injection 10 Units (10 Units Subcutaneous Given 01/21/19 2358)  insulin aspart (novoLOG) injection 0-9 Units (7 Units Subcutaneous Given 01/22/19 0003)  heparin ADULT infusion 100 units/mL (25000 units/225mL sodium chloride 0.45%) (950 Units/hr Intravenous New Bag/Given 01/22/19 0006)  aspirin chewable tablet 324 mg (324 mg Oral Not Given 01/21/19 2309)    Or  aspirin suppository 300 mg ( Rectal See Alternative 01/21/19 2309)  aspirin EC tablet 81 mg (has no administration in time range)  nitroGLYCERIN (NITROSTAT) SL tablet 0.4 mg (has no administration in time range)  acetaminophen (TYLENOL) tablet 650 mg (has no administration in time range)  ondansetron (ZOFRAN) injection 4 mg (has no administration in time range)  clopidogrel (PLAVIX) tablet 75 mg (has no administration in time range)  citalopram (CELEXA) tablet 10 mg (has no administration in time range)  donepezil (ARICEPT) tablet 5 mg (5 mg Oral Given 01/21/19 2357)  gabapentin (NEURONTIN) capsule 300 mg (300 mg Oral Given 01/22/19 0001)  lisinopril (ZESTRIL) tablet 10 mg (has no administration in time range)  HYDROcodone-acetaminophen (NORCO/VICODIN) 5-325 MG per tablet 1 tablet (1 tablet Oral Given 01/22/19 0002)  multivitamin with minerals tablet 1 tablet (has no administration in time range)  Melatonin TABS 3 mg (has no administration in time range)  LORazepam (ATIVAN) tablet 0.5 mg (has no administration in time range)  simvastatin (ZOCOR) tablet 40 mg (has no administration in time range)  heparin bolus via infusion 4,000 Units (4,000 Units Intravenous Bolus from Bag 01/22/19 0006)    Mobility non-ambulatory      Focused Assessments Cardiac Assessment Handoff:  Cardiac Rhythm: Normal sinus rhythm, Bundle branch block, Other (Comment)(ST elevation) Lab Results  Component Value Date   CKTOTAL 84 01/02/2009   CKMB 14.0 (H) 12/30/2008   TROPONINI <0.30 12/09/2012   Lab Results  Component Value Date   DDIMER 18.23 (H) 10/12/2017   Does the Patient currently have chest pain? No     R Recommendations: See Admitting Provider Note  Report given to:   Additional Notes: N/A

## 2019-01-22 NOTE — Plan of Care (Signed)
  Problem: Activity: Goal: Ability to return to baseline activity level will improve Outcome: Progressing   Problem: Cardiovascular: Goal: Ability to achieve and maintain adequate cardiovascular perfusion will improve Outcome: Progressing Goal: Vascular access site(s) Level 0-1 will be maintained Outcome: Progressing   
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
## 2019-01-22 NOTE — Progress Notes (Signed)
ANTICOAGULATION CONSULT NOTE - Follow Up Consult  Pharmacy Consult for Heparin Indication: chest pain/ACS   Heparin level of <0.1 is inaccurate as patient pulled out IV line and heparin was off. Heparin drip restarted ~15:00.  Plan: Continue heparin drip at 950 units/hr 8 hr heparin level  Thank you for involving pharmacy in this patient's care.  Renold Genta, PharmD, BCPS Clinical Pharmacist Clinical phone for 01/22/2019 until 10:30p is [phone] 01/22/2019 3:44 PM  **Pharmacist phone directory can be found on [email or web address] listed under [place]**

## 2019-01-22 NOTE — Progress Notes (Addendum)
PROGRESS NOTE  Casey Collins [id number] DOB: July 22, 1935 DOA: 01/21/2019 PCP: Burnard Bunting, MD   LOS: 1 day   Brief narrative: Casey Collins is a 83 y.o. male with medical history significant of dementia, prior stroke with chronic right sided weakness and aphasia, DM2, Status post  Covid illness early November presented to ED with c/o CP.  Just prior to arrival was laying down developed dull pain in the middle of his chest. Pain was constant and only improved after nitroglycerin and aspirin were given by EMS. ED Course: EKG showed old inferior wall infarct, unchanged from priors. CXR showed bibasilar actelectasis. First trop was 25, second was 86. Creat 1.5 at baseline.  Assessment/Plan:  Principal Problem:   Unstable angina (HCC) Active Problems:   Diabetes mellitus type 2 in nonobese (HCC)   Benign essential HTN   Aphasia as late effect of stroke   History of 2019 novel coronavirus disease (COVID-19)   CKD (chronic kidney disease) stage 3, GFR 30-59 ml/min  Chest pain with elevated troponins.  Seen by cardiology.  Mild elevated troponins.  Cardiology with impression of elevated troponins from accelerated hypertension.    Currently chest pain-free. Cardiology recommends aspirin, Plavix and statin.  Coreg has been added.  Check 2D echocardiogram.  Currently on heparin drip as well.  Will follow recommendations.  Accelerated hypertension.  Coreg has been added.  Will closely monitor blood pressure.  Could add nitro if needed.  Diabetes mellitus type 2.  Continue Lantus sliding scale insulin Accu-Cheks diabetic diet closely monitor blood glucose levels.  Essential hypertension.  Continue ACE inhibitor's, Coreg has been initiated.  History of ischemic CVA with right sided residual weakness and aphasia.  Chronic.  History of COVID-19 in early November.  Asymptomatic from it.  No need for airborne or contact isolation.  VTE Prophylaxis: Heparin drip  Code  Status: I spoke with the patient's son Mr. Casey Collins and the patient's daughter who  confirmed that the patient did not wish to be on a life support/CPR so we will change the status to DNR at this time.  Family Communication:  I spoke with the patients' son in law Mr. Casey Collins and updated him with the clinical condition of the patient.  Disposition Plan:  SNF when stable.  Consultants:  Cardiology  Procedures:  None so far  Antibiotics: Anti-infectives (From admission, onward)   None    Subjective: Today, patient seen at bedside.  Denies any shortness of breath, dyspnea or dizziness.  No nausea, vomiting, cough or fever.  Objective: Vitals:   01/22/19 0758 01/22/19 0759  BP: (!) 174/76 (!) 152/66  Pulse: 60 66  Resp: 11 19  Temp:    SpO2: 96%     Intake/Output Summary (Last 24 hours) at 01/22/2019 1039 Last data filed at 01/22/2019 0300 Gross per 24 hour  Intake 64.35 ml  Output --  Net 64.35 ml   Filed Weights   01/21/19 1939 01/22/19 0052 01/22/19 0300  Weight: 79.4 kg 83 kg 82.7 kg   Body mass index is 25.79 kg/m.   Physical Exam: GENERAL: Patient is alert awake and oriented. Not in obvious distress. HENT: No scleral pallor or icterus. Pupils equally reactive to light. Oral mucosa is moist NECK: is supple, no palpable thyroid enlargement. CHEST: Clear to auscultation. No crackles or wheezes. Non tender on palpation. Diminished breath sounds bilaterally. CVS: S1 and S2 heard, no murmur. Regular rate and rhythm. No pericardial rub. ABDOMEN: Soft, non-tender, bowel sounds are present. No palpable  hepato-splenomegaly. EXTREMITIES: No edema.   CNS: Right facial droop.  Right sided weakness, left upper extremity atrophic changes noted. SKIN: warm and dry without rashes.  Data Review: I have personally reviewed the following laboratory data and studies,  CBC: Recent Labs  Lab 01/21/19 1933 01/22/19 0159  WBC 5.9 9.1  HGB 11.4* 10.5*  HCT 37.1* 33.0*  MCV 98.7 94.6   PLT 222 [id number]   Basic Metabolic Panel: Recent Labs  Lab 01/21/19 1933 01/22/19 0159  NA 133* 139  K 4.9 4.3  CL 99 104  CO2 22 26  GLUCOSE 451* 285*  BUN 22 23  CREATININE 1.54* 1.36*  CALCIUM 8.9 8.6*   Liver Function Tests: No results for input(s): AST, ALT, ALKPHOS, BILITOT, PROT, ALBUMIN in the last 168 hours. No results for input(s): LIPASE, AMYLASE in the last 168 hours. No results for input(s): AMMONIA in the last 168 hours. Cardiac Enzymes: No results for input(s): CKTOTAL, CKMB, CKMBINDEX, TROPONINI in the last 168 hours. BNP (last 3 results) No results for input(s): BNP in the last 8760 hours.  ProBNP (last 3 results) No results for input(s): PROBNP in the last 8760 hours.  CBG: Recent Labs  Lab 01/21/19 2356 01/22/19 0731  GLUCAP 336* 182*   Recent Results (from the past 240 hour(s))  SARS CORONAVIRUS 2 (TAT 6-24 HRS) Nasopharyngeal Nasopharyngeal Swab     Status: Abnormal   Collection Time: 01/21/19 10:00 PM   Specimen: Nasopharyngeal Swab  Result Value Ref Range Status   SARS Coronavirus 2 POSITIVE (A) NEGATIVE Final    Comment: RESULT CALLED TO, READ BACK BY AND VERIFIED WITH: RN Bud Face AT [id number] 01/22/2019 BY L [facility] (NOTE) SARS-CoV-2 target nucleic acids are DETECTED. The SARS-CoV-2 RNA is generally detectable in upper and lower respiratory specimens during the acute phase of infection. Positive results are indicative of the presence of SARS-CoV-2 RNA. Clinical correlation with patient history and other diagnostic information is  necessary to determine patient infection status. Positive results do not rule out bacterial infection or co-infection with other viruses.  The expected result is Negative. Fact Sheet for Patients: SugarRoll.be Fact Sheet for Healthcare Providers: https://www.woods-mathews.com/ This test is not yet approved or cleared by the [place] FDA and  has been authorized for  detection and/or diagnosis of SARS-CoV-2 by FDA under an Emergency Use Authorization (EUA). This EUA will remain  in effect (meaning this test can be used)  for the duration of the COVID-19 declaration under Section 564(b)(1) of the Act, 21 U.S.C. section 360bbb-3(b)(1), unless the authorization is terminated or revoked sooner. Performed at [facility], [place] [address] [place], [place] [address]      Studies: DG Chest 2 View  Result Date: 01/21/2019 CLINICAL DATA:  Chest pain EXAM: CHEST - 2 VIEW COMPARISON:  December 27, 2018 FINDINGS: There is mild cardiomegaly. Tortuous ascending aorta. Aortic knob calcifications. Mildly increased interstitial markings seen at the left lung base. No large airspace consolidation or pleural effusion. The visualized skeletal structures are unremarkable. IMPRESSION: Nonspecific mildly increased interstitial markings at the left lung base which could be due to atelectasis and/or early infectious etiology Electronically Signed   By: Prudencio Pair M.D.   On: 01/21/2019 20:05    Scheduled Meds: . aspirin  324 mg Oral NOW   Or  . aspirin  300 mg Rectal NOW  . aspirin EC  81 mg Oral Daily  . carvedilol  3.125 mg Oral BID WC  . citalopram  10 mg  Oral Daily  . clopidogrel  75 mg Oral Daily  . donepezil  5 mg Oral QHS  . gabapentin  300 mg Oral TID  . insulin aspart  0-9 Units Subcutaneous Q4H  . insulin glargine  10 Units Subcutaneous BID  . lisinopril  10 mg Oral Daily  . multivitamin with minerals  1 tablet Oral Daily  . simvastatin  40 mg Oral q1800    Continuous Infusions: . heparin 950 Units/hr (01/22/19 0300)     Flora Lipps, MD  [facility] 01/22/2019

## 2019-01-23 DIAGNOSIS — I358 Other nonrheumatic aortic valve disorders: Secondary | ICD-10-CM

## 2019-01-23 DIAGNOSIS — R4702 Dysphasia: Secondary | ICD-10-CM

## 2019-01-23 DIAGNOSIS — R7989 Other specified abnormal findings of blood chemistry: Secondary | ICD-10-CM

## 2019-01-23 LAB — CBC
HCT: 32.6 % — ABNORMAL LOW (ref 39.0–52.0)
Hemoglobin: 10.3 g/dL — ABNORMAL LOW (ref 13.0–17.0)
MCH: 30.1 pg (ref 26.0–34.0)
MCHC: 31.6 g/dL (ref 30.0–36.0)
MCV: 95.3 fL (ref 80.0–100.0)
Platelets: 166 10*3/uL (ref 150–400)
RBC: 3.42 MIL/uL — ABNORMAL LOW (ref 4.22–5.81)
RDW: 14.8 % (ref 11.5–15.5)
WBC: 9.3 10*3/uL (ref 4.0–10.5)
nRBC: 0 % (ref 0.0–0.2)

## 2019-01-23 LAB — GLUCOSE, CAPILLARY
Glucose-Capillary: 105 mg/dL — ABNORMAL HIGH (ref 70–99)
Glucose-Capillary: 128 mg/dL — ABNORMAL HIGH (ref 70–99)
Glucose-Capillary: 131 mg/dL — ABNORMAL HIGH (ref 70–99)
Glucose-Capillary: 147 mg/dL — ABNORMAL HIGH (ref 70–99)
Glucose-Capillary: 155 mg/dL — ABNORMAL HIGH (ref 70–99)
Glucose-Capillary: 189 mg/dL — ABNORMAL HIGH (ref 70–99)

## 2019-01-23 LAB — PHOSPHORUS: Phosphorus: 3.4 mg/dL (ref 2.5–4.6)

## 2019-01-23 LAB — COMPREHENSIVE METABOLIC PANEL
ALT: 13 U/L (ref 0–44)
AST: 20 U/L (ref 15–41)
Albumin: 3.1 g/dL — ABNORMAL LOW (ref 3.5–5.0)
Alkaline Phosphatase: 62 U/L (ref 38–126)
Anion gap: 9 (ref 5–15)
BUN: 28 mg/dL — ABNORMAL HIGH (ref 8–23)
CO2: 25 mmol/L (ref 22–32)
Calcium: 8.6 mg/dL — ABNORMAL LOW (ref 8.9–10.3)
Chloride: 107 mmol/L (ref 98–111)
Creatinine, Ser: 1.24 mg/dL (ref 0.61–1.24)
GFR calc Af Amer: >60 mL/min (ref >60)
GFR calc non Af Amer: 54 mL/min — ABNORMAL LOW (ref >60)
Glucose, Bld: 163 mg/dL — ABNORMAL HIGH (ref 70–99)
Potassium: 4.1 mmol/L (ref 3.5–5.1)
Sodium: 141 mmol/L (ref 135–145)
Total Bilirubin: 0.5 mg/dL (ref 0.3–1.2)
Total Protein: 5.7 g/dL — ABNORMAL LOW (ref 6.5–8.1)

## 2019-01-23 LAB — HEPARIN LEVEL (UNFRACTIONATED)
Heparin Unfractionated: 0.39 IU/mL (ref 0.30–0.70)
Heparin Unfractionated: 0.29 IU/mL — ABNORMAL LOW (ref 0.30–0.70)

## 2019-01-23 LAB — MAGNESIUM: Magnesium: 2 mg/dL (ref 1.7–2.4)

## 2019-01-23 MED ORDER — AMLODIPINE BESYLATE 5 MG PO TABS
5.0000 mg | ORAL_TABLET | Freq: Every day | ORAL | Status: DC
  Administered 2019-01-23 – 2019-01-26 (×4): 5 mg via ORAL
  Filled 2019-01-23 (×4): qty 1

## 2019-01-23 MED ORDER — ISOSORBIDE MONONITRATE ER 30 MG PO TB24
30.0000 mg | ORAL_TABLET | Freq: Every day | ORAL | Status: DC
  Administered 2019-01-23 – 2019-01-26 (×4): 30 mg via ORAL
  Filled 2019-01-23 (×4): qty 1

## 2019-01-23 MED ORDER — ATORVASTATIN CALCIUM 40 MG PO TABS
40.0000 mg | ORAL_TABLET | Freq: Every day | ORAL | Status: DC
  Administered 2019-01-23 – 2019-01-25 (×3): 40 mg via ORAL
  Filled 2019-01-23 (×4): qty 1

## 2019-01-23 MED ORDER — INFLUENZA VAC A&B SA ADJ QUAD 0.5 ML IM PRSY
0.5000 mL | PREFILLED_SYRINGE | INTRAMUSCULAR | Status: AC
  Administered 2019-01-24: 09:00:00 0.5 mL via INTRAMUSCULAR
  Filled 2019-01-23: qty 0.5

## 2019-01-23 MED ORDER — PNEUMOCOCCAL VAC POLYVALENT 25 MCG/0.5ML IJ INJ
0.5000 mL | INJECTION | INTRAMUSCULAR | Status: AC
  Administered 2019-01-25: 10:00:00 0.5 mL via INTRAMUSCULAR
  Filled 2019-01-23: qty 0.5

## 2019-01-23 NOTE — Progress Notes (Signed)
ANTICOAGULATION CONSULT NOTE - Follow Up Consult  Pharmacy Consult for Heparin Indication: chest pain/ACS  No Known Allergies  Patient Measurements: Height: 5' 10.5" (179.1 cm) Weight: (unable to get weight due to bed scale bieng messed up ) IBW/kg (Calculated) : 74.15 Heparin Dosing Weight: 83 kg   Vital Signs: Temp: 97.7 F (36.5 C) (12/26 2007) Temp Source: Oral (12/26 2007) BP: 130/61 (12/26 2007) Pulse Rate: 51 (12/26 2007)  Labs: Recent Labs    01/21/19 1933 01/21/19 1933 01/21/19 2133 01/22/19 0046 01/22/19 0159 01/22/19 2313 01/23/19 0535 01/23/19 0942 01/23/19 1838  HGB 11.4*  --   --   --  10.5*  --  10.3*  --   --   HCT 37.1*  --   --   --  33.0*  --  32.6*  --   --   PLT 222  --   --   --  200  --  166  --   --   HEPARINUNFRC  --    < >  --   --   --  <0.10*  --  0.39 0.29*  CREATININE 1.54*  --   --   --  1.36*  --  1.24  --   --   TROPONINIHS 22*  --  69* 156* 212*  --   --   --   --    < > = values in this interval not displayed.    Estimated Creatinine Clearance: 49 mL/min (by C-G formula based on SCr of 1.24 mg/dL).  Assessment: 25 YOM presented with 2 hours of ongoing chest pain 01/21/2019. No PTA anticoagulant. Pharmacy has been consulted to dose heparin.  Heparin level this morning was slightly subtherapeutic at 0.29. CBC stable. No s/sx of bleeding or infusion issues.   Goal of Therapy:  Anti-xa level 0.3-0.7 Monitor platelets by anticoagulation protocol: Yes   Plan:  Increase heparin gtt to 1050 units/hour Order heparin level in 8 hours with AM labs Monitor H&H, s/sx's of bleed  Thank you for allowing pharmacy to participate in this patient's care.  Antonietta Jewel, PharmD, BCCCP Clinical Pharmacist  Phone: [phone]  Please check AMION for all [place] phone numbers After 10:00 PM, call [place] [phone] 01/23/19      8:18 PM

## 2019-01-23 NOTE — Progress Notes (Signed)
PROGRESS NOTE  Casey Collins [id number] DOB: 12/30/1935 DOA: 01/21/2019 PCP: Burnard Bunting, MD   LOS: 2 days   Brief narrative:  Casey Collins is a 83 y.o. male with medical history significant of dementia, prior stroke with chronic right sided weakness and aphasia, DM2, Status post  Covid illness early November presented to ED with c/o CP.  Just prior to arrival was laying down developed dull pain in the middle of his chest. Pain was constant and only improved after nitroglycerin and aspirin were given by EMS. ED Course: EKG showed old inferior wall infarct, unchanged from priors. CXR showed bibasilar actelectasis. First trop was 57, second was 67. Creat 1.5 at baseline.  Patient was admitted to hospital for further evaluation and treatment.  Assessment/Plan:  Principal Problem:   Chest pain Active Problems:   Diabetes mellitus type 2 in nonobese (HCC)   Benign essential HTN   Aphasia as late effect of stroke   History of 2019 novel coronavirus disease (COVID-19)   CKD (chronic kidney disease) stage 3, GFR 30-59 ml/min  Chest pain with mildly elevated troponins.  Seen by cardiology.  Mild elevated troponins.  Cardiology with impression of elevated troponins from accelerated hypertension.   No further chest pain.  2D echocardiogram with ejection fraction of 50%.  Cardiology recommends aspirin, Plavix and statin and coreg.   Currently on heparin drip and will discontinue tomorrow to complete 48 hours as per cardiology.  No aggressive intervention planned so far.   Accelerated hypertension.  On Coreg , lisinopril, Imdur and amlodipine.  Unable to increase the dose of Coreg due to bradycardia.  Blood pressure is still accelerated.  Cardiology adjusting medication.  Diabetes mellitus type 2.  Continue Lantus sliding scale insulin Accu-Cheks diabetic diet.  Glucose is relatively controlled.   History of ischemic CVA with right sided residual weakness and aphasia.   Chronic.  History of COVID-19 in early November.  Asymptomatic from it.  No need for airborne or contact isolation.  VTE Prophylaxis: Heparin drip, discontinue tomorrow  Code Status: DNR   Family Communication: None today.  Updated son-in-law yesterday  Disposition Plan:  SNF likely in 1 to 2 days.  Discontinue heparin drip tomorrow.  Follow cardiology recommendations on discharge.  Consult social services.  Get physical therapy evaluation in a.m.  Consultants:  Cardiology  Procedures: 2D echocardiogram. Antibiotics: Anti-infectives (From admission, onward)   None    Subjective: Today, patient seen at bedside.    Denies overt pain or shortness of breath.  Objective: Vitals:   01/23/19 0445 01/23/19 0700  BP: (!) 141/60 (!) 166/68  Pulse: (!) 47   Resp: 13 12  Temp: 98 F (36.7 C) 98.7 F (37.1 C)  SpO2: 95%     Intake/Output Summary (Last 24 hours) at 01/23/2019 1205 Last data filed at 01/23/2019 0600 Gross per 24 hour  Intake 187.26 ml  Output 800 ml  Net -612.74 ml   Filed Weights   01/21/19 1939 01/22/19 0052 01/22/19 0300  Weight: 79.4 kg 83 kg 82.7 kg   Body mass index is 25.79 kg/m.   Physical Exam: GENERAL: Patient is alert awake and oriented. Not in obvious distress. HENT: No scleral pallor or icterus. Pupils equally reactive to light. Oral mucosa is moist NECK: is supple, no palpable thyroid enlargement. CHEST: Clear to auscultation. No crackles or wheezes. Non tender on palpation. Diminished breath sounds bilaterally. CVS: S1 and S2 heard, no murmur. Regular rate and rhythm. No pericardial rub. ABDOMEN: Soft, non-tender,  bowel sounds are present. No palpable hepato-splenomegaly. EXTREMITIES: No edema.   CNS: Right facial droop.  Right sided weakness, left upper extremity atrophic changes noted. SKIN: warm and dry without rashes.  Data Review: I have personally reviewed the following laboratory data and studies,  CBC: Recent Labs  Lab  2019-01-23 1933 01/22/19 0159 01/23/19 0535  WBC 5.9 9.1 9.3  HGB 11.4* 10.5* 10.3*  HCT 37.1* 33.0* 32.6*  MCV 98.7 94.6 95.3  PLT 222 200 [id number]   Basic Metabolic Panel: Recent Labs  Lab January 23, 2019 1933 01/22/19 0159 01/23/19 0535  NA 133* 139 141  K 4.9 4.3 4.1  CL 99 104 107  CO2 22 26 25   GLUCOSE 451* 285* 163*  BUN 22 23 28*  CREATININE 1.54* 1.36* 1.24  CALCIUM 8.9 8.6* 8.6*  MG  --   --  2.0  PHOS  --   --  3.4   Liver Function Tests: Recent Labs  Lab 01/23/19 0535  AST 20  ALT 13  ALKPHOS 62  BILITOT 0.5  PROT 5.7*  ALBUMIN 3.1*   No results for input(s): LIPASE, AMYLASE in the last 168 hours. No results for input(s): AMMONIA in the last 168 hours. Cardiac Enzymes: No results for input(s): CKTOTAL, CKMB, CKMBINDEX, TROPONINI in the last 168 hours. BNP (last 3 results) No results for input(s): BNP in the last 8760 hours.  ProBNP (last 3 results) No results for input(s): PROBNP in the last 8760 hours.  CBG: Recent Labs  Lab 01/22/19 1943 01/23/19 0006 01/23/19 0450 01/23/19 0717 01/23/19 1111  GLUCAP 160* 189* 147* 128* 131*   Recent Results (from the past 240 hour(s))  SARS CORONAVIRUS 2 (TAT 6-24 HRS) Nasopharyngeal Nasopharyngeal Swab     Status: Abnormal   Collection Time: 01/23/19 10:00 PM   Specimen: Nasopharyngeal Swab  Result Value Ref Range Status   SARS Coronavirus 2 POSITIVE (A) NEGATIVE Final    Comment: RESULT CALLED TO, READ BACK BY AND VERIFIED WITH: RN Bud Face AT [id number] 01/22/2019 BY L BENFIELD (NOTE) SARS-CoV-2 target nucleic acids are DETECTED. The SARS-CoV-2 RNA is generally detectable in upper and lower respiratory specimens during the acute phase of infection. Positive results are indicative of the presence of SARS-CoV-2 RNA. Clinical correlation with patient history and other diagnostic information is  necessary to determine patient infection status. Positive results do not rule out bacterial infection or co-infection with  other viruses.  The expected result is Negative. Fact Sheet for Patients: SugarRoll.be Fact Sheet for Healthcare Providers: https://www.woods-mathews.com/ This test is not yet approved or cleared by the [place] FDA and  has been authorized for detection and/or diagnosis of SARS-CoV-2 by FDA under an Emergency Use Authorization (EUA). This EUA will remain  in effect (meaning this test can be used)  for the duration of the COVID-19 declaration under Section 564(b)(1) of the Act, 21 U.S.C. section 360bbb-3(b)(1), unless the authorization is terminated or revoked sooner. Performed at [facility], [place] [address] [place], [place] [address]      Studies: DG Chest 2 View  Result Date: 01/23/2019 CLINICAL DATA:  Chest pain EXAM: CHEST - 2 VIEW COMPARISON:  December 27, 2018 FINDINGS: There is mild cardiomegaly. Tortuous ascending aorta. Aortic knob calcifications. Mildly increased interstitial markings seen at the left lung base. No large airspace consolidation or pleural effusion. The visualized skeletal structures are unremarkable. IMPRESSION: Nonspecific mildly increased interstitial markings at the left lung base which could be due to atelectasis and/or early infectious etiology  Electronically Signed   By: Prudencio Pair M.D.   On: 01/21/2019 20:05   ECHOCARDIOGRAM COMPLETE  Result Date: 01/22/2019   ECHOCARDIOGRAM REPORT   Patient Name:   JALAND BURDETTE [id number] Date of Exam: 01/22/2019 Medical Rec #:  [id number]             Height:       70.5 in Accession #:    [id number]            Weight:       182.3 lb Date of Birth:  1935/04/25             BSA:          2.02 m Patient Age:    36 years              BP:           152/66 mmHg Patient Gender: M                     HR:           59 bpm. Exam Location:  Inpatient Procedure: 2D Echo, Color Doppler and Cardiac Doppler Indications:    R07.9* Chest pain, unspecified  History:        Patient has prior  history of Echocardiogram examinations, most                 recent 12/02/2012. Risk Factors:Hypertension, Diabetes and                 Dyslipidemia. COVID+ 12/01/18.  Sonographer:    Raquel Sarna Senior RDCS Referring Phys: [place]  1. Left ventricular ejection fraction, by visual estimation, is 50%. The left ventricle has low normal to mildly reduced function. Left ventricular septal wall thickness was mildly increased. There is mildly increased left ventricular hypertrophy.  2. Elevated left ventricular end-diastolic pressure.  3. Left ventricular diastolic parameters are consistent with Grade I diastolic dysfunction (impaired relaxation).  4. The left ventricle demonstrates global hypokinesis.  5. Global right ventricle has normal systolic function.The right ventricular size is normal. No increase in right ventricular wall thickness.  6. Left atrial size was normal.  7. Right atrial size was normal.  8. Mild mitral annular calcification.  9. The mitral valve is degenerative. Mild mitral valve regurgitation. 10. The tricuspid valve is grossly normal. 11. The aortic valve is tricuspid. Aortic valve regurgitation is mild. Mild aortic valve sclerosis without stenosis. 12. The pulmonic valve was grossly normal. Pulmonic valve regurgitation is not visualized. 13. The inferior vena cava is dilated in size with <50% respiratory variability, suggesting right atrial pressure of 15 mmHg. FINDINGS  Left Ventricle: Left ventricular ejection fraction, by visual estimation, is 50%. The left ventricle has low normal function. The left ventricle demonstrates global hypokinesis. The left ventricular internal cavity size was the left ventricle is normal in size. There is mildly increased left ventricular hypertrophy. Concentric left ventricular hypertrophy. Left ventricular diastolic parameters are consistent with Grade I diastolic dysfunction (impaired relaxation). Elevated left ventricular end-diastolic  pressure. Right Ventricle: The right ventricular size is normal. No increase in right ventricular wall thickness. Global RV systolic function is has normal systolic function. Left Atrium: Left atrial size was normal in size. Right Atrium: Right atrial size was normal in size Pericardium: There is no evidence of pericardial effusion. Mitral Valve: The mitral valve is degenerative in appearance. There is mild thickening of the mitral valve leaflet(s). Mild mitral annular calcification.  Mild mitral valve regurgitation. Tricuspid Valve: The tricuspid valve is grossly normal. Tricuspid valve regurgitation is not demonstrated. Aortic Valve: The aortic valve is tricuspid. . There is mild thickening of the aortic valve. Aortic valve regurgitation is mild. Mild aortic valve sclerosis is present, with no evidence of aortic valve stenosis. There is mild thickening of the aortic valve. Pulmonic Valve: The pulmonic valve was grossly normal. Pulmonic valve regurgitation is not visualized. Pulmonic regurgitation is not visualized. Aorta: The aortic root is normal in size and structure. Venous: The inferior vena cava is dilated in size with less than 50% respiratory variability, suggesting right atrial pressure of 15 mmHg. IAS/Shunts: No atrial level shunt detected by color flow Doppler.  LEFT VENTRICLE PLAX 2D LVIDd:         4.90 cm       Diastology LVIDs:         3.10 cm       LV e' lateral:   6.53 cm/s LV PW:         1.10 cm       LV E/e' lateral: 13.2 LV IVS:        1.20 cm       LV e' medial:    4.46 cm/s LVOT diam:     2.00 cm       LV E/e' medial:  19.3 LV SV:         75 ml LV SV Index:   36.74 LVOT Area:     3.14 cm  LV Volumes (MOD) LV area d, A2C:    34.70 cm LV area d, A4C:    35.00 cm LV area s, A2C:    21.90 cm LV area s, A4C:    23.90 cm LV major d, A2C:   8.91 cm LV major d, A4C:   9.25 cm LV major s, A2C:   7.34 cm LV major s, A4C:   7.86 cm LV vol d, MOD A2C: 112.0 ml LV vol d, MOD A4C: 107.0 ml LV vol s, MOD  A2C: 54.1 ml LV vol s, MOD A4C: 59.8 ml LV SV MOD A2C:     57.9 ml LV SV MOD A4C:     107.0 ml LV SV MOD BP:      51.9 ml LEFT ATRIUM           Index       RIGHT ATRIUM           Index LA diam:      3.70 cm 1.83 cm/m  RA Area:     17.20 cm LA Vol (A2C): 62.9 ml 31.18 ml/m RA Volume:   44.00 ml  21.81 ml/m LA Vol (A4C): 64.7 ml 32.07 ml/m  AORTIC VALVE LVOT Vmax:   81.60 cm/s LVOT Vmean:  60.000 cm/s LVOT VTI:    0.212 m  AORTA Ao Root diam: 3.40 cm MITRAL VALVE MV Area (PHT): 2.20 cm              SHUNTS MV PHT:        100.05 msec           Systemic VTI:  0.21 m MV Decel Time: 345 msec              Systemic Diam: 2.00 cm MV E velocity: 86.10 cm/s  103 cm/s MV A velocity: 101.00 cm/s 70.3 cm/s MV E/A ratio:  0.85        1.5  Kate Sable MD Electronically signed by Jamesetta So  Bronson Ing MD Signature Date/Time: 01/22/2019/11:01:07 AM    Final     Scheduled Meds: . amLODipine  5 mg Oral Daily  . aspirin EC  81 mg Oral Daily  . atorvastatin  40 mg Oral q1800  . carvedilol  3.125 mg Oral BID WC  . citalopram  10 mg Oral Daily  . clopidogrel  75 mg Oral Daily  . donepezil  5 mg Oral QHS  . gabapentin  300 mg Oral TID  . insulin aspart  0-9 Units Subcutaneous Q4H  . insulin glargine  10 Units Subcutaneous BID  . isosorbide mononitrate  30 mg Oral Daily  . lisinopril  10 mg Oral Daily  . multivitamin with minerals  1 tablet Oral Daily    Continuous Infusions: . heparin 950 Units/hr (01/23/19 0447)     Flora Lipps, MD  [facility] 01/23/2019

## 2019-01-23 NOTE — Progress Notes (Signed)
Progress Note  Patient Name: Casey Collins Date of Encounter: 01/23/2019  Primary Cardiologist: No primary care provider on file. Dr. Bronson Ing  Subjective   Shakes his head to answer questions, denies any chest discomfort.  Aphasic.  Inpatient Medications    Scheduled Meds: . amLODipine  5 mg Oral Daily  . aspirin EC  81 mg Oral Daily  . atorvastatin  40 mg Oral q1800  . carvedilol  3.125 mg Oral BID WC  . citalopram  10 mg Oral Daily  . clopidogrel  75 mg Oral Daily  . donepezil  5 mg Oral QHS  . gabapentin  300 mg Oral TID  . insulin aspart  0-9 Units Subcutaneous Q4H  . insulin glargine  10 Units Subcutaneous BID  . isosorbide mononitrate  30 mg Oral Daily  . lisinopril  10 mg Oral Daily  . multivitamin with minerals  1 tablet Oral Daily   Continuous Infusions: . heparin 950 Units/hr (01/23/19 0447)   PRN Meds: acetaminophen, HYDROcodone-acetaminophen, LORazepam, Melatonin, nitroGLYCERIN, ondansetron (ZOFRAN) IV   Vital Signs    Vitals:   01/22/19 1945 01/22/19 1950 01/23/19 0445 01/23/19 0700  BP:  (!) 174/87 (!) 141/60 (!) 166/68  Pulse: 66  (!) 47   Resp: 11 19 13 12   Temp:   98 F (36.7 C) 98.7 F (37.1 C)  TempSrc:   Axillary Axillary  SpO2: 98%  95%   Weight:      Height:        Intake/Output Summary (Last 24 hours) at 01/23/2019 1039 Last data filed at 01/23/2019 0600 Gross per 24 hour  Intake 187.26 ml  Output 800 ml  Net -612.74 ml   Last 3 Weights 01/23/2019 01/22/2019 01/22/2019  Weight (lbs) (No Data) 182 lb 5.1 oz 182 lb 15.7 oz  Weight (kg) (No Data) 82.7 kg 83 kg  Some encounter information is confidential and restricted. Go to Review Flowsheets activity to see all data.      Telemetry    Sinus bradycardia in the 40s, no adverse arrhythmias- Personally Reviewed  ECG    Original EKG shows subtle J-point ST elevation noted especially in the inferior leads, subsequent EKG showed resolution of this.- Personally  Reviewed  Physical Exam   GEN: No acute distress.  Comfortable laying in bed nods to answer questions since he is aphasic Neck: No JVD Cardiac: RRR, no murmurs, rubs, or gallops.  Respiratory: Clear to auscultation bilaterally. GI: Soft, nontender, non-distended  MS: No edema; No deformity. Neuro:   Aphasic Psych: Normal affect   Labs    High Sensitivity Troponin:   Recent Labs  Lab 01/21/19 1933 01/21/19 2133 01/22/19 0046 01/22/19 0159  TROPONINIHS 22* 69* 156* 212*      Chemistry Recent Labs  Lab 01/21/19 1933 01/22/19 0159 01/23/19 0535  NA 133* 139 141  K 4.9 4.3 4.1  CL 99 104 107  CO2 22 26 25   GLUCOSE 451* 285* 163*  BUN 22 23 28*  CREATININE 1.54* 1.36* 1.24  CALCIUM 8.9 8.6* 8.6*  PROT  --   --  5.7*  ALBUMIN  --   --  3.1*  AST  --   --  20  ALT  --   --  13  ALKPHOS  --   --  62  BILITOT  --   --  0.5  GFRNONAA 42* 48* 54*  GFRAA 48* 56* >60  ANIONGAP 12 9 9      Hematology Recent Labs  Lab  01/21/19 1933 01/22/19 0159 01/23/19 0535  WBC 5.9 9.1 9.3  RBC 3.76* 3.49* 3.42*  HGB 11.4* 10.5* 10.3*  HCT 37.1* 33.0* 32.6*  MCV 98.7 94.6 95.3  MCH 30.3 30.1 30.1  MCHC 30.7 31.8 31.6  RDW 14.5 14.3 14.8  PLT 222 200 166    BNPNo results for input(s): BNP, PROBNP in the last 168 hours.   DDimer No results for input(s): DDIMER in the last 168 hours.   Radiology    DG Chest 2 View  Result Date: 01/21/2019 CLINICAL DATA:  Chest pain EXAM: CHEST - 2 VIEW COMPARISON:  December 27, 2018 FINDINGS: There is mild cardiomegaly. Tortuous ascending aorta. Aortic knob calcifications. Mildly increased interstitial markings seen at the left lung base. No large airspace consolidation or pleural effusion. The visualized skeletal structures are unremarkable. IMPRESSION: Nonspecific mildly increased interstitial markings at the left lung base which could be due to atelectasis and/or early infectious etiology Electronically Signed   By: Prudencio Pair M.D.    On: 01/21/2019 20:05   ECHOCARDIOGRAM COMPLETE  Result Date: 01/22/2019   ECHOCARDIOGRAM REPORT   Patient Name:   Casey Collins [id number] Date of Exam: 01/22/2019 Medical Rec #:  [id number]             Height:       70.5 in Accession #:    [id number]            Weight:       182.3 lb Date of Birth:  04/20/1935             BSA:          2.02 m Patient Age:    83 years              BP:           152/66 mmHg Patient Gender: M                     HR:           59 bpm. Exam Location:  Inpatient Procedure: 2D Echo, Color Doppler and Cardiac Doppler Indications:    R07.9* Chest pain, unspecified  History:        Patient has prior history of Echocardiogram examinations, most                 recent 12/02/2012. Risk Factors:Hypertension, Diabetes and                 Dyslipidemia. COVID+ 12/01/18.  Sonographer:    Raquel Sarna Senior RDCS Referring Phys: [place]  1. Left ventricular ejection fraction, by visual estimation, is 50%. The left ventricle has low normal to mildly reduced function. Left ventricular septal wall thickness was mildly increased. There is mildly increased left ventricular hypertrophy.  2. Elevated left ventricular end-diastolic pressure.  3. Left ventricular diastolic parameters are consistent with Grade I diastolic dysfunction (impaired relaxation).  4. The left ventricle demonstrates global hypokinesis.  5. Global right ventricle has normal systolic function.The right ventricular size is normal. No increase in right ventricular wall thickness.  6. Left atrial size was normal.  7. Right atrial size was normal.  8. Mild mitral annular calcification.  9. The mitral valve is degenerative. Mild mitral valve regurgitation. 10. The tricuspid valve is grossly normal. 11. The aortic valve is tricuspid. Aortic valve regurgitation is mild. Mild aortic valve sclerosis without stenosis. 12. The pulmonic valve was grossly normal. Pulmonic valve regurgitation is not visualized.  13. The inferior vena  cava is dilated in size with <50% respiratory variability, suggesting right atrial pressure of 15 mmHg. FINDINGS  Left Ventricle: Left ventricular ejection fraction, by visual estimation, is 50%. The left ventricle has low normal function. The left ventricle demonstrates global hypokinesis. The left ventricular internal cavity size was the left ventricle is normal in size. There is mildly increased left ventricular hypertrophy. Concentric left ventricular hypertrophy. Left ventricular diastolic parameters are consistent with Grade I diastolic dysfunction (impaired relaxation). Elevated left ventricular end-diastolic pressure. Right Ventricle: The right ventricular size is normal. No increase in right ventricular wall thickness. Global RV systolic function is has normal systolic function. Left Atrium: Left atrial size was normal in size. Right Atrium: Right atrial size was normal in size Pericardium: There is no evidence of pericardial effusion. Mitral Valve: The mitral valve is degenerative in appearance. There is mild thickening of the mitral valve leaflet(s). Mild mitral annular calcification. Mild mitral valve regurgitation. Tricuspid Valve: The tricuspid valve is grossly normal. Tricuspid valve regurgitation is not demonstrated. Aortic Valve: The aortic valve is tricuspid. . There is mild thickening of the aortic valve. Aortic valve regurgitation is mild. Mild aortic valve sclerosis is present, with no evidence of aortic valve stenosis. There is mild thickening of the aortic valve. Pulmonic Valve: The pulmonic valve was grossly normal. Pulmonic valve regurgitation is not visualized. Pulmonic regurgitation is not visualized. Aorta: The aortic root is normal in size and structure. Venous: The inferior vena cava is dilated in size with less than 50% respiratory variability, suggesting right atrial pressure of 15 mmHg. IAS/Shunts: No atrial level shunt detected by color flow Doppler.  LEFT VENTRICLE PLAX 2D LVIDd:          4.90 cm       Diastology LVIDs:         3.10 cm       LV e' lateral:   6.53 cm/s LV PW:         1.10 cm       LV E/e' lateral: 13.2 LV IVS:        1.20 cm       LV e' medial:    4.46 cm/s LVOT diam:     2.00 cm       LV E/e' medial:  19.3 LV SV:         75 ml LV SV Index:   36.74 LVOT Area:     3.14 cm  LV Volumes (MOD) LV area d, A2C:    34.70 cm LV area d, A4C:    35.00 cm LV area s, A2C:    21.90 cm LV area s, A4C:    23.90 cm LV major d, A2C:   8.91 cm LV major d, A4C:   9.25 cm LV major s, A2C:   7.34 cm LV major s, A4C:   7.86 cm LV vol d, MOD A2C: 112.0 ml LV vol d, MOD A4C: 107.0 ml LV vol s, MOD A2C: 54.1 ml LV vol s, MOD A4C: 59.8 ml LV SV MOD A2C:     57.9 ml LV SV MOD A4C:     107.0 ml LV SV MOD BP:      51.9 ml LEFT ATRIUM           Index       RIGHT ATRIUM           Index LA diam:      3.70 cm 1.83 cm/m  RA Area:  17.20 cm LA Vol (A2C): 62.9 ml 31.18 ml/m RA Volume:   44.00 ml  21.81 ml/m LA Vol (A4C): 64.7 ml 32.07 ml/m  AORTIC VALVE LVOT Vmax:   81.60 cm/s LVOT Vmean:  60.000 cm/s LVOT VTI:    0.212 m  AORTA Ao Root diam: 3.40 cm MITRAL VALVE MV Area (PHT): 2.20 cm              SHUNTS MV PHT:        100.05 msec           Systemic VTI:  0.21 m MV Decel Time: 345 msec              Systemic Diam: 2.00 cm MV E velocity: 86.10 cm/s  103 cm/s MV A velocity: 101.00 cm/s 70.3 cm/s MV E/A ratio:  0.85        1.5  Kate Sable MD Electronically signed by Kate Sable MD Signature Date/Time: 01/22/2019/11:01:07 AM    Final     Cardiac Studies   Echo 01/22/2019 -EF 50% mild aortic valve sclerosis.  Mild MR.  Patient Profile     83 y.o. male with elevated troponin in the setting of accelerated hypertension, history of stroke dementia hypertension hyperlipidemia  Assessment & Plan    Chest pain/mildly elevated troponin in the setting of hypertension/unstable angina -Likely myocardial injury in the setting of accelerated hypertension.  Continue with conservative medical  management currently taking aspirin statin clopidogrel. -Carvedilol 3.125 mg, very low-dose was added for blood pressure control and antianginal benefit.  Cannot increase given bradycardia, currently asymptomatic -I will add low-dose isosorbide 30 mg once a day to help with potential anginal symptoms as well as blood pressure. -Noninvasive therapy.  EF overall reassuring at 50%.  On simvastatin 40 mg.  I will change to atorvastatin 40 mg for less drug drug interaction especially if we are starting amlodipine for blood pressure. -recommend discontinuing heparin drip tomorrow which will give [place] 48 hours of treatment.  Accelerated hypertension/sinus bradycardia -Blood pressure continues to be elevated.  Heart rate is in the 40s currently.  Asymptomatic.  Okay to continue with low-dose carvedilol. -Adding amlodipine 5 mg once a day.  Diabetes with hypertension -Per primary team  Prior Covid infection in early November.       For questions or updates, please contact [place] Please consult [email or web address] for contact info under        Signed, Candee Furbish, MD  01/23/2019, 10:39 AM

## 2019-01-23 NOTE — Progress Notes (Signed)
ANTICOAGULATION CONSULT NOTE - Follow Up Consult  Pharmacy Consult for Heparin Indication: chest pain/ACS  Heparin level undetectable but RN reports that pt pulled out his line again.  IVT is at bedside now and will resume heparin when IV established.  No change for now, recheck level.  Wynona Neat, PharmD, BCPS 01/23/2019 12:10 AM

## 2019-01-23 NOTE — Progress Notes (Signed)
ANTICOAGULATION CONSULT NOTE - Follow Up Consult  Pharmacy Consult for Heparin Indication: chest pain/ACS  No Known Allergies  Patient Measurements: Height: 5' 10.5" (179.1 cm) Weight: (unable to get weight due to bed scale bieng messed up ) IBW/kg (Calculated) : 74.15 Heparin Dosing Weight: 83 kg   Vital Signs: Temp: 98.7 F (37.1 C) (12/26 0700) Temp Source: Axillary (12/26 0700) BP: 166/68 (12/26 0700) Pulse Rate: 47 (12/26 0445)  Labs: Recent Labs    01/21/19 1933 01/21/19 1933 01/21/19 2133 01/22/19 0046 01/22/19 0159 01/22/19 1436 01/22/19 2313 01/23/19 0535 01/23/19 0942  HGB 11.4*  --   --   --  10.5*  --   --  10.3*  --   HCT 37.1*  --   --   --  33.0*  --   --  32.6*  --   PLT 222  --   --   --  200  --   --  166  --   HEPARINUNFRC  --    < >  --   --   --  <0.10* <0.10*  --  0.39  CREATININE 1.54*  --   --   --  1.36*  --   --  1.24  --   TROPONINIHS 22*  --  69* 156* 212*  --   --   --   --    < > = values in this interval not displayed.    Estimated Creatinine Clearance: 49 mL/min (by C-G formula based on SCr of 1.24 mg/dL).  Assessment: 71 YOM presented with 2 hours of ongoing chest pain 01/21/2019. No PTA anticoagulant. Pharmacy has been consulted to dose heparin.  Heparin level this morning therapeutic at 0.39. Hgb slightly decreased from 11.7 to 10.3 on 11.7. Overnight RN had issues with pt pulling line out but no further issues with infusion after it was restarted, no bleeding noted.  Goal of Therapy:  Anti-xa level 0.3-0.7 Monitor platelets by anticoagulation protocol: Yes   Plan:  Continue heparin gtt at 950 units/hour 1800 cHL and daily HL Monitor H&H, s/sx's of bleed    Thank you for allowing pharmacy to participate in this patient's care.  Sherren Kerns, PharmD PGY1 Acute Care Pharmacy Resident 01/23/19      11:10 AM  Please check AMION for all [facility] phone numbers After 10:00 PM, call the [facility] [phone]

## 2019-01-24 ENCOUNTER — Other Ambulatory Visit: Payer: Self-pay

## 2019-01-24 ENCOUNTER — Encounter (HOSPITAL_COMMUNITY): Payer: Self-pay | Admitting: Internal Medicine

## 2019-01-24 DIAGNOSIS — I10 Essential (primary) hypertension: Secondary | ICD-10-CM

## 2019-01-24 DIAGNOSIS — R001 Bradycardia, unspecified: Secondary | ICD-10-CM

## 2019-01-24 LAB — CBC
HCT: 32.9 % — ABNORMAL LOW (ref 39.0–52.0)
Hemoglobin: 10.7 g/dL — ABNORMAL LOW (ref 13.0–17.0)
MCH: 30.6 pg (ref 26.0–34.0)
MCHC: 32.5 g/dL (ref 30.0–36.0)
MCV: 94 fL (ref 80.0–100.0)
Platelets: 180 10*3/uL (ref 150–400)
RBC: 3.5 MIL/uL — ABNORMAL LOW (ref 4.22–5.81)
RDW: 14.7 % (ref 11.5–15.5)
WBC: 9.2 10*3/uL (ref 4.0–10.5)
nRBC: 0 % (ref 0.0–0.2)

## 2019-01-24 LAB — HEPARIN LEVEL (UNFRACTIONATED)
Heparin Unfractionated: 0.42 IU/mL (ref 0.30–0.70)
Heparin Unfractionated: 0.31 IU/mL (ref 0.30–0.70)

## 2019-01-24 LAB — GLUCOSE, CAPILLARY
Glucose-Capillary: 138 mg/dL — ABNORMAL HIGH (ref 70–99)
Glucose-Capillary: 164 mg/dL — ABNORMAL HIGH (ref 70–99)
Glucose-Capillary: 176 mg/dL — ABNORMAL HIGH (ref 70–99)
Glucose-Capillary: 51 mg/dL — ABNORMAL LOW (ref 70–99)
Glucose-Capillary: 61 mg/dL — ABNORMAL LOW (ref 70–99)
Glucose-Capillary: 72 mg/dL (ref 70–99)
Glucose-Capillary: 86 mg/dL (ref 70–99)
Glucose-Capillary: 89 mg/dL (ref 70–99)

## 2019-01-24 LAB — COMPREHENSIVE METABOLIC PANEL
ALT: 13 U/L (ref 0–44)
AST: 19 U/L (ref 15–41)
Albumin: 3.1 g/dL — ABNORMAL LOW (ref 3.5–5.0)
Alkaline Phosphatase: 65 U/L (ref 38–126)
Anion gap: 7 (ref 5–15)
BUN: 28 mg/dL — ABNORMAL HIGH (ref 8–23)
CO2: 28 mmol/L (ref 22–32)
Calcium: 9.1 mg/dL (ref 8.9–10.3)
Chloride: 107 mmol/L (ref 98–111)
Creatinine, Ser: 1.27 mg/dL — ABNORMAL HIGH (ref 0.61–1.24)
GFR calc Af Amer: >60 mL/min (ref >60)
GFR calc non Af Amer: 53 mL/min — ABNORMAL LOW (ref >60)
Glucose, Bld: 56 mg/dL — ABNORMAL LOW (ref 70–99)
Potassium: 4.2 mmol/L (ref 3.5–5.1)
Sodium: 142 mmol/L (ref 135–145)
Total Bilirubin: 0.6 mg/dL (ref 0.3–1.2)
Total Protein: 5.7 g/dL — ABNORMAL LOW (ref 6.5–8.1)

## 2019-01-24 LAB — MAGNESIUM: Magnesium: 2 mg/dL (ref 1.7–2.4)

## 2019-01-24 LAB — MRSA PCR SCREENING: MRSA by PCR: NEGATIVE

## 2019-01-24 MED ORDER — INSULIN GLARGINE 100 UNIT/ML ~~LOC~~ SOLN
5.0000 [IU] | Freq: Two times a day (BID) | SUBCUTANEOUS | Status: DC
  Administered 2019-01-24 – 2019-01-26 (×6): 5 [IU] via SUBCUTANEOUS
  Filled 2019-01-24 (×6): qty 0.05

## 2019-01-24 MED ORDER — ENOXAPARIN SODIUM 30 MG/0.3ML ~~LOC~~ SOLN
30.0000 mg | SUBCUTANEOUS | Status: DC
  Administered 2019-01-24 – 2019-01-25 (×2): 30 mg via SUBCUTANEOUS
  Filled 2019-01-24 (×2): qty 0.3

## 2019-01-24 NOTE — Progress Notes (Signed)
Progress Note  Patient Name: Casey Collins Date of Encounter: 01/24/2019  Primary Cardiologist: No primary care provider on file. Dr. Bronson Ing  Subjective   Shakes his head to answer questions, no CP, no SOB.  Aphasic.  Inpatient Medications    Scheduled Meds: . amLODipine  5 mg Oral Daily  . aspirin EC  81 mg Oral Daily  . atorvastatin  40 mg Oral q1800  . citalopram  10 mg Oral Daily  . clopidogrel  75 mg Oral Daily  . donepezil  5 mg Oral QHS  . gabapentin  300 mg Oral TID  . insulin aspart  0-9 Units Subcutaneous Q4H  . insulin glargine  10 Units Subcutaneous BID  . isosorbide mononitrate  30 mg Oral Daily  . lisinopril  10 mg Oral Daily  . multivitamin with minerals  1 tablet Oral Daily  . pneumococcal 23 valent vaccine  0.5 mL Intramuscular Tomorrow-1000   Continuous Infusions: . heparin 1,050 Units/hr (01/24/19 0645)   PRN Meds: acetaminophen, HYDROcodone-acetaminophen, LORazepam, Melatonin, nitroGLYCERIN, ondansetron (ZOFRAN) IV   Vital Signs    Vitals:   01/23/19 1804 01/23/19 2007 01/24/19 0413 01/24/19 0811  BP: 123/62 130/61 118/61 138/62  Pulse: (!) 53 (!) 51 (!) 45 (!) 50  Resp:  15 15 14   Temp:  97.7 F (36.5 C) 98.4 F (36.9 C) 97.9 F (36.6 C)  TempSrc:  Oral Axillary Oral  SpO2:  95% 95% 93%  Weight:      Height:        Intake/Output Summary (Last 24 hours) at 01/24/2019 1008 Last data filed at 01/24/2019 0931 Gross per 24 hour  Intake 720 ml  Output 1700 ml  Net -980 ml   Last 3 Weights 01/24/2019 01/23/2019 01/22/2019  Weight (lbs) (No Data) (No Data) 182 lb 5.1 oz  Weight (kg) (No Data) (No Data) 82.7 kg  Some encounter information is confidential and restricted. Go to Review Flowsheets activity to see all data.      Telemetry    Sinus bradycardia in the 40s, no adverse arrhythmias- Personally Reviewed  ECG    Original EKG shows subtle J-point ST elevation noted especially in the inferior leads, subsequent EKG  showed resolution of this.- Personally Reviewed  Physical Exam  GEN: Well nourished, well developed, in no acute distress comfortable HEENT: normal  Neck: no JVD, carotid bruits, or masses Cardiac: RRR; no murmurs, rubs, or gallops,no edema  Respiratory:  clear to auscultation bilaterally, normal work of breathing GI: soft, nontender, nondistended, + BS MS: no deformity or atrophy  Skin: warm and dry, no rash Neuro:  Alert and Oriented x 3, Aphasic Psych: euthymic mood, full affect   Labs    High Sensitivity Troponin:   Recent Labs  Lab 01/21/19 1933 01/21/19 2133 01/22/19 0046 01/22/19 0159  TROPONINIHS 22* 69* 156* 212*      Chemistry Recent Labs  Lab 01/22/19 0159 01/23/19 0535 01/24/19 0343  NA 139 141 142  K 4.3 4.1 4.2  CL 104 107 107  CO2 26 25 28   GLUCOSE 285* 163* 56*  BUN 23 28* 28*  CREATININE 1.36* 1.24 1.27*  CALCIUM 8.6* 8.6* 9.1  PROT  --  5.7* 5.7*  ALBUMIN  --  3.1* 3.1*  AST  --  20 19  ALT  --  13 13  ALKPHOS  --  62 65  BILITOT  --  0.5 0.6  GFRNONAA 48* 54* 53*  GFRAA 56* >60 >60  ANIONGAP 9 9  7     Hematology Recent Labs  Lab 01/22/19 0159 01/23/19 0535 01/24/19 0343  WBC 9.1 9.3 9.2  RBC 3.49* 3.42* 3.50*  HGB 10.5* 10.3* 10.7*  HCT 33.0* 32.6* 32.9*  MCV 94.6 95.3 94.0  MCH 30.1 30.1 30.6  MCHC 31.8 31.6 32.5  RDW 14.3 14.8 14.7  PLT 200 166 180    BNPNo results for input(s): BNP, PROBNP in the last 168 hours.   DDimer No results for input(s): DDIMER in the last 168 hours.   Radiology    No results found.  Cardiac Studies   Echo 01/22/2019 -EF 50% mild aortic valve sclerosis.  Mild MR.  Patient Profile     83 y.o. male with elevated troponin in the setting of accelerated hypertension, history of stroke dementia hypertension hyperlipidemia  Assessment & Plan    Chest pain/mildly elevated troponin in the setting of hypertension/unstable angina -Likely myocardial injury in the setting of accelerated  hypertension.  Continue with conservative medical management currently taking aspirin statin clopidogrel. -Carvedilol 3.125 mg has been DC'd because of persistent bradycardia. -On low-dose isosorbide 30 mg once a day started on 12/26 to help with potential anginal symptoms as well as blood pressure. -Noninvasive therapy.  EF overall reassuring at 50%.  Was on simvastatin 40 mg.  I changed to atorvastatin 40 mg for less drug drug interaction especially with  amlodipine for blood pressure. -will DC heparin drip now has had 48 hours of treatment.  Accelerated hypertension/sinus bradycardia -Added amlodipine 5 mg once a day on 12/26. BP improved.  Diabetes with hypertension -Per primary team  Prior Covid infection in early November.  We will go ahead and sign off.  Please let us know if we can be of further assistance.  Recommend continued follow-up with his primary care.  For questions or updates, please contact [facility] Please consult [email or web address] for contact info under        Signed, Candee Furbish, MD  01/24/2019, 10:08 AM

## 2019-01-24 NOTE — Progress Notes (Signed)
Hypoglycemic Event  CBG: 61  Treatment: orange juice  Symptoms: none  Follow-up CBG: Time: 0820hr CBG Result: 72   Possible Reasons for Event:poor appetite  Comments/MD notified: Dr. Grandville Silos to review insulin orders.     Myrtis Hopping

## 2019-01-24 NOTE — Evaluation (Signed)
Physical Therapy Evaluation Patient Details Name: Casey Collins MRN: [id number] DOB: [birth year] Today's Date: 01/24/2019   History of Present Illness  83 y.o. male with elevated troponin in the setting of accelerated hypertension, history of stroke dementia hypertension hyperlipidemia.Covid in November.  Clinical Impression  Pt admitted with above diagnosis. Pt was able to sit EOB with min guard assist and stand at EOB with min assist.  Pt states he is wheelchair level PTA.  Will need SNF for therapy at d/c and wants to go to [place] per pt.   Pt currently with functional limitations due to the deficits listed below (see PT Problem List). Pt will benefit from skilled PT to increase their independence and safety with mobility to allow discharge to the venue listed below.      Follow Up Recommendations SNF;Supervision/Assistance - 24 hour    Equipment Recommendations  None recommended by PT    Recommendations for Other Services       Precautions / Restrictions Precautions Precautions: Fall Restrictions Weight Bearing Restrictions: No      Mobility  Bed Mobility Overal bed mobility: Needs Assistance Bed Mobility: Supine to Sit     Supine to sit: Min assist     General bed mobility comments: A little assist for trunk elevation  Transfers Overall transfer level: Needs assistance Equipment used: 2 person hand held assist Transfers: Sit to/from Stand Sit to Stand: Min assist         General transfer comment: Assist to power up but once up can stand with min guard assist at EOB.  Tried to get pt to take steps to [facility] but pt could not.   Ambulation/Gait             General Gait Details: Did not walk PTA per pt  Stairs            Wheelchair Mobility    Modified Rankin (Stroke Patients Only)       Balance Overall balance assessment: Needs assistance Sitting-balance support: No upper extremity supported;Feet supported Sitting  balance-Leahy Scale: Fair     Standing balance support: Bilateral upper extremity supported;During functional activity Standing balance-Leahy Scale: Poor Standing balance comment: relies on UE support                             Pertinent Vitals/Pain Pain Assessment: No/denies pain    Home Living Family/patient expects to be discharged to:: Skilled nursing facility                 Additional Comments: Pt states he was at [employer] and wants to go back    Prior Function Level of Independence: Needs assistance   Gait / Transfers Assistance Needed: wheelchair level per pt and he stated if wheelchair close enough he could transfer by himself  ADL's / Homemaking Assistance Needed: Facility assists        Hand Dominance   Dominant Hand: Right    Extremity/Trunk Assessment   Upper Extremity Assessment Upper Extremity Assessment: Defer to OT evaluation    Lower Extremity Assessment Lower Extremity Assessment: RLE deficits/detail RLE Deficits / Details: grossly 3/5    Cervical / Trunk Assessment Cervical / Trunk Assessment: Kyphotic  Communication   Communication: Expressive difficulties  Cognition Arousal/Alertness: Awake/alert Behavior During Therapy: WFL for tasks assessed/performed Overall Cognitive Status: History of cognitive impairments - at baseline Area of Impairment: Orientation  Orientation Level: Place;Time;Situation                    General Comments      Exercises     Assessment/Plan    PT Assessment Patient needs continued PT services  PT Problem List Decreased activity tolerance;Decreased balance;Decreased mobility;Decreased knowledge of use of DME;Decreased safety awareness;Decreased knowledge of precautions;Cardiopulmonary status limiting activity       PT Treatment Interventions DME instruction;Functional mobility training;Therapeutic activities;Therapeutic exercise;Balance  training;Patient/family education;Wheelchair mobility training    PT Goals (Current goals can be found in the Care Plan section)  Acute Rehab PT Goals Patient Stated Goal: to go back to [employer] PT Goal Formulation: With patient Time For Goal Achievement: 02/07/19 Potential to Achieve Goals: Good    Frequency Min 2X/week   Barriers to discharge Decreased caregiver support      Co-evaluation               AM-PAC PT "6 Clicks" Mobility  Outcome Measure Help needed turning from your back to your side while in a flat bed without using bedrails?: A Lot Help needed moving from lying on your back to sitting on the side of a flat bed without using bedrails?: A Lot Help needed moving to and from a bed to a chair (including a wheelchair)?: A Lot Help needed standing up from a chair using your arms (e.g., wheelchair or bedside chair)?: A Lot Help needed to walk in hospital room?: Total Help needed climbing 3-5 steps with a railing? : Total 6 Click Score: 10    End of Session Equipment Utilized During Treatment: Gait belt Activity Tolerance: Patient limited by fatigue Patient left: in bed;with call bell/phone within reach Nurse Communication: Mobility status PT Visit Diagnosis: Unsteadiness on feet (R26.81);Muscle weakness (generalized) (M62.81)    Time: [id number] PT Time Calculation (min) (ACUTE ONLY): 13 min   Charges:   PT Evaluation $PT Eval Moderate Complexity: 1 Mod          Sumner Kirchman W,PT Acute Rehabilitation Services Pager:  [phone]  Office:  [place] 01/24/2019, 4:15 PM

## 2019-01-24 NOTE — Progress Notes (Signed)
Hypoglycemic Event  CBG: 51  Treatment: 4 oz orange juice  Symptoms: Asymptomatic  Follow-up CBG: Time: 0508 CBG Result: 86  Elesa Hacker

## 2019-01-24 NOTE — Progress Notes (Signed)
Patient's HR 45-50 bpm this am, BP 138/62; denies dizziness.  NP Sharolyn Douglas notified.  Will hold coreg until seen by Dr. Marlou Porch.

## 2019-01-24 NOTE — Progress Notes (Signed)
PROGRESS NOTE    Casey Collins  [id number] DOB: 10-26-1935 DOA: 01/21/2019 PCP: Burnard Bunting, MD   Brief Narrative:  Casey Collins [name] a 83 y.o.malewith medical history significant ofdementia, prior stroke with chronic right sided weakness and aphasia, DM2, Status post  Covid illness early November presented to ED with c/o CP.Just prior to arrival was laying down developed dull pain in the middle of his chest. Pain was constant and only improved after nitroglycerin and aspirin were given by EMS. ED Course:EKG showed old inferior wall infarct, unchanged from priors. CXR showed bibasilar actelectasis. First trop was 102, second was 59. Creat 1.5 at baseline.  Patient was admitted to hospital for further evaluation and treatment.    Assessment & Plan:   Principal Problem:   Chest pain Active Problems:   Diabetes mellitus type 2 in nonobese (HCC)   Benign essential HTN   Aphasia as late effect of stroke   History of 2019 novel coronavirus disease (COVID-19)   CKD (chronic kidney disease) stage 3, GFR 30-59 ml/min   Accelerated hypertension   Sinus bradycardia   1 chest pain with elevated troponin/unstable angina Patient presented with chest pain with mildly elevated troponins.  2D echo done with a EF of 50%, left ventricular septal wall thickness mildly increased, mild Lee increased LVH, grade 1 diastolic dysfunction, left ventricle demonstrates global hypokinesis.  Patient seen by cardiology initial concern was chest pain secondary to uncontrolled blood pressure.  Patient was on IV heparin x48 hours which has subsequently been discontinued by cardiology today.  Patient seen by cardiology who feels patient likely had a myocardial injury in the setting of accelerated hypertension.  Cardiology recommended conservative medical management.  Patient initially was placed on Coreg which has been discontinued due to persistent bradycardia.  Continue low-dose isosorbide,  aspirin, statin, Plavix, Norvasc.  Cardiology recommended noninvasive therapy.  Outpatient follow-up.  2.  Accelerated hypertension Coreg discontinued due to persistent bradycardia.  Patient started on Norvasc which we will continue for now.  Continue Imdur, lisinopril.  Outpatient follow-up.  3.  Diabetes mellitus type 2 Hemoglobin A1c 7.1 on 01/21/2019.  Patient noted to have a blood glucose level of 61 this morning with patient being asymptomatic.  Will decrease Lantus to 5 units twice daily.  Continue sliding scale insulin.  4.  Sinus bradycardia Coreg discontinued.  Follow.  5.  History of ischemic CVA with right-sided residual weakness and aphasia Currently stable.  Continue aspirin and Plavix for secondary stroke prophylaxis.  Continue statin.  Outpatient follow-up.  6.  History of COVID-19 infection in early November Currently asymptomatic.  Patient is not hypoxic.  No need for airborne or contact isolation at this time.   DVT prophylaxis: Heparin>>>> Lovenox Code Status: DNR Family Communication: No family at bedside. Disposition Plan: SNF hopefully in the next 24 to 48 hours.   Consultants:   Cardiology: Dr. Bronson Ing 01/22/2019  Procedures:   Chest x-ray 01/21/2019  2D echo 01/22/2019  Antimicrobials:  None  Subjective: Patient laying in bed.  Aphasic.  Denies any chest pain or shortness of breath.  Seems to be asking when he may be discharged.  Objective: Vitals:   01/23/19 2007 01/24/19 0413 01/24/19 0811 01/24/19 1505  BP: 130/61 118/61 138/62 (!) 171/66  Pulse: (!) 51 (!) 45 (!) 50 (!) 57  Resp: 15 15 14 17   Temp: 97.7 F (36.5 C) 98.4 F (36.9 C) 97.9 F (36.6 C) 98 F (36.7 C)  TempSrc: Oral Axillary Oral Oral  SpO2:  95% 95% 93% 97%  Weight:      Height:        Intake/Output Summary (Last 24 hours) at 01/24/2019 2021 Last data filed at 01/24/2019 1800 Gross per 24 hour  Intake 960 ml  Output 2300 ml  Net -1340 ml   Filed Weights    01/22/19 0052 01/22/19 0300  Weight: 83 kg 82.7 kg    Examination:  General exam: Appears calm and comfortable  Respiratory system: Clear to auscultation. Respiratory effort normal. Cardiovascular system: S1 & S2 heard, RRR. No JVD, murmurs, rubs, gallops or clicks. No pedal edema. Gastrointestinal system: Abdomen is nondistended, soft and nontender. No organomegaly or masses felt. Normal bowel sounds heard. Central nervous system: Alert and oriented.  Aphasic.  Slight right facial droop.  Right-sided weakness.  Left upper extremity with atrophic changes. Extremities: Symmetric 5 x 5 power. Skin: No rashes, lesions or ulcers Psychiatry: Judgement and insight appear fair. Mood & affect appropriate.     Data Reviewed: I have personally reviewed following labs and imaging studies  CBC: Recent Labs  Lab 01/21/19 1933 01/22/19 0159 01/23/19 0535 01/24/19 0343  WBC 5.9 9.1 9.3 9.2  HGB 11.4* 10.5* 10.3* 10.7*  HCT 37.1* 33.0* 32.6* 32.9*  MCV 98.7 94.6 95.3 94.0  PLT 222 200 166 [id number]   Basic Metabolic Panel: Recent Labs  Lab 01/21/19 1933 01/22/19 0159 01/23/19 0535 01/24/19 0343  NA 133* 139 141 142  K 4.9 4.3 4.1 4.2  CL 99 104 107 107  CO2 22 26 25 28   GLUCOSE 451* 285* 163* 56*  BUN 22 23 28* 28*  CREATININE 1.54* 1.36* 1.24 1.27*  CALCIUM 8.9 8.6* 8.6* 9.1  MG  --   --  2.0 2.0  PHOS  --   --  3.4  --    GFR: Estimated Creatinine Clearance: 47.9 mL/min (A) (by C-G formula based on SCr of 1.27 mg/dL (H)). Liver Function Tests: Recent Labs  Lab 01/23/19 0535 01/24/19 0343  AST 20 19  ALT 13 13  ALKPHOS 62 65  BILITOT 0.5 0.6  PROT 5.7* 5.7*  ALBUMIN 3.1* 3.1*   No results for input(s): LIPASE, AMYLASE in the last 168 hours. No results for input(s): AMMONIA in the last 168 hours. Coagulation Profile: No results for input(s): INR, PROTIME in the last 168 hours. Cardiac Enzymes: No results for input(s): CKTOTAL, CKMB, CKMBINDEX, TROPONINI in the last 168  hours. BNP (last 3 results) No results for input(s): PROBNP in the last 8760 hours. HbA1C: No results for input(s): HGBA1C in the last 72 hours. CBG: Recent Labs  Lab 01/24/19 0754 01/24/19 0819 01/24/19 1215 01/24/19 1720 01/24/19 1929  GLUCAP 61* 72 164* 138* 176*   Lipid Profile: No results for input(s): CHOL, HDL, LDLCALC, TRIG, CHOLHDL, LDLDIRECT in the last 72 hours. Thyroid Function Tests: No results for input(s): TSH, T4TOTAL, FREET4, T3FREE, THYROIDAB in the last 72 hours. Anemia Panel: No results for input(s): VITAMINB12, FOLATE, FERRITIN, TIBC, IRON, RETICCTPCT in the last 72 hours. Sepsis Labs: No results for input(s): PROCALCITON, LATICACIDVEN in the last 168 hours.  Recent Results (from the past 240 hour(s))  SARS CORONAVIRUS 2 (TAT 6-24 HRS) Nasopharyngeal Nasopharyngeal Swab     Status: Abnormal   Collection Time: 01/21/19 10:00 PM   Specimen: Nasopharyngeal Swab  Result Value Ref Range Status   SARS Coronavirus 2 POSITIVE (A) NEGATIVE Final    Comment: RESULT CALLED TO, READ BACK BY AND VERIFIED WITH: RN Bud Face AT [id number] 01/22/2019  BY L BENFIELD (NOTE) SARS-CoV-2 target nucleic acids are DETECTED. The SARS-CoV-2 RNA is generally detectable in upper and lower respiratory specimens during the acute phase of infection. Positive results are indicative of the presence of SARS-CoV-2 RNA. Clinical correlation with patient history and other diagnostic information is  necessary to determine patient infection status. Positive results do not rule out bacterial infection or co-infection with other viruses.  The expected result is Negative. Fact Sheet for Patients: SugarRoll.be Fact Sheet for Healthcare Providers: https://www.woods-mathews.com/ This test is not yet approved or cleared by the [place] FDA and  has been authorized for detection and/or diagnosis of SARS-CoV-2 by FDA under an Emergency Use Authorization (EUA).  This EUA will remain  in effect (meaning this test can be used)  for the duration of the COVID-19 declaration under Section 564(b)(1) of the Act, 21 U.S.C. section 360bbb-3(b)(1), unless the authorization is terminated or revoked sooner. Performed at [facility], [place] [address] [place], [place] [address]   MRSA PCR Screening     Status: None   Collection Time: 01/24/19  6:09 PM   Specimen: Nasal Mucosa; Nasopharyngeal  Result Value Ref Range Status   MRSA by PCR NEGATIVE NEGATIVE Final    Comment:        The GeneXpert MRSA Assay (FDA approved for NASAL specimens only), is one component of a comprehensive MRSA colonization surveillance program. It is not intended to diagnose MRSA infection nor to guide or monitor treatment for MRSA infections. Performed at [facility], [place] [address], [place], [place] [address]          Radiology Studies: No results found.      Scheduled Meds: . amLODipine  5 mg Oral Daily  . aspirin EC  81 mg Oral Daily  . atorvastatin  40 mg Oral q1800  . citalopram  10 mg Oral Daily  . clopidogrel  75 mg Oral Daily  . donepezil  5 mg Oral QHS  . enoxaparin (LOVENOX) injection  30 mg Subcutaneous Q24H  . gabapentin  300 mg Oral TID  . insulin aspart  0-9 Units Subcutaneous Q4H  . insulin glargine  5 Units Subcutaneous BID  . isosorbide mononitrate  30 mg Oral Daily  . lisinopril  10 mg Oral Daily  . multivitamin with minerals  1 tablet Oral Daily  . pneumococcal 23 valent vaccine  0.5 mL Intramuscular Tomorrow-1000   Continuous Infusions:   LOS: 3 days    Time spent: 35 mins    Irine Seal, MD [facility]  If 7PM-7AM, please contact night-coverage [email or web address] 01/24/2019, 8:21 PM

## 2019-01-24 NOTE — Progress Notes (Signed)
ANTICOAGULATION CONSULT NOTE - Follow Up Consult  Pharmacy Consult for Heparin Indication: chest pain/ACS  No Known Allergies  Patient Measurements: Height: 5' 10.5" (179.1 cm) Weight: (Scale inacurate) IBW/kg (Calculated) : 74.15 Heparin Dosing Weight: 83 kg   Vital Signs: Temp: 97.9 F (36.6 C) (12/27 0811) Temp Source: Oral (12/27 0811) BP: 138/62 (12/27 0811) Pulse Rate: 50 (12/27 0811)  Labs: Recent Labs    01/21/19 1933 01/21/19 2133 01/22/19 0046 01/22/19 0159 01/23/19 0535 01/23/19 0942 01/23/19 1838 01/24/19 0343  HGB  --   --   --  10.5* 10.3*  --   --  10.7*  HCT  --   --   --  33.0* 32.6*  --   --  32.9*  PLT  --   --   --  200 166  --   --  180  HEPARINUNFRC   < >  --   --   --   --  0.39 0.29* 0.42  CREATININE  --   --   --  1.36* 1.24  --   --  1.27*  TROPONINIHS  --  69* 156* 212*  --   --   --   --    < > = values in this interval not displayed.    Estimated Creatinine Clearance: 47.9 mL/min (A) (by C-G formula based on SCr of 1.27 mg/dL (H)).  Assessment: 35 YOM presented with 2 hours of ongoing chest pain 01/21/2019. No PTA anticoagulant. Pharmacy has been consulted to dose heparin.  Heparin level back in therapeutic range this morning at 0.42. H&H slightly decreased from admission but no issues with infusion or bleeding reported per RN.  Goal of Therapy:  Anti-xa level 0.3-0.7 Monitor platelets by anticoagulation protocol: Yes   Plan:  Continue heparin gtt at 1050 units/hour Confirm HL with 8hr level at 1200 Monitor H&H, s/sx's of bleed     Thank you for allowing pharmacy to participate in this patient's care.  Graycen Sadlon L. Devin Going, [place] PGY1 Pharmacy Resident [phone] 01/24/19      8:19 AM  Please check AMION for all [facility] phone numbers After 10:00 PM, call the [facility] [phone]

## 2019-01-25 DIAGNOSIS — G309 Alzheimer's disease, unspecified: Secondary | ICD-10-CM | POA: Diagnosis not present

## 2019-01-25 DIAGNOSIS — R079 Chest pain, unspecified: Secondary | ICD-10-CM

## 2019-01-25 DIAGNOSIS — M1611 Unilateral primary osteoarthritis, right hip: Secondary | ICD-10-CM | POA: Diagnosis not present

## 2019-01-25 DIAGNOSIS — Z794 Long term (current) use of insulin: Secondary | ICD-10-CM | POA: Diagnosis not present

## 2019-01-25 DIAGNOSIS — F0280 Dementia in other diseases classified elsewhere without behavioral disturbance: Secondary | ICD-10-CM | POA: Diagnosis not present

## 2019-01-25 DIAGNOSIS — I10 Essential (primary) hypertension: Secondary | ICD-10-CM

## 2019-01-25 DIAGNOSIS — I69351 Hemiplegia and hemiparesis following cerebral infarction affecting right dominant side: Secondary | ICD-10-CM | POA: Diagnosis not present

## 2019-01-25 DIAGNOSIS — E1122 Type 2 diabetes mellitus with diabetic chronic kidney disease: Secondary | ICD-10-CM | POA: Diagnosis not present

## 2019-01-25 LAB — GLUCOSE, CAPILLARY
Glucose-Capillary: 102 mg/dL — ABNORMAL HIGH (ref 70–99)
Glucose-Capillary: 118 mg/dL — ABNORMAL HIGH (ref 70–99)
Glucose-Capillary: 124 mg/dL — ABNORMAL HIGH (ref 70–99)
Glucose-Capillary: 143 mg/dL — ABNORMAL HIGH (ref 70–99)
Glucose-Capillary: 144 mg/dL — ABNORMAL HIGH (ref 70–99)
Glucose-Capillary: 206 mg/dL — ABNORMAL HIGH (ref 70–99)
Glucose-Capillary: 213 mg/dL — ABNORMAL HIGH (ref 70–99)

## 2019-01-25 LAB — COMPREHENSIVE METABOLIC PANEL
ALT: 16 U/L (ref 0–44)
AST: 21 U/L (ref 15–41)
Albumin: 3.2 g/dL — ABNORMAL LOW (ref 3.5–5.0)
Alkaline Phosphatase: 71 U/L (ref 38–126)
Anion gap: 10 (ref 5–15)
BUN: 29 mg/dL — ABNORMAL HIGH (ref 8–23)
CO2: 26 mmol/L (ref 22–32)
Calcium: 9.3 mg/dL (ref 8.9–10.3)
Chloride: 101 mmol/L (ref 98–111)
Creatinine, Ser: 1.59 mg/dL — ABNORMAL HIGH (ref 0.61–1.24)
GFR calc Af Amer: 46 mL/min — ABNORMAL LOW (ref >60)
GFR calc non Af Amer: 40 mL/min — ABNORMAL LOW (ref >60)
Glucose, Bld: 130 mg/dL — ABNORMAL HIGH (ref 70–99)
Potassium: 4.6 mmol/L (ref 3.5–5.1)
Sodium: 137 mmol/L (ref 135–145)
Total Bilirubin: 0.7 mg/dL (ref 0.3–1.2)
Total Protein: 5.9 g/dL — ABNORMAL LOW (ref 6.5–8.1)

## 2019-01-25 LAB — MAGNESIUM: Magnesium: 1.9 mg/dL (ref 1.7–2.4)

## 2019-01-25 MED ORDER — ATORVASTATIN CALCIUM 40 MG PO TABS: 40.0000 mg | ORAL_TABLET | Freq: Every day | ORAL | 0 refills | Status: AC

## 2019-01-25 MED ORDER — LORAZEPAM 0.5 MG PO TABS: 0.5000 mg | ORAL_TABLET | Freq: Three times a day (TID) | ORAL | 0 refills | Status: DC | PRN

## 2019-01-25 MED ORDER — AMLODIPINE BESYLATE 5 MG PO TABS: 5.0000 mg | ORAL_TABLET | Freq: Every day | ORAL | 0 refills | Status: AC

## 2019-01-25 MED ORDER — ISOSORBIDE MONONITRATE ER 30 MG PO TB24: 30.0000 mg | ORAL_TABLET | Freq: Every day | ORAL | Status: AC

## 2019-01-25 MED ORDER — INSULIN GLARGINE 100 UNIT/ML ~~LOC~~ SOLN: 5.0000 [IU] | Freq: Two times a day (BID) | SUBCUTANEOUS | 11 refills | Status: DC

## 2019-01-25 NOTE — Discharge Summary (Signed)
Physician Discharge Summary  Casey Collins [id number] DOB: 16-Jun-1977 DOA: 01/21/2019  PCP: Burnard Bunting, MD  Admit date: 01/21/2019 Discharge date: 01/25/2019  Admitted From: [facility].  Disposition: SNF   Recommendations for Outpatient Follow-up:  1. Follow up with PCP in 1-2 weeks 2. Please obtain BMP/CBC in one week 3. Please follow up WITH CARDIOLOGY in one week    Discharge Condition:stable.  CODE STATUS DNR  Diet recommendation: Heart Healthy   Brief/Interim Summary: Casey Schill [name] a 83 y.o.malewith medical history significant ofdementia, prior stroke with chronic right sided weakness and aphasia, DM2, Status post Covid illness early November presented to ED with c/o CP.Just prior to arrival was laying down developed dull pain in the middle of his chest. Pain was constant and only improved after nitroglycerin and aspirin were given by EMS.ED Course:EKG showed old inferior wall infarct, unchanged from priors. CXR showed bibasilar actelectasis. First trop was 39, second was 39. Creat 1.5 at baseline.Patient was admitted to hospital for further evaluation and treatment.  Discharge Diagnoses:  Principal Problem:   Chest pain Active Problems:   Diabetes mellitus type 2 in nonobese (HCC)   Benign essential HTN   Aphasia as late effect of stroke   History of 2019 novel coronavirus disease (COVID-19)   CKD (chronic kidney disease) stage 3, GFR 30-59 ml/min   Accelerated hypertension   Sinus bradycardia   1 chest pain with elevated troponin/unstable angina Patient presented with chest pain with mildly elevated troponins.  2D echo done with a EF of 50%, left ventricular septal wall thickness mildly increased, mild Lee increased LVH, grade 1 diastolic dysfunction, left ventricle demonstrates global hypokinesis.  Patient seen by cardiology initial concern was chest pain secondary to uncontrolled blood pressure.  Patient was on IV heparin x48 hours which  has subsequently been discontinued by cardiology .  Patient seen by cardiology who feels patient likely had a myocardial injury in the setting of accelerated hypertension.  Cardiology recommended conservative medical management.  Patient initially was placed on Coreg which has been discontinued due to persistent bradycardia.  Continue low-dose isosorbide, aspirin, statin, Plavix, Norvasc.  Cardiology recommended noninvasive therapy.  Outpatient follow-up.  2.  Accelerated hypertension Coreg discontinued due to persistent bradycardia.  Patient started on Norvasc which we will continue for now.  Continue Imdur, lisinopril.  Outpatient follow-up.  3.  Diabetes mellitus type 2 Hemoglobin A1c 7.1 on 01/21/2019.  Patient noted to have a blood glucose level of 61 this morning with patient being asymptomatic.  Will decrease Lantus to 5 units twice daily.  Continue sliding scale insulin.  4.  Sinus bradycardia Coreg discontinued.  Follow.  5.  History of ischemic CVA with right-sided residual weakness and aphasia Currently stable.  Continue aspirin and Plavix for secondary stroke prophylaxis.  Continue statin.  Outpatient follow-up.  6.  History of COVID-19 infection in early November Currently asymptomatic.  Patient is not hypoxic.  No need for airborne or contact isolation at this time.   Discharge Instructions   Allergies as of 01/25/2019   No Known Allergies     Medication List    STOP taking these medications   Basaglar KwikPen 100 UNIT/ML Sopn Replaced by: insulin glargine 100 UNIT/ML injection   HYDROcodone-acetaminophen 5-325 MG tablet Commonly known as: Norco   ramipril 5 MG capsule Commonly known as: ALTACE   simvastatin 40 MG tablet Commonly known as: ZOCOR   tizanidine 2 MG capsule Commonly known as: ZANAFLEX     TAKE these medications  amLODipine 5 MG tablet Commonly known as: NORVASC Take 1 tablet (5 mg total) by mouth daily. Start taking on: January 26, 2019   aspirin EC 81 MG tablet Take 81 mg by mouth daily.   atorvastatin 40 MG tablet Commonly known as: LIPITOR Take 1 tablet (40 mg total) by mouth daily at 6 PM.   citalopram 10 MG tablet Commonly known as: CELEXA Take 1 tablet (10 mg total) by mouth daily.   clopidogrel 75 MG tablet Commonly known as: PLAVIX Take 1 tablet (75 mg total) by mouth daily.   donepezil 5 MG tablet Commonly known as: ARICEPT Take 5 mg by mouth at bedtime.   gabapentin 300 MG capsule Commonly known as: NEURONTIN Take 300 mg by mouth 3 (three) times daily.   insulin glargine 100 UNIT/ML injection Commonly known as: LANTUS Inject 0.05 mLs (5 Units total) into the skin 2 (two) times daily. Replaces: Basaglar KwikPen 100 UNIT/ML Sopn   insulin regular 100 units/mL injection Commonly known as: NOVOLIN R Inject 5 Units into the skin every morning.   isosorbide mononitrate 30 MG 24 hr tablet Commonly known as: IMDUR Take 1 tablet (30 mg total) by mouth daily. Start taking on: January 26, 2019   lisinopril 10 MG tablet Commonly known as: ZESTRIL TAKE 1 TABLET BY MOUTH DAILY   LORazepam 0.5 MG tablet Commonly known as: ATIVAN Take 0.5 mg by mouth 3 (three) times daily as needed for anxiety.   Melatonin 3 MG Tabs Take 1 tablet by mouth daily as needed (For sleep).   multivitamin tablet Take 1 tablet by mouth daily.   ondansetron 4 MG disintegrating tablet Commonly known as: Zofran ODT 4mg  ODT q4 hours prn nausea/vomit What changed:   how much to take  how to take this  when to take this  reasons to take this  additional instructions   vitamin C 1000 MG tablet Take 1 tablet (1,000 mg total) by mouth daily.       No Known Allergies  Consultations:  Cardiology.    Procedures/Studies: DG Chest 2 View  Result Date: 01/21/2019 CLINICAL DATA:  Chest pain EXAM: CHEST - 2 VIEW COMPARISON:  December 27, 2018 FINDINGS: There is mild cardiomegaly. Tortuous ascending aorta.  Aortic knob calcifications. Mildly increased interstitial markings seen at the left lung base. No large airspace consolidation or pleural effusion. The visualized skeletal structures are unremarkable. IMPRESSION: Nonspecific mildly increased interstitial markings at the left lung base which could be due to atelectasis and/or early infectious etiology Electronically Signed   By: Prudencio Pair M.D.   On: 01/21/2019 20:05   CT Head Wo Contrast  Result Date: 12/27/2018 CLINICAL DATA:  Fall, weakness EXAM: CT HEAD WITHOUT CONTRAST TECHNIQUE: Contiguous axial images were obtained from the base of the skull through the vertex without intravenous contrast. COMPARISON:  12/09/2012 FINDINGS: Brain: Old left temporoparietal infarct. Old bilateral basal ganglia lacunar infarcts. There is atrophy and chronic small vessel disease changes. Vascular: No hyperdense vessel or unexpected calcification. Skull: No acute calvarial abnormality. Sinuses/Orbits: Visualized paranasal sinuses and mastoids clear. Orbital soft tissues unremarkable. Other: None IMPRESSION: Multiple old bilateral basal ganglia lacunar infarcts. Old left temporoparietal infarct. Atrophy, chronic microvascular disease. No acute intracranial abnormality. Electronically Signed   By: Rolm Baptise M.D.   On: 12/27/2018 01:48   DG Chest Port 1 View  Result Date: 12/27/2018 CLINICAL DATA:  Fever, multiple falls EXAM: PORTABLE CHEST 1 VIEW COMPARISON:  Multiple priors, most recent radiograph 11/17/2017 FINDINGS: Bibasilar interstitial  opacities with some more coalescent subpleural opacity in the left lung base. No pneumothorax or effusion. Tortuosity of the aorta is similar to comparison radiographs. The aorta itself is calcified. Remaining cardiomediastinal contours are unchanged from comparison studies. IMPRESSION: 1. Bibasilar interstitial opacities with more coalescent subpleural opacity in the left lung base. Findings could represent atypical pneumonia  including viral etiology. 2. Aortic atherosclerosis with tortuosity of the aorta. Electronically Signed   By: Lovena Le M.D.   On: 12/27/2018 03:58   ECHOCARDIOGRAM COMPLETE  Result Date: 01/22/2019   ECHOCARDIOGRAM REPORT   Patient Name:   Casey Collins [id number] Date of Exam: 01/22/2019 Medical Rec #:  [id number]             Height:       70.5 in Accession #:    [id number]            Weight:       182.3 lb Date of Birth:  17-Jun-1935             BSA:          2.02 m Patient Age:    56 years              BP:           152/66 mmHg Patient Gender: M                     HR:           59 bpm. Exam Location:  Inpatient Procedure: 2D Echo, Color Doppler and Cardiac Doppler Indications:    R07.9* Chest pain, unspecified  History:        Patient has prior history of Echocardiogram examinations, most                 recent 12/02/2012. Risk Factors:Hypertension, Diabetes and                 Dyslipidemia. COVID+ 12/01/18.  Sonographer:    Raquel Sarna Senior RDCS Referring Phys: [place]  1. Left ventricular ejection fraction, by visual estimation, is 50%. The left ventricle has low normal to mildly reduced function. Left ventricular septal wall thickness was mildly increased. There is mildly increased left ventricular hypertrophy.  2. Elevated left ventricular end-diastolic pressure.  3. Left ventricular diastolic parameters are consistent with Grade I diastolic dysfunction (impaired relaxation).  4. The left ventricle demonstrates global hypokinesis.  5. Global right ventricle has normal systolic function.The right ventricular size is normal. No increase in right ventricular wall thickness.  6. Left atrial size was normal.  7. Right atrial size was normal.  8. Mild mitral annular calcification.  9. The mitral valve is degenerative. Mild mitral valve regurgitation. 10. The tricuspid valve is grossly normal. 11. The aortic valve is tricuspid. Aortic valve regurgitation is mild. Mild aortic valve sclerosis  without stenosis. 12. The pulmonic valve was grossly normal. Pulmonic valve regurgitation is not visualized. 13. The inferior vena cava is dilated in size with <50% respiratory variability, suggesting right atrial pressure of 15 mmHg. FINDINGS  Left Ventricle: Left ventricular ejection fraction, by visual estimation, is 50%. The left ventricle has low normal function. The left ventricle demonstrates global hypokinesis. The left ventricular internal cavity size was the left ventricle is normal in size. There is mildly increased left ventricular hypertrophy. Concentric left ventricular hypertrophy. Left ventricular diastolic parameters are consistent with Grade I diastolic dysfunction (impaired relaxation). Elevated left ventricular end-diastolic pressure. Right Ventricle: The  right ventricular size is normal. No increase in right ventricular wall thickness. Global RV systolic function is has normal systolic function. Left Atrium: Left atrial size was normal in size. Right Atrium: Right atrial size was normal in size Pericardium: There is no evidence of pericardial effusion. Mitral Valve: The mitral valve is degenerative in appearance. There is mild thickening of the mitral valve leaflet(s). Mild mitral annular calcification. Mild mitral valve regurgitation. Tricuspid Valve: The tricuspid valve is grossly normal. Tricuspid valve regurgitation is not demonstrated. Aortic Valve: The aortic valve is tricuspid. . There is mild thickening of the aortic valve. Aortic valve regurgitation is mild. Mild aortic valve sclerosis is present, with no evidence of aortic valve stenosis. There is mild thickening of the aortic valve. Pulmonic Valve: The pulmonic valve was grossly normal. Pulmonic valve regurgitation is not visualized. Pulmonic regurgitation is not visualized. Aorta: The aortic root is normal in size and structure. Venous: The inferior vena cava is dilated in size with less than 50% respiratory variability, suggesting  right atrial pressure of 15 mmHg. IAS/Shunts: No atrial level shunt detected by color flow Doppler.  LEFT VENTRICLE PLAX 2D LVIDd:         4.90 cm       Diastology LVIDs:         3.10 cm       LV e' lateral:   6.53 cm/s LV PW:         1.10 cm       LV E/e' lateral: 13.2 LV IVS:        1.20 cm       LV e' medial:    4.46 cm/s LVOT diam:     2.00 cm       LV E/e' medial:  19.3 LV SV:         75 ml LV SV Index:   36.74 LVOT Area:     3.14 cm  LV Volumes (MOD) LV area d, A2C:    34.70 cm LV area d, A4C:    35.00 cm LV area s, A2C:    21.90 cm LV area s, A4C:    23.90 cm LV major d, A2C:   8.91 cm LV major d, A4C:   9.25 cm LV major s, A2C:   7.34 cm LV major s, A4C:   7.86 cm LV vol d, MOD A2C: 112.0 ml LV vol d, MOD A4C: 107.0 ml LV vol s, MOD A2C: 54.1 ml LV vol s, MOD A4C: 59.8 ml LV SV MOD A2C:     57.9 ml LV SV MOD A4C:     107.0 ml LV SV MOD BP:      51.9 ml LEFT ATRIUM           Index       RIGHT ATRIUM           Index LA diam:      3.70 cm 1.83 cm/m  RA Area:     17.20 cm LA Vol (A2C): 62.9 ml 31.18 ml/m RA Volume:   44.00 ml  21.81 ml/m LA Vol (A4C): 64.7 ml 32.07 ml/m  AORTIC VALVE LVOT Vmax:   81.60 cm/s LVOT Vmean:  60.000 cm/s LVOT VTI:    0.212 m  AORTA Ao Root diam: 3.40 cm MITRAL VALVE MV Area (PHT): 2.20 cm              SHUNTS MV PHT:        100.05 msec  Systemic VTI:  0.21 m MV Decel Time: 345 msec              Systemic Diam: 2.00 cm MV E velocity: 86.10 cm/s  103 cm/s MV A velocity: 101.00 cm/s 70.3 cm/s MV E/A ratio:  0.85        1.5  Kate Sable MD Electronically signed by Kate Sable MD Signature Date/Time: 01/22/2019/11:01:07 AM    Final        Subjective: No new complaints.   Discharge Exam: Vitals:   01/25/19 0929 01/25/19 1511  BP: (!) 143/62 140/77  Pulse:  62  Resp:    Temp:  98.4 F (36.9 C)  SpO2:  95%   Vitals:   01/24/19 2015 01/25/19 0400 01/25/19 0929 01/25/19 1511  BP: (!) 142/59 (!) 107/47 (!) 143/62 140/77  Pulse: 61 (!) 56  62   Resp: 18 19    Temp: 97.8 F (36.6 C) 98.3 F (36.8 C)  98.4 F (36.9 C)  TempSrc: Oral Oral  Oral  SpO2: 96% 95%  95%  Weight:  79.1 kg    Height:        General: Pt is alert, awake, not in acute distress Cardiovascular: RRR, S1/S2 +, no rubs, no gallops Respiratory: CTA bilaterally, no wheezing, no rhonchi Abdominal: Soft, NT, ND, bowel sounds + Extremities: no edema, no cyanosis    The results of significant diagnostics from this hospitalization (including imaging, microbiology, ancillary and laboratory) are listed below for reference.     Microbiology: Recent Results (from the past 240 hour(s))  SARS CORONAVIRUS 2 (TAT 6-24 HRS) Nasopharyngeal Nasopharyngeal Swab     Status: Abnormal   Collection Time: 01/21/19 10:00 PM   Specimen: Nasopharyngeal Swab  Result Value Ref Range Status   SARS Coronavirus 2 POSITIVE (A) NEGATIVE Final    Comment: RESULT CALLED TO, READ BACK BY AND VERIFIED WITH: RN Bud Face AT [id number] 01/22/2019 BY L BENFIELD (NOTE) SARS-CoV-2 target nucleic acids are DETECTED. The SARS-CoV-2 RNA is generally detectable in upper and lower respiratory specimens during the acute phase of infection. Positive results are indicative of the presence of SARS-CoV-2 RNA. Clinical correlation with patient history and other diagnostic information is  necessary to determine patient infection status. Positive results do not rule out bacterial infection or co-infection with other viruses.  The expected result is Negative. Fact Sheet for Patients: SugarRoll.be Fact Sheet for Healthcare Providers: https://www.woods-mathews.com/ This test is not yet approved or cleared by the [place] FDA and  has been authorized for detection and/or diagnosis of SARS-CoV-2 by FDA under an Emergency Use Authorization (EUA). This EUA will remain  in effect (meaning this test can be used)  for the duration of the COVID-19 declaration under  Section 564(b)(1) of the Act, 21 U.S.C. section 360bbb-3(b)(1), unless the authorization is terminated or revoked sooner. Performed at [facility], [place] [address], [place], [place] [address]   MRSA PCR Screening     Status: None   Collection Time: 01/24/19  6:09 PM   Specimen: Nasal Mucosa; Nasopharyngeal  Result Value Ref Range Status   MRSA by PCR NEGATIVE NEGATIVE Final    Comment:        The GeneXpert MRSA Assay (FDA approved for NASAL specimens only), is one component of a comprehensive MRSA colonization surveillance program. It is not intended to diagnose MRSA infection nor to guide or monitor treatment for MRSA infections. Performed at [facility], [place] [address], [place], [place] [address]  Labs: BNP (last 3 results) No results for input(s): BNP in the last 8760 hours. Basic Metabolic Panel: Recent Labs  Lab 01/21/19 1933 01/22/19 0159 01/23/19 0535 01/24/19 0343 01/25/19 0420  NA 133* 139 141 142 137  K 4.9 4.3 4.1 4.2 4.6  CL 99 104 107 107 101  CO2 22 26 25 28 26   GLUCOSE 451* 285* 163* 56* 130*  BUN 22 23 28* 28* 29*  CREATININE 1.54* 1.36* 1.24 1.27* 1.59*  CALCIUM 8.9 8.6* 8.6* 9.1 9.3  MG  --   --  2.0 2.0 1.9  PHOS  --   --  3.4  --   --    Liver Function Tests: Recent Labs  Lab 01/23/19 0535 01/24/19 0343 01/25/19 0420  AST 20 19 21   ALT 13 13 16   ALKPHOS 62 65 71  BILITOT 0.5 0.6 0.7  PROT 5.7* 5.7* 5.9*  ALBUMIN 3.1* 3.1* 3.2*   No results for input(s): LIPASE, AMYLASE in the last 168 hours. No results for input(s): AMMONIA in the last 168 hours. CBC: Recent Labs  Lab 01/21/19 1933 01/22/19 0159 01/23/19 0535 01/24/19 0343  WBC 5.9 9.1 9.3 9.2  HGB 11.4* 10.5* 10.3* 10.7*  HCT 37.1* 33.0* 32.6* 32.9*  MCV 98.7 94.6 95.3 94.0  PLT 222 200 166 180   Cardiac Enzymes: No results for input(s): CKTOTAL, CKMB, CKMBINDEX, TROPONINI in the last 168 hours. BNP: Invalid input(s): POCBNP CBG: Recent Labs  Lab  01/24/19 1929 01/25/19 0007 01/25/19 0356 01/25/19 0743 01/25/19 1222  GLUCAP 176* 118* 124* 102* 144*   D-Dimer No results for input(s): DDIMER in the last 72 hours. Hgb A1c No results for input(s): HGBA1C in the last 72 hours. Lipid Profile No results for input(s): CHOL, HDL, LDLCALC, TRIG, CHOLHDL, LDLDIRECT in the last 72 hours. Thyroid function studies No results for input(s): TSH, T4TOTAL, T3FREE, THYROIDAB in the last 72 hours.  Invalid input(s): FREET3 Anemia work up No results for input(s): VITAMINB12, FOLATE, FERRITIN, TIBC, IRON, RETICCTPCT in the last 72 hours. Urinalysis    Component Value Date/Time   COLORURINE YELLOW 12/27/2018 0205   APPEARANCEUR CLEAR 12/27/2018 0205   LABSPEC 1.016 12/27/2018 0205   PHURINE 6.0 12/27/2018 0205   GLUCOSEU 50 (A) 12/27/2018 0205   HGBUR NEGATIVE 12/27/2018 0205   BILIRUBINUR NEGATIVE 12/27/2018 0205   KETONESUR NEGATIVE 12/27/2018 0205   PROTEINUR 30 (A) 12/27/2018 0205   UROBILINOGEN 1.0 12/09/2012 1651   NITRITE NEGATIVE 12/27/2018 0205   LEUKOCYTESUR NEGATIVE 12/27/2018 0205   Sepsis Labs Invalid input(s): PROCALCITONIN,  WBC,  LACTICIDVEN Microbiology Recent Results (from the past 240 hour(s))  SARS CORONAVIRUS 2 (TAT 6-24 HRS) Nasopharyngeal Nasopharyngeal Swab     Status: Abnormal   Collection Time: 01/21/19 10:00 PM   Specimen: Nasopharyngeal Swab  Result Value Ref Range Status   SARS Coronavirus 2 POSITIVE (A) NEGATIVE Final    Comment: RESULT CALLED TO, READ BACK BY AND VERIFIED WITH: RN Bud Face AT [id number] 01/22/2019 BY L BENFIELD (NOTE) SARS-CoV-2 target nucleic acids are DETECTED. The SARS-CoV-2 RNA is generally detectable in upper and lower respiratory specimens during the acute phase of infection. Positive results are indicative of the presence of SARS-CoV-2 RNA. Clinical correlation with patient history and other diagnostic information is  necessary to determine patient infection status. Positive results  do not rule out bacterial infection or co-infection with other viruses.  The expected result is Negative. Fact Sheet for Patients: SugarRoll.be Fact Sheet for Healthcare Providers: https://www.woods-mathews.com/ This test is  not yet approved or cleared by the [place] and  has been authorized for detection and/or diagnosis of SARS-CoV-2 by FDA under an Emergency Use Authorization (EUA). This EUA will remain  in effect (meaning this test can be used)  for the duration of the COVID-19 declaration under Section 564(b)(1) of the Act, 21 U.S.C. section 360bbb-3(b)(1), unless the authorization is terminated or revoked sooner. Performed at [facility], [place] [address] [place], [place] [address]   MRSA PCR Screening     Status: None   Collection Time: 01/24/19  6:09 PM   Specimen: Nasal Mucosa; Nasopharyngeal  Result Value Ref Range Status   MRSA by PCR NEGATIVE NEGATIVE Final    Comment:        The GeneXpert MRSA Assay (FDA approved for NASAL specimens only), is one component of a comprehensive MRSA colonization surveillance program. It is not intended to diagnose MRSA infection nor to guide or monitor treatment for MRSA infections. Performed at [facility], [place] [address], [place], [place] [address]      Time coordinating discharge: 34 minutes  SIGNED:   Hosie Poisson, MD  [facility]
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
## 2019-01-25 NOTE — Care Management Important Message (Signed)
Important Message  Patient Details  Name: Casey Collins MRN: [id number] Date of Birth: 06-02-1935   Medicare Important Message Given:  Yes     Shelda Altes 01/25/2019, 1:07 PM

## 2019-01-25 NOTE — TOC Transition Note (Signed)
Transition of Care [facility]) - CM/SW Discharge Note   Patient Details  Name: Biff Charon MRN: [id number] Date of Birth: Aug 24, 1935  Transition of Care [facility]) CM/SW Contact:  Eileen Stanford, LCSW Phone Number: 01/25/2019, 4:21 PM   Clinical Narrative:   Clinical Social Worker facilitated patient discharge including contacting patient family and facility to confirm patient discharge plans.  Clinical information faxed to facility and family agreeable with plan.  CSW arranged ambulance transport via [facility] to [facility].  RN to call [phone] for report prior to discharge.     Final next level of care: Assisted Living Barriers to Discharge: No Barriers Identified   Patient Goals and CMS Choice Patient states their goals for this hospitalization and ongoing recovery are:: "to get rehab at [facility]"      Discharge Placement              Patient chooses bed at: [facility] Patient to be transferred to facility by: [place] Name of family member notified: Tim Patient and family notified of of transfer: 01/25/19  Discharge Plan and Services In-house Referral: Clinical Social Work   Post Acute Care Choice: (memory care)                               Social Determinants of Health (SDOH) Interventions     Readmission Risk Interventions No flowsheet data found.
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
## 2019-01-26 LAB — GLUCOSE, CAPILLARY
Glucose-Capillary: 115 mg/dL — ABNORMAL HIGH (ref 70–99)
Glucose-Capillary: 122 mg/dL — ABNORMAL HIGH (ref 70–99)
Glucose-Capillary: 185 mg/dL — ABNORMAL HIGH (ref 70–99)
Glucose-Capillary: 149 mg/dL — ABNORMAL HIGH (ref 70–99)
Glucose-Capillary: 163 mg/dL — ABNORMAL HIGH (ref 70–99)

## 2019-01-26 LAB — COMPREHENSIVE METABOLIC PANEL
ALT: 17 U/L (ref 0–44)
AST: 19 U/L (ref 15–41)
Albumin: 3.4 g/dL — ABNORMAL LOW (ref 3.5–5.0)
Alkaline Phosphatase: 75 U/L (ref 38–126)
Anion gap: 9 (ref 5–15)
BUN: 33 mg/dL — ABNORMAL HIGH (ref 8–23)
CO2: 26 mmol/L (ref 22–32)
Calcium: 9.3 mg/dL (ref 8.9–10.3)
Chloride: 103 mmol/L (ref 98–111)
Creatinine, Ser: 1.49 mg/dL — ABNORMAL HIGH (ref 0.61–1.24)
GFR calc Af Amer: 50 mL/min — ABNORMAL LOW (ref >60)
GFR calc non Af Amer: 43 mL/min — ABNORMAL LOW (ref >60)
Glucose, Bld: 122 mg/dL — ABNORMAL HIGH (ref 70–99)
Potassium: 4.5 mmol/L (ref 3.5–5.1)
Sodium: 138 mmol/L (ref 135–145)
Total Bilirubin: 0.8 mg/dL (ref 0.3–1.2)
Total Protein: 6.2 g/dL — ABNORMAL LOW (ref 6.5–8.1)

## 2019-01-26 LAB — MAGNESIUM: Magnesium: 1.8 mg/dL (ref 1.7–2.4)

## 2019-01-26 NOTE — Discharge Summary (Signed)
Physician Discharge Summary  Casey Collins [id number] DOB: [birth year] DOA: 01/21/2019  PCP: Burnard Bunting, MD  Admit date: 01/21/2019 Discharge date: 01/26/2019  Admitted From: [facility].  Disposition: SNF   Recommendations for Outpatient Follow-up:  1. Follow up with PCP in 1-2 weeks 2. Please obtain BMP/CBC in one week 3. Please follow up WITH CARDIOLOGY in one week    Discharge Condition:stable.  CODE STATUS DNR  Diet recommendation: Heart Healthy   Brief/Interim Summary: Casey Collins Esperanza Casey Collins a 83 y.o.malewith medical history significant ofdementia, prior stroke with chronic right sided weakness and aphasia, DM2, Status post Covid illness early November presented to ED with c/o CP.Just prior to arrival was laying down developed dull pain in the middle of his chest. Pain was constant and only improved after nitroglycerin and aspirin were given by EMS.ED Course:EKG showed old inferior wall infarct, unchanged from priors. CXR showed bibasilar actelectasis. First trop was 17, second was 48. Creat 1.5 at baseline.Patient was admitted to hospital for further evaluation and treatment.  Discharge Diagnoses:  Principal Problem:   Chest pain Active Problems:   Diabetes mellitus type 2 in nonobese (HCC)   Benign essential HTN   Aphasia as late effect of stroke   History of 2019 novel coronavirus disease (COVID-19)   CKD (chronic kidney disease) stage 3, GFR 30-59 ml/min   Accelerated hypertension   Sinus bradycardia   1 chest pain with elevated troponin/unstable angina Patient presented with chest pain with mildly elevated troponins.  2D echo done with a EF of 50%, left ventricular septal wall thickness mildly increased, mild Lee increased LVH, grade 1 diastolic dysfunction, left ventricle demonstrates global hypokinesis.  Patient seen by cardiology initial concern was chest pain secondary to uncontrolled blood pressure.  Patient was on IV heparin x48 hours which  has subsequently been discontinued by cardiology .  Patient seen by cardiology who feels patient likely had a myocardial injury in the setting of accelerated hypertension.  Cardiology recommended conservative medical management.  Patient initially was placed on Coreg which has been discontinued due to persistent bradycardia.  Continue low-dose isosorbide, aspirin, statin, Plavix, Norvasc.  Cardiology recommended noninvasive therapy.  Outpatient follow-up.  2.  Accelerated hypertension Coreg discontinued due to persistent bradycardia.  Patient started on Norvasc which we will continue for now.  Continue Imdur, lisinopril.  Outpatient follow-up.  3.  Diabetes mellitus type 2 Hemoglobin A1c 7.1 on 01/21/2019.  Patient noted to have a blood glucose level of 61 this morning with patient being asymptomatic.  Will decrease Lantus to 5 units twice daily.  Continue sliding scale insulin.  4.  Sinus bradycardia Coreg discontinued.  Follow.  5.  History of ischemic CVA with right-sided residual weakness and aphasia Currently stable.  Continue aspirin and Plavix for secondary stroke prophylaxis.  Continue statin.  Outpatient follow-up.  6.  History of COVID-19 infection in early November Currently asymptomatic.  Patient is not hypoxic.  No need for airborne or contact isolation at this time.   Discharge Instructions   Allergies as of 01/26/2019   No Known Allergies     Medication List    STOP taking these medications   Basaglar KwikPen 100 UNIT/ML Sopn Replaced by: insulin glargine 100 UNIT/ML injection   HYDROcodone-acetaminophen 5-325 MG tablet Commonly known as: Norco   ramipril 5 MG capsule Commonly known as: ALTACE   simvastatin 40 MG tablet Commonly known as: ZOCOR   tizanidine 2 MG capsule Commonly known as: ZANAFLEX     TAKE these medications  amLODipine 5 MG tablet Commonly known as: NORVASC Take 1 tablet (5 mg total) by mouth daily.   aspirin EC 81 MG tablet Take  81 mg by mouth daily.   atorvastatin 40 MG tablet Commonly known as: LIPITOR Take 1 tablet (40 mg total) by mouth daily at 6 PM.   citalopram 10 MG tablet Commonly known as: CELEXA Take 1 tablet (10 mg total) by mouth daily.   clopidogrel 75 MG tablet Commonly known as: PLAVIX Take 1 tablet (75 mg total) by mouth daily.   donepezil 5 MG tablet Commonly known as: ARICEPT Take 5 mg by mouth at bedtime.   gabapentin 300 MG capsule Commonly known as: NEURONTIN Take 300 mg by mouth 3 (three) times daily.   insulin glargine 100 UNIT/ML injection Commonly known as: LANTUS Inject 0.05 mLs (5 Units total) into the skin 2 (two) times daily. Replaces: Basaglar KwikPen 100 UNIT/ML Sopn   insulin regular 100 units/mL injection Commonly known as: NOVOLIN R Inject 5 Units into the skin every morning.   isosorbide mononitrate 30 MG 24 hr tablet Commonly known as: IMDUR Take 1 tablet (30 mg total) by mouth daily.   lisinopril 10 MG tablet Commonly known as: ZESTRIL TAKE 1 TABLET BY MOUTH DAILY   LORazepam 0.5 MG tablet Commonly known as: ATIVAN Take 1 tablet (0.5 mg total) by mouth 3 (three) times daily as needed for anxiety.   Melatonin 3 MG Tabs Take 1 tablet by mouth daily as needed (For sleep).   multivitamin tablet Take 1 tablet by mouth daily.   ondansetron 4 MG disintegrating tablet Commonly known as: Zofran ODT 4mg  ODT q4 hours prn nausea/vomit What changed:   how much to take  how to take this  when to take this  reasons to take this  additional instructions   vitamin C 1000 MG tablet Take 1 tablet (1,000 mg total) by mouth daily.      Follow-up Information    Burnard Bunting, MD. Schedule an appointment as soon as possible for a visit in 1 week(s).   Specialty: Internal Medicine Contact information: [address] [place] [address] [phone]          No Known Allergies  Consultations:  Cardiology.    Procedures/Studies: DG  Chest 2 View  Result Date: 01/21/2019 CLINICAL DATA:  Chest pain EXAM: CHEST - 2 VIEW COMPARISON:  December 27, 2018 FINDINGS: There is mild cardiomegaly. Tortuous ascending aorta. Aortic knob calcifications. Mildly increased interstitial markings seen at the left lung base. No large airspace consolidation or pleural effusion. The visualized skeletal structures are unremarkable. IMPRESSION: Nonspecific mildly increased interstitial markings at the left lung base which could be due to atelectasis and/or early infectious etiology Electronically Signed   By: Prudencio Pair M.D.   On: 01/21/2019 20:05   ECHOCARDIOGRAM COMPLETE  Result Date: 01/22/2019   ECHOCARDIOGRAM REPORT   Patient Name:   Casey Collins [facility] Date of Exam: 01/22/2019 Medical Rec #:  [id number]             Height:       70.5 in Accession #:    [id number]            Weight:       182.3 lb Date of Birth:  03-Oct-1935             BSA:          2.02 m Patient Age:    44 years  BP:           152/66 mmHg Patient Gender: M                     HR:           59 bpm. Exam Location:  Inpatient Procedure: 2D Echo, Color Doppler and Cardiac Doppler Indications:    R07.9* Chest pain, unspecified  History:        Patient has prior history of Echocardiogram examinations, most                 recent 12/02/2012. Risk Factors:Hypertension, Diabetes and                 Dyslipidemia. COVID+ 12/01/18.  Sonographer:    Raquel Sarna Senior RDCS Referring Phys: [place]  1. Left ventricular ejection fraction, by visual estimation, is 50%. The left ventricle has low normal to mildly reduced function. Left ventricular septal wall thickness was mildly increased. There is mildly increased left ventricular hypertrophy.  2. Elevated left ventricular end-diastolic pressure.  3. Left ventricular diastolic parameters are consistent with Grade I diastolic dysfunction (impaired relaxation).  4. The left ventricle demonstrates global hypokinesis.  5.  Global right ventricle has normal systolic function.The right ventricular size is normal. No increase in right ventricular wall thickness.  6. Left atrial size was normal.  7. Right atrial size was normal.  8. Mild mitral annular calcification.  9. The mitral valve is degenerative. Mild mitral valve regurgitation. 10. The tricuspid valve is grossly normal. 11. The aortic valve is tricuspid. Aortic valve regurgitation is mild. Mild aortic valve sclerosis without stenosis. 12. The pulmonic valve was grossly normal. Pulmonic valve regurgitation is not visualized. 13. The inferior vena cava is dilated in size with <50% respiratory variability, suggesting right atrial pressure of 15 mmHg. FINDINGS  Left Ventricle: Left ventricular ejection fraction, by visual estimation, is 50%. The left ventricle has low normal function. The left ventricle demonstrates global hypokinesis. The left ventricular internal cavity size was the left ventricle is normal in size. There is mildly increased left ventricular hypertrophy. Concentric left ventricular hypertrophy. Left ventricular diastolic parameters are consistent with Grade I diastolic dysfunction (impaired relaxation). Elevated left ventricular end-diastolic pressure. Right Ventricle: The right ventricular size is normal. No increase in right ventricular wall thickness. Global RV systolic function is has normal systolic function. Left Atrium: Left atrial size was normal in size. Right Atrium: Right atrial size was normal in size Pericardium: There is no evidence of pericardial effusion. Mitral Valve: The mitral valve is degenerative in appearance. There is mild thickening of the mitral valve leaflet(s). Mild mitral annular calcification. Mild mitral valve regurgitation. Tricuspid Valve: The tricuspid valve is grossly normal. Tricuspid valve regurgitation is not demonstrated. Aortic Valve: The aortic valve is tricuspid. . There is mild thickening of the aortic valve. Aortic valve  regurgitation is mild. Mild aortic valve sclerosis is present, with no evidence of aortic valve stenosis. There is mild thickening of the aortic valve. Pulmonic Valve: The pulmonic valve was grossly normal. Pulmonic valve regurgitation is not visualized. Pulmonic regurgitation is not visualized. Aorta: The aortic root is normal in size and structure. Venous: The inferior vena cava is dilated in size with less than 50% respiratory variability, suggesting right atrial pressure of 15 mmHg. IAS/Shunts: No atrial level shunt detected by color flow Doppler.  LEFT VENTRICLE PLAX 2D LVIDd:         4.90 cm  Diastology LVIDs:         3.10 cm       LV e' lateral:   6.53 cm/s LV PW:         1.10 cm       LV E/e' lateral: 13.2 LV IVS:        1.20 cm       LV e' medial:    4.46 cm/s LVOT diam:     2.00 cm       LV E/e' medial:  19.3 LV SV:         75 ml LV SV Index:   36.74 LVOT Area:     3.14 cm  LV Volumes (MOD) LV area d, A2C:    34.70 cm LV area d, A4C:    35.00 cm LV area s, A2C:    21.90 cm LV area s, A4C:    23.90 cm LV major d, A2C:   8.91 cm LV major d, A4C:   9.25 cm LV major s, A2C:   7.34 cm LV major s, A4C:   7.86 cm LV vol d, MOD A2C: 112.0 ml LV vol d, MOD A4C: 107.0 ml LV vol s, MOD A2C: 54.1 ml LV vol s, MOD A4C: 59.8 ml LV SV MOD A2C:     57.9 ml LV SV MOD A4C:     107.0 ml LV SV MOD BP:      51.9 ml LEFT ATRIUM           Index       RIGHT ATRIUM           Index LA diam:      3.70 cm 1.83 cm/m  RA Area:     17.20 cm LA Vol (A2C): 62.9 ml 31.18 ml/m RA Volume:   44.00 ml  21.81 ml/m LA Vol (A4C): 64.7 ml 32.07 ml/m  AORTIC VALVE LVOT Vmax:   81.60 cm/s LVOT Vmean:  60.000 cm/s LVOT VTI:    0.212 m  AORTA Ao Root diam: 3.40 cm MITRAL VALVE MV Area (PHT): 2.20 cm              SHUNTS MV PHT:        100.05 msec           Systemic VTI:  0.21 m MV Decel Time: 345 msec              Systemic Diam: 2.00 cm MV E velocity: 86.10 cm/s  103 cm/s MV A velocity: 101.00 cm/s 70.3 cm/s MV E/A ratio:  0.85         1.5  Casey Sable MD Electronically signed by Casey Sable MD Signature Date/Time: 01/22/2019/11:01:07 AM    Final       Subjective: No new complaints.   Discharge Exam: Vitals:   01/26/19 0335 01/26/19 0800  BP: 131/75   Pulse: 69   Resp: 16 18  Temp: 98.3 F (36.8 C)   SpO2: 97%    Vitals:   01/25/19 2002 01/26/19 0335 01/26/19 0337 01/26/19 0800  BP: 139/66 131/75    Pulse: 70 69    Resp: 15 16  18   Temp: 98 F (36.7 C) 98.3 F (36.8 C)    TempSrc: Oral Oral    SpO2: 98% 97%    Weight:   79.2 kg   Height:        General: Pt is alert, awake, not in acute distress Cardiovascular: RRR, S1/S2 +, no rubs, no gallops Respiratory:  CTA bilaterally, no wheezing, no rhonchi Abdominal: Soft, NT, ND, bowel sounds + Extremities: no edema, no cyanosis    The results of significant diagnostics from this hospitalization (including imaging, microbiology, ancillary and laboratory) are listed below for reference.     Microbiology: Recent Results (from the past 240 hour(s))  SARS CORONAVIRUS 2 (TAT 6-24 HRS) Nasopharyngeal Nasopharyngeal Swab     Status: Abnormal   Collection Time: 01/21/19 10:00 PM   Specimen: Nasopharyngeal Swab  Result Value Ref Range Status   SARS Coronavirus 2 POSITIVE (A) NEGATIVE Final    Comment: RESULT CALLED TO, READ BACK BY AND VERIFIED WITH: RN Bud Face AT [id number] 01/22/2019 BY L BENFIELD (NOTE) SARS-CoV-2 target nucleic acids are DETECTED. The SARS-CoV-2 RNA is generally detectable in upper and lower respiratory specimens during the acute phase of infection. Positive results are indicative of the presence of SARS-CoV-2 RNA. Clinical correlation with patient history and other diagnostic information is  necessary to determine patient infection status. Positive results do not rule out bacterial infection or co-infection with other viruses.  The expected result is Negative. Fact Sheet for  Patients: SugarRoll.be Fact Sheet for Healthcare Providers: https://www.woods-mathews.com/ This test is not yet approved or cleared by the [place] FDA and  has been authorized for detection and/or diagnosis of SARS-CoV-2 by FDA under an Emergency Use Authorization (EUA). This EUA will remain  in effect (meaning this test can be used)  for the duration of the COVID-19 declaration under Section 564(b)(1) of the Act, 21 U.S.C. section 360bbb-3(b)(1), unless the authorization is terminated or revoked sooner. Performed at [facility], [place] [address] [place], [place] [address]   MRSA PCR Screening     Status: None   Collection Time: 01/24/19  6:09 PM   Specimen: Nasal Mucosa; Nasopharyngeal  Result Value Ref Range Status   MRSA by PCR NEGATIVE NEGATIVE Final    Comment:        The GeneXpert MRSA Assay (FDA approved for NASAL specimens only), is one component of a comprehensive MRSA colonization surveillance program. It is not intended to diagnose MRSA infection nor to guide or monitor treatment for MRSA infections. Performed at [facility], [place] [address], [place], [place] [address]      Labs: BNP (last 3 results) No results for input(s): BNP in the last 8760 hours. Basic Metabolic Panel: Recent Labs  Lab 01/22/19 0159 01/23/19 0535 01/24/19 0343 01/25/19 0420 01/26/19 0412  NA 139 141 142 137 138  K 4.3 4.1 4.2 4.6 4.5  CL 104 107 107 101 103  CO2 26 25 28 26 26   GLUCOSE 285* 163* 56* 130* 122*  BUN 23 28* 28* 29* 33*  CREATININE 1.36* 1.24 1.27* 1.59* 1.49*  CALCIUM 8.6* 8.6* 9.1 9.3 9.3  MG  --  2.0 2.0 1.9 1.8  PHOS  --  3.4  --   --   --    Liver Function Tests: Recent Labs  Lab 01/23/19 0535 01/24/19 0343 01/25/19 0420 01/26/19 0412  AST 20 19 21 19   ALT 13 13 16 17   ALKPHOS 62 65 71 75  BILITOT 0.5 0.6 0.7 0.8  PROT 5.7* 5.7* 5.9* 6.2*  ALBUMIN 3.1* 3.1* 3.2* 3.4*   No results for  input(s): LIPASE, AMYLASE in the last 168 hours. No results for input(s): AMMONIA in the last 168 hours. CBC: Recent Labs  Lab 01/21/19 1933 01/22/19 0159 01/23/19 0535 01/24/19 0343  WBC 5.9 9.1 9.3 9.2  HGB 11.4*  10.5* 10.3* 10.7*  HCT 37.1* 33.0* 32.6* 32.9*  MCV 98.7 94.6 95.3 94.0  PLT 222 200 166 180   Cardiac Enzymes: No results for input(s): CKTOTAL, CKMB, CKMBINDEX, TROPONINI in the last 168 hours. BNP: Invalid input(s): POCBNP CBG: Recent Labs  Lab 01/25/19 1957 01/25/19 2321 01/26/19 0332 01/26/19 0802 01/26/19 1129  GLUCAP 213* 143* 115* 122* 185*   D-Dimer No results for input(s): DDIMER in the last 72 hours. Hgb A1c No results for input(s): HGBA1C in the last 72 hours. Lipid Profile No results for input(s): CHOL, HDL, LDLCALC, TRIG, CHOLHDL, LDLDIRECT in the last 72 hours. Thyroid function studies No results for input(s): TSH, T4TOTAL, T3FREE, THYROIDAB in the last 72 hours.  Invalid input(s): FREET3 Anemia work up No results for input(s): VITAMINB12, FOLATE, FERRITIN, TIBC, IRON, RETICCTPCT in the last 72 hours. Urinalysis    Component Value Date/Time   COLORURINE YELLOW 12/27/2018 0205   APPEARANCEUR CLEAR 12/27/2018 0205   LABSPEC 1.016 12/27/2018 0205   PHURINE 6.0 12/27/2018 0205   GLUCOSEU 50 (A) 12/27/2018 0205   HGBUR NEGATIVE 12/27/2018 0205   BILIRUBINUR NEGATIVE 12/27/2018 0205   KETONESUR NEGATIVE 12/27/2018 0205   PROTEINUR 30 (A) 12/27/2018 0205   UROBILINOGEN 1.0 12/09/2012 1651   NITRITE NEGATIVE 12/27/2018 0205   LEUKOCYTESUR NEGATIVE 12/27/2018 0205   Sepsis Labs Invalid input(s): PROCALCITONIN,  WBC,  LACTICIDVEN Microbiology Recent Results (from the past 240 hour(s))  SARS CORONAVIRUS 2 (TAT 6-24 HRS) Nasopharyngeal Nasopharyngeal Swab     Status: Abnormal   Collection Time: 01/21/19 10:00 PM   Specimen: Nasopharyngeal Swab  Result Value Ref Range Status   SARS Coronavirus 2 POSITIVE (A) NEGATIVE Final    Comment:  RESULT CALLED TO, READ BACK BY AND VERIFIED WITH: RN Bud Face AT [id number] 01/22/2019 BY L BENFIELD (NOTE) SARS-CoV-2 target nucleic acids are DETECTED. The SARS-CoV-2 RNA is generally detectable in upper and lower respiratory specimens during the acute phase of infection. Positive results are indicative of the presence of SARS-CoV-2 RNA. Clinical correlation with patient history and other diagnostic information is  necessary to determine patient infection status. Positive results do not rule out bacterial infection or co-infection with other viruses.  The expected result is Negative. Fact Sheet for Patients: SugarRoll.be Fact Sheet for Healthcare Providers: https://www.woods-mathews.com/ This test is not yet approved or cleared by the [place] FDA and  has been authorized for detection and/or diagnosis of SARS-CoV-2 by FDA under an Emergency Use Authorization (EUA). This EUA will remain  in effect (meaning this test can be used)  for the duration of the COVID-19 declaration under Section 564(b)(1) of the Act, 21 U.S.C. section 360bbb-3(b)(1), unless the authorization is terminated or revoked sooner. Performed at [facility], [place] [address] [place], [place] [address]   MRSA PCR Screening     Status: None   Collection Time: 01/24/19  6:09 PM   Specimen: Nasal Mucosa; Nasopharyngeal  Result Value Ref Range Status   MRSA by PCR NEGATIVE NEGATIVE Final    Comment:        The GeneXpert MRSA Assay (FDA approved for NASAL specimens only), is one component of a comprehensive MRSA colonization surveillance program. It is not intended to diagnose MRSA infection nor to guide or monitor treatment for MRSA infections. Performed at [facility], [place] [address] [place], [place] [address]     01/26/2019: ADDENDUM TO THE DISCHARGE SUMMARY DONE BY DR Jeoffrey Massed AKULA:  No changes overnight. Patient has remained stable. Patient  will be  discharged as documented by Dr. Hosie Poisson. Follow up with Cardiology on discharge.   SIGNED:   Bonnell Public, MD  [facility]

## 2019-01-26 NOTE — Progress Notes (Signed)
Physical Therapy Treatment Patient Details Name: Casey Collins MRN: [id number] DOB: 1935-04-25 Today's Date: 01/26/2019    History of Present Illness 83 y.o. male with elevated troponin in the setting of accelerated hypertension, history of stroke dementia hypertension hyperlipidemia.Covid in November.    PT Comments    Pt was able to demonstrate improved transfer and transferred to chair with min A.  Good participation with sit to stand practice but needs increased time and cues for technique.  Cont POC.    Follow Up Recommendations  SNF;Supervision/Assistance - 24 hour     Equipment Recommendations  None recommended by PT    Recommendations for Other Services       Precautions / Restrictions Precautions Precautions: Fall    Mobility  Bed Mobility Overal bed mobility: Needs Assistance Bed Mobility: Supine to Sit     Supine to sit: Min assist     General bed mobility comments: A little assist for trunk elevation; increased time  Transfers Overall transfer level: Needs assistance Equipment used: Rolling walker (2 wheeled) Transfers: Sit to/from Stand Sit to Stand: Min guard         General transfer comment: Performed sit to stand x 7: cued for safe hand placemetn with standing and sitting; cued not to "plop" down  Ambulation/Gait Ambulation/Gait assistance: Min assist Gait Distance (Feet): 2 Feet Assistive device: Rolling walker (2 wheeled) Gait Pattern/deviations: Decreased stride length;Shuffle;Decreased weight shift to left Gait velocity: decreased   General Gait Details: steps to chair  with assist   Stairs             Wheelchair Mobility    Modified Rankin (Stroke Patients Only)       Balance Overall balance assessment: Needs assistance Sitting-balance support: No upper extremity supported;Feet supported Sitting balance-Leahy Scale: Fair     Standing balance support: Bilateral upper extremity supported;During functional  activity Standing balance-Leahy Scale: Poor Standing balance comment: relies on UE support                            Cognition Arousal/Alertness: Awake/alert Behavior During Therapy: WFL for tasks assessed/performed Overall Cognitive Status: History of cognitive impairments - at baseline Area of Impairment: Orientation                 Orientation Level: Place;Time;Situation             General Comments: slow to respond      Exercises      General Comments General comments (skin integrity, edema, etc.): vss      Pertinent Vitals/Pain Pain Assessment: No/denies pain    Home Living                      Prior Function            PT Goals (current goals can now be found in the care plan section) Progress towards PT goals: Progressing toward goals    Frequency    Min 2X/week      PT Plan Current plan remains appropriate    Co-evaluation              AM-PAC PT "6 Clicks" Mobility   Outcome Measure  Help needed turning from your back to your side while in a flat bed without using bedrails?: A Little Help needed moving from lying on your back to sitting on the side of a flat bed without using bedrails?: A Little Help needed  moving to and from a bed to a chair (including a wheelchair)?: A Little Help needed standing up from a chair using your arms (e.g., wheelchair or bedside chair)?: A Little Help needed to walk in hospital room?: A Lot Help needed climbing 3-5 steps with a railing? : Total 6 Click Score: 15    End of Session Equipment Utilized During Treatment: Gait belt Activity Tolerance: Patient tolerated treatment well Patient left: with call bell/phone within reach;in chair;with chair alarm set Nurse Communication: Mobility status PT Visit Diagnosis: Unsteadiness on feet (R26.81);Muscle weakness (generalized) (M62.81)     Time: 1036-1100 PT Time Calculation (min) (ACUTE ONLY): 24 min  Charges:  $Therapeutic  Activity: 23-37 mins                     Maggie Font, PT Acute Rehab Services Pager [phone] [facility] [phone] [facility] [phone]    Karlton Lemon 01/26/2019, 11:24 AM

## 2019-01-26 NOTE — Progress Notes (Signed)
Report called to Lurline Del, RN for transfer to [facility]. D/C pending transport by [facility].
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
## 2019-01-27 DIAGNOSIS — F0280 Dementia in other diseases classified elsewhere without behavioral disturbance: Secondary | ICD-10-CM | POA: Diagnosis not present

## 2019-01-27 DIAGNOSIS — I69351 Hemiplegia and hemiparesis following cerebral infarction affecting right dominant side: Secondary | ICD-10-CM | POA: Diagnosis not present

## 2019-01-27 DIAGNOSIS — E1122 Type 2 diabetes mellitus with diabetic chronic kidney disease: Secondary | ICD-10-CM | POA: Diagnosis not present

## 2019-01-27 DIAGNOSIS — Z794 Long term (current) use of insulin: Secondary | ICD-10-CM | POA: Diagnosis not present

## 2019-01-27 DIAGNOSIS — G309 Alzheimer's disease, unspecified: Secondary | ICD-10-CM | POA: Diagnosis not present

## 2019-01-27 DIAGNOSIS — M1611 Unilateral primary osteoarthritis, right hip: Secondary | ICD-10-CM | POA: Diagnosis not present

## 2019-01-27 NOTE — TOC Transition Note (Signed)
Transition of Care [facility]) - CM/SW Discharge Note   Patient Details  Name: Trevion Baser MRN: [id number] Date of Birth: 21-Feb-1935  Transition of Care [facility] CM/SW Contact:  Eileen Stanford, LCSW Phone Number: 01/27/2019, 8:40 AM   Clinical Narrative:   Clinical Social Worker facilitated patient discharge including contacting patient family and facility to confirm patient discharge plans.  Clinical information faxed to facility and family agreeable with plan.  CSW arranged ambulance transport via [facility] to [facility]  RN to call for report prior to discharge.     Final next level of care: Assisted Living Barriers to Discharge: No Barriers Identified   Patient Goals and CMS Choice Patient states their goals for this hospitalization and ongoing recovery are:: "to get rehab at [facility]"      Discharge Placement              Patient chooses bed at: [facility] Patient to be transferred to facility by: [place] Name of family member notified: Tim Patient and family notified of of transfer: 01/25/19  Discharge Plan and Services In-house Referral: Clinical Social Work   Post Acute Care Choice: (memory care)                               Social Determinants of Health (SDOH) Interventions     Readmission Risk Interventions No flowsheet data found.

## 2019-01-27 NOTE — Progress Notes (Signed)
Patient transported off unit via [facility]. All paperwork given to [facility]. Patient in no sign of distress.

## 2019-01-29 DIAGNOSIS — I10 Essential (primary) hypertension: Secondary | ICD-10-CM | POA: Diagnosis not present

## 2019-01-29 DIAGNOSIS — I63512 Cerebral infarction due to unspecified occlusion or stenosis of left middle cerebral artery: Secondary | ICD-10-CM | POA: Diagnosis not present

## 2019-01-29 DIAGNOSIS — Z7982 Long term (current) use of aspirin: Secondary | ICD-10-CM | POA: Diagnosis not present

## 2019-01-29 DIAGNOSIS — R262 Difficulty in walking, not elsewhere classified: Secondary | ICD-10-CM | POA: Diagnosis not present

## 2019-01-29 DIAGNOSIS — I129 Hypertensive chronic kidney disease with stage 1 through stage 4 chronic kidney disease, or unspecified chronic kidney disease: Secondary | ICD-10-CM | POA: Diagnosis not present

## 2019-01-29 DIAGNOSIS — E1122 Type 2 diabetes mellitus with diabetic chronic kidney disease: Secondary | ICD-10-CM | POA: Diagnosis not present

## 2019-01-29 DIAGNOSIS — Z794 Long term (current) use of insulin: Secondary | ICD-10-CM | POA: Diagnosis not present

## 2019-01-29 DIAGNOSIS — M25551 Pain in right hip: Secondary | ICD-10-CM | POA: Diagnosis not present

## 2019-01-29 DIAGNOSIS — I69351 Hemiplegia and hemiparesis following cerebral infarction affecting right dominant side: Secondary | ICD-10-CM | POA: Diagnosis not present

## 2019-01-29 DIAGNOSIS — I69391 Dysphagia following cerebral infarction: Secondary | ICD-10-CM | POA: Diagnosis not present

## 2019-01-29 DIAGNOSIS — I69314 Frontal lobe and executive function deficit following cerebral infarction: Secondary | ICD-10-CM | POA: Diagnosis not present

## 2019-01-29 DIAGNOSIS — R279 Unspecified lack of coordination: Secondary | ICD-10-CM | POA: Diagnosis not present

## 2019-01-29 DIAGNOSIS — E785 Hyperlipidemia, unspecified: Secondary | ICD-10-CM | POA: Diagnosis not present

## 2019-01-29 DIAGNOSIS — S79911A Unspecified injury of right hip, initial encounter: Secondary | ICD-10-CM | POA: Diagnosis not present

## 2019-01-29 DIAGNOSIS — M79651 Pain in right thigh: Secondary | ICD-10-CM | POA: Diagnosis not present

## 2019-01-29 DIAGNOSIS — N400 Enlarged prostate without lower urinary tract symptoms: Secondary | ICD-10-CM | POA: Diagnosis not present

## 2019-01-29 DIAGNOSIS — R1111 Vomiting without nausea: Secondary | ICD-10-CM | POA: Diagnosis not present

## 2019-01-29 DIAGNOSIS — N183 Chronic kidney disease, stage 3 unspecified: Secondary | ICD-10-CM | POA: Diagnosis not present

## 2019-01-29 DIAGNOSIS — Z8673 Personal history of transient ischemic attack (TIA), and cerebral infarction without residual deficits: Secondary | ICD-10-CM | POA: Diagnosis not present

## 2019-01-29 DIAGNOSIS — U071 COVID-19: Secondary | ICD-10-CM | POA: Diagnosis not present

## 2019-01-29 DIAGNOSIS — W19XXXA Unspecified fall, initial encounter: Secondary | ICD-10-CM | POA: Diagnosis not present

## 2019-01-29 DIAGNOSIS — Z79899 Other long term (current) drug therapy: Secondary | ICD-10-CM | POA: Diagnosis not present

## 2019-01-29 DIAGNOSIS — M25552 Pain in left hip: Secondary | ICD-10-CM | POA: Diagnosis not present

## 2019-01-29 DIAGNOSIS — I69322 Dysarthria following cerebral infarction: Secondary | ICD-10-CM | POA: Diagnosis not present

## 2019-01-29 DIAGNOSIS — F0151 Vascular dementia with behavioral disturbance: Secondary | ICD-10-CM | POA: Diagnosis not present

## 2019-01-29 DIAGNOSIS — S79912A Unspecified injury of left hip, initial encounter: Secondary | ICD-10-CM | POA: Diagnosis not present

## 2019-01-29 DIAGNOSIS — E119 Type 2 diabetes mellitus without complications: Secondary | ICD-10-CM | POA: Diagnosis not present

## 2019-01-29 DIAGNOSIS — S299XXA Unspecified injury of thorax, initial encounter: Secondary | ICD-10-CM | POA: Diagnosis not present

## 2019-01-29 DIAGNOSIS — M6281 Muscle weakness (generalized): Secondary | ICD-10-CM | POA: Diagnosis not present

## 2019-01-29 DIAGNOSIS — K5900 Constipation, unspecified: Secondary | ICD-10-CM | POA: Diagnosis not present

## 2019-01-29 DIAGNOSIS — R5381 Other malaise: Secondary | ICD-10-CM | POA: Diagnosis not present

## 2019-01-29 DIAGNOSIS — Z23 Encounter for immunization: Secondary | ICD-10-CM | POA: Diagnosis not present

## 2019-01-29 DIAGNOSIS — F0390 Unspecified dementia without behavioral disturbance: Secondary | ICD-10-CM | POA: Diagnosis not present

## 2019-01-29 DIAGNOSIS — R1312 Dysphagia, oropharyngeal phase: Secondary | ICD-10-CM | POA: Diagnosis not present

## 2019-01-29 DIAGNOSIS — R52 Pain, unspecified: Secondary | ICD-10-CM | POA: Diagnosis not present

## 2019-01-29 DIAGNOSIS — Z7902 Long term (current) use of antithrombotics/antiplatelets: Secondary | ICD-10-CM | POA: Diagnosis not present

## 2019-01-29 DIAGNOSIS — E875 Hyperkalemia: Secondary | ICD-10-CM | POA: Diagnosis not present

## 2019-01-29 DIAGNOSIS — R079 Chest pain, unspecified: Secondary | ICD-10-CM | POA: Diagnosis not present

## 2019-01-29 DIAGNOSIS — Z743 Need for continuous supervision: Secondary | ICD-10-CM | POA: Diagnosis not present

## 2019-01-29 DIAGNOSIS — R410 Disorientation, unspecified: Secondary | ICD-10-CM | POA: Diagnosis not present

## 2019-01-29 DIAGNOSIS — M1990 Unspecified osteoarthritis, unspecified site: Secondary | ICD-10-CM | POA: Diagnosis not present

## 2019-02-03 ENCOUNTER — Emergency Department (HOSPITAL_COMMUNITY): Payer: Medicare Other

## 2019-02-03 ENCOUNTER — Emergency Department (HOSPITAL_COMMUNITY)
Admission: EM | Admit: 2019-02-03 | Discharge: 2019-02-04 | Disposition: A | Discharge status: Home / Self Care | Payer: Medicare Other | Attending: Emergency Medicine | Admitting: Emergency Medicine

## 2019-02-03 ENCOUNTER — Encounter (HOSPITAL_COMMUNITY): Payer: Self-pay | Admitting: *Deleted

## 2019-02-03 DIAGNOSIS — M25551 Pain in right hip: Secondary | ICD-10-CM | POA: Insufficient documentation

## 2019-02-03 DIAGNOSIS — I10 Essential (primary) hypertension: Secondary | ICD-10-CM | POA: Diagnosis not present

## 2019-02-03 DIAGNOSIS — E1122 Type 2 diabetes mellitus with diabetic chronic kidney disease: Secondary | ICD-10-CM | POA: Insufficient documentation

## 2019-02-03 DIAGNOSIS — Z7982 Long term (current) use of aspirin: Secondary | ICD-10-CM | POA: Insufficient documentation

## 2019-02-03 DIAGNOSIS — F0390 Unspecified dementia without behavioral disturbance: Secondary | ICD-10-CM | POA: Diagnosis not present

## 2019-02-03 DIAGNOSIS — E875 Hyperkalemia: Secondary | ICD-10-CM | POA: Diagnosis not present

## 2019-02-03 DIAGNOSIS — I129 Hypertensive chronic kidney disease with stage 1 through stage 4 chronic kidney disease, or unspecified chronic kidney disease: Secondary | ICD-10-CM | POA: Insufficient documentation

## 2019-02-03 DIAGNOSIS — S299XXA Unspecified injury of thorax, initial encounter: Secondary | ICD-10-CM | POA: Diagnosis not present

## 2019-02-03 DIAGNOSIS — S79912A Unspecified injury of left hip, initial encounter: Secondary | ICD-10-CM | POA: Diagnosis not present

## 2019-02-03 DIAGNOSIS — Z79899 Other long term (current) drug therapy: Secondary | ICD-10-CM | POA: Insufficient documentation

## 2019-02-03 DIAGNOSIS — S79911A Unspecified injury of right hip, initial encounter: Secondary | ICD-10-CM | POA: Diagnosis not present

## 2019-02-03 DIAGNOSIS — Z794 Long term (current) use of insulin: Secondary | ICD-10-CM | POA: Insufficient documentation

## 2019-02-03 DIAGNOSIS — Z8673 Personal history of transient ischemic attack (TIA), and cerebral infarction without residual deficits: Secondary | ICD-10-CM | POA: Insufficient documentation

## 2019-02-03 DIAGNOSIS — Z7902 Long term (current) use of antithrombotics/antiplatelets: Secondary | ICD-10-CM | POA: Insufficient documentation

## 2019-02-03 DIAGNOSIS — U071 COVID-19: Secondary | ICD-10-CM | POA: Diagnosis not present

## 2019-02-03 DIAGNOSIS — M25552 Pain in left hip: Secondary | ICD-10-CM | POA: Diagnosis not present

## 2019-02-03 DIAGNOSIS — N183 Chronic kidney disease, stage 3 unspecified: Secondary | ICD-10-CM | POA: Insufficient documentation

## 2019-02-03 LAB — PROTIME-INR
INR: 1.1 (ref 0.8–1.2)
Prothrombin Time: 13.8 seconds (ref 11.4–15.2)

## 2019-02-03 LAB — BASIC METABOLIC PANEL
Anion gap: 9 (ref 5–15)
BUN: 39 mg/dL — ABNORMAL HIGH (ref 8–23)
CO2: 21 mmol/L — ABNORMAL LOW (ref 22–32)
Calcium: 8.6 mg/dL — ABNORMAL LOW (ref 8.9–10.3)
Chloride: 106 mmol/L (ref 98–111)
Creatinine, Ser: 1.46 mg/dL — ABNORMAL HIGH (ref 0.61–1.24)
GFR calc Af Amer: 52 mL/min — ABNORMAL LOW (ref >60)
GFR calc non Af Amer: 44 mL/min — ABNORMAL LOW (ref >60)
Glucose, Bld: 170 mg/dL — ABNORMAL HIGH (ref 70–99)
Potassium: 5.3 mmol/L — ABNORMAL HIGH (ref 3.5–5.1)
Sodium: 136 mmol/L (ref 135–145)

## 2019-02-03 LAB — CBC
HCT: 32.2 % — ABNORMAL LOW (ref 39.0–52.0)
Hemoglobin: 9.8 g/dL — ABNORMAL LOW (ref 13.0–17.0)
MCH: 30 pg (ref 26.0–34.0)
MCHC: 30.4 g/dL (ref 30.0–36.0)
MCV: 98.5 fL (ref 80.0–100.0)
Platelets: 259 10*3/uL (ref 150–400)
RBC: 3.27 MIL/uL — ABNORMAL LOW (ref 4.22–5.81)
RDW: 15 % (ref 11.5–15.5)
WBC: 9 10*3/uL (ref 4.0–10.5)
nRBC: 0 % (ref 0.0–0.2)

## 2019-02-03 LAB — ABO/RH: ABO/RH(D): A POS

## 2019-02-03 LAB — TYPE AND SCREEN
ABO/RH(D): A POS
Antibody Screen: NEGATIVE

## 2019-02-03 MED ORDER — SODIUM CHLORIDE 0.9 % IV BOLUS
500.0000 mL | Freq: Once | INTRAVENOUS | Status: AC
  Administered 2019-02-03: 17:00:00 500 mL via INTRAVENOUS

## 2019-02-03 MED ORDER — LORAZEPAM 2 MG/ML IJ SOLN
0.5000 mg | Freq: Once | INTRAMUSCULAR | Status: AC
  Administered 2019-02-03: 0.5 mg via INTRAVENOUS
  Filled 2019-02-03: qty 1

## 2019-02-03 MED ORDER — HYDROCODONE-ACETAMINOPHEN 5-325 MG PO TABS: 1.0000 | ORAL_TABLET | ORAL | 0 refills | Status: DC | PRN

## 2019-02-03 MED ORDER — FENTANYL CITRATE (PF) 100 MCG/2ML IJ SOLN
25.0000 ug | Freq: Once | INTRAMUSCULAR | Status: AC
  Administered 2019-02-03: 17:00:00 25 ug via INTRAVENOUS
  Filled 2019-02-03: qty 2

## 2019-02-03 NOTE — ED Notes (Signed)
Dialed sons number so pt could talk to him.

## 2019-02-03 NOTE — ED Notes (Signed)
Gay Filler (son in law) [phone]

## 2019-02-03 NOTE — ED Provider Notes (Addendum)
Accepted handoff at shift change from Hina K PA-C. Please see prior provider note for more detail.   Briefly: Patient is 84 y.o.   "Casey Collins is a 84 y.o. male with a past medical history of dementia, prior CVA, diabetes, hypertension, hyperlipidemia presenting to the ED from SNF after fall out of the bed a few days ago.  He had x-ray done today which showed a right femoral neck fracture.  He was sent to the ED for further evaluation.  Patient reports pain in his right hip as well as all over his body. Remainder of history is limited secondary to dementia. He does deny any current chest or abdominal pain."   Physical Exam  BP 114/61   Pulse (!) 53   Temp 97.8 F (36.6 C) (Oral)   Resp 13   SpO2 97%   CONSTITUTIONAL:  well-appearing, NAD NEURO:  Alert and oriented x 3, no focal deficits EYES:  pupils equal and reactive ENT/NECK:  trachea midline, no JVD CARDIO:  reg rate, reg rhythm, well-perfused PULM:  None labored breathing GI/GU:  Abdomen non-distended MSK/SPINE:  No gross deformities, no edema. Able to flex and extend without pain.Patient is able to internally and externally rotate hipNo shortening or rotation of hip.Some tenderness to palpation of right hip over the anterior aspect.  SKIN:  no rash obvious, atraumatic, no ecchymosis  PSYCH:  Appropriate speech and behavior Pulses in bilateral lower extremities palpable.   Results for orders placed or performed during the hospital encounter of 02/03/19  CBC  Result Value Ref Range   WBC 9.0 4.0 - 10.5 K/uL   RBC 3.27 (L) 4.22 - 5.81 MIL/uL   Hemoglobin 9.8 (L) 13.0 - 17.0 g/dL   HCT 32.2 (L) 39.0 - 52.0 %   MCV 98.5 80.0 - 100.0 fL   MCH 30.0 26.0 - 34.0 pg   MCHC 30.4 30.0 - 36.0 g/dL   RDW 15.0 11.5 - 15.5 %   Platelets 259 150 - 400 K/uL   nRBC 0.0 0.0 - 0.2 %  Basic metabolic panel  Result Value Ref Range   Sodium 136 135 - 145 mmol/L   Potassium 5.3 (H) 3.5 - 5.1 mmol/L   Chloride 106 98 - 111  mmol/L   CO2 21 (L) 22 - 32 mmol/L   Glucose, Bld 170 (H) 70 - 99 mg/dL   BUN 39 (H) 8 - 23 mg/dL   Creatinine, Ser 1.46 (H) 0.61 - 1.24 mg/dL   Calcium 8.6 (L) 8.9 - 10.3 mg/dL   GFR calc non Af Amer 44 (L) >60 mL/min   GFR calc Af Amer 52 (L) >60 mL/min   Anion gap 9 5 - 15  Protime-INR  Result Value Ref Range   Prothrombin Time 13.8 11.4 - 15.2 seconds   INR 1.1 0.8 - 1.2  Type and screen [place]  Result Value Ref Range   ABO/RH(D) PENDING    Antibody Screen PENDING    Sample Expiration      02/06/2019,2359 Performed at [facility], [address] [place], [place] [address]    DG Chest 2 View  Result Date: 01/21/2019 CLINICAL DATA:  Chest pain EXAM: CHEST - 2 VIEW COMPARISON:  December 27, 2018 FINDINGS: There is mild cardiomegaly. Tortuous ascending aorta. Aortic knob calcifications. Mildly increased interstitial markings seen at the left lung base. No large airspace consolidation or pleural effusion. The visualized skeletal structures are unremarkable. IMPRESSION: Nonspecific mildly increased interstitial markings at  the left lung base which could be due to atelectasis and/or early infectious etiology Electronically Signed   By: Prudencio Pair M.D.   On: 01/21/2019 20:05   DG Chest Port 1 View  Result Date: 02/03/2019 CLINICAL DATA:  Right hip pain since a fall out of bed a few days ago. COVID-19 positive patient. EXAM: PORTABLE CHEST 1 VIEW COMPARISON:  PA and lateral chest 01/21/2019. Single-view of the chest 12/27/2018. FINDINGS: Lungs clear. Heart size normal. No pneumothorax or pleural fluid. No acute or focal bony abnormality. IMPRESSION: No acute disease Electronically Signed   By: Inge Rise M.D.   On: 02/03/2019 14:34   ECHOCARDIOGRAM COMPLETE  Result Date: 01/22/2019   ECHOCARDIOGRAM REPORT   Patient Name:   Casey Collins [id number] Date of Exam: 01/22/2019 Medical Rec #:  [id number]             Height:       70.5 in Accession #:     [id number]            Weight:       182.3 lb Date of Birth:  06-Aug-1935             BSA:          2.02 m Patient Age:    34 years              BP:           152/66 mmHg Patient Gender: M                     HR:           59 bpm. Exam Location:  Inpatient Procedure: 2D Echo, Color Doppler and Cardiac Doppler Indications:    R07.9* Chest pain, unspecified  History:        Patient has prior history of Echocardiogram examinations, most                 recent 12/02/2012. Risk Factors:Hypertension, Diabetes and                 Dyslipidemia. COVID+ 12/01/18.  Sonographer:    Raquel Sarna Senior RDCS Referring Phys: [place]  1. Left ventricular ejection fraction, by visual estimation, is 50%. The left ventricle has low normal to mildly reduced function. Left ventricular septal wall thickness was mildly increased. There is mildly increased left ventricular hypertrophy.  2. Elevated left ventricular end-diastolic pressure.  3. Left ventricular diastolic parameters are consistent with Grade I diastolic dysfunction (impaired relaxation).  4. The left ventricle demonstrates global hypokinesis.  5. Global right ventricle has normal systolic function.The right ventricular size is normal. No increase in right ventricular wall thickness.  6. Left atrial size was normal.  7. Right atrial size was normal.  8. Mild mitral annular calcification.  9. The mitral valve is degenerative. Mild mitral valve regurgitation. 10. The tricuspid valve is grossly normal. 11. The aortic valve is tricuspid. Aortic valve regurgitation is mild. Mild aortic valve sclerosis without stenosis. 12. The pulmonic valve was grossly normal. Pulmonic valve regurgitation is not visualized. 13. The inferior vena cava is dilated in size with <50% respiratory variability, suggesting right atrial pressure of 15 mmHg. FINDINGS  Left Ventricle: Left ventricular ejection fraction, by visual estimation, is 50%. The left ventricle has low normal function.  The left ventricle demonstrates global hypokinesis. The left ventricular internal cavity size was the left ventricle is normal in size. There is mildly increased left  ventricular hypertrophy. Concentric left ventricular hypertrophy. Left ventricular diastolic parameters are consistent with Grade I diastolic dysfunction (impaired relaxation). Elevated left ventricular end-diastolic pressure. Right Ventricle: The right ventricular size is normal. No increase in right ventricular wall thickness. Global RV systolic function is has normal systolic function. Left Atrium: Left atrial size was normal in size. Right Atrium: Right atrial size was normal in size Pericardium: There is no evidence of pericardial effusion. Mitral Valve: The mitral valve is degenerative in appearance. There is mild thickening of the mitral valve leaflet(s). Mild mitral annular calcification. Mild mitral valve regurgitation. Tricuspid Valve: The tricuspid valve is grossly normal. Tricuspid valve regurgitation is not demonstrated. Aortic Valve: The aortic valve is tricuspid. . There is mild thickening of the aortic valve. Aortic valve regurgitation is mild. Mild aortic valve sclerosis is present, with no evidence of aortic valve stenosis. There is mild thickening of the aortic valve. Pulmonic Valve: The pulmonic valve was grossly normal. Pulmonic valve regurgitation is not visualized. Pulmonic regurgitation is not visualized. Aorta: The aortic root is normal in size and structure. Venous: The inferior vena cava is dilated in size with less than 50% respiratory variability, suggesting right atrial pressure of 15 mmHg. IAS/Shunts: No atrial level shunt detected by color flow Doppler.  LEFT VENTRICLE PLAX 2D LVIDd:         4.90 cm       Diastology LVIDs:         3.10 cm       LV e' lateral:   6.53 cm/s LV PW:         1.10 cm       LV E/e' lateral: 13.2 LV IVS:        1.20 cm       LV e' medial:    4.46 cm/s LVOT diam:     2.00 cm       LV E/e' medial:   19.3 LV SV:         75 ml LV SV Index:   36.74 LVOT Area:     3.14 cm  LV Volumes (MOD) LV area d, A2C:    34.70 cm LV area d, A4C:    35.00 cm LV area s, A2C:    21.90 cm LV area s, A4C:    23.90 cm LV major d, A2C:   8.91 cm LV major d, A4C:   9.25 cm LV major s, A2C:   7.34 cm LV major s, A4C:   7.86 cm LV vol d, MOD A2C: 112.0 ml LV vol d, MOD A4C: 107.0 ml LV vol s, MOD A2C: 54.1 ml LV vol s, MOD A4C: 59.8 ml LV SV MOD A2C:     57.9 ml LV SV MOD A4C:     107.0 ml LV SV MOD BP:      51.9 ml LEFT ATRIUM           Index       RIGHT ATRIUM           Index LA diam:      3.70 cm 1.83 cm/m  RA Area:     17.20 cm LA Vol (A2C): 62.9 ml 31.18 ml/m RA Volume:   44.00 ml  21.81 ml/m LA Vol (A4C): 64.7 ml 32.07 ml/m  AORTIC VALVE LVOT Vmax:   81.60 cm/s LVOT Vmean:  60.000 cm/s LVOT VTI:    0.212 m  AORTA Ao Root diam: 3.40 cm MITRAL VALVE MV Area (PHT): 2.20 cm  SHUNTS MV PHT:        100.05 msec           Systemic VTI:  0.21 m MV Decel Time: 345 msec              Systemic Diam: 2.00 cm MV E velocity: 86.10 cm/s  103 cm/s MV A velocity: 101.00 cm/s 70.3 cm/s MV E/A ratio:  0.85        1.5  Kate Sable MD Electronically signed by Kate Sable MD Signature Date/Time: 01/22/2019/11:01:07 AM    Final    DG Hip Unilat W or Wo Pelvis 2-3 Views Right  Result Date: 02/03/2019 CLINICAL DATA:  Right hip pain since a fall out of bed a few days ago. Initial encounter. EXAM: DG HIP (WITH OR WITHOUT PELVIS) 2-3V RIGHT COMPARISON:  Plain films right hip 12/05/2018. FINDINGS: There is no acute bony or joint abnormality. The patient has advanced right hip osteoarthritis as seen on the prior study. No focal lesion. Atherosclerosis noted. IMPRESSION: No acute abnormality. Advanced right hip osteoarthritis. Electronically Signed   By: Inge Rise M.D.   On: 02/03/2019 14:35     ED Course/Procedures   Clinical Course as of Feb 03 2247  Wed Feb 03, 2019  1506 I have attempted to call Norvel Richards from  [facility] for further HPI without success.   [HK]  1531 Negative for acute abnormality.  DG Hip Unilat W or Wo Pelvis 2-3 Views Right [HK]  1531 Potassium(!): 5.3 [HK]  1532 Again attempted to speak to caretaker at [facility] without success.   [HK]  [id number] Down from 10.4 at discharge 10 days ago. Appears 10.4 is his baseline.  Hemoglobin(!): 9.8 [HK]  1600 Spoke to [place] at [facility].  States patient never had a fall at the facility.  They believe that he had this pain since being discharged from the hospital and started complaining of pain 2 days ago.  X-ray was obtained on 02/01/2019 but resulted today.  He also had blood work today showing potassium greater than 8 and a hemoglobin of 9.6.   [HK]  [id number] I have viewed paperwork at bedside that indeed shows K of 8 and hgb of 9.4, but no xray report.   [HK]  2245 Bilateral hips with degenerative changes.  No acute fracture, osteomyelitis, dislocation or other abnormality.  CT Hip Right Wo Contrast [WF]    Clinical Course User Index [HK] Delia Heady, PA-C [WF] Pati Gallo [place], [place]    Procedures  MDM   Per Shelly Coss PA-C: Patient here from facility with concerns that he has hip fracture and elevated potassium and low hemoglobin.  In ED today hemoglobin is stable from prior and he has no symptoms of hemorrhage or black stools per patient.  He is hemodynamically within normal limits with no tachycardia or hypotension to indicate acute blood loss.  X-ray of hip shows no fracture.  Will follow up with CT scan to rule out occult fracture.  Potassium is within normal limits there may be some element of hemolysis that could have caused an elevated potassium rate at facility.  No evidence of QRS lengthening, PR lengthening, peaked T waves to indicate hyperkalemia.  We will review CT scan, discussed results with patient and son and determine disposition based on results.  It appears that patient's original blood work sample may have erroneously  had elevated potassium.  This may be due to hemolysis.  On redraw here in [facility], ED potassium was found  to be 5.3.  Mildly elevated without changes in EKG.  Patient is not appear to be symptomatic.  Suspect the patient has very mildly elevated potassium will be rechecked by primary care doctor.  9:50 PM --patient transported back from [place].  At this time is discovered that original order for CT scan was ordered of the left hip however this was the incorrect hip as patient is having symptoms of his right hip.  Called and discussed this occurrence with son who stated his frustration.  At this time he is requesting patient be sent home with pain medication.   9:52 PM -- CT tech called to inform that sufficient imaging was done to evaluate right hip in addition to left hip.  Correct order placed for right CT of hip.  Final CT interpretation by radiologist pending at this time.  CT scan negative for any occult fracture.  Patient is ambulatory in room.  At time of shift change patient is a for discharge PTR will transport.   Patient has been discharged at this time I called to discuss case with him patient's son second time.  He is understandably upset that patient had CT scan done of the wrong hip.  He is understanding of the mistake but would like to make sure that he is not billed for this CT scan.  I stated that I will notate in my documentation that a mistake was made and that CT scan of both of his hips were obtained.   Tedd Sias, [place] 02/04/19 0006    Tedd Sias, PA 02/04/19 [place]    Nat Christen, MD 02/05/19 2157

## 2019-02-03 NOTE — ED Triage Notes (Signed)
To ED via [place] from [facility] in [place] for eval of right hip pain. Pt is originally from [facility] but moved to [address] recently due to covid positive. [facility] out of bed a few days ago. Transferred from ems stretcher into recliner chair for triage. Pt alert to person and situation only.

## 2019-02-03 NOTE — ED Notes (Signed)
Attempted to call report to [facility] place, no answer

## 2019-02-03 NOTE — Discharge Instructions (Addendum)
Please recheck your potassium in 1 week. Return to the ED if you start to have worsening symptoms, injuries or falls, chest pain or shortness of breath.  Your CT scan shows no fractures

## 2019-02-03 NOTE — ED Notes (Signed)
Report called to [facility] place. Report given to [facility].
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
## 2019-02-03 NOTE — ED Notes (Signed)
Tanesha @ [facility] called to report xray results fx right fem neck.  Labs:  K+ low, Hgb low.  Covid +

## 2019-02-04 ENCOUNTER — Other Ambulatory Visit: Payer: Self-pay | Admitting: *Deleted

## 2019-02-04 NOTE — ED Notes (Signed)
Pt discharge with PTAR back to [facility].

## 2019-02-04 NOTE — Patient Outreach (Signed)
Member screened for potential [facility] Care Management needs as a benefit of [place] Medicare.  Member is currently is receiving skilled therapy at [facility].  Per chart records, member is from [facility]/memory care.   Will continue to follow for disposition plans while member resides at [facility].   Marthenia Rolling, MSN-Ed, RN,BSN [place] Acute Care Coordinator [phone] [facility]) [phone]  (Toll free office)

## 2019-02-17 DIAGNOSIS — I10 Essential (primary) hypertension: Secondary | ICD-10-CM | POA: Diagnosis not present

## 2019-02-17 DIAGNOSIS — K5900 Constipation, unspecified: Secondary | ICD-10-CM | POA: Diagnosis not present

## 2019-02-17 DIAGNOSIS — R079 Chest pain, unspecified: Secondary | ICD-10-CM | POA: Diagnosis not present

## 2019-02-17 DIAGNOSIS — U071 COVID-19: Secondary | ICD-10-CM | POA: Diagnosis not present

## 2019-02-26 ENCOUNTER — Other Ambulatory Visit: Payer: Self-pay | Admitting: *Deleted

## 2019-02-26 NOTE — Patient Outreach (Signed)
Member screened for potential [facility] Care Management needs as a benefit of [place] Medicare.  Verified member has transitioned from [facility] has returned to [facility].    Marthenia Rolling, MSN-Ed, RN,BSN [place] Acute Care Coordinator [phone] [facility]) [phone]  (Toll free office)

## 2019-03-03 DIAGNOSIS — R2689 Other abnormalities of gait and mobility: Secondary | ICD-10-CM | POA: Diagnosis not present

## 2019-03-03 DIAGNOSIS — R2681 Unsteadiness on feet: Secondary | ICD-10-CM | POA: Diagnosis not present

## 2019-03-04 DIAGNOSIS — R293 Abnormal posture: Secondary | ICD-10-CM | POA: Diagnosis not present

## 2019-03-04 DIAGNOSIS — M6281 Muscle weakness (generalized): Secondary | ICD-10-CM | POA: Diagnosis not present

## 2019-03-04 DIAGNOSIS — R278 Other lack of coordination: Secondary | ICD-10-CM | POA: Diagnosis not present

## 2019-03-05 DIAGNOSIS — R2681 Unsteadiness on feet: Secondary | ICD-10-CM | POA: Diagnosis not present

## 2019-03-05 DIAGNOSIS — R2689 Other abnormalities of gait and mobility: Secondary | ICD-10-CM | POA: Diagnosis not present

## 2019-03-08 DIAGNOSIS — R278 Other lack of coordination: Secondary | ICD-10-CM | POA: Diagnosis not present

## 2019-03-08 DIAGNOSIS — I6930 Unspecified sequelae of cerebral infarction: Secondary | ICD-10-CM | POA: Diagnosis not present

## 2019-03-08 DIAGNOSIS — R2681 Unsteadiness on feet: Secondary | ICD-10-CM | POA: Diagnosis not present

## 2019-03-08 DIAGNOSIS — R2689 Other abnormalities of gait and mobility: Secondary | ICD-10-CM | POA: Diagnosis not present

## 2019-03-08 DIAGNOSIS — I10 Essential (primary) hypertension: Secondary | ICD-10-CM | POA: Diagnosis not present

## 2019-03-08 DIAGNOSIS — E119 Type 2 diabetes mellitus without complications: Secondary | ICD-10-CM | POA: Diagnosis not present

## 2019-03-08 DIAGNOSIS — F0390 Unspecified dementia without behavioral disturbance: Secondary | ICD-10-CM | POA: Diagnosis not present

## 2019-03-08 DIAGNOSIS — R293 Abnormal posture: Secondary | ICD-10-CM | POA: Diagnosis not present

## 2019-03-08 DIAGNOSIS — M6281 Muscle weakness (generalized): Secondary | ICD-10-CM | POA: Diagnosis not present

## 2019-03-08 DIAGNOSIS — F331 Major depressive disorder, recurrent, moderate: Secondary | ICD-10-CM | POA: Diagnosis not present

## 2019-03-08 DIAGNOSIS — M1611 Unilateral primary osteoarthritis, right hip: Secondary | ICD-10-CM | POA: Diagnosis not present

## 2019-03-08 DIAGNOSIS — H02843 Edema of right eye, unspecified eyelid: Secondary | ICD-10-CM | POA: Diagnosis not present

## 2019-03-10 DIAGNOSIS — M6281 Muscle weakness (generalized): Secondary | ICD-10-CM | POA: Diagnosis not present

## 2019-03-10 DIAGNOSIS — R2689 Other abnormalities of gait and mobility: Secondary | ICD-10-CM | POA: Diagnosis not present

## 2019-03-10 DIAGNOSIS — R278 Other lack of coordination: Secondary | ICD-10-CM | POA: Diagnosis not present

## 2019-03-10 DIAGNOSIS — R293 Abnormal posture: Secondary | ICD-10-CM | POA: Diagnosis not present

## 2019-03-10 DIAGNOSIS — R2681 Unsteadiness on feet: Secondary | ICD-10-CM | POA: Diagnosis not present

## 2019-03-12 DIAGNOSIS — R2681 Unsteadiness on feet: Secondary | ICD-10-CM | POA: Diagnosis not present

## 2019-03-12 DIAGNOSIS — M6281 Muscle weakness (generalized): Secondary | ICD-10-CM | POA: Diagnosis not present

## 2019-03-12 DIAGNOSIS — Z23 Encounter for immunization: Secondary | ICD-10-CM | POA: Diagnosis not present

## 2019-03-12 DIAGNOSIS — R278 Other lack of coordination: Secondary | ICD-10-CM | POA: Diagnosis not present

## 2019-03-12 DIAGNOSIS — R293 Abnormal posture: Secondary | ICD-10-CM | POA: Diagnosis not present

## 2019-03-12 DIAGNOSIS — R2689 Other abnormalities of gait and mobility: Secondary | ICD-10-CM | POA: Diagnosis not present

## 2019-03-15 DIAGNOSIS — R293 Abnormal posture: Secondary | ICD-10-CM | POA: Diagnosis not present

## 2019-03-15 DIAGNOSIS — R278 Other lack of coordination: Secondary | ICD-10-CM | POA: Diagnosis not present

## 2019-03-15 DIAGNOSIS — R2681 Unsteadiness on feet: Secondary | ICD-10-CM | POA: Diagnosis not present

## 2019-03-15 DIAGNOSIS — R2689 Other abnormalities of gait and mobility: Secondary | ICD-10-CM | POA: Diagnosis not present

## 2019-03-15 DIAGNOSIS — M6281 Muscle weakness (generalized): Secondary | ICD-10-CM | POA: Diagnosis not present

## 2019-03-17 DIAGNOSIS — M6281 Muscle weakness (generalized): Secondary | ICD-10-CM | POA: Diagnosis not present

## 2019-03-17 DIAGNOSIS — R293 Abnormal posture: Secondary | ICD-10-CM | POA: Diagnosis not present

## 2019-03-17 DIAGNOSIS — R278 Other lack of coordination: Secondary | ICD-10-CM | POA: Diagnosis not present

## 2019-03-17 DIAGNOSIS — R2681 Unsteadiness on feet: Secondary | ICD-10-CM | POA: Diagnosis not present

## 2019-03-17 DIAGNOSIS — R2689 Other abnormalities of gait and mobility: Secondary | ICD-10-CM | POA: Diagnosis not present

## 2019-03-19 DIAGNOSIS — R278 Other lack of coordination: Secondary | ICD-10-CM | POA: Diagnosis not present

## 2019-03-19 DIAGNOSIS — R293 Abnormal posture: Secondary | ICD-10-CM | POA: Diagnosis not present

## 2019-03-19 DIAGNOSIS — M6281 Muscle weakness (generalized): Secondary | ICD-10-CM | POA: Diagnosis not present

## 2019-03-19 DIAGNOSIS — R2681 Unsteadiness on feet: Secondary | ICD-10-CM | POA: Diagnosis not present

## 2019-03-19 DIAGNOSIS — R2689 Other abnormalities of gait and mobility: Secondary | ICD-10-CM | POA: Diagnosis not present

## 2019-03-22 DIAGNOSIS — R278 Other lack of coordination: Secondary | ICD-10-CM | POA: Diagnosis not present

## 2019-03-22 DIAGNOSIS — R2689 Other abnormalities of gait and mobility: Secondary | ICD-10-CM | POA: Diagnosis not present

## 2019-03-22 DIAGNOSIS — M6281 Muscle weakness (generalized): Secondary | ICD-10-CM | POA: Diagnosis not present

## 2019-03-22 DIAGNOSIS — R2681 Unsteadiness on feet: Secondary | ICD-10-CM | POA: Diagnosis not present

## 2019-03-22 DIAGNOSIS — R293 Abnormal posture: Secondary | ICD-10-CM | POA: Diagnosis not present

## 2019-03-24 DIAGNOSIS — M6281 Muscle weakness (generalized): Secondary | ICD-10-CM | POA: Diagnosis not present

## 2019-03-24 DIAGNOSIS — R278 Other lack of coordination: Secondary | ICD-10-CM | POA: Diagnosis not present

## 2019-03-24 DIAGNOSIS — R2681 Unsteadiness on feet: Secondary | ICD-10-CM | POA: Diagnosis not present

## 2019-03-24 DIAGNOSIS — R2689 Other abnormalities of gait and mobility: Secondary | ICD-10-CM | POA: Diagnosis not present

## 2019-03-24 DIAGNOSIS — R293 Abnormal posture: Secondary | ICD-10-CM | POA: Diagnosis not present

## 2019-03-25 DIAGNOSIS — R2681 Unsteadiness on feet: Secondary | ICD-10-CM | POA: Diagnosis not present

## 2019-03-25 DIAGNOSIS — R2689 Other abnormalities of gait and mobility: Secondary | ICD-10-CM | POA: Diagnosis not present

## 2019-03-26 DIAGNOSIS — R293 Abnormal posture: Secondary | ICD-10-CM | POA: Diagnosis not present

## 2019-03-26 DIAGNOSIS — R278 Other lack of coordination: Secondary | ICD-10-CM | POA: Diagnosis not present

## 2019-03-26 DIAGNOSIS — M6281 Muscle weakness (generalized): Secondary | ICD-10-CM | POA: Diagnosis not present

## 2019-03-29 DIAGNOSIS — M6281 Muscle weakness (generalized): Secondary | ICD-10-CM | POA: Diagnosis not present

## 2019-03-29 DIAGNOSIS — R293 Abnormal posture: Secondary | ICD-10-CM | POA: Diagnosis not present

## 2019-03-29 DIAGNOSIS — R2689 Other abnormalities of gait and mobility: Secondary | ICD-10-CM | POA: Diagnosis not present

## 2019-03-29 DIAGNOSIS — R278 Other lack of coordination: Secondary | ICD-10-CM | POA: Diagnosis not present

## 2019-03-29 DIAGNOSIS — R2681 Unsteadiness on feet: Secondary | ICD-10-CM | POA: Diagnosis not present

## 2019-03-31 DIAGNOSIS — R2681 Unsteadiness on feet: Secondary | ICD-10-CM | POA: Diagnosis not present

## 2019-03-31 DIAGNOSIS — R278 Other lack of coordination: Secondary | ICD-10-CM | POA: Diagnosis not present

## 2019-03-31 DIAGNOSIS — R293 Abnormal posture: Secondary | ICD-10-CM | POA: Diagnosis not present

## 2019-03-31 DIAGNOSIS — M6281 Muscle weakness (generalized): Secondary | ICD-10-CM | POA: Diagnosis not present

## 2019-03-31 DIAGNOSIS — R2689 Other abnormalities of gait and mobility: Secondary | ICD-10-CM | POA: Diagnosis not present

## 2019-04-01 DIAGNOSIS — R2681 Unsteadiness on feet: Secondary | ICD-10-CM | POA: Diagnosis not present

## 2019-04-01 DIAGNOSIS — R2689 Other abnormalities of gait and mobility: Secondary | ICD-10-CM | POA: Diagnosis not present

## 2019-04-02 DIAGNOSIS — R293 Abnormal posture: Secondary | ICD-10-CM | POA: Diagnosis not present

## 2019-04-02 DIAGNOSIS — M6281 Muscle weakness (generalized): Secondary | ICD-10-CM | POA: Diagnosis not present

## 2019-04-02 DIAGNOSIS — R278 Other lack of coordination: Secondary | ICD-10-CM | POA: Diagnosis not present

## 2019-04-03 DIAGNOSIS — R2681 Unsteadiness on feet: Secondary | ICD-10-CM | POA: Diagnosis not present

## 2019-04-03 DIAGNOSIS — R2689 Other abnormalities of gait and mobility: Secondary | ICD-10-CM | POA: Diagnosis not present

## 2019-04-05 DIAGNOSIS — R293 Abnormal posture: Secondary | ICD-10-CM | POA: Diagnosis not present

## 2019-04-05 DIAGNOSIS — R278 Other lack of coordination: Secondary | ICD-10-CM | POA: Diagnosis not present

## 2019-04-05 DIAGNOSIS — M6281 Muscle weakness (generalized): Secondary | ICD-10-CM | POA: Diagnosis not present

## 2019-04-07 ENCOUNTER — Encounter (HOSPITAL_COMMUNITY): Payer: Self-pay

## 2019-04-07 ENCOUNTER — Emergency Department (HOSPITAL_COMMUNITY): Payer: Medicare Other

## 2019-04-07 ENCOUNTER — Emergency Department (HOSPITAL_COMMUNITY)
Admission: EM | Admit: 2019-04-07 | Discharge: 2019-04-08 | Disposition: A | Discharge status: Home / Self Care | Payer: Medicare Other | Attending: Emergency Medicine | Admitting: Emergency Medicine

## 2019-04-07 DIAGNOSIS — Z7982 Long term (current) use of aspirin: Secondary | ICD-10-CM | POA: Diagnosis not present

## 2019-04-07 DIAGNOSIS — R600 Localized edema: Secondary | ICD-10-CM | POA: Insufficient documentation

## 2019-04-07 DIAGNOSIS — M545 Low back pain: Secondary | ICD-10-CM | POA: Diagnosis not present

## 2019-04-07 DIAGNOSIS — G459 Transient cerebral ischemic attack, unspecified: Secondary | ICD-10-CM | POA: Diagnosis not present

## 2019-04-07 DIAGNOSIS — Z794 Long term (current) use of insulin: Secondary | ICD-10-CM | POA: Diagnosis not present

## 2019-04-07 DIAGNOSIS — F0390 Unspecified dementia without behavioral disturbance: Secondary | ICD-10-CM | POA: Insufficient documentation

## 2019-04-07 DIAGNOSIS — Z7901 Long term (current) use of anticoagulants: Secondary | ICD-10-CM | POA: Insufficient documentation

## 2019-04-07 DIAGNOSIS — R109 Unspecified abdominal pain: Secondary | ICD-10-CM | POA: Diagnosis not present

## 2019-04-07 DIAGNOSIS — N183 Chronic kidney disease, stage 3 unspecified: Secondary | ICD-10-CM | POA: Insufficient documentation

## 2019-04-07 DIAGNOSIS — R1011 Right upper quadrant pain: Secondary | ICD-10-CM

## 2019-04-07 DIAGNOSIS — R4781 Slurred speech: Secondary | ICD-10-CM | POA: Diagnosis not present

## 2019-04-07 DIAGNOSIS — E1122 Type 2 diabetes mellitus with diabetic chronic kidney disease: Secondary | ICD-10-CM | POA: Diagnosis not present

## 2019-04-07 DIAGNOSIS — R52 Pain, unspecified: Secondary | ICD-10-CM | POA: Diagnosis not present

## 2019-04-07 DIAGNOSIS — R1031 Right lower quadrant pain: Secondary | ICD-10-CM | POA: Diagnosis present

## 2019-04-07 DIAGNOSIS — Z79899 Other long term (current) drug therapy: Secondary | ICD-10-CM | POA: Diagnosis not present

## 2019-04-07 DIAGNOSIS — M5489 Other dorsalgia: Secondary | ICD-10-CM | POA: Diagnosis not present

## 2019-04-07 DIAGNOSIS — I129 Hypertensive chronic kidney disease with stage 1 through stage 4 chronic kidney disease, or unspecified chronic kidney disease: Secondary | ICD-10-CM | POA: Diagnosis not present

## 2019-04-07 DIAGNOSIS — N3000 Acute cystitis without hematuria: Secondary | ICD-10-CM | POA: Insufficient documentation

## 2019-04-07 DIAGNOSIS — R531 Weakness: Secondary | ICD-10-CM | POA: Diagnosis not present

## 2019-04-07 DIAGNOSIS — R0902 Hypoxemia: Secondary | ICD-10-CM | POA: Diagnosis not present

## 2019-04-07 DIAGNOSIS — K8020 Calculus of gallbladder without cholecystitis without obstruction: Secondary | ICD-10-CM | POA: Diagnosis not present

## 2019-04-07 LAB — COMPREHENSIVE METABOLIC PANEL
ALT: 17 U/L (ref 0–44)
AST: 23 U/L (ref 15–41)
Albumin: 3.8 g/dL (ref 3.5–5.0)
Alkaline Phosphatase: 97 U/L (ref 38–126)
Anion gap: 10 (ref 5–15)
BUN: 34 mg/dL — ABNORMAL HIGH (ref 8–23)
CO2: 27 mmol/L (ref 22–32)
Calcium: 9.4 mg/dL (ref 8.9–10.3)
Chloride: 103 mmol/L (ref 98–111)
Creatinine, Ser: 1.91 mg/dL — ABNORMAL HIGH (ref 0.61–1.24)
GFR calc Af Amer: 37 mL/min — ABNORMAL LOW (ref >60)
GFR calc non Af Amer: 32 mL/min — ABNORMAL LOW (ref >60)
Glucose, Bld: 84 mg/dL (ref 70–99)
Potassium: 4.6 mmol/L (ref 3.5–5.1)
Sodium: 140 mmol/L (ref 135–145)
Total Bilirubin: 0.5 mg/dL (ref 0.3–1.2)
Total Protein: 6.9 g/dL (ref 6.5–8.1)

## 2019-04-07 LAB — CBC WITH DIFFERENTIAL/PLATELET
Abs Immature Granulocytes: 0.02 10*3/uL (ref 0.00–0.07)
Basophils Absolute: 0 10*3/uL (ref 0.0–0.1)
Basophils Relative: 0 %
Eosinophils Absolute: 0.5 10*3/uL (ref 0.0–0.5)
Eosinophils Relative: 6 %
HCT: 38.5 % — ABNORMAL LOW (ref 39.0–52.0)
Hemoglobin: 11.9 g/dL — ABNORMAL LOW (ref 13.0–17.0)
Immature Granulocytes: 0 %
Lymphocytes Relative: 15 %
Lymphs Abs: 1.4 10*3/uL (ref 0.7–4.0)
MCH: 29.9 pg (ref 26.0–34.0)
MCHC: 30.9 g/dL (ref 30.0–36.0)
MCV: 96.7 fL (ref 80.0–100.0)
Monocytes Absolute: 0.9 10*3/uL (ref 0.1–1.0)
Monocytes Relative: 9 %
Neutro Abs: 6.6 10*3/uL (ref 1.7–7.7)
Neutrophils Relative %: 70 %
Platelets: 249 10*3/uL (ref 150–400)
RBC: 3.98 MIL/uL — ABNORMAL LOW (ref 4.22–5.81)
RDW: 14.6 % (ref 11.5–15.5)
WBC: 9.4 10*3/uL (ref 4.0–10.5)
nRBC: 0 % (ref 0.0–0.2)

## 2019-04-07 LAB — PROTIME-INR
INR: 1 (ref 0.8–1.2)
Prothrombin Time: 13.5 seconds (ref 11.4–15.2)

## 2019-04-07 LAB — APTT: aPTT: 29 seconds (ref 24–36)

## 2019-04-07 MED ORDER — LIDOCAINE 5 % EX PTCH
1.0000 | MEDICATED_PATCH | CUTANEOUS | Status: DC
  Administered 2019-04-07: 1 via TRANSDERMAL
  Filled 2019-04-07: qty 1

## 2019-04-07 MED ORDER — IOHEXOL 300 MG/ML  SOLN: 80.0000 mL | Freq: Once | INTRAMUSCULAR | Status: DC | PRN

## 2019-04-07 MED ORDER — HYDROCODONE-ACETAMINOPHEN 5-325 MG PO TABS
1.0000 | ORAL_TABLET | Freq: Once | ORAL | Status: AC
  Administered 2019-04-07: 23:00:00 1 via ORAL
  Filled 2019-04-07: qty 1

## 2019-04-07 MED ORDER — SODIUM CHLORIDE 0.9 % IV BOLUS
500.0000 mL | Freq: Once | INTRAVENOUS | Status: AC
  Administered 2019-04-07: 23:00:00 500 mL via INTRAVENOUS

## 2019-04-07 NOTE — ED Notes (Signed)
Condom cath placed on patient

## 2019-04-07 NOTE — ED Notes (Signed)
Pt to and from CT. No distress noted. Medications given and charted per [facility]. Tolerated well.
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
## 2019-04-07 NOTE — ED Notes (Signed)
Pt came to the ED via EMS. Pt conscious, breathing, and A&Ox4. Pt brought back to [facility] via stretcher. EMS endorses "The patient is here for right lower back pain from a questionable fall earlier this morning". Chest rise and fall equally with non-labored breathing. Lungs clear apex to base. Abd soft and non-tender. Pt denies chest pain, n/v/d, shortness of breath, and f/c. PIVC placed on the RAC with a 20G which had positive blood return and flushed without pain or infiltration. Blood collected, labeled, and sent to lab. Bed in lowest position with call light within reach. Pt on continuous blood pressure, pulse ox, and cardiac monitor. Will continue to monitor. Awaiting MD eval. No distress noted.

## 2019-04-08 DIAGNOSIS — M5489 Other dorsalgia: Secondary | ICD-10-CM | POA: Diagnosis not present

## 2019-04-08 DIAGNOSIS — N3000 Acute cystitis without hematuria: Secondary | ICD-10-CM | POA: Diagnosis not present

## 2019-04-08 DIAGNOSIS — I499 Cardiac arrhythmia, unspecified: Secondary | ICD-10-CM | POA: Diagnosis not present

## 2019-04-08 DIAGNOSIS — Z743 Need for continuous supervision: Secondary | ICD-10-CM | POA: Diagnosis not present

## 2019-04-08 DIAGNOSIS — R279 Unspecified lack of coordination: Secondary | ICD-10-CM | POA: Diagnosis not present

## 2019-04-08 DIAGNOSIS — R52 Pain, unspecified: Secondary | ICD-10-CM | POA: Diagnosis not present

## 2019-04-08 LAB — URINALYSIS, ROUTINE W REFLEX MICROSCOPIC
Bilirubin Urine: NEGATIVE
Glucose, UA: NEGATIVE mg/dL
Hgb urine dipstick: NEGATIVE
Ketones, ur: NEGATIVE mg/dL
Nitrite: NEGATIVE
Protein, ur: NEGATIVE mg/dL
Specific Gravity, Urine: 1.013 (ref 1.005–1.030)
pH: 5 (ref 5.0–8.0)

## 2019-04-08 MED ORDER — CEPHALEXIN 500 MG PO CAPS: 500.0000 mg | ORAL_CAPSULE | Freq: Three times a day (TID) | ORAL | 0 refills | Status: DC

## 2019-04-08 MED ORDER — CEPHALEXIN 500 MG PO CAPS: 500.0000 mg | ORAL_CAPSULE | Freq: Two times a day (BID) | ORAL | 0 refills | Status: DC

## 2019-04-08 NOTE — Discharge Instructions (Signed)
Please take Keflex twice a day for your bladder infection

## 2019-04-08 NOTE — ED Notes (Signed)
Pt was discharged from the ED. Pt read and understood discharge paperwork. Pt had vital signs completed. Pt conscious, breathing, and A&Ox3. No distress noted. Pt speaking in complete sentences. Pt transported out of the ED via [place]. All belongings with pt. All paperwork given to PTAR.

## 2019-04-08 NOTE — ED Provider Notes (Signed)
I assumed care in signout to follow-up on labs.  Patient found to have UTI.  Also noted to have cholelithiasis without cholecystitis He is in no acute distress.  He reports his back pain is improved.  He has no focal back tenderness. He is not septic appearing.  Will discharge back to facility.  Will start on Keflex twice daily for 10 days. Urine culture has been sent   Ripley Fraise, MD 04/08/19 [phone]

## 2019-04-09 DIAGNOSIS — R2689 Other abnormalities of gait and mobility: Secondary | ICD-10-CM | POA: Diagnosis not present

## 2019-04-09 DIAGNOSIS — R2681 Unsteadiness on feet: Secondary | ICD-10-CM | POA: Diagnosis not present

## 2019-04-09 DIAGNOSIS — R278 Other lack of coordination: Secondary | ICD-10-CM | POA: Diagnosis not present

## 2019-04-09 DIAGNOSIS — M6281 Muscle weakness (generalized): Secondary | ICD-10-CM | POA: Diagnosis not present

## 2019-04-09 DIAGNOSIS — R293 Abnormal posture: Secondary | ICD-10-CM | POA: Diagnosis not present

## 2019-04-09 LAB — URINE CULTURE: Culture: >=100000 — AB

## 2019-04-11 ENCOUNTER — Telehealth: Payer: Self-pay | Admitting: Emergency Medicine

## 2019-04-11 NOTE — Telephone Encounter (Signed)
Post ED Visit - Positive Culture Follow-up  Culture report reviewed by antimicrobial stewardship pharmacist: [facility] []  [address] Pharm.D. []  Heide Guile, Pharm.D., BCPS AQ-ID []  Parks Neptune, Pharm.D., BCPS []  Alycia Rossetti, Pharm.D., BCPS []  Galliano, Pharm.D., BCPS, AAHIVP []  Legrand Como, Pharm.D., BCPS, AAHIVP []  Salome Arnt, PharmD, BCPS []  Johnnette Gourd, PharmD, BCPS []  Hughes Better, PharmD, BCPS [x]  Duanne Limerick, PharmD []  Laqueta Linden, PharmD, BCPS []  Albertina Parr, PharmD  [facility] []  Leodis Sias, PharmD []  Lindell Spar, PharmD []  Royetta Asal, PharmD []  Graylin Shiver, Rph []  Rema Fendt) Glennon Mac, PharmD []  Arlyn Dunning, PharmD []  Netta Cedars, PharmD []  Dia Sitter, PharmD []  Leone Haven, PharmD []  Gretta Arab, PharmD []  Theodis Shove, PharmD []  Peggyann Juba, PharmD []  Reuel Boom, PharmD   Positive urine culture Treated with Cephalexin, organism sensitive to the same and no further patient follow-up is required at this time.  Sandi Raveling Numa Heatwole 04/11/2019, 3:01 PM

## 2019-04-12 DIAGNOSIS — I10 Essential (primary) hypertension: Secondary | ICD-10-CM | POA: Diagnosis not present

## 2019-04-12 DIAGNOSIS — R05 Cough: Secondary | ICD-10-CM | POA: Diagnosis not present

## 2019-04-12 DIAGNOSIS — N390 Urinary tract infection, site not specified: Secondary | ICD-10-CM | POA: Diagnosis not present

## 2019-04-12 DIAGNOSIS — R278 Other lack of coordination: Secondary | ICD-10-CM | POA: Diagnosis not present

## 2019-04-12 DIAGNOSIS — R293 Abnormal posture: Secondary | ICD-10-CM | POA: Diagnosis not present

## 2019-04-12 DIAGNOSIS — J811 Chronic pulmonary edema: Secondary | ICD-10-CM | POA: Diagnosis not present

## 2019-04-12 DIAGNOSIS — I6930 Unspecified sequelae of cerebral infarction: Secondary | ICD-10-CM | POA: Diagnosis not present

## 2019-04-12 DIAGNOSIS — E119 Type 2 diabetes mellitus without complications: Secondary | ICD-10-CM | POA: Diagnosis not present

## 2019-04-12 DIAGNOSIS — F0390 Unspecified dementia without behavioral disturbance: Secondary | ICD-10-CM | POA: Diagnosis not present

## 2019-04-12 DIAGNOSIS — K8080 Other cholelithiasis without obstruction: Secondary | ICD-10-CM | POA: Diagnosis not present

## 2019-04-12 DIAGNOSIS — F331 Major depressive disorder, recurrent, moderate: Secondary | ICD-10-CM | POA: Diagnosis not present

## 2019-04-12 DIAGNOSIS — M6281 Muscle weakness (generalized): Secondary | ICD-10-CM | POA: Diagnosis not present

## 2019-04-12 DIAGNOSIS — R2689 Other abnormalities of gait and mobility: Secondary | ICD-10-CM | POA: Diagnosis not present

## 2019-04-12 DIAGNOSIS — R2681 Unsteadiness on feet: Secondary | ICD-10-CM | POA: Diagnosis not present

## 2019-04-14 DIAGNOSIS — R293 Abnormal posture: Secondary | ICD-10-CM | POA: Diagnosis not present

## 2019-04-14 DIAGNOSIS — M6281 Muscle weakness (generalized): Secondary | ICD-10-CM | POA: Diagnosis not present

## 2019-04-14 DIAGNOSIS — R2689 Other abnormalities of gait and mobility: Secondary | ICD-10-CM | POA: Diagnosis not present

## 2019-04-14 DIAGNOSIS — R278 Other lack of coordination: Secondary | ICD-10-CM | POA: Diagnosis not present

## 2019-04-14 DIAGNOSIS — R2681 Unsteadiness on feet: Secondary | ICD-10-CM | POA: Diagnosis not present

## 2019-04-16 DIAGNOSIS — R2681 Unsteadiness on feet: Secondary | ICD-10-CM | POA: Diagnosis not present

## 2019-04-16 DIAGNOSIS — M6281 Muscle weakness (generalized): Secondary | ICD-10-CM | POA: Diagnosis not present

## 2019-04-16 DIAGNOSIS — R293 Abnormal posture: Secondary | ICD-10-CM | POA: Diagnosis not present

## 2019-04-16 DIAGNOSIS — R278 Other lack of coordination: Secondary | ICD-10-CM | POA: Diagnosis not present

## 2019-04-16 DIAGNOSIS — R2689 Other abnormalities of gait and mobility: Secondary | ICD-10-CM | POA: Diagnosis not present

## 2019-04-19 DIAGNOSIS — I6930 Unspecified sequelae of cerebral infarction: Secondary | ICD-10-CM | POA: Diagnosis not present

## 2019-04-19 DIAGNOSIS — F0151 Vascular dementia with behavioral disturbance: Secondary | ICD-10-CM | POA: Diagnosis not present

## 2019-04-19 DIAGNOSIS — R2689 Other abnormalities of gait and mobility: Secondary | ICD-10-CM | POA: Diagnosis not present

## 2019-04-19 DIAGNOSIS — M1611 Unilateral primary osteoarthritis, right hip: Secondary | ICD-10-CM | POA: Diagnosis not present

## 2019-04-19 DIAGNOSIS — R2681 Unsteadiness on feet: Secondary | ICD-10-CM | POA: Diagnosis not present

## 2019-04-19 DIAGNOSIS — I10 Essential (primary) hypertension: Secondary | ICD-10-CM | POA: Diagnosis not present

## 2019-04-19 DIAGNOSIS — E119 Type 2 diabetes mellitus without complications: Secondary | ICD-10-CM | POA: Diagnosis not present

## 2019-04-19 DIAGNOSIS — F331 Major depressive disorder, recurrent, moderate: Secondary | ICD-10-CM | POA: Diagnosis not present

## 2019-04-21 DIAGNOSIS — R278 Other lack of coordination: Secondary | ICD-10-CM | POA: Diagnosis not present

## 2019-04-21 DIAGNOSIS — M6281 Muscle weakness (generalized): Secondary | ICD-10-CM | POA: Diagnosis not present

## 2019-04-21 DIAGNOSIS — R2681 Unsteadiness on feet: Secondary | ICD-10-CM | POA: Diagnosis not present

## 2019-04-21 DIAGNOSIS — R2689 Other abnormalities of gait and mobility: Secondary | ICD-10-CM | POA: Diagnosis not present

## 2019-04-21 DIAGNOSIS — R293 Abnormal posture: Secondary | ICD-10-CM | POA: Diagnosis not present

## 2019-04-23 DIAGNOSIS — R2689 Other abnormalities of gait and mobility: Secondary | ICD-10-CM | POA: Diagnosis not present

## 2019-04-23 DIAGNOSIS — M6281 Muscle weakness (generalized): Secondary | ICD-10-CM | POA: Diagnosis not present

## 2019-04-23 DIAGNOSIS — R293 Abnormal posture: Secondary | ICD-10-CM | POA: Diagnosis not present

## 2019-04-23 DIAGNOSIS — R2681 Unsteadiness on feet: Secondary | ICD-10-CM | POA: Diagnosis not present

## 2019-04-23 DIAGNOSIS — R278 Other lack of coordination: Secondary | ICD-10-CM | POA: Diagnosis not present

## 2019-04-25 ENCOUNTER — Emergency Department (HOSPITAL_COMMUNITY): Payer: Medicare Other

## 2019-04-25 ENCOUNTER — Emergency Department (HOSPITAL_COMMUNITY)
Admission: EM | Admit: 2019-04-25 | Discharge: 2019-04-25 | Disposition: A | Discharge status: Skilled Nursing Facility | Payer: Medicare Other | Attending: Emergency Medicine | Admitting: Emergency Medicine

## 2019-04-25 ENCOUNTER — Other Ambulatory Visit: Payer: Self-pay

## 2019-04-25 ENCOUNTER — Encounter (HOSPITAL_COMMUNITY): Payer: Self-pay

## 2019-04-25 DIAGNOSIS — F0390 Unspecified dementia without behavioral disturbance: Secondary | ICD-10-CM | POA: Insufficient documentation

## 2019-04-25 DIAGNOSIS — R4182 Altered mental status, unspecified: Secondary | ICD-10-CM | POA: Diagnosis not present

## 2019-04-25 DIAGNOSIS — R0602 Shortness of breath: Secondary | ICD-10-CM | POA: Diagnosis not present

## 2019-04-25 DIAGNOSIS — R197 Diarrhea, unspecified: Secondary | ICD-10-CM | POA: Diagnosis not present

## 2019-04-25 DIAGNOSIS — E1165 Type 2 diabetes mellitus with hyperglycemia: Secondary | ICD-10-CM | POA: Diagnosis not present

## 2019-04-25 DIAGNOSIS — Z7902 Long term (current) use of antithrombotics/antiplatelets: Secondary | ICD-10-CM | POA: Insufficient documentation

## 2019-04-25 DIAGNOSIS — F0391 Unspecified dementia with behavioral disturbance: Secondary | ICD-10-CM

## 2019-04-25 DIAGNOSIS — I10 Essential (primary) hypertension: Secondary | ICD-10-CM | POA: Diagnosis not present

## 2019-04-25 DIAGNOSIS — R000 Tachycardia, unspecified: Secondary | ICD-10-CM | POA: Diagnosis not present

## 2019-04-25 DIAGNOSIS — I959 Hypotension, unspecified: Secondary | ICD-10-CM | POA: Diagnosis not present

## 2019-04-25 DIAGNOSIS — E1122 Type 2 diabetes mellitus with diabetic chronic kidney disease: Secondary | ICD-10-CM | POA: Diagnosis not present

## 2019-04-25 DIAGNOSIS — Z7401 Bed confinement status: Secondary | ICD-10-CM | POA: Diagnosis not present

## 2019-04-25 DIAGNOSIS — M25551 Pain in right hip: Secondary | ICD-10-CM | POA: Insufficient documentation

## 2019-04-25 DIAGNOSIS — R0902 Hypoxemia: Secondary | ICD-10-CM | POA: Diagnosis not present

## 2019-04-25 DIAGNOSIS — Z794 Long term (current) use of insulin: Secondary | ICD-10-CM | POA: Insufficient documentation

## 2019-04-25 DIAGNOSIS — R531 Weakness: Secondary | ICD-10-CM | POA: Diagnosis not present

## 2019-04-25 DIAGNOSIS — N183 Chronic kidney disease, stage 3 unspecified: Secondary | ICD-10-CM | POA: Diagnosis not present

## 2019-04-25 DIAGNOSIS — R456 Violent behavior: Secondary | ICD-10-CM | POA: Diagnosis not present

## 2019-04-25 DIAGNOSIS — I129 Hypertensive chronic kidney disease with stage 1 through stage 4 chronic kidney disease, or unspecified chronic kidney disease: Secondary | ICD-10-CM | POA: Diagnosis not present

## 2019-04-25 DIAGNOSIS — M2550 Pain in unspecified joint: Secondary | ICD-10-CM | POA: Diagnosis not present

## 2019-04-25 DIAGNOSIS — S3993XA Unspecified injury of pelvis, initial encounter: Secondary | ICD-10-CM | POA: Diagnosis not present

## 2019-04-25 DIAGNOSIS — S299XXA Unspecified injury of thorax, initial encounter: Secondary | ICD-10-CM | POA: Diagnosis not present

## 2019-04-25 DIAGNOSIS — W19XXXA Unspecified fall, initial encounter: Secondary | ICD-10-CM

## 2019-04-25 DIAGNOSIS — Z79899 Other long term (current) drug therapy: Secondary | ICD-10-CM | POA: Insufficient documentation

## 2019-04-25 LAB — CBG MONITORING, ED: Glucose-Capillary: 174 mg/dL — ABNORMAL HIGH (ref 70–99)

## 2019-04-25 LAB — CBC WITH DIFFERENTIAL/PLATELET
Abs Immature Granulocytes: 0.02 10*3/uL (ref 0.00–0.07)
Basophils Absolute: 0 10*3/uL (ref 0.0–0.1)
Basophils Relative: 0 %
Eosinophils Absolute: 0.4 10*3/uL (ref 0.0–0.5)
Eosinophils Relative: 3 %
HCT: 33.6 % — ABNORMAL LOW (ref 39.0–52.0)
Hemoglobin: 10.4 g/dL — ABNORMAL LOW (ref 13.0–17.0)
Immature Granulocytes: 0 %
Lymphocytes Relative: 11 %
Lymphs Abs: 1.2 10*3/uL (ref 0.7–4.0)
MCH: 29.7 pg (ref 26.0–34.0)
MCHC: 31 g/dL (ref 30.0–36.0)
MCV: 96 fL (ref 80.0–100.0)
Monocytes Absolute: 0.8 10*3/uL (ref 0.1–1.0)
Monocytes Relative: 8 %
Neutro Abs: 8.4 10*3/uL — ABNORMAL HIGH (ref 1.7–7.7)
Neutrophils Relative %: 78 %
Platelets: 246 10*3/uL (ref 150–400)
RBC: 3.5 MIL/uL — ABNORMAL LOW (ref 4.22–5.81)
RDW: 14 % (ref 11.5–15.5)
WBC: 10.8 10*3/uL — ABNORMAL HIGH (ref 4.0–10.5)
nRBC: 0 % (ref 0.0–0.2)

## 2019-04-25 LAB — LACTIC ACID, PLASMA: Lactic Acid, Venous: 1.7 mmol/L (ref 0.5–1.9)

## 2019-04-25 LAB — COMPREHENSIVE METABOLIC PANEL
ALT: 15 U/L (ref 0–44)
AST: 20 U/L (ref 15–41)
Albumin: 3.5 g/dL (ref 3.5–5.0)
Alkaline Phosphatase: 116 U/L (ref 38–126)
Anion gap: 8 (ref 5–15)
BUN: 40 mg/dL — ABNORMAL HIGH (ref 8–23)
CO2: 25 mmol/L (ref 22–32)
Calcium: 8.7 mg/dL — ABNORMAL LOW (ref 8.9–10.3)
Chloride: 108 mmol/L (ref 98–111)
Creatinine, Ser: 1.81 mg/dL — ABNORMAL HIGH (ref 0.61–1.24)
GFR calc Af Amer: 40 mL/min — ABNORMAL LOW (ref >60)
GFR calc non Af Amer: 34 mL/min — ABNORMAL LOW (ref >60)
Glucose, Bld: 166 mg/dL — ABNORMAL HIGH (ref 70–99)
Potassium: 4.6 mmol/L (ref 3.5–5.1)
Sodium: 141 mmol/L (ref 135–145)
Total Bilirubin: 0.4 mg/dL (ref 0.3–1.2)
Total Protein: 6.3 g/dL — ABNORMAL LOW (ref 6.5–8.1)

## 2019-04-25 LAB — BLOOD GAS, VENOUS
Acid-Base Excess: 1.5 mmol/L (ref 0.0–2.0)
Bicarbonate: 27.1 mmol/L (ref 20.0–28.0)
O2 Saturation: 64.2 %
Patient temperature: 98.6
pCO2, Ven: 50.7 mmHg (ref 44.0–60.0)
pH, Ven: 7.348 (ref 7.250–7.430)
pO2, Ven: 36.6 mmHg (ref 32.0–45.0)

## 2019-04-25 LAB — LIPASE, BLOOD: Lipase: 22 U/L (ref 11–51)

## 2019-04-25 MED ORDER — HYDROCODONE-ACETAMINOPHEN 5-325 MG PO TABS: 1.0000 | ORAL_TABLET | Freq: Once | ORAL | Status: DC

## 2019-04-25 NOTE — ED Notes (Signed)
Patient unable to sign for discharge due to their dementia.
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
## 2019-04-25 NOTE — ED Triage Notes (Signed)
Patient is coming from [facility] with c/o generalized weakness and diarrhea started yesterday.

## 2019-04-25 NOTE — ED Notes (Addendum)
[facility] has been called for transport. [facility] updated about patient's discharge.

## 2019-04-26 DIAGNOSIS — E119 Type 2 diabetes mellitus without complications: Secondary | ICD-10-CM | POA: Diagnosis not present

## 2019-04-26 DIAGNOSIS — M6281 Muscle weakness (generalized): Secondary | ICD-10-CM | POA: Diagnosis not present

## 2019-04-26 DIAGNOSIS — M1611 Unilateral primary osteoarthritis, right hip: Secondary | ICD-10-CM | POA: Diagnosis not present

## 2019-04-26 DIAGNOSIS — I10 Essential (primary) hypertension: Secondary | ICD-10-CM | POA: Diagnosis not present

## 2019-04-26 DIAGNOSIS — F331 Major depressive disorder, recurrent, moderate: Secondary | ICD-10-CM | POA: Diagnosis not present

## 2019-04-26 DIAGNOSIS — R278 Other lack of coordination: Secondary | ICD-10-CM | POA: Diagnosis not present

## 2019-04-26 DIAGNOSIS — R293 Abnormal posture: Secondary | ICD-10-CM | POA: Diagnosis not present

## 2019-04-26 DIAGNOSIS — R2689 Other abnormalities of gait and mobility: Secondary | ICD-10-CM | POA: Diagnosis not present

## 2019-04-26 DIAGNOSIS — R2681 Unsteadiness on feet: Secondary | ICD-10-CM | POA: Diagnosis not present

## 2019-04-26 DIAGNOSIS — R197 Diarrhea, unspecified: Secondary | ICD-10-CM | POA: Diagnosis not present

## 2019-04-26 DIAGNOSIS — F0151 Vascular dementia with behavioral disturbance: Secondary | ICD-10-CM | POA: Diagnosis not present

## 2019-04-28 DIAGNOSIS — R2681 Unsteadiness on feet: Secondary | ICD-10-CM | POA: Diagnosis not present

## 2019-04-28 DIAGNOSIS — R2689 Other abnormalities of gait and mobility: Secondary | ICD-10-CM | POA: Diagnosis not present

## 2019-04-28 DIAGNOSIS — M6281 Muscle weakness (generalized): Secondary | ICD-10-CM | POA: Diagnosis not present

## 2019-04-28 DIAGNOSIS — R278 Other lack of coordination: Secondary | ICD-10-CM | POA: Diagnosis not present

## 2019-04-28 DIAGNOSIS — R293 Abnormal posture: Secondary | ICD-10-CM | POA: Diagnosis not present

## 2019-04-29 DIAGNOSIS — R2689 Other abnormalities of gait and mobility: Secondary | ICD-10-CM | POA: Diagnosis not present

## 2019-04-29 DIAGNOSIS — R2681 Unsteadiness on feet: Secondary | ICD-10-CM | POA: Diagnosis not present

## 2019-04-30 DIAGNOSIS — R278 Other lack of coordination: Secondary | ICD-10-CM | POA: Diagnosis not present

## 2019-04-30 DIAGNOSIS — M6281 Muscle weakness (generalized): Secondary | ICD-10-CM | POA: Diagnosis not present

## 2019-04-30 DIAGNOSIS — R293 Abnormal posture: Secondary | ICD-10-CM | POA: Diagnosis not present

## 2019-04-30 DIAGNOSIS — M25551 Pain in right hip: Secondary | ICD-10-CM | POA: Diagnosis not present

## 2019-05-01 ENCOUNTER — Other Ambulatory Visit: Payer: Self-pay

## 2019-05-01 ENCOUNTER — Emergency Department (HOSPITAL_COMMUNITY)
Admission: EM | Admit: 2019-05-01 | Discharge: 2019-05-01 | Disposition: A | Discharge status: Home / Self Care | Payer: Medicare Other | Attending: Emergency Medicine | Admitting: Emergency Medicine

## 2019-05-01 ENCOUNTER — Emergency Department (HOSPITAL_COMMUNITY): Payer: Medicare Other

## 2019-05-01 DIAGNOSIS — G44309 Post-traumatic headache, unspecified, not intractable: Secondary | ICD-10-CM | POA: Diagnosis not present

## 2019-05-01 DIAGNOSIS — M2550 Pain in unspecified joint: Secondary | ICD-10-CM | POA: Diagnosis not present

## 2019-05-01 DIAGNOSIS — Z7902 Long term (current) use of antithrombotics/antiplatelets: Secondary | ICD-10-CM | POA: Insufficient documentation

## 2019-05-01 DIAGNOSIS — R1012 Left upper quadrant pain: Secondary | ICD-10-CM | POA: Diagnosis not present

## 2019-05-01 DIAGNOSIS — Y999 Unspecified external cause status: Secondary | ICD-10-CM | POA: Insufficient documentation

## 2019-05-01 DIAGNOSIS — N179 Acute kidney failure, unspecified: Secondary | ICD-10-CM | POA: Diagnosis not present

## 2019-05-01 DIAGNOSIS — S0003XA Contusion of scalp, initial encounter: Secondary | ICD-10-CM | POA: Insufficient documentation

## 2019-05-01 DIAGNOSIS — Y9389 Activity, other specified: Secondary | ICD-10-CM | POA: Insufficient documentation

## 2019-05-01 DIAGNOSIS — I129 Hypertensive chronic kidney disease with stage 1 through stage 4 chronic kidney disease, or unspecified chronic kidney disease: Secondary | ICD-10-CM | POA: Insufficient documentation

## 2019-05-01 DIAGNOSIS — Y92129 Unspecified place in nursing home as the place of occurrence of the external cause: Secondary | ICD-10-CM | POA: Diagnosis not present

## 2019-05-01 DIAGNOSIS — S299XXA Unspecified injury of thorax, initial encounter: Secondary | ICD-10-CM | POA: Diagnosis not present

## 2019-05-01 DIAGNOSIS — E1122 Type 2 diabetes mellitus with diabetic chronic kidney disease: Secondary | ICD-10-CM | POA: Diagnosis not present

## 2019-05-01 DIAGNOSIS — Z7401 Bed confinement status: Secondary | ICD-10-CM | POA: Diagnosis not present

## 2019-05-01 DIAGNOSIS — R296 Repeated falls: Secondary | ICD-10-CM

## 2019-05-01 DIAGNOSIS — S3991XA Unspecified injury of abdomen, initial encounter: Secondary | ICD-10-CM | POA: Diagnosis not present

## 2019-05-01 DIAGNOSIS — N189 Chronic kidney disease, unspecified: Secondary | ICD-10-CM | POA: Diagnosis not present

## 2019-05-01 DIAGNOSIS — R0902 Hypoxemia: Secondary | ICD-10-CM | POA: Diagnosis not present

## 2019-05-01 DIAGNOSIS — R402411 Glasgow coma scale score 13-15, in the field [EMT or ambulance]: Secondary | ICD-10-CM | POA: Diagnosis not present

## 2019-05-01 DIAGNOSIS — S0990XA Unspecified injury of head, initial encounter: Secondary | ICD-10-CM

## 2019-05-01 DIAGNOSIS — Z79899 Other long term (current) drug therapy: Secondary | ICD-10-CM | POA: Insufficient documentation

## 2019-05-01 DIAGNOSIS — W19XXXA Unspecified fall, initial encounter: Secondary | ICD-10-CM | POA: Diagnosis not present

## 2019-05-01 DIAGNOSIS — Z794 Long term (current) use of insulin: Secondary | ICD-10-CM | POA: Insufficient documentation

## 2019-05-01 DIAGNOSIS — S199XXA Unspecified injury of neck, initial encounter: Secondary | ICD-10-CM | POA: Diagnosis not present

## 2019-05-01 DIAGNOSIS — I959 Hypotension, unspecified: Secondary | ICD-10-CM | POA: Diagnosis not present

## 2019-05-01 LAB — URINALYSIS, ROUTINE W REFLEX MICROSCOPIC
Bilirubin Urine: NEGATIVE
Glucose, UA: NEGATIVE mg/dL
Hgb urine dipstick: NEGATIVE
Ketones, ur: NEGATIVE mg/dL
Leukocytes,Ua: NEGATIVE
Nitrite: NEGATIVE
Protein, ur: NEGATIVE mg/dL
Specific Gravity, Urine: 1.012 (ref 1.005–1.030)
pH: 5 (ref 5.0–8.0)

## 2019-05-01 LAB — COMPREHENSIVE METABOLIC PANEL
ALT: 16 U/L (ref 0–44)
AST: 22 U/L (ref 15–41)
Albumin: 3.5 g/dL (ref 3.5–5.0)
Alkaline Phosphatase: 128 U/L — ABNORMAL HIGH (ref 38–126)
Anion gap: 10 (ref 5–15)
BUN: 46 mg/dL — ABNORMAL HIGH (ref 8–23)
CO2: 28 mmol/L (ref 22–32)
Calcium: 9.1 mg/dL (ref 8.9–10.3)
Chloride: 104 mmol/L (ref 98–111)
Creatinine, Ser: 2.12 mg/dL — ABNORMAL HIGH (ref 0.61–1.24)
GFR calc Af Amer: 33 mL/min — ABNORMAL LOW (ref >60)
GFR calc non Af Amer: 28 mL/min — ABNORMAL LOW (ref >60)
Glucose, Bld: 111 mg/dL — ABNORMAL HIGH (ref 70–99)
Potassium: 4.6 mmol/L (ref 3.5–5.1)
Sodium: 142 mmol/L (ref 135–145)
Total Bilirubin: 0.9 mg/dL (ref 0.3–1.2)
Total Protein: 6.7 g/dL (ref 6.5–8.1)

## 2019-05-01 LAB — CBC
HCT: 35.7 % — ABNORMAL LOW (ref 39.0–52.0)
Hemoglobin: 10.9 g/dL — ABNORMAL LOW (ref 13.0–17.0)
MCH: 29.3 pg (ref 26.0–34.0)
MCHC: 30.5 g/dL (ref 30.0–36.0)
MCV: 96 fL (ref 80.0–100.0)
Platelets: 262 10*3/uL (ref 150–400)
RBC: 3.72 MIL/uL — ABNORMAL LOW (ref 4.22–5.81)
RDW: 13.7 % (ref 11.5–15.5)
WBC: 8.5 10*3/uL (ref 4.0–10.5)
nRBC: 0 % (ref 0.0–0.2)

## 2019-05-01 LAB — CBG MONITORING, ED: Glucose-Capillary: 101 mg/dL — ABNORMAL HIGH (ref 70–99)

## 2019-05-01 LAB — LIPASE, BLOOD: Lipase: 19 U/L (ref 11–51)

## 2019-05-01 MED ORDER — SODIUM CHLORIDE 0.9 % IV BOLUS (SEPSIS)
500.0000 mL | Freq: Once | INTRAVENOUS | Status: AC
  Administered 2019-05-01: 08:00:00 500 mL via INTRAVENOUS

## 2019-05-01 MED ORDER — SODIUM CHLORIDE 0.9 % IV SOLN: Freq: Once | INTRAVENOUS | Status: AC

## 2019-05-01 NOTE — ED Triage Notes (Signed)
As Per EMS pt had unwitnessed fall. Pt has left side weakness from old CVA

## 2019-05-01 NOTE — ED Notes (Signed)
[facility] is been call for patient transport to [facility].

## 2019-05-01 NOTE — Discharge Instructions (Addendum)
Patient was seen here after a fall.  CT of the head, cervical spine, abdomen and pelvis showed no acute abnormality.  Chest x-ray was also normal.  Labs did show worsening renal function over the past month.  Creatinine today was 2.12.  Hospitalist was consulted who recommended hydration here and outpatient follow-up.  They recommend close follow-up with the physician in [place].  They recommend increase water intake.  Primary doctor may want to hold any nephrotoxic medications.  Otherwise labs appeared at baseline and were reassuring.  Urine showed no sign of infection.

## 2019-05-01 NOTE — ED Notes (Signed)
[facility] call to make them aware of patient coming back to the facility. Report given.

## 2019-05-01 NOTE — ED Provider Notes (Signed)
TIME SEEN: 5:15 AM  CHIEF COMPLAINT: Unwitnessed fall  HPI: Patient is an 84 year old male with history of stroke, hypertension, hyperlipidemia, dementia, chronic right hip pain who presents to the emergency department with EMS from [facility].  Spoke with med tech at [facility] who reports that he was found on the floor sitting upright.  He was conscious.  Was last seen about an hour prior to this.  He is wheelchair-bound at baseline.  He is on Plavix.  They were concerned because he had a "bump to the top of his head".  ROS: Level 5 caveat secondary to dementia  PAST MEDICAL HISTORY/PAST SURGICAL HISTORY:  Past Medical History:  Diagnosis Date  . Arthritis   . Chronic right hip pain   . Colon polyp   . Dementia ([place])   . Depression   . Diverticulosis   . Hyperlipemia   . Hypertension   . Pneumonia X 2  . Stroke ([place]) 2010-2016    X 4,  "right side weak since; difficulty walking, speaking, read, eat" (06/06/2015)  . Type II diabetes mellitus (HCC)     MEDICATIONS:  Prior to Admission medications   Medication Sig Start Date End Date Taking? Authorizing Provider  acetaminophen (TYLENOL) 500 MG tablet Take 500 mg by mouth See admin instructions. Take 500 mg by mouth every 4-6 hours as needed for pain    [provider]  amLODipine (NORVASC) 5 MG tablet Take 1 tablet (5 mg total) by mouth daily. 01/26/19   Hosie Poisson, MD  Ascorbic Acid (VITAMIN C) 1000 MG tablet Take 1 tablet (1,000 mg total) by mouth daily. 11/07/17   Angiulli, Lavon Paganini, PA-C  aspirin EC 81 MG tablet Take 81 mg by mouth daily.    [provider]  atorvastatin (LIPITOR) 40 MG tablet Take 1 tablet (40 mg total) by mouth daily at 6 PM. 01/25/19   Hosie Poisson, MD  cephALEXin (KEFLEX) 500 MG capsule Take 1 capsule (500 mg total) by mouth 2 (two) times daily. Patient not taking: Reported on 04/25/2019 04/08/19   Ripley Fraise, MD  chlorhexidine (PERIDEX) 0.12 % solution Use as directed 15 mLs  in the mouth or throat See admin instructions. Swish and spit 10 ml's two times a day    [provider]  citalopram (CELEXA) 10 MG tablet Take 1 tablet (10 mg total) by mouth daily. 11/07/17   Angiulli, Lavon Paganini, PA-C  clopidogrel (PLAVIX) 75 MG tablet Take 1 tablet (75 mg total) by mouth daily. 11/07/17   Angiulli, Lavon Paganini, PA-C  donepezil (ARICEPT) 5 MG tablet Take 5 mg by mouth at bedtime.    [provider]  furosemide (LASIX) 40 MG tablet Take 40 mg by mouth daily.    [provider]  gabapentin (NEURONTIN) 300 MG capsule Take 300 mg by mouth 3 (three) times daily. 11/12/18   [provider]  HYDROcodone-acetaminophen (NORCO/VICODIN) 5-325 MG tablet Take 1 tablet by mouth every 4 (four) hours as needed. Patient taking differently: Take 1 tablet by mouth every 6 (six) hours as needed (for pain).  02/03/19   Tedd Sias, PA  Insulin Glargine (BASAGLAR KWIKPEN) 100 UNIT/ML Inject 10 Units into the skin 2 (two) times daily.    [provider]  insulin glargine (LANTUS) 100 UNIT/ML injection Inject 0.05 mLs (5 Units total) into the skin 2 (two) times daily. Patient not taking: Reported on 04/07/2019 01/25/19   Hosie Poisson, MD  insulin regular (NOVOLIN R) 100 units/mL injection Inject  5 Units into the skin daily with breakfast.     [provider]  isosorbide mononitrate (IMDUR) 30 MG 24 hr tablet Take 1 tablet (30 mg total) by mouth daily. 01/26/19   Hosie Poisson, MD  lisinopril (ZESTRIL) 10 MG tablet TAKE 1 TABLET BY MOUTH DAILY Patient taking differently: Take 10 mg by mouth daily.  10/12/18   Miquel Dunn, NP  LORazepam (ATIVAN) 0.5 MG tablet Take 1 tablet (0.5 mg total) by mouth 3 (three) times daily as needed for anxiety. Patient taking differently: Take 0.5 mg by mouth 2 (two) times daily.  01/25/19   Hosie Poisson, MD  Melatonin 3 MG TABS Take 1 tablet by mouth daily as needed (for sleep).     [provider]   Multiple Vitamin (DAILY-VITE PO) Take 1 tablet by mouth in the morning.    [provider]  OLANZapine (ZYPREXA) 2.5 MG tablet Take 2.5 mg by mouth every evening.    [provider]  ondansetron (ZOFRAN ODT) 4 MG disintegrating tablet 4mg  ODT q4 hours prn nausea/vomit Patient taking differently: Take 4 mg by mouth every 4 (four) hours as needed for nausea or vomiting (DISSOLVE IN THE MOUTH).  12/01/18   Milton Ferguson, MD  oxyCODONE (OXY IR/ROXICODONE) 5 MG immediate release tablet Take 5 mg by mouth every 6 (six) hours as needed for breakthrough pain.    [provider]  potassium chloride (KLOR-CON) 10 MEQ tablet Take 10 mEq by mouth daily.    [provider]  tamsulosin (FLOMAX) 0.4 MG CAPS capsule Take 0.4 mg by mouth at bedtime.    [provider]  tiZANidine (ZANAFLEX) 2 MG tablet Take 2 mg by mouth 3 (three) times daily as needed for muscle spasms.    [provider]    ALLERGIES:  No Known Allergies  SOCIAL HISTORY:  Social History   Tobacco Use  . Smoking status: Never Smoker  . Smokeless tobacco: Never Used  Substance Use Topics  . Alcohol use: No    FAMILY HISTORY: Family History  Problem Relation Age of Onset  . Prostate cancer Brother   . Diabetes Daughter   . Colon cancer Neg Hx     EXAM: BP 118/73 (BP Location: Right Arm)   Pulse (!) 58   Temp 98.5 F (36.9 C) (Oral)   Resp 18   Ht 5\' 10"  (1.778 m)   Wt 81.6 kg   SpO2 97%   BMI 25.81 kg/m  CONSTITUTIONAL: Alert and oriented to person.  Unable to answer questions appropriately.  Demented. HEAD: Normocephalic, small hematoma and abrasion to the superior scalp without laceration EYES: Conjunctivae clear, pupils appear equal, EOM appear intact ENT: normal nose; moist mucous membranes, no dental injury appreciated, no septal hematoma NECK: Supple, normal ROM, no midline cervical spine tenderness or step-off or deformity, and cervical collar CARD: RRR; S1  and S2 appreciated; no murmurs, no clicks, no rubs, no gallops RESP: Normal chest excursion without splinting or tachypnea; breath sounds clear and equal bilaterally; no wheezes, no rhonchi, no rales, no hypoxia or respiratory distress, speaking full sentences CHEST: No ecchymosis, soft tissue swelling.  No crepitus.  I do not appreciate any significant rib tenderness on exam but he is tender over the left upper quadrant.  No flail chest. ABD/GI: Normal bowel sounds; non-distended; soft, patient is tender in the left upper quadrant without ecchymosis, soft tissue swelling.  He begins to groan when I press this area. BACK:  The back appears  normal, no midline spinal tenderness or step-off or deformity EXT: Normal ROM in all joints; no deformity noted, no edema; no cyanosis, no leg length discrepancy, hips are nontender to palpation and stable, 2+ radial and DP pulses bilaterally, extremities are nontender to palpation SKIN: Normal color for age and race; warm; no rash on exposed skin NEURO: Chronic left-sided weakness, demented, speech seems clear, unable to test sensation, does not follow all commands PSYCH: The patient's mood and manner are appropriate.  Currently calm.  MEDICAL DECISION MAKING: Patient here with unwitnessed fall.  Unclear at this time why the patient fell.  Sounds like he is wheelchair-bound at baseline and was found outside of the bed.  Will obtain labs, urine and imaging to evaluate for any traumatic injury.  He appears comfortable except when being palpated.  Will hold on pain medication at this time.  No lacerations that need repair.  ED PROGRESS: Patient's labs appear to show worsening renal function since January.  Creatinine in December and January was between 1.2 and 1.5 and today is 2.12.  Will begin hydrating patient.  Have discussed this with patient's son-in-law, Octavia Bruckner who is listed as his emergency contact.  He would prefer the patient be admitted to the hospital for  treatment for acute on chronic kidney disease.  He was not aware of this worsening renal function.  Urine shows no sign of infection.  Otherwise labs show no acute abnormality.  CT is pending.  Will admit to medicine once CTs have resulted.  Covid test has been ordered for admission.  Patient was Covid positive in November 2020.  7:20 AM  Pt's CT scan showed no sign of acute injury from trauma.  He does have cholelithiasis without cholecystitis.  Has diverticulosis without diverticulitis.  CT head and cervical spine show chronic changes but no acute abnormality.  Imaging has been independently interpreted and reviewed by myself.  Given patient's worsening renal function, will discuss with medicine for admission.  He is receiving gentle IV hydration.  7:35 AM  Spoke with Dr. Quincy Simmonds with hospitalist service.  He has reviewed patient's work-up here and does not feel the patient needs admission at this time and this can be done as an outpatient.  Will give 500 mL of IV fluids here and then discharged back to the nursing facility.  Will update patient's son-in-law by phone.  At this time, I do not feel there is any life-threatening condition present. I have reviewed, interpreted and discussed all results (EKG, imaging, lab, urine as appropriate) and exam findings with patient/family. I have reviewed nursing notes and appropriate previous records.  I feel the patient is safe to be discharged home without further emergent workup and can continue workup as an outpatient as needed. Discussed usual and customary return precautions. Patient/family verbalize understanding and are comfortable with this plan.  Outpatient follow-up has been provided as needed. All questions have been answered.   EKG Interpretation  Date/Time:  Saturday May 01 2019 05:53:15 EDT Ventricular Rate:  56 PR Interval:    QRS Duration: 116 QT Interval:  451 QTC Calculation: 436 R Axis:   38 Text Interpretation: Sinus rhythm  Nonspecific intraventricular conduction delay Inferior infarct, old No significant change since last tracing Confirmed by Pryor Curia [id number]) on 05/01/2019 5:55:16 AM         [name] was evaluated in Emergency Department on 05/01/2019 for the symptoms described in the history of present illness. He was evaluated in the context of the global  COVID-19 pandemic, which necessitated consideration that the patient might be at risk for infection with the SARS-CoV-2 virus that causes COVID-19. Institutional protocols and algorithms that pertain to the evaluation of patients at risk for COVID-19 are in a state of rapid change based on information released by regulatory bodies including the CDC and federal and state organizations. These policies and algorithms were followed during the patient's care in the ED.      Jachin Coury, Delice Bison, DO 05/01/19 [phone]

## 2019-05-03 DIAGNOSIS — R2689 Other abnormalities of gait and mobility: Secondary | ICD-10-CM | POA: Diagnosis not present

## 2019-05-03 DIAGNOSIS — R278 Other lack of coordination: Secondary | ICD-10-CM | POA: Diagnosis not present

## 2019-05-03 DIAGNOSIS — R293 Abnormal posture: Secondary | ICD-10-CM | POA: Diagnosis not present

## 2019-05-03 DIAGNOSIS — M6281 Muscle weakness (generalized): Secondary | ICD-10-CM | POA: Diagnosis not present

## 2019-05-03 DIAGNOSIS — R2681 Unsteadiness on feet: Secondary | ICD-10-CM | POA: Diagnosis not present

## 2019-05-05 DIAGNOSIS — R278 Other lack of coordination: Secondary | ICD-10-CM | POA: Diagnosis not present

## 2019-05-05 DIAGNOSIS — R2689 Other abnormalities of gait and mobility: Secondary | ICD-10-CM | POA: Diagnosis not present

## 2019-05-05 DIAGNOSIS — M6281 Muscle weakness (generalized): Secondary | ICD-10-CM | POA: Diagnosis not present

## 2019-05-05 DIAGNOSIS — R2681 Unsteadiness on feet: Secondary | ICD-10-CM | POA: Diagnosis not present

## 2019-05-05 DIAGNOSIS — R293 Abnormal posture: Secondary | ICD-10-CM | POA: Diagnosis not present

## 2019-05-06 ENCOUNTER — Emergency Department (HOSPITAL_COMMUNITY): Payer: Medicare Other

## 2019-05-06 ENCOUNTER — Emergency Department (HOSPITAL_COMMUNITY)
Admission: EM | Admit: 2019-05-06 | Discharge: 2019-05-07 | Disposition: A | Discharge status: Home / Self Care | Payer: Medicare Other | Attending: Emergency Medicine | Admitting: Emergency Medicine

## 2019-05-06 ENCOUNTER — Other Ambulatory Visit: Payer: Self-pay

## 2019-05-06 DIAGNOSIS — Y939 Activity, unspecified: Secondary | ICD-10-CM | POA: Insufficient documentation

## 2019-05-06 DIAGNOSIS — Z794 Long term (current) use of insulin: Secondary | ICD-10-CM | POA: Diagnosis not present

## 2019-05-06 DIAGNOSIS — N183 Chronic kidney disease, stage 3 unspecified: Secondary | ICD-10-CM | POA: Insufficient documentation

## 2019-05-06 DIAGNOSIS — I10 Essential (primary) hypertension: Secondary | ICD-10-CM | POA: Diagnosis not present

## 2019-05-06 DIAGNOSIS — R0902 Hypoxemia: Secondary | ICD-10-CM | POA: Diagnosis not present

## 2019-05-06 DIAGNOSIS — Z23 Encounter for immunization: Secondary | ICD-10-CM | POA: Diagnosis not present

## 2019-05-06 DIAGNOSIS — S2232XA Fracture of one rib, left side, initial encounter for closed fracture: Secondary | ICD-10-CM | POA: Insufficient documentation

## 2019-05-06 DIAGNOSIS — S0083XA Contusion of other part of head, initial encounter: Secondary | ICD-10-CM | POA: Diagnosis not present

## 2019-05-06 DIAGNOSIS — Z79899 Other long term (current) drug therapy: Secondary | ICD-10-CM | POA: Insufficient documentation

## 2019-05-06 DIAGNOSIS — W19XXXA Unspecified fall, initial encounter: Secondary | ICD-10-CM | POA: Diagnosis not present

## 2019-05-06 DIAGNOSIS — F0390 Unspecified dementia without behavioral disturbance: Secondary | ICD-10-CM | POA: Diagnosis not present

## 2019-05-06 DIAGNOSIS — Z20822 Contact with and (suspected) exposure to covid-19: Secondary | ICD-10-CM | POA: Insufficient documentation

## 2019-05-06 DIAGNOSIS — E1122 Type 2 diabetes mellitus with diabetic chronic kidney disease: Secondary | ICD-10-CM | POA: Diagnosis not present

## 2019-05-06 DIAGNOSIS — R519 Headache, unspecified: Secondary | ICD-10-CM | POA: Diagnosis not present

## 2019-05-06 DIAGNOSIS — Z7901 Long term (current) use of anticoagulants: Secondary | ICD-10-CM | POA: Diagnosis not present

## 2019-05-06 DIAGNOSIS — M542 Cervicalgia: Secondary | ICD-10-CM | POA: Diagnosis not present

## 2019-05-06 DIAGNOSIS — S0011XA Contusion of right eyelid and periocular area, initial encounter: Secondary | ICD-10-CM | POA: Diagnosis not present

## 2019-05-06 DIAGNOSIS — R404 Transient alteration of awareness: Secondary | ICD-10-CM | POA: Diagnosis not present

## 2019-05-06 DIAGNOSIS — S2241XA Multiple fractures of ribs, right side, initial encounter for closed fracture: Secondary | ICD-10-CM | POA: Diagnosis not present

## 2019-05-06 DIAGNOSIS — Y92129 Unspecified place in nursing home as the place of occurrence of the external cause: Secondary | ICD-10-CM | POA: Diagnosis not present

## 2019-05-06 DIAGNOSIS — T07XXXA Unspecified multiple injuries, initial encounter: Secondary | ICD-10-CM | POA: Diagnosis present

## 2019-05-06 DIAGNOSIS — I129 Hypertensive chronic kidney disease with stage 1 through stage 4 chronic kidney disease, or unspecified chronic kidney disease: Secondary | ICD-10-CM | POA: Diagnosis not present

## 2019-05-06 DIAGNOSIS — R52 Pain, unspecified: Secondary | ICD-10-CM | POA: Diagnosis not present

## 2019-05-06 DIAGNOSIS — Y999 Unspecified external cause status: Secondary | ICD-10-CM | POA: Diagnosis not present

## 2019-05-06 DIAGNOSIS — W1830XA Fall on same level, unspecified, initial encounter: Secondary | ICD-10-CM | POA: Insufficient documentation

## 2019-05-06 DIAGNOSIS — S0990XA Unspecified injury of head, initial encounter: Secondary | ICD-10-CM | POA: Diagnosis not present

## 2019-05-06 DIAGNOSIS — R609 Edema, unspecified: Secondary | ICD-10-CM | POA: Diagnosis not present

## 2019-05-06 LAB — CBC WITH DIFFERENTIAL/PLATELET
Abs Immature Granulocytes: 0.04 10*3/uL (ref 0.00–0.07)
Basophils Absolute: 0 10*3/uL (ref 0.0–0.1)
Basophils Relative: 0 %
Eosinophils Absolute: 0.8 10*3/uL — ABNORMAL HIGH (ref 0.0–0.5)
Eosinophils Relative: 8 %
HCT: 35.3 % — ABNORMAL LOW (ref 39.0–52.0)
Hemoglobin: 10.7 g/dL — ABNORMAL LOW (ref 13.0–17.0)
Immature Granulocytes: 0 %
Lymphocytes Relative: 13 %
Lymphs Abs: 1.3 10*3/uL (ref 0.7–4.0)
MCH: 29.5 pg (ref 26.0–34.0)
MCHC: 30.3 g/dL (ref 30.0–36.0)
MCV: 97.2 fL (ref 80.0–100.0)
Monocytes Absolute: 0.9 10*3/uL (ref 0.1–1.0)
Monocytes Relative: 9 %
Neutro Abs: 6.9 10*3/uL (ref 1.7–7.7)
Neutrophils Relative %: 70 %
Platelets: 270 10*3/uL (ref 150–400)
RBC: 3.63 MIL/uL — ABNORMAL LOW (ref 4.22–5.81)
RDW: 13.7 % (ref 11.5–15.5)
WBC: 10 10*3/uL (ref 4.0–10.5)
nRBC: 0 % (ref 0.0–0.2)

## 2019-05-06 LAB — BASIC METABOLIC PANEL
Anion gap: 11 (ref 5–15)
BUN: 28 mg/dL — ABNORMAL HIGH (ref 8–23)
CO2: 26 mmol/L (ref 22–32)
Calcium: 8.9 mg/dL (ref 8.9–10.3)
Chloride: 105 mmol/L (ref 98–111)
Creatinine, Ser: 1.69 mg/dL — ABNORMAL HIGH (ref 0.61–1.24)
GFR calc Af Amer: 43 mL/min — ABNORMAL LOW (ref >60)
GFR calc non Af Amer: 37 mL/min — ABNORMAL LOW (ref >60)
Glucose, Bld: 84 mg/dL (ref 70–99)
Potassium: 4.3 mmol/L (ref 3.5–5.1)
Sodium: 142 mmol/L (ref 135–145)

## 2019-05-06 LAB — CBG MONITORING, ED
Glucose-Capillary: 95 mg/dL (ref 70–99)
Glucose-Capillary: 77 mg/dL (ref 70–99)
Glucose-Capillary: 151 mg/dL — ABNORMAL HIGH (ref 70–99)
Glucose-Capillary: 188 mg/dL — ABNORMAL HIGH (ref 70–99)

## 2019-05-06 MED ORDER — HYDROCODONE-ACETAMINOPHEN 5-325 MG PO TABS
1.0000 | ORAL_TABLET | Freq: Once | ORAL | Status: AC
  Administered 2019-05-06: 13:00:00 1 via ORAL
  Filled 2019-05-06: qty 1

## 2019-05-06 MED ORDER — CLOPIDOGREL BISULFATE 75 MG PO TABS
75.0000 mg | ORAL_TABLET | Freq: Every day | ORAL | Status: DC
  Administered 2019-05-06 – 2019-05-07 (×2): 75 mg via ORAL
  Filled 2019-05-06 (×2): qty 1

## 2019-05-06 MED ORDER — LISINOPRIL 10 MG PO TABS
10.0000 mg | ORAL_TABLET | Freq: Every day | ORAL | Status: DC
  Administered 2019-05-07: 10 mg via ORAL
  Filled 2019-05-06: qty 1

## 2019-05-06 MED ORDER — OLANZAPINE 2.5 MG PO TABS
2.5000 mg | ORAL_TABLET | Freq: Every evening | ORAL | Status: DC
  Filled 2019-05-06 (×2): qty 1

## 2019-05-06 MED ORDER — MELATONIN 3 MG PO TABS
3.0000 mg | ORAL_TABLET | Freq: Every day | ORAL | Status: DC | PRN
  Administered 2019-05-06: 3 mg via ORAL
  Filled 2019-05-06: qty 1

## 2019-05-06 MED ORDER — CITALOPRAM HYDROBROMIDE 10 MG PO TABS
10.0000 mg | ORAL_TABLET | Freq: Every day | ORAL | Status: DC
  Administered 2019-05-07: 15:00:00 10 mg via ORAL
  Filled 2019-05-06: qty 1

## 2019-05-06 MED ORDER — ASPIRIN EC 81 MG PO TBEC
81.0000 mg | DELAYED_RELEASE_TABLET | Freq: Every day | ORAL | Status: DC
  Administered 2019-05-06 – 2019-05-07 (×2): 81 mg via ORAL
  Filled 2019-05-06 (×2): qty 1

## 2019-05-06 MED ORDER — FUROSEMIDE 20 MG PO TABS
40.0000 mg | ORAL_TABLET | Freq: Every day | ORAL | Status: DC
  Administered 2019-05-06 – 2019-05-07 (×2): 40 mg via ORAL
  Filled 2019-05-06 (×2): qty 2

## 2019-05-06 MED ORDER — GABAPENTIN 100 MG PO CAPS
300.0000 mg | ORAL_CAPSULE | Freq: Three times a day (TID) | ORAL | Status: DC
  Administered 2019-05-07: 15:00:00 300 mg via ORAL
  Filled 2019-05-06: qty 3

## 2019-05-06 MED ORDER — ISOSORBIDE MONONITRATE ER 30 MG PO TB24
30.0000 mg | ORAL_TABLET | Freq: Every day | ORAL | Status: DC
  Administered 2019-05-07: 30 mg via ORAL
  Filled 2019-05-06: qty 1

## 2019-05-06 MED ORDER — POTASSIUM CHLORIDE CRYS ER 20 MEQ PO TBCR
10.0000 meq | EXTENDED_RELEASE_TABLET | Freq: Every day | ORAL | Status: DC
  Administered 2019-05-07: 15:00:00 10 meq via ORAL
  Filled 2019-05-06: qty 1

## 2019-05-06 MED ORDER — INSULIN GLARGINE 100 UNIT/ML ~~LOC~~ SOLN
10.0000 [IU] | Freq: Two times a day (BID) | SUBCUTANEOUS | Status: DC
  Administered 2019-05-06 – 2019-05-07 (×2): 10 [IU] via SUBCUTANEOUS
  Filled 2019-05-06 (×3): qty 0.1

## 2019-05-06 MED ORDER — ATORVASTATIN CALCIUM 40 MG PO TABS
40.0000 mg | ORAL_TABLET | Freq: Every day | ORAL | Status: DC
  Administered 2019-05-07: 40 mg via ORAL
  Filled 2019-05-06: qty 1

## 2019-05-06 MED ORDER — TETANUS-DIPHTH-ACELL PERTUSSIS 5-2.5-18.5 LF-MCG/0.5 IM SUSP
0.5000 mL | Freq: Once | INTRAMUSCULAR | Status: AC
  Administered 2019-05-06: 0.5 mL via INTRAMUSCULAR
  Filled 2019-05-06: qty 0.5

## 2019-05-06 MED ORDER — TAMSULOSIN HCL 0.4 MG PO CAPS: 0.4000 mg | ORAL_CAPSULE | Freq: Every day | ORAL | Status: DC

## 2019-05-06 MED ORDER — AMLODIPINE BESYLATE 5 MG PO TABS
5.0000 mg | ORAL_TABLET | Freq: Every day | ORAL | Status: DC
  Filled 2019-05-06 (×2): qty 1

## 2019-05-06 NOTE — ED Notes (Signed)
Report called to [facility]. Report given to nursing supervisor, Cindy,RN, and as per Jenny Reichmann; they do not feel equipped to care for Casey Collins s/p Fall with right rib Fx.CM/SW made aware and will follow up.

## 2019-05-06 NOTE — Progress Notes (Signed)
Spoke to MD about ordering Home medications

## 2019-05-06 NOTE — Discharge Instructions (Addendum)
Keep the facial wounds clean and dry.  You can use ice for the swelling.  You do have a rib fracture on the left.  You can use your home pain medications for this.  Return to the emergency room for any worsening symptoms.
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
## 2019-05-06 NOTE — ED Triage Notes (Signed)
Patient taking Plavix

## 2019-05-06 NOTE — ED Notes (Signed)
[facility] called/Canceled @ [facility], arts called by Levada Dy
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
## 2019-05-06 NOTE — Evaluation (Signed)
Physical Therapy Evaluation Patient Details Name: Casey Collins MRN: [id number] DOB: 01/08/1936 Today's Date: 05/06/2019   History of Present Illness  Pt is an 84 y/o male presenting to the ED secondary to fall at ALF that resulted in L 10th rib fx and R eye hematoma. PMH includes dementia, CVA with L sided weakness, HTN, and DM.   Clinical Impression  Pt admitted secondary to problem above with deficits below. Pt requiring mod A to take steps at EOB. Dementia and LUE weakness at baseline. Pt with multiple falls at ALF and per notes, staff unable to assist pt at necessary level. Feel he will benefit from SNF level therapies at d/c. Will continue to follow acutely to maximize functional mobility independence and safety.     Follow Up Recommendations SNF;Supervision/Assistance - 24 hour    Equipment Recommendations  None recommended by PT    Recommendations for Other Services       Precautions / Restrictions Precautions Precautions: Fall Precaution Comments: Pt with multiple falls at ALF  Restrictions Weight Bearing Restrictions: No      Mobility  Bed Mobility Overal bed mobility: Needs Assistance Bed Mobility: Supine to Sit;Sit to Supine     Supine to sit: Min assist Sit to supine: Min assist   General bed mobility comments: Min A for LE and trunk assist.   Transfers Overall transfer level: Needs assistance Equipment used: 1 person hand held assist Transfers: Sit to/from Stand Sit to Stand: Min assist         General transfer comment: Min A For lift assist and steadying from higher stretcher height.   Ambulation/Gait Ambulation/Gait assistance: Min assist;Mod assist   Assistive device: 1 person hand held assist   Gait velocity: Decreased   General Gait Details: Min to mod A for steadying assist to take steps at EOB. Pt with notable hesitancy to take steps.   Stairs            Wheelchair Mobility    Modified Rankin (Stroke Patients Only)       Balance Overall balance assessment: History of Falls;Needs assistance Sitting-balance support: No upper extremity supported;Feet supported Sitting balance-Leahy Scale: Fair     Standing balance support: Single extremity supported;During functional activity Standing balance-Leahy Scale: Poor Standing balance comment: Reliant on UE and external support                              Pertinent Vitals/Pain Pain Assessment: Faces Faces Pain Scale: Hurts little more Pain Location: L rib Pain Descriptors / Indicators: Grimacing;Guarding Pain Intervention(s): Limited activity within patient's tolerance;Monitored during session;Repositioned    Home Living Family/patient expects to be discharged to:: Skilled nursing facility                 Additional Comments: Pt from ALF, however, per notes, staff are unable to assist pt.     Prior Function Level of Independence: Needs assistance   Gait / Transfers Assistance Needed: Pt reports he was using cane, however, had that taken away and now uses WC.   ADL's / Homemaking Assistance Needed: Facility assists        Hand Dominance        Extremity/Trunk Assessment   Upper Extremity Assessment Upper Extremity Assessment: LUE deficits/detail;Generalized weakness LUE Deficits / Details: L hand weakness at baseline. Pt unable to fully extend fingers as well.     Lower Extremity Assessment Lower Extremity Assessment: Generalized weakness  Cervical / Trunk Assessment Cervical / Trunk Assessment: Kyphotic  Communication   Communication: Expressive difficulties  Cognition Arousal/Alertness: Awake/alert Behavior During Therapy: WFL for tasks assessed/performed Overall Cognitive Status: History of cognitive impairments - at baseline                                 General Comments: Pt with dementia at baseline.       General Comments General comments (skin integrity, edema, etc.): Pt reports no visual  deficits in R eye.     Exercises     Assessment/Plan    PT Assessment Patient needs continued PT services  PT Problem List Decreased strength;Decreased balance;Decreased mobility;Decreased cognition;Decreased knowledge of use of DME;Decreased safety awareness;Decreased knowledge of precautions       PT Treatment Interventions Gait training;DME instruction;Functional mobility training;Therapeutic activities;Therapeutic exercise;Balance training;Patient/family education;Cognitive remediation    PT Goals (Current goals can be found in the Care Plan section)  Acute Rehab PT Goals Patient Stated Goal: "to go home"  PT Goal Formulation: With patient Time For Goal Achievement: 05/20/19 Potential to Achieve Goals: Fair    Frequency Min 2X/week   Barriers to discharge        Co-evaluation               AM-PAC PT "6 Clicks" Mobility  Outcome Measure Help needed turning from your back to your side while in a flat bed without using bedrails?: A Little Help needed moving from lying on your back to sitting on the side of a flat bed without using bedrails?: A Little Help needed moving to and from a bed to a chair (including a wheelchair)?: A Lot Help needed standing up from a chair using your arms (e.g., wheelchair or bedside chair)?: A Little Help needed to walk in hospital room?: A Lot Help needed climbing 3-5 steps with a railing? : A Lot 6 Click Score: 15    End of Session Equipment Utilized During Treatment: Gait belt Activity Tolerance: Patient tolerated treatment well Patient left: in bed;with call bell/phone within reach Nurse Communication: Mobility status PT Visit Diagnosis: Unsteadiness on feet (R26.81);History of falling (Z91.81);Repeated falls (R29.6)    Time: [id number] PT Time Calculation (min) (ACUTE ONLY): 16 min   Charges:   PT Evaluation $PT Eval Moderate Complexity: 1 Mod          Reuel Derby, PT, DPT  Acute Rehabilitation Services  Pager: [phone] Office: [phone]   Rudean Hitt 05/06/2019, 5:29 PM

## 2019-05-06 NOTE — Progress Notes (Addendum)
Supervisor from [employer] [phone]) called to voice concern That Casey Collins was too much to care for at Assisted living with falls and care. Spoke to MD and PT consult ordered. For SNF evaluation Called  Sone in law listed in contacts. Daughter in background. They feel placement is wherever is safe. He was in a rehab facility for a few weeks. Would like to be close to [place] if possible. Will await PT evaluation  Cancelled transport.
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
## 2019-05-06 NOTE — ED Notes (Signed)
[facility] called @ 1300-per Will, RN called by Levada Dy

## 2019-05-06 NOTE — Progress Notes (Signed)
CSW faxed out FL2 and therapy notes to several area SNFs

## 2019-05-06 NOTE — ED Triage Notes (Addendum)
Pt. Presents from [facility] due to unwitnessed fall today. Patient with Hx Dementia and CVA-Left sided weakness. Patient is awake and alert to self, able to recall fall but cannot remember how he fell. He is complaining of pain to right side of face and neck with left rib pain.

## 2019-05-07 ENCOUNTER — Telehealth: Payer: Self-pay

## 2019-05-07 DIAGNOSIS — W19XXXA Unspecified fall, initial encounter: Secondary | ICD-10-CM | POA: Diagnosis not present

## 2019-05-07 DIAGNOSIS — E1122 Type 2 diabetes mellitus with diabetic chronic kidney disease: Secondary | ICD-10-CM | POA: Diagnosis not present

## 2019-05-07 DIAGNOSIS — I130 Hypertensive heart and chronic kidney disease with heart failure and stage 1 through stage 4 chronic kidney disease, or unspecified chronic kidney disease: Secondary | ICD-10-CM | POA: Diagnosis not present

## 2019-05-07 DIAGNOSIS — I5042 Chronic combined systolic (congestive) and diastolic (congestive) heart failure: Secondary | ICD-10-CM | POA: Diagnosis not present

## 2019-05-07 DIAGNOSIS — I10 Essential (primary) hypertension: Secondary | ICD-10-CM | POA: Diagnosis not present

## 2019-05-07 DIAGNOSIS — R2689 Other abnormalities of gait and mobility: Secondary | ICD-10-CM | POA: Diagnosis not present

## 2019-05-07 DIAGNOSIS — S72002A Fracture of unspecified part of neck of left femur, initial encounter for closed fracture: Secondary | ICD-10-CM | POA: Diagnosis not present

## 2019-05-07 DIAGNOSIS — F331 Major depressive disorder, recurrent, moderate: Secondary | ICD-10-CM | POA: Diagnosis not present

## 2019-05-07 DIAGNOSIS — F0391 Unspecified dementia with behavioral disturbance: Secondary | ICD-10-CM | POA: Diagnosis not present

## 2019-05-07 DIAGNOSIS — Z79899 Other long term (current) drug therapy: Secondary | ICD-10-CM | POA: Diagnosis not present

## 2019-05-07 DIAGNOSIS — E1165 Type 2 diabetes mellitus with hyperglycemia: Secondary | ICD-10-CM | POA: Diagnosis not present

## 2019-05-07 DIAGNOSIS — M25552 Pain in left hip: Secondary | ICD-10-CM | POA: Diagnosis not present

## 2019-05-07 DIAGNOSIS — F419 Anxiety disorder, unspecified: Secondary | ICD-10-CM | POA: Diagnosis present

## 2019-05-07 DIAGNOSIS — W1811XA Fall from or off toilet without subsequent striking against object, initial encounter: Secondary | ICD-10-CM | POA: Diagnosis present

## 2019-05-07 DIAGNOSIS — I708 Atherosclerosis of other arteries: Secondary | ICD-10-CM | POA: Diagnosis not present

## 2019-05-07 DIAGNOSIS — R001 Bradycardia, unspecified: Secondary | ICD-10-CM | POA: Diagnosis not present

## 2019-05-07 DIAGNOSIS — R0902 Hypoxemia: Secondary | ICD-10-CM | POA: Diagnosis not present

## 2019-05-07 DIAGNOSIS — D631 Anemia in chronic kidney disease: Secondary | ICD-10-CM | POA: Diagnosis not present

## 2019-05-07 DIAGNOSIS — I499 Cardiac arrhythmia, unspecified: Secondary | ICD-10-CM | POA: Diagnosis not present

## 2019-05-07 DIAGNOSIS — G4701 Insomnia due to medical condition: Secondary | ICD-10-CM | POA: Diagnosis not present

## 2019-05-07 DIAGNOSIS — F329 Major depressive disorder, single episode, unspecified: Secondary | ICD-10-CM | POA: Diagnosis present

## 2019-05-07 DIAGNOSIS — F064 Anxiety disorder due to known physiological condition: Secondary | ICD-10-CM | POA: Diagnosis not present

## 2019-05-07 DIAGNOSIS — E785 Hyperlipidemia, unspecified: Secondary | ICD-10-CM | POA: Diagnosis not present

## 2019-05-07 DIAGNOSIS — Z20822 Contact with and (suspected) exposure to covid-19: Secondary | ICD-10-CM | POA: Diagnosis not present

## 2019-05-07 DIAGNOSIS — N183 Chronic kidney disease, stage 3 unspecified: Secondary | ICD-10-CM | POA: Diagnosis not present

## 2019-05-07 DIAGNOSIS — S0083XA Contusion of other part of head, initial encounter: Secondary | ICD-10-CM | POA: Diagnosis not present

## 2019-05-07 DIAGNOSIS — R4182 Altered mental status, unspecified: Secondary | ICD-10-CM | POA: Diagnosis not present

## 2019-05-07 DIAGNOSIS — F0151 Vascular dementia with behavioral disturbance: Secondary | ICD-10-CM | POA: Diagnosis not present

## 2019-05-07 DIAGNOSIS — M5136 Other intervertebral disc degeneration, lumbar region: Secondary | ICD-10-CM | POA: Diagnosis not present

## 2019-05-07 DIAGNOSIS — Y92121 Bathroom in nursing home as the place of occurrence of the external cause: Secondary | ICD-10-CM | POA: Diagnosis not present

## 2019-05-07 DIAGNOSIS — E1140 Type 2 diabetes mellitus with diabetic neuropathy, unspecified: Secondary | ICD-10-CM | POA: Diagnosis not present

## 2019-05-07 DIAGNOSIS — Z139 Encounter for screening, unspecified: Secondary | ICD-10-CM | POA: Diagnosis not present

## 2019-05-07 DIAGNOSIS — Y92009 Unspecified place in unspecified non-institutional (private) residence as the place of occurrence of the external cause: Secondary | ICD-10-CM | POA: Diagnosis not present

## 2019-05-07 DIAGNOSIS — N179 Acute kidney failure, unspecified: Secondary | ICD-10-CM | POA: Diagnosis not present

## 2019-05-07 DIAGNOSIS — F0150 Vascular dementia without behavioral disturbance: Secondary | ICD-10-CM | POA: Diagnosis present

## 2019-05-07 DIAGNOSIS — I517 Cardiomegaly: Secondary | ICD-10-CM | POA: Diagnosis not present

## 2019-05-07 DIAGNOSIS — I129 Hypertensive chronic kidney disease with stage 1 through stage 4 chronic kidney disease, or unspecified chronic kidney disease: Secondary | ICD-10-CM | POA: Diagnosis not present

## 2019-05-07 DIAGNOSIS — M6281 Muscle weakness (generalized): Secondary | ICD-10-CM | POA: Diagnosis not present

## 2019-05-07 DIAGNOSIS — R296 Repeated falls: Secondary | ICD-10-CM | POA: Diagnosis not present

## 2019-05-07 DIAGNOSIS — I69320 Aphasia following cerebral infarction: Secondary | ICD-10-CM | POA: Diagnosis not present

## 2019-05-07 DIAGNOSIS — D649 Anemia, unspecified: Secondary | ICD-10-CM | POA: Diagnosis not present

## 2019-05-07 DIAGNOSIS — Z6826 Body mass index (BMI) 26.0-26.9, adult: Secondary | ICD-10-CM | POA: Diagnosis not present

## 2019-05-07 DIAGNOSIS — I69392 Facial weakness following cerebral infarction: Secondary | ICD-10-CM | POA: Diagnosis not present

## 2019-05-07 DIAGNOSIS — D62 Acute posthemorrhagic anemia: Secondary | ICD-10-CM | POA: Diagnosis not present

## 2019-05-07 DIAGNOSIS — Z7401 Bed confinement status: Secondary | ICD-10-CM | POA: Diagnosis not present

## 2019-05-07 DIAGNOSIS — N1832 Chronic kidney disease, stage 3b: Secondary | ICD-10-CM | POA: Diagnosis not present

## 2019-05-07 DIAGNOSIS — Z7982 Long term (current) use of aspirin: Secondary | ICD-10-CM | POA: Diagnosis not present

## 2019-05-07 DIAGNOSIS — I69354 Hemiplegia and hemiparesis following cerebral infarction affecting left non-dominant side: Secondary | ICD-10-CM | POA: Diagnosis not present

## 2019-05-07 DIAGNOSIS — S2232XD Fracture of one rib, left side, subsequent encounter for fracture with routine healing: Secondary | ICD-10-CM | POA: Diagnosis not present

## 2019-05-07 DIAGNOSIS — S2232XA Fracture of one rib, left side, initial encounter for closed fracture: Secondary | ICD-10-CM | POA: Diagnosis not present

## 2019-05-07 DIAGNOSIS — W19XXXD Unspecified fall, subsequent encounter: Secondary | ICD-10-CM | POA: Diagnosis not present

## 2019-05-07 DIAGNOSIS — I69391 Dysphagia following cerebral infarction: Secondary | ICD-10-CM | POA: Diagnosis not present

## 2019-05-07 DIAGNOSIS — Z8616 Personal history of COVID-19: Secondary | ICD-10-CM | POA: Diagnosis not present

## 2019-05-07 DIAGNOSIS — F0390 Unspecified dementia without behavioral disturbance: Secondary | ICD-10-CM | POA: Diagnosis not present

## 2019-05-07 DIAGNOSIS — F332 Major depressive disorder, recurrent severe without psychotic features: Secondary | ICD-10-CM | POA: Diagnosis not present

## 2019-05-07 DIAGNOSIS — Z9049 Acquired absence of other specified parts of digestive tract: Secondary | ICD-10-CM | POA: Diagnosis not present

## 2019-05-07 DIAGNOSIS — Z7902 Long term (current) use of antithrombotics/antiplatelets: Secondary | ICD-10-CM | POA: Diagnosis not present

## 2019-05-07 DIAGNOSIS — F339 Major depressive disorder, recurrent, unspecified: Secondary | ICD-10-CM | POA: Diagnosis not present

## 2019-05-07 DIAGNOSIS — W010XXA Fall on same level from slipping, tripping and stumbling without subsequent striking against object, initial encounter: Secondary | ICD-10-CM | POA: Diagnosis not present

## 2019-05-07 DIAGNOSIS — S72012A Unspecified intracapsular fracture of left femur, initial encounter for closed fracture: Secondary | ICD-10-CM | POA: Diagnosis not present

## 2019-05-07 DIAGNOSIS — N1831 Chronic kidney disease, stage 3a: Secondary | ICD-10-CM | POA: Diagnosis not present

## 2019-05-07 DIAGNOSIS — M2550 Pain in unspecified joint: Secondary | ICD-10-CM | POA: Diagnosis not present

## 2019-05-07 DIAGNOSIS — I69328 Other speech and language deficits following cerebral infarction: Secondary | ICD-10-CM | POA: Diagnosis not present

## 2019-05-07 DIAGNOSIS — E119 Type 2 diabetes mellitus without complications: Secondary | ICD-10-CM | POA: Diagnosis not present

## 2019-05-07 DIAGNOSIS — I6930 Unspecified sequelae of cerebral infarction: Secondary | ICD-10-CM | POA: Diagnosis not present

## 2019-05-07 DIAGNOSIS — Z8673 Personal history of transient ischemic attack (TIA), and cerebral infarction without residual deficits: Secondary | ICD-10-CM | POA: Diagnosis not present

## 2019-05-07 DIAGNOSIS — R52 Pain, unspecified: Secondary | ICD-10-CM | POA: Diagnosis not present

## 2019-05-07 DIAGNOSIS — Z794 Long term (current) use of insulin: Secondary | ICD-10-CM | POA: Diagnosis not present

## 2019-05-07 DIAGNOSIS — S0993XD Unspecified injury of face, subsequent encounter: Secondary | ICD-10-CM | POA: Diagnosis not present

## 2019-05-07 DIAGNOSIS — N400 Enlarged prostate without lower urinary tract symptoms: Secondary | ICD-10-CM | POA: Diagnosis present

## 2019-05-07 LAB — SARS CORONAVIRUS 2 (TAT 6-24 HRS): SARS Coronavirus 2: NEGATIVE

## 2019-05-07 LAB — CBG MONITORING, ED
Glucose-Capillary: 106 mg/dL — ABNORMAL HIGH (ref 70–99)
Glucose-Capillary: 196 mg/dL — ABNORMAL HIGH (ref 70–99)

## 2019-05-07 NOTE — ED Notes (Signed)
Up to BR for personal cares, changed into clean clothes and hygiene completed. Requires 2 person (A) to stand.

## 2019-05-07 NOTE — ED Notes (Signed)
SNF

## 2019-05-07 NOTE — TOC Progression Note (Addendum)
Transition of Care [facility]) - Progression Note    Patient Details  Name: Bowden Zills MRN: [id number] Date of Birth: 12-02-1935  Transition of Care [facility]) CM/SW [place], RN Phone Number: 05/07/2019, 9:02 AM  Clinical Narrative:    Awaiting on acceptance from SNF patient in hall bed., currently sleeping. COVID test done, negative.       Barriers to Discharge: Other (comment)([facility] called to state they do not feel they can take care of patient we are now evaling for SNF)  Expected Discharge Plan and Services    to SNF via Ambulance transport                                             Social Determinants of Health (SDOH) Interventions    Readmission Risk Interventions No flowsheet data found.

## 2019-05-07 NOTE — Progress Notes (Signed)
Patient will go to room 118p at [facility] Patient will be transported via [place]. The number to call for report is [phone].   Please fax AVS to [phone].  Madilyn Fireman, MSW, LCSW-A Transitions of Care  Clinical Social Worker  [facility]  Medical ICU [phone]

## 2019-05-07 NOTE — ED Notes (Signed)
Breakfast Ordered 

## 2019-05-07 NOTE — Telephone Encounter (Signed)
Called son in law Gay Filler to let him know that transport had been called for Mr Gallman to be sent to [facility].

## 2019-05-07 NOTE — ED Notes (Signed)
Called [facility] for transport.  

## 2019-05-07 NOTE — ED Notes (Signed)
Spoke to Pt who was lightly sleeping. Pt stated that he was was doing ok. Blankets were given for pillow.
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
## 2019-05-07 NOTE — Progress Notes (Signed)
Son here Has chosen [facility]. Social work notified. Will call [facility] for a time of discharge.

## 2019-05-07 NOTE — Progress Notes (Signed)
Patient information faxed to [facility]. RN calling report. Transport here to pick up patient.

## 2019-05-10 DIAGNOSIS — M6281 Muscle weakness (generalized): Secondary | ICD-10-CM | POA: Diagnosis not present

## 2019-05-10 DIAGNOSIS — N1831 Chronic kidney disease, stage 3a: Secondary | ICD-10-CM | POA: Diagnosis not present

## 2019-05-10 DIAGNOSIS — F331 Major depressive disorder, recurrent, moderate: Secondary | ICD-10-CM | POA: Diagnosis not present

## 2019-05-10 DIAGNOSIS — S2232XD Fracture of one rib, left side, subsequent encounter for fracture with routine healing: Secondary | ICD-10-CM | POA: Diagnosis not present

## 2019-05-10 DIAGNOSIS — F0390 Unspecified dementia without behavioral disturbance: Secondary | ICD-10-CM | POA: Diagnosis not present

## 2019-05-10 DIAGNOSIS — W19XXXD Unspecified fall, subsequent encounter: Secondary | ICD-10-CM | POA: Diagnosis not present

## 2019-05-13 ENCOUNTER — Other Ambulatory Visit: Payer: Self-pay | Admitting: *Deleted

## 2019-05-13 DIAGNOSIS — S0993XD Unspecified injury of face, subsequent encounter: Secondary | ICD-10-CM | POA: Diagnosis not present

## 2019-05-13 DIAGNOSIS — F0390 Unspecified dementia without behavioral disturbance: Secondary | ICD-10-CM | POA: Diagnosis not present

## 2019-05-13 DIAGNOSIS — E785 Hyperlipidemia, unspecified: Secondary | ICD-10-CM | POA: Diagnosis not present

## 2019-05-13 DIAGNOSIS — I10 Essential (primary) hypertension: Secondary | ICD-10-CM | POA: Diagnosis not present

## 2019-05-13 NOTE — Patient Outreach (Signed)
Member screened for potential [facility] Care Management needs as a benefit of [place] [employer].  Mr. Coffer is receiving skilled therapy at [facility].   He is previously from ALF. Will continue to follow for transition plans while member resides in [facility].   Marthenia Rolling, MSN-Ed, RN,BSN [place] Acute Care Coordinator [phone] [facility]) [phone]  (Toll free office)

## 2019-05-20 DIAGNOSIS — M5136 Other intervertebral disc degeneration, lumbar region: Secondary | ICD-10-CM | POA: Diagnosis not present

## 2019-05-27 DIAGNOSIS — D649 Anemia, unspecified: Secondary | ICD-10-CM | POA: Diagnosis not present

## 2019-05-27 DIAGNOSIS — M6281 Muscle weakness (generalized): Secondary | ICD-10-CM | POA: Diagnosis not present

## 2019-05-27 DIAGNOSIS — E1165 Type 2 diabetes mellitus with hyperglycemia: Secondary | ICD-10-CM | POA: Diagnosis not present

## 2019-05-27 DIAGNOSIS — N1831 Chronic kidney disease, stage 3a: Secondary | ICD-10-CM | POA: Diagnosis not present

## 2019-05-31 NOTE — Telephone Encounter (Signed)
See Note

## 2019-06-03 DIAGNOSIS — E1165 Type 2 diabetes mellitus with hyperglycemia: Secondary | ICD-10-CM | POA: Diagnosis not present

## 2019-06-03 DIAGNOSIS — F331 Major depressive disorder, recurrent, moderate: Secondary | ICD-10-CM | POA: Diagnosis not present

## 2019-06-03 DIAGNOSIS — M6281 Muscle weakness (generalized): Secondary | ICD-10-CM | POA: Diagnosis not present

## 2019-06-03 DIAGNOSIS — F0390 Unspecified dementia without behavioral disturbance: Secondary | ICD-10-CM | POA: Diagnosis not present

## 2019-06-10 ENCOUNTER — Other Ambulatory Visit: Payer: Self-pay | Admitting: *Deleted

## 2019-06-10 NOTE — Patient Outreach (Signed)
[facility] Post- Acute Care Coordinator follow up  Verified member transitioned to long term care at [facility].   No identifiable [facility] needs at this time.   Marthenia Rolling, MSN-Ed, RN,BSN [place] Acute Care Coordinator [phone] [facility]) [phone]  (Toll free office)

## 2019-08-06 DIAGNOSIS — E785 Hyperlipidemia, unspecified: Secondary | ICD-10-CM | POA: Diagnosis not present

## 2019-08-06 DIAGNOSIS — I6930 Unspecified sequelae of cerebral infarction: Secondary | ICD-10-CM | POA: Diagnosis not present

## 2019-08-06 DIAGNOSIS — N1832 Chronic kidney disease, stage 3b: Secondary | ICD-10-CM | POA: Diagnosis not present

## 2019-08-06 DIAGNOSIS — R296 Repeated falls: Secondary | ICD-10-CM | POA: Diagnosis not present

## 2019-08-06 DIAGNOSIS — F0391 Unspecified dementia with behavioral disturbance: Secondary | ICD-10-CM | POA: Diagnosis not present

## 2019-08-06 DIAGNOSIS — M6281 Muscle weakness (generalized): Secondary | ICD-10-CM | POA: Diagnosis not present

## 2019-08-06 DIAGNOSIS — I69354 Hemiplegia and hemiparesis following cerebral infarction affecting left non-dominant side: Secondary | ICD-10-CM | POA: Diagnosis not present

## 2019-08-06 DIAGNOSIS — Z6826 Body mass index (BMI) 26.0-26.9, adult: Secondary | ICD-10-CM | POA: Diagnosis not present

## 2019-08-06 DIAGNOSIS — I10 Essential (primary) hypertension: Secondary | ICD-10-CM | POA: Diagnosis not present

## 2019-08-06 DIAGNOSIS — E1140 Type 2 diabetes mellitus with diabetic neuropathy, unspecified: Secondary | ICD-10-CM | POA: Diagnosis not present

## 2019-08-06 DIAGNOSIS — F332 Major depressive disorder, recurrent severe without psychotic features: Secondary | ICD-10-CM | POA: Diagnosis not present

## 2019-08-06 DIAGNOSIS — I69391 Dysphagia following cerebral infarction: Secondary | ICD-10-CM | POA: Diagnosis not present

## 2019-08-20 DIAGNOSIS — F064 Anxiety disorder due to known physiological condition: Secondary | ICD-10-CM | POA: Diagnosis not present

## 2019-08-20 DIAGNOSIS — F331 Major depressive disorder, recurrent, moderate: Secondary | ICD-10-CM | POA: Diagnosis not present

## 2019-08-20 DIAGNOSIS — F0390 Unspecified dementia without behavioral disturbance: Secondary | ICD-10-CM | POA: Diagnosis not present

## 2019-08-20 DIAGNOSIS — G4701 Insomnia due to medical condition: Secondary | ICD-10-CM | POA: Diagnosis not present

## 2019-08-25 DIAGNOSIS — E1165 Type 2 diabetes mellitus with hyperglycemia: Secondary | ICD-10-CM | POA: Diagnosis not present

## 2019-08-25 DIAGNOSIS — I69354 Hemiplegia and hemiparesis following cerebral infarction affecting left non-dominant side: Secondary | ICD-10-CM | POA: Diagnosis not present

## 2019-08-25 DIAGNOSIS — N1831 Chronic kidney disease, stage 3a: Secondary | ICD-10-CM | POA: Diagnosis not present

## 2019-08-25 DIAGNOSIS — I69320 Aphasia following cerebral infarction: Secondary | ICD-10-CM | POA: Diagnosis not present

## 2019-09-06 ENCOUNTER — Inpatient Hospital Stay (HOSPITAL_COMMUNITY)
Admission: EM | Admit: 2019-09-06 | Discharge: 2019-09-10 | DRG: 522 | Disposition: A | Discharge status: Skilled Nursing Facility | Payer: Medicare Other | Source: Skilled Nursing Facility | Attending: Internal Medicine | Admitting: Internal Medicine

## 2019-09-06 ENCOUNTER — Other Ambulatory Visit: Payer: Self-pay

## 2019-09-06 ENCOUNTER — Emergency Department (HOSPITAL_COMMUNITY): Payer: Medicare Other

## 2019-09-06 DIAGNOSIS — I69354 Hemiplegia and hemiparesis following cerebral infarction affecting left non-dominant side: Secondary | ICD-10-CM

## 2019-09-06 DIAGNOSIS — R001 Bradycardia, unspecified: Secondary | ICD-10-CM | POA: Diagnosis not present

## 2019-09-06 DIAGNOSIS — I708 Atherosclerosis of other arteries: Secondary | ICD-10-CM | POA: Diagnosis not present

## 2019-09-06 DIAGNOSIS — Z8673 Personal history of transient ischemic attack (TIA), and cerebral infarction without residual deficits: Secondary | ICD-10-CM

## 2019-09-06 DIAGNOSIS — Z9049 Acquired absence of other specified parts of digestive tract: Secondary | ICD-10-CM | POA: Diagnosis not present

## 2019-09-06 DIAGNOSIS — N400 Enlarged prostate without lower urinary tract symptoms: Secondary | ICD-10-CM | POA: Diagnosis present

## 2019-09-06 DIAGNOSIS — W1811XA Fall from or off toilet without subsequent striking against object, initial encounter: Secondary | ICD-10-CM | POA: Diagnosis present

## 2019-09-06 DIAGNOSIS — Z7982 Long term (current) use of aspirin: Secondary | ICD-10-CM | POA: Diagnosis not present

## 2019-09-06 DIAGNOSIS — I130 Hypertensive heart and chronic kidney disease with heart failure and stage 1 through stage 4 chronic kidney disease, or unspecified chronic kidney disease: Secondary | ICD-10-CM | POA: Diagnosis present

## 2019-09-06 DIAGNOSIS — F32A Depression, unspecified: Secondary | ICD-10-CM | POA: Diagnosis present

## 2019-09-06 DIAGNOSIS — I517 Cardiomegaly: Secondary | ICD-10-CM | POA: Diagnosis not present

## 2019-09-06 DIAGNOSIS — N183 Chronic kidney disease, stage 3 unspecified: Secondary | ICD-10-CM | POA: Diagnosis not present

## 2019-09-06 DIAGNOSIS — S72009A Fracture of unspecified part of neck of unspecified femur, initial encounter for closed fracture: Secondary | ICD-10-CM | POA: Diagnosis present

## 2019-09-06 DIAGNOSIS — N189 Chronic kidney disease, unspecified: Secondary | ICD-10-CM | POA: Diagnosis present

## 2019-09-06 DIAGNOSIS — F0150 Vascular dementia without behavioral disturbance: Secondary | ICD-10-CM | POA: Diagnosis present

## 2019-09-06 DIAGNOSIS — R269 Unspecified abnormalities of gait and mobility: Secondary | ICD-10-CM | POA: Diagnosis not present

## 2019-09-06 DIAGNOSIS — I69392 Facial weakness following cerebral infarction: Secondary | ICD-10-CM | POA: Diagnosis not present

## 2019-09-06 DIAGNOSIS — F0151 Vascular dementia with behavioral disturbance: Secondary | ICD-10-CM | POA: Diagnosis not present

## 2019-09-06 DIAGNOSIS — I69320 Aphasia following cerebral infarction: Secondary | ICD-10-CM | POA: Diagnosis not present

## 2019-09-06 DIAGNOSIS — Z419 Encounter for procedure for purposes other than remedying health state, unspecified: Secondary | ICD-10-CM

## 2019-09-06 DIAGNOSIS — Z96642 Presence of left artificial hip joint: Secondary | ICD-10-CM | POA: Diagnosis not present

## 2019-09-06 DIAGNOSIS — Z794 Long term (current) use of insulin: Secondary | ICD-10-CM | POA: Diagnosis not present

## 2019-09-06 DIAGNOSIS — E119 Type 2 diabetes mellitus without complications: Secondary | ICD-10-CM

## 2019-09-06 DIAGNOSIS — N1832 Chronic kidney disease, stage 3b: Secondary | ICD-10-CM | POA: Diagnosis present

## 2019-09-06 DIAGNOSIS — D631 Anemia in chronic kidney disease: Secondary | ICD-10-CM | POA: Diagnosis present

## 2019-09-06 DIAGNOSIS — M25562 Pain in left knee: Secondary | ICD-10-CM | POA: Diagnosis not present

## 2019-09-06 DIAGNOSIS — Z79899 Other long term (current) drug therapy: Secondary | ICD-10-CM

## 2019-09-06 DIAGNOSIS — I69391 Dysphagia following cerebral infarction: Secondary | ICD-10-CM | POA: Diagnosis not present

## 2019-09-06 DIAGNOSIS — R4182 Altered mental status, unspecified: Secondary | ICD-10-CM | POA: Diagnosis not present

## 2019-09-06 DIAGNOSIS — E1165 Type 2 diabetes mellitus with hyperglycemia: Secondary | ICD-10-CM | POA: Diagnosis not present

## 2019-09-06 DIAGNOSIS — F0391 Unspecified dementia with behavioral disturbance: Secondary | ICD-10-CM | POA: Diagnosis not present

## 2019-09-06 DIAGNOSIS — S72002A Fracture of unspecified part of neck of left femur, initial encounter for closed fracture: Secondary | ICD-10-CM

## 2019-09-06 DIAGNOSIS — I5042 Chronic combined systolic (congestive) and diastolic (congestive) heart failure: Secondary | ICD-10-CM | POA: Diagnosis not present

## 2019-09-06 DIAGNOSIS — W19XXXA Unspecified fall, initial encounter: Secondary | ICD-10-CM

## 2019-09-06 DIAGNOSIS — Z7902 Long term (current) use of antithrombotics/antiplatelets: Secondary | ICD-10-CM | POA: Diagnosis not present

## 2019-09-06 DIAGNOSIS — F332 Major depressive disorder, recurrent severe without psychotic features: Secondary | ICD-10-CM | POA: Diagnosis not present

## 2019-09-06 DIAGNOSIS — I69328 Other speech and language deficits following cerebral infarction: Secondary | ICD-10-CM | POA: Diagnosis not present

## 2019-09-06 DIAGNOSIS — I129 Hypertensive chronic kidney disease with stage 1 through stage 4 chronic kidney disease, or unspecified chronic kidney disease: Secondary | ICD-10-CM | POA: Diagnosis not present

## 2019-09-06 DIAGNOSIS — E785 Hyperlipidemia, unspecified: Secondary | ICD-10-CM | POA: Diagnosis present

## 2019-09-06 DIAGNOSIS — F419 Anxiety disorder, unspecified: Secondary | ICD-10-CM | POA: Diagnosis present

## 2019-09-06 DIAGNOSIS — S72002D Fracture of unspecified part of neck of left femur, subsequent encounter for closed fracture with routine healing: Secondary | ICD-10-CM | POA: Diagnosis not present

## 2019-09-06 DIAGNOSIS — Z8616 Personal history of COVID-19: Secondary | ICD-10-CM

## 2019-09-06 DIAGNOSIS — S2232XD Fracture of one rib, left side, subsequent encounter for fracture with routine healing: Secondary | ICD-10-CM | POA: Diagnosis not present

## 2019-09-06 DIAGNOSIS — S72012A Unspecified intracapsular fracture of left femur, initial encounter for closed fracture: Principal | ICD-10-CM | POA: Diagnosis present

## 2019-09-06 DIAGNOSIS — M25552 Pain in left hip: Secondary | ICD-10-CM | POA: Diagnosis not present

## 2019-09-06 DIAGNOSIS — Y92009 Unspecified place in unspecified non-institutional (private) residence as the place of occurrence of the external cause: Secondary | ICD-10-CM

## 2019-09-06 DIAGNOSIS — I6930 Unspecified sequelae of cerebral infarction: Secondary | ICD-10-CM | POA: Diagnosis not present

## 2019-09-06 DIAGNOSIS — E1140 Type 2 diabetes mellitus with diabetic neuropathy, unspecified: Secondary | ICD-10-CM | POA: Diagnosis present

## 2019-09-06 DIAGNOSIS — N179 Acute kidney failure, unspecified: Secondary | ICD-10-CM | POA: Diagnosis present

## 2019-09-06 DIAGNOSIS — R0902 Hypoxemia: Secondary | ICD-10-CM | POA: Diagnosis not present

## 2019-09-06 DIAGNOSIS — R279 Unspecified lack of coordination: Secondary | ICD-10-CM | POA: Diagnosis not present

## 2019-09-06 DIAGNOSIS — F0390 Unspecified dementia without behavioral disturbance: Secondary | ICD-10-CM | POA: Diagnosis present

## 2019-09-06 DIAGNOSIS — M2550 Pain in unspecified joint: Secondary | ICD-10-CM | POA: Diagnosis not present

## 2019-09-06 DIAGNOSIS — Z7401 Bed confinement status: Secondary | ICD-10-CM | POA: Diagnosis not present

## 2019-09-06 DIAGNOSIS — F329 Major depressive disorder, single episode, unspecified: Secondary | ICD-10-CM | POA: Diagnosis present

## 2019-09-06 DIAGNOSIS — M6281 Muscle weakness (generalized): Secondary | ICD-10-CM | POA: Diagnosis not present

## 2019-09-06 DIAGNOSIS — I469 Cardiac arrest, cause unspecified: Secondary | ICD-10-CM | POA: Diagnosis not present

## 2019-09-06 DIAGNOSIS — M25452 Effusion, left hip: Secondary | ICD-10-CM | POA: Diagnosis not present

## 2019-09-06 DIAGNOSIS — D62 Acute posthemorrhagic anemia: Secondary | ICD-10-CM | POA: Diagnosis not present

## 2019-09-06 DIAGNOSIS — I10 Essential (primary) hypertension: Secondary | ICD-10-CM | POA: Diagnosis not present

## 2019-09-06 DIAGNOSIS — R52 Pain, unspecified: Secondary | ICD-10-CM | POA: Diagnosis not present

## 2019-09-06 DIAGNOSIS — Y92121 Bathroom in nursing home as the place of occurrence of the external cause: Secondary | ICD-10-CM

## 2019-09-06 DIAGNOSIS — S7292XA Unspecified fracture of left femur, initial encounter for closed fracture: Secondary | ICD-10-CM | POA: Diagnosis present

## 2019-09-06 DIAGNOSIS — E1122 Type 2 diabetes mellitus with diabetic chronic kidney disease: Secondary | ICD-10-CM | POA: Diagnosis present

## 2019-09-06 DIAGNOSIS — G309 Alzheimer's disease, unspecified: Secondary | ICD-10-CM | POA: Diagnosis present

## 2019-09-06 DIAGNOSIS — F0280 Dementia in other diseases classified elsewhere without behavioral disturbance: Secondary | ICD-10-CM | POA: Diagnosis present

## 2019-09-06 DIAGNOSIS — R2689 Other abnormalities of gait and mobility: Secondary | ICD-10-CM | POA: Diagnosis not present

## 2019-09-06 DIAGNOSIS — Z471 Aftercare following joint replacement surgery: Secondary | ICD-10-CM | POA: Diagnosis not present

## 2019-09-06 DIAGNOSIS — W010XXA Fall on same level from slipping, tripping and stumbling without subsequent striking against object, initial encounter: Secondary | ICD-10-CM | POA: Diagnosis not present

## 2019-09-06 DIAGNOSIS — R296 Repeated falls: Secondary | ICD-10-CM | POA: Diagnosis not present

## 2019-09-06 LAB — CBC
HCT: 37.2 % — ABNORMAL LOW (ref 39.0–52.0)
Hemoglobin: 11.5 g/dL — ABNORMAL LOW (ref 13.0–17.0)
MCH: 29.2 pg (ref 26.0–34.0)
MCHC: 30.9 g/dL (ref 30.0–36.0)
MCV: 94.4 fL (ref 80.0–100.0)
Platelets: 210 10*3/uL (ref 150–400)
RBC: 3.94 MIL/uL — ABNORMAL LOW (ref 4.22–5.81)
RDW: 14.5 % (ref 11.5–15.5)
WBC: 11.2 10*3/uL — ABNORMAL HIGH (ref 4.0–10.5)
nRBC: 0 % (ref 0.0–0.2)

## 2019-09-06 LAB — BASIC METABOLIC PANEL
Anion gap: 8 (ref 5–15)
BUN: 38 mg/dL — ABNORMAL HIGH (ref 8–23)
CO2: 28 mmol/L (ref 22–32)
Calcium: 9 mg/dL (ref 8.9–10.3)
Chloride: 103 mmol/L (ref 98–111)
Creatinine, Ser: 1.99 mg/dL — ABNORMAL HIGH (ref 0.61–1.24)
GFR calc Af Amer: 35 mL/min — ABNORMAL LOW (ref >60)
GFR calc non Af Amer: 30 mL/min — ABNORMAL LOW (ref >60)
Glucose, Bld: 87 mg/dL (ref 70–99)
Potassium: 4.3 mmol/L (ref 3.5–5.1)
Sodium: 139 mmol/L (ref 135–145)

## 2019-09-06 LAB — CBC WITH DIFFERENTIAL/PLATELET
Abs Immature Granulocytes: 0.03 10*3/uL (ref 0.00–0.07)
Basophils Absolute: 0 10*3/uL (ref 0.0–0.1)
Basophils Relative: 0 %
Eosinophils Absolute: 0.3 10*3/uL (ref 0.0–0.5)
Eosinophils Relative: 3 %
HCT: 36.6 % — ABNORMAL LOW (ref 39.0–52.0)
Hemoglobin: 11.2 g/dL — ABNORMAL LOW (ref 13.0–17.0)
Immature Granulocytes: 0 %
Lymphocytes Relative: 10 %
Lymphs Abs: 1.1 10*3/uL (ref 0.7–4.0)
MCH: 29.1 pg (ref 26.0–34.0)
MCHC: 30.6 g/dL (ref 30.0–36.0)
MCV: 95.1 fL (ref 80.0–100.0)
Monocytes Absolute: 1 10*3/uL (ref 0.1–1.0)
Monocytes Relative: 10 %
Neutro Abs: 7.8 10*3/uL — ABNORMAL HIGH (ref 1.7–7.7)
Neutrophils Relative %: 77 %
Platelets: 194 10*3/uL (ref 150–400)
RBC: 3.85 MIL/uL — ABNORMAL LOW (ref 4.22–5.81)
RDW: 14.4 % (ref 11.5–15.5)
WBC: 10.3 10*3/uL (ref 4.0–10.5)
nRBC: 0 % (ref 0.0–0.2)

## 2019-09-06 LAB — TYPE AND SCREEN
ABO/RH(D): A POS
Antibody Screen: NEGATIVE

## 2019-09-06 LAB — PHOSPHORUS: Phosphorus: 3.8 mg/dL (ref 2.5–4.6)

## 2019-09-06 LAB — CREATININE, SERUM
Creatinine, Ser: 1.98 mg/dL — ABNORMAL HIGH (ref 0.61–1.24)
GFR calc Af Amer: 35 mL/min — ABNORMAL LOW (ref >60)
GFR calc non Af Amer: 31 mL/min — ABNORMAL LOW (ref >60)

## 2019-09-06 LAB — SARS CORONAVIRUS 2 BY RT PCR (HOSPITAL ORDER, PERFORMED IN ~~LOC~~ HOSPITAL LAB): SARS Coronavirus 2: NEGATIVE

## 2019-09-06 LAB — PROTIME-INR
INR: 1.2 (ref 0.8–1.2)
Prothrombin Time: 14.5 seconds (ref 11.4–15.2)

## 2019-09-06 LAB — TSH: TSH: 4.099 u[IU]/mL (ref 0.350–4.500)

## 2019-09-06 LAB — MAGNESIUM: Magnesium: 1.9 mg/dL (ref 1.7–2.4)

## 2019-09-06 MED ORDER — SODIUM CHLORIDE 0.9 % IV SOLN: INTRAVENOUS | Status: DC

## 2019-09-06 MED ORDER — FENTANYL CITRATE (PF) 100 MCG/2ML IJ SOLN
INTRAMUSCULAR | Status: AC
  Administered 2019-09-06: 25 ug via INTRAVENOUS
  Filled 2019-09-06: qty 2

## 2019-09-06 MED ORDER — ACETAMINOPHEN 325 MG PO TABS
650.0000 mg | ORAL_TABLET | Freq: Four times a day (QID) | ORAL | Status: DC | PRN
  Administered 2019-09-06: 650 mg via ORAL
  Filled 2019-09-06: qty 2

## 2019-09-06 MED ORDER — ONDANSETRON HCL 4 MG/2ML IJ SOLN: 4.0000 mg | Freq: Four times a day (QID) | INTRAMUSCULAR | Status: DC | PRN

## 2019-09-06 MED ORDER — HYDROMORPHONE HCL 1 MG/ML IJ SOLN
0.5000 mg | INTRAMUSCULAR | Status: DC | PRN
  Administered 2019-09-07: 1 mg via INTRAVENOUS
  Filled 2019-09-06: qty 1

## 2019-09-06 MED ORDER — SODIUM CHLORIDE 0.9 % IV BOLUS
500.0000 mL | Freq: Once | INTRAVENOUS | Status: AC
  Administered 2019-09-06: 500 mL via INTRAVENOUS

## 2019-09-06 MED ORDER — OXYCODONE HCL 5 MG PO TABS
5.0000 mg | ORAL_TABLET | ORAL | Status: DC | PRN
  Administered 2019-09-07 – 2019-09-10 (×5): 5 mg via ORAL
  Filled 2019-09-06 (×5): qty 1

## 2019-09-06 MED ORDER — ONDANSETRON HCL 4 MG PO TABS: 4.0000 mg | ORAL_TABLET | Freq: Four times a day (QID) | ORAL | Status: DC | PRN

## 2019-09-06 MED ORDER — ENOXAPARIN SODIUM 40 MG/0.4ML ~~LOC~~ SOLN
40.0000 mg | SUBCUTANEOUS | Status: DC
  Administered 2019-09-06 – 2019-09-07 (×2): 40 mg via SUBCUTANEOUS
  Filled 2019-09-06 (×2): qty 0.4

## 2019-09-06 MED ORDER — ACETAMINOPHEN 650 MG RE SUPP: 650.0000 mg | Freq: Four times a day (QID) | RECTAL | Status: DC | PRN

## 2019-09-06 MED ORDER — FENTANYL CITRATE (PF) 100 MCG/2ML IJ SOLN: 25.0000 ug | Freq: Once | INTRAMUSCULAR | Status: AC

## 2019-09-06 NOTE — Consult Note (Signed)
Reason for Consult:Left hip fx Referring Physician: R Jaheim Canino is an 84 y.o. male.  HPI: Casey Collins fell last night while going to the bathroom. He slipped on some wet floor. He had immediate pain and could not get up. An x-ray at the SNF where he resides showed a femoral neck fx and he was sent to [facility] for definitive treatment. He c/o pain in the hip and groin. He normally ambulates with a RW but doesn't walk very well 2/2 CVA.  Past Medical History:  Diagnosis Date   Arthritis    Chronic right hip pain    Colon polyp    Dementia ([place])    Depression    Diverticulosis    Hyperlipemia    Hypertension    Pneumonia X 2   Stroke ([place]) 2010-2016    X 4,  "right side weak since; difficulty walking, speaking, read, eat" (06/06/2015)   Type II diabetes mellitus ([place])     Past Surgical History:  Procedure Laterality Date   APPENDECTOMY  06/06/2015   ARTERY EXPLORATION Left 10/12/2017   Procedure: LEFT UPPER ARM EXPLORATION AND REPAIR OF BRACHIAL ARTEY USING RIGHT LEG SAPHENOUS VEIN;  Surgeon: Rosetta Posner, MD;  Location: [facility];  Service: Vascular;  Laterality: Left;   CATARACT EXTRACTION W/ INTRAOCULAR LENS  IMPLANT, BILATERAL Bilateral 2016   INGUINAL HERNIA REPAIR Bilateral    LAPAROSCOPIC APPENDECTOMY N/A 06/06/2015   Procedure: APPENDECTOMY LAPAROSCOPIC;  Surgeon: Ralene Ok, MD;  Location: [facility];  Service: General;  Laterality: N/A;   TONSILLECTOMY     VEIN HARVEST Right 10/12/2017   Procedure: SAPHENOUS VEIN HARVEST FROM RIGHT UPPER LEG;  Surgeon: Rosetta Posner, MD;  Location: [facility] OR;  Service: Vascular;  Laterality: Right;    Family History  Problem Relation Age of Onset   Prostate cancer Brother    Diabetes Daughter    Colon cancer Neg Hx     Social History:  reports that he has never smoked. He has never used smokeless tobacco. He reports current drug use. He reports that he does not drink alcohol.  Allergies: No Known  Allergies  Medications: I have reviewed the patient's current medications.  No results found for this or any previous visit (from the past 48 hour(s)).  No results found.  Review of Systems  HENT: Negative for ear discharge, ear pain, hearing loss and tinnitus.   Eyes: Negative for photophobia and pain.  Respiratory: Negative for cough and shortness of breath.   Cardiovascular: Negative for chest pain.  Gastrointestinal: Negative for abdominal pain, nausea and vomiting.  Genitourinary: Negative for dysuria, flank pain, frequency and urgency.  Musculoskeletal: Positive for arthralgias (Left hip). Negative for back pain, myalgias and neck pain.  Neurological: Negative for dizziness and headaches.  Hematological: Does not bruise/bleed easily.  Psychiatric/Behavioral: The patient is not nervous/anxious.    Blood pressure (!) 102/54, pulse (!) 50, temperature 98.3 F (36.8 C), temperature source Oral, resp. rate 18, SpO2 93 %. Physical Exam Constitutional:      General: He is not in acute distress.    Appearance: He is well-developed. He is not diaphoretic.  HENT:     Head: Normocephalic and atraumatic.  Eyes:     General: No scleral icterus.       Right eye: No discharge.        Left eye: No discharge.     Conjunctiva/sclera: Conjunctivae normal.  Cardiovascular:     Rate and Rhythm: Regular rhythm. Bradycardia  present.  Pulmonary:     Effort: Pulmonary effort is normal. No respiratory distress.  Musculoskeletal:     Cervical back: Normal range of motion.     Comments: LLE No traumatic wounds, ecchymosis, or rash  Mild TTP hip  No knee or ankle effusion  Knee stable to varus/ valgus and anterior/posterior stress  Sens DPN, SPN, TN intact  Motor EHL, ext, flex, evers 5/5  DP 2+, PT 1+, No significant edema  Skin:    General: Skin is warm and dry.  Neurological:     Mental Status: He is alert.  Psychiatric:        Behavior: Behavior normal.     Assessment/Plan: Left  hip fx -- Plan surgery likely tomorrow, surgeon TBD. Please keep NPO after MN. Multiple medical problems including dementia, prior stroke with left-sided weakness, hypertension, hyperlipidemia and diabetes -- Medicine to admit, manage, and clear. Appreciate their help.    Lisette Abu, PA-C Orthopedic Surgery [phone] 09/06/2019, 1:24 PM

## 2019-09-06 NOTE — H&P (Signed)
History and Physical    Casey Collins TDH:[id number] DOB: 01-14-1936 DOA: 09/06/2019  PCP: Sande Brothers, MD  Patient coming from: [place] have personally briefly reviewed patient's old medical records in [place]  Chief Complaint: Fall  HPI: Casey Collins is a 84 y.o. male with medical history significant of history of stroke with residual right-sided weakness, slurred speech, hypertension, hyperlipidemia, type 2 diabetes mellitus, dementia, depression, GERD, arthritis CKD stage IIIb, anemia of chronic disease presents to emergency department for evaluation of mechanical fall.  Patient tells me that he slipped on wet floor in the bathroom this morning and landed on his left side.  He could not get up and x-ray was obtained at nursing home facility which showed a femoral neck fracture therefore patient sent to [facility] for further management.  Patient reports severe pain in his left hip, 10 out of 10, denies headache, blurry vision, head trauma, loss of consciousness, seizures, lightheadedness, dizziness prior to the fall, chest pain, shortness of breath, palpitation, leg swelling, fever, chills, nausea, vomiting, diarrhea, abdominal pain, urinary or bowel changes.  He denies history of smoking, alcohol, licit drug use.  He tells me that he does not walk very well because he had a stroke in the past.  ED Course: Upon arrival to ED: Patient's heart rate was noted to be 50, blood pressure on lower side, afebrile with no leukocytosis, CBC shows anemia of chronic disease, CMP shows CKD stage IIIb, a chest x-ray came back negative, x-ray hip shows a left femoral neck fracture.  COVID-19 pending.  EDP consulted orthopedic surgery.  [facility] hospitalist consulted for admission due to multiple chronic medical issues.  Review of Systems: As per HPI otherwise negative.    Past Medical History:  Diagnosis Date  . Arthritis   . Chronic right hip pain   . Colon  polyp   . Dementia ([place])   . Depression   . Diverticulosis   . Hyperlipemia   . Hypertension   . Pneumonia X 2  . Stroke ([place]) 2010-2016    X 4,  "right side weak since; difficulty walking, speaking, read, eat" (06/06/2015)  . Type II diabetes mellitus ([place])     Past Surgical History:  Procedure Laterality Date  . APPENDECTOMY  06/06/2015  . ARTERY EXPLORATION Left 10/12/2017   Procedure: LEFT UPPER ARM EXPLORATION AND REPAIR OF BRACHIAL ARTEY USING RIGHT LEG SAPHENOUS VEIN;  Surgeon: Rosetta Posner, MD;  Location: [place] [facility];  Service: Vascular;  Laterality: Left;  . CATARACT EXTRACTION W/ INTRAOCULAR LENS  IMPLANT, BILATERAL Bilateral 2016  . INGUINAL HERNIA REPAIR Bilateral   . LAPAROSCOPIC APPENDECTOMY N/A 06/06/2015   Procedure: APPENDECTOMY LAPAROSCOPIC;  Surgeon: Ralene Ok, MD;  Location: [facility];  Service: General;  Laterality: N/A;  . TONSILLECTOMY    . VEIN HARVEST Right 10/12/2017   Procedure: SAPHENOUS VEIN HARVEST FROM RIGHT UPPER LEG;  Surgeon: Rosetta Posner, MD;  Location: [facility] OR;  Service: Vascular;  Laterality: Right;     reports that he has never smoked. He has never used smokeless tobacco. He reports current drug use. He reports that he does not drink alcohol.  No Known Allergies  Family History  Problem Relation Age of Onset  . Prostate cancer Brother   . Diabetes Daughter   . Colon cancer Neg Hx     Prior to Admission medications   Medication Sig Start Date End Date Taking? Authorizing Provider  acetaminophen (TYLENOL) 500 MG tablet  Take 500 mg by mouth every 4 (four) hours as needed for mild pain or moderate pain.    Yes [provider]  amLODipine (NORVASC) 5 MG tablet Take 1 tablet (5 mg total) by mouth daily. 01/26/19  Yes Hosie Poisson, MD  Ascorbic Acid (VITAMIN C) 1000 MG tablet Take 1 tablet (1,000 mg total) by mouth daily. 11/07/17  Yes Angiulli, Lavon Paganini, PA-C  aspirin EC 81 MG tablet Take 81 mg by mouth daily.   Yes [provider]    atorvastatin (LIPITOR) 40 MG tablet Take 1 tablet (40 mg total) by mouth daily at 6 PM. 01/25/19  Yes Hosie Poisson, MD  citalopram (CELEXA) 10 MG tablet Take 1 tablet (10 mg total) by mouth daily. 11/07/17  Yes Angiulli, Lavon Paganini, PA-C  clopidogrel (PLAVIX) 75 MG tablet Take 1 tablet (75 mg total) by mouth daily. 11/07/17  Yes Angiulli, Lavon Paganini, PA-C  donepezil (ARICEPT) 5 MG tablet Take 5 mg by mouth at bedtime. 08/24/19  Yes [provider]  furosemide (LASIX) 40 MG tablet Take 40 mg by mouth daily.   Yes [provider]  gabapentin (NEURONTIN) 300 MG capsule Take 300 mg by mouth 3 (three) times daily. 11/12/18  Yes [provider]  HYDROcodone-acetaminophen (NORCO/VICODIN) 5-325 MG tablet Take 1 tablet by mouth every 4 (four) hours as needed. Patient taking differently: Take 1 tablet by mouth every 4 (four) hours as needed for moderate pain.  02/03/19  Yes Fondaw, Wylder S, PA  hydrOXYzine (VISTARIL) 25 MG/ML injection Inject 25 mg into the muscle every 6 (six) hours as needed (aggression).   Yes [provider]  Insulin Glargine (BASAGLAR KWIKPEN) 100 UNIT/ML Inject 10 Units into the skin every 12 (twelve) hours.    Yes [provider]  insulin regular (NOVOLIN R) 100 units/mL injection Inject 5 Units into the skin 2 (two) times daily with a meal. With breakfast and dinner   Yes [provider]  isosorbide mononitrate (IMDUR) 30 MG 24 hr tablet Take 1 tablet (30 mg total) by mouth daily. 01/26/19  Yes Hosie Poisson, MD  lisinopril (ZESTRIL) 10 MG tablet TAKE 1 TABLET BY MOUTH DAILY Patient taking differently: Take 10 mg by mouth daily.  10/12/18  Yes Miquel Dunn, NP  Melatonin 3 MG TABS Take 1 tablet by mouth at bedtime as needed (sleep).    Yes [provider]  Multiple Vitamin (DAILY-VITE PO) Take 1 tablet by mouth in the morning.   Yes [provider]  ondansetron (ZOFRAN ODT) 4 MG disintegrating tablet 4mg  ODT  q4 hours prn nausea/vomit Patient taking differently: Take 4 mg by mouth every 4 (four) hours as needed for nausea or vomiting (DISSOLVE IN THE MOUTH).  12/01/18  Yes Milton Ferguson, MD  potassium chloride (KLOR-CON) 10 MEQ tablet Take 10 mEq by mouth daily.   Yes [provider]  tamsulosin (FLOMAX) 0.4 MG CAPS capsule Take 0.4 mg by mouth at bedtime.   Yes [provider]  tiZANidine (ZANAFLEX) 2 MG tablet Take 2 mg by mouth every 8 (eight) hours as needed for muscle spasms.    Yes [provider]  cephALEXin (KEFLEX) 500 MG capsule Take 1 capsule (500 mg total) by mouth 2 (two) times daily. Patient not taking: Reported on 04/25/2019 04/08/19   Ripley Fraise, MD  insulin glargine (LANTUS) 100 UNIT/ML injection Inject 0.05 mLs (5 Units total) into the skin 2 (two) times daily. Patient not taking: Reported on 04/07/2019 01/25/19  Hosie Poisson, MD  LORazepam (ATIVAN) 0.5 MG tablet Take 1 tablet (0.5 mg total) by mouth 3 (three) times daily as needed for anxiety. Patient not taking: Reported on 09/06/2019 01/25/19   Hosie Poisson, MD    Physical Exam: Vitals:   09/06/19 1256  BP: (!) 102/54  Pulse: (!) 50  Resp: 18  Temp: 98.3 F (36.8 C)  TempSrc: Oral  SpO2: 93%    Constitutional: NAD, calm, comfortable, has slurred speech, alert and oriented x3, he answers slowly however appropriately. Eyes: PERRL, lids and conjunctivae normal ENMT: Mucous membranes are moist. Posterior pharynx clear of any exudate or lesions.Normal dentition.  Neck: normal, supple, no masses, no thyromegaly Respiratory: clear to auscultation bilaterally, no wheezing, no crackles. Normal respiratory effort. No accessory muscle use.  Cardiovascular: Regular rate and rhythm, no murmurs / rubs / gallops. No extremity edema. 2+ pedal pulses. No carotid bruits.  Abdomen: no tenderness, no masses palpated. No hepatosplenomegaly. Bowel sounds positive.  Musculoskeletal: Left leg: Shortened and  externally rotated.  Sensation intact. Skin: no rashes, lesions, ulcers. No induration Neurologic: Has residual left-sided facial droop, slurred speech from previous stroke. psychiatric: Normal judgment and insight. Alert and oriented x 3. Normal mood.    Labs on Admission: I have personally reviewed following labs and imaging studies  CBC: Recent Labs  Lab 09/06/19 1315  WBC 10.3  NEUTROABS 7.8*  HGB 11.2*  HCT 36.6*  MCV 95.1  PLT 696   Basic Metabolic Panel: Recent Labs  Lab 09/06/19 1315  NA 139  K 4.3  CL 103  CO2 28  GLUCOSE 87  BUN 38*  CREATININE 1.99*  CALCIUM 9.0   GFR: CrCl cannot be calculated (Unknown ideal weight.). Liver Function Tests: No results for input(s): AST, ALT, ALKPHOS, BILITOT, PROT, ALBUMIN in the last 168 hours. No results for input(s): LIPASE, AMYLASE in the last 168 hours. No results for input(s): AMMONIA in the last 168 hours. Coagulation Profile: Recent Labs  Lab 09/06/19 1315  INR 1.2   Cardiac Enzymes: No results for input(s): CKTOTAL, CKMB, CKMBINDEX, TROPONINI in the last 168 hours. BNP (last 3 results) No results for input(s): PROBNP in the last 8760 hours. HbA1C: No results for input(s): HGBA1C in the last 72 hours. CBG: No results for input(s): GLUCAP in the last 168 hours. Lipid Profile: No results for input(s): CHOL, HDL, LDLCALC, TRIG, CHOLHDL, LDLDIRECT in the last 72 hours. Thyroid Function Tests: No results for input(s): TSH, T4TOTAL, FREET4, T3FREE, THYROIDAB in the last 72 hours. Anemia Panel: No results for input(s): VITAMINB12, FOLATE, FERRITIN, TIBC, IRON, RETICCTPCT in the last 72 hours. Urine analysis:    Component Value Date/Time   COLORURINE YELLOW 05/01/2019 0545   APPEARANCEUR CLEAR 05/01/2019 0545   LABSPEC 1.012 05/01/2019 0545   PHURINE 5.0 05/01/2019 0545   GLUCOSEU NEGATIVE 05/01/2019 0545   HGBUR NEGATIVE 05/01/2019 0545   [place] 05/01/2019 0545   KETONESUR NEGATIVE  05/01/2019 0545   PROTEINUR NEGATIVE 05/01/2019 0545   UROBILINOGEN 1.0 12/09/2012 1651   NITRITE NEGATIVE 05/01/2019 0545   LEUKOCYTESUR NEGATIVE 05/01/2019 0545    Radiological Exams on Admission: DG Chest Port 1 View  Result Date: 09/06/2019 CLINICAL DATA:  Fellow early this morning with a resultant left hip fracture. EXAM: PORTABLE CHEST 1 VIEW COMPARISON:  05/06/2019 FINDINGS: Stable enlarged cardiac silhouette and tortuous aorta. Clear lungs with normal vascularity. No acute bony abnormality. IMPRESSION: No acute abnormality. Stable cardiomegaly. Electronically Signed   By: Percell Locus.D.  On: 09/06/2019 14:00   DG Hip Unilat With Pelvis 2-3 Views Left  Result Date: 09/06/2019 CLINICAL DATA:  Left hip pain following a fall this morning. Known left hip fracture. EXAM: DG HIP (WITH OR WITHOUT PELVIS) 2-3V LEFT COMPARISON:  Abdomen and pelvis CT dated 05/01/2019 FINDINGS: Interval left femoral neck fracture with mild valgus angulation. No significant displacement. Previously demonstrated right hip degenerative changes. Atheromatous arterial calcifications. IMPRESSION: Interval left femoral neck fracture with mild valgus angulation. Electronically Signed   By: Claudie Revering M.D.   On: 09/06/2019 14:00    EKG: Independently reviewed.  Sinus bradycardia, no ST elevation or depression noted.   Assessment/Plan Principal Problem:   Femur neck fracture (HCC) Active Problems:   H/O: CVA (cerebrovascular accident)   Diabetes mellitus type 2 in nonobese (HCC)   Benign essential HTN   CKD (chronic kidney disease) stage 3, GFR 30-59 ml/min   Sinus bradycardia   Hyperlipemia   Dementia (HCC)   Depression   Anemia due to chronic kidney disease   Fall at home, initial encounter   Femur fracture, left ([place])   Left femur neck fracture: -Status post fall. -Reviewed x-ray.  COVID-19 is pending.  He is afebrile with no leukocytosis. -Admit patient on the floor. -EDP consulted orthopedic  surgery-possible surgery tomorrow a.m. -Tylenol/oxycodone/Dilaudid as needed for mild/moderate/severe pain respectively. -Monitor H&H.  Monitor vitals closely. -N.p.o. after midnight.  Hypertension: Blood pressure is on lower side -We will hold home BP meds for now-amlodipine, Imdur, lisinopril.  Monitor blood pressure closely  Type 2 diabetes mellitus: Check A1c -Start patient on sliding scale insulin.  Continue home Lantus.  Hyperlipidemia: Continue statin  BPH: Continue Flomax  Depression/anxiety: Continue Celexa, Ativan as needed  Diabetic neuropathy: Continue gabapentin  History of CVA: With right-sided residual weakness and slurred speech.  Continue aspirin, statin, hold Plavix.  Dementia: Continue Aricept  Anemia of chronic disease: H&H is stable -Continue to monitor  CKD stage IIIb: At baseline -Continue to monitor  Chronic combined systolic and diastolic CHF: Patient appears euvolemic on exam -Reviewed echo from 01/22/2019 which showed ejection fraction of 50% and grade 1 diastolic dysfunction. -Hold Lasix and Imdur for now due to low blood pressure -Strict INO's and daily weight.  Monitor electrolytes. -Resume home meds once patient's blood pressure is back to baseline  Sinus bradycardia: Chronic -Reviewed home meds.  Patient's not on beta-blocker. -Check TSH.  On telemetry.  Unable to safely start patient's home medication as med reconciliation is pending by pharmacy.  DVT prophylaxis: Lovenox/SCD Code Status: Full code Family Communication: None present at bedside.  Plan of care discussed with patient in length and he verbalized understanding and agreed with it. Disposition Plan: SNF after surgery Consults called: Orthopedic surgery by EDP  admission status: Inpatient   Mckinley Jewel MD [facility]  If 7PM-7AM, please contact night-coverage [email or web address] Password [email or web address]  09/06/2019, 2:40 PM
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
## 2019-09-06 NOTE — ED Triage Notes (Signed)
Pt presents from [facility] via [facility] for mechanical unwitnessed fall from standing in bathroom this AM at 2am. [place] on L hip, did not hit head, no neck/back pain. Imaging obtained at facility revealed L hip fx, sent to ED d/t this. 8/10 pain at L hip. H/o stroke with L sided def and speech def.   Alert and oriented to all but month (can give year).  2L provide by EMS for spO2 90% on RA, [id number]

## 2019-09-06 NOTE — ED Notes (Signed)
Patients son in law, Gay Filler would like to be called with updates [phone]
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
## 2019-09-07 ENCOUNTER — Inpatient Hospital Stay (HOSPITAL_COMMUNITY): Payer: Medicare Other

## 2019-09-07 ENCOUNTER — Encounter (HOSPITAL_COMMUNITY): Payer: Self-pay | Admitting: Family Medicine

## 2019-09-07 DIAGNOSIS — Z8673 Personal history of transient ischemic attack (TIA), and cerebral infarction without residual deficits: Secondary | ICD-10-CM

## 2019-09-07 DIAGNOSIS — S72002A Fracture of unspecified part of neck of left femur, initial encounter for closed fracture: Secondary | ICD-10-CM | POA: Diagnosis present

## 2019-09-07 DIAGNOSIS — R001 Bradycardia, unspecified: Secondary | ICD-10-CM

## 2019-09-07 DIAGNOSIS — E785 Hyperlipidemia, unspecified: Secondary | ICD-10-CM

## 2019-09-07 DIAGNOSIS — F0151 Vascular dementia with behavioral disturbance: Secondary | ICD-10-CM | POA: Diagnosis not present

## 2019-09-07 DIAGNOSIS — N1832 Chronic kidney disease, stage 3b: Secondary | ICD-10-CM | POA: Diagnosis not present

## 2019-09-07 DIAGNOSIS — I10 Essential (primary) hypertension: Secondary | ICD-10-CM | POA: Diagnosis not present

## 2019-09-07 LAB — URINALYSIS, ROUTINE W REFLEX MICROSCOPIC
Bilirubin Urine: NEGATIVE
Glucose, UA: NEGATIVE mg/dL
Hgb urine dipstick: NEGATIVE
Ketones, ur: NEGATIVE mg/dL
Leukocytes,Ua: NEGATIVE
Nitrite: NEGATIVE
Protein, ur: NEGATIVE mg/dL
Specific Gravity, Urine: 1.01 (ref 1.005–1.030)
pH: 5 (ref 5.0–8.0)

## 2019-09-07 LAB — CBC
HCT: 39.2 % (ref 39.0–52.0)
Hemoglobin: 12.2 g/dL — ABNORMAL LOW (ref 13.0–17.0)
MCH: 29.3 pg (ref 26.0–34.0)
MCHC: 31.1 g/dL (ref 30.0–36.0)
MCV: 94.2 fL (ref 80.0–100.0)
Platelets: 199 10*3/uL (ref 150–400)
RBC: 4.16 MIL/uL — ABNORMAL LOW (ref 4.22–5.81)
RDW: 14.2 % (ref 11.5–15.5)
WBC: 9.7 10*3/uL (ref 4.0–10.5)
nRBC: 0 % (ref 0.0–0.2)

## 2019-09-07 LAB — COMPREHENSIVE METABOLIC PANEL
ALT: 16 U/L (ref 0–44)
AST: 19 U/L (ref 15–41)
Albumin: 3.6 g/dL (ref 3.5–5.0)
Alkaline Phosphatase: 89 U/L (ref 38–126)
Anion gap: 12 (ref 5–15)
BUN: 32 mg/dL — ABNORMAL HIGH (ref 8–23)
CO2: 28 mmol/L (ref 22–32)
Calcium: 9.1 mg/dL (ref 8.9–10.3)
Chloride: 101 mmol/L (ref 98–111)
Creatinine, Ser: 1.57 mg/dL — ABNORMAL HIGH (ref 0.61–1.24)
GFR calc Af Amer: 47 mL/min — ABNORMAL LOW (ref >60)
GFR calc non Af Amer: 40 mL/min — ABNORMAL LOW (ref >60)
Glucose, Bld: 121 mg/dL — ABNORMAL HIGH (ref 70–99)
Potassium: 4 mmol/L (ref 3.5–5.1)
Sodium: 141 mmol/L (ref 135–145)
Total Bilirubin: 0.8 mg/dL (ref 0.3–1.2)
Total Protein: 6.5 g/dL (ref 6.5–8.1)

## 2019-09-07 LAB — GLUCOSE, CAPILLARY: Glucose-Capillary: 102 mg/dL — ABNORMAL HIGH (ref 70–99)

## 2019-09-07 LAB — SURGICAL PCR SCREEN
MRSA, PCR: NEGATIVE
Staphylococcus aureus: NEGATIVE

## 2019-09-07 LAB — HEMOGLOBIN A1C
Hgb A1c MFr Bld: 6.9 % — ABNORMAL HIGH (ref 4.8–5.6)
Mean Plasma Glucose: 151.33 mg/dL

## 2019-09-07 LAB — CBG MONITORING, ED: Glucose-Capillary: 145 mg/dL — ABNORMAL HIGH (ref 70–99)

## 2019-09-07 MED ORDER — HYDROMORPHONE HCL 1 MG/ML IJ SOLN
0.5000 mg | INTRAMUSCULAR | Status: DC | PRN
  Administered 2019-09-08 (×5): 0.5 mg via INTRAVENOUS
  Filled 2019-09-07 (×5): qty 0.5

## 2019-09-07 MED ORDER — DONEPEZIL HCL 10 MG PO TABS
5.0000 mg | ORAL_TABLET | Freq: Every day | ORAL | Status: DC
  Administered 2019-09-07 – 2019-09-09 (×3): 5 mg via ORAL
  Filled 2019-09-07 (×5): qty 1

## 2019-09-07 MED ORDER — HYDROMORPHONE HCL 1 MG/ML IJ SOLN
0.5000 mg | INTRAMUSCULAR | Status: DC | PRN
Start: 2019-09-07 — End: 2019-09-07

## 2019-09-07 MED ORDER — AMLODIPINE BESYLATE 5 MG PO TABS
5.0000 mg | ORAL_TABLET | Freq: Every day | ORAL | Status: DC
  Administered 2019-09-07: 5 mg via ORAL
  Filled 2019-09-07: qty 1

## 2019-09-07 MED ORDER — TAMSULOSIN HCL 0.4 MG PO CAPS
0.4000 mg | ORAL_CAPSULE | Freq: Every day | ORAL | Status: DC
  Administered 2019-09-07: 0.4 mg via ORAL
  Filled 2019-09-07: qty 1

## 2019-09-07 MED ORDER — ISOSORBIDE MONONITRATE ER 30 MG PO TB24
30.0000 mg | ORAL_TABLET | Freq: Every day | ORAL | Status: DC
  Administered 2019-09-07 – 2019-09-10 (×2): 30 mg via ORAL
  Filled 2019-09-07 (×2): qty 1

## 2019-09-07 MED ORDER — LORAZEPAM 2 MG/ML IJ SOLN
0.5000 mg | Freq: Once | INTRAMUSCULAR | Status: AC | PRN
  Administered 2019-09-07: 0.5 mg via INTRAVENOUS
  Filled 2019-09-07: qty 1

## 2019-09-07 MED ORDER — CITALOPRAM HYDROBROMIDE 20 MG PO TABS
10.0000 mg | ORAL_TABLET | Freq: Every day | ORAL | Status: DC
  Administered 2019-09-07 – 2019-09-10 (×3): 10 mg via ORAL
  Filled 2019-09-07 (×3): qty 1

## 2019-09-07 MED ORDER — INSULIN ASPART 100 UNIT/ML ~~LOC~~ SOLN
0.0000 [IU] | Freq: Three times a day (TID) | SUBCUTANEOUS | Status: DC
  Administered 2019-09-07 – 2019-09-08 (×2): 1 [IU] via SUBCUTANEOUS
  Administered 2019-09-09: 3 [IU] via SUBCUTANEOUS
  Administered 2019-09-09: 5 [IU] via SUBCUTANEOUS
  Administered 2019-09-09 – 2019-09-10 (×2): 2 [IU] via SUBCUTANEOUS

## 2019-09-07 NOTE — ED Notes (Signed)
Pt's son-in-law, Octavia Bruckner given telephone update on plans for surgery today, and pt's current status

## 2019-09-07 NOTE — ED Notes (Signed)
Lunch tray ordered for pt, as he can now eat. NPO p MN for surgery tomorrow at [facility] per OR nurse

## 2019-09-07 NOTE — ED Notes (Signed)
Called and updated Casey Collins, pt's son-in-law on plans for [facility] transfer and surgery tomorrow

## 2019-09-07 NOTE — Progress Notes (Signed)
PROGRESS NOTE    Antonius Hartlage  QPR:[id number] DOB: [birth year] DOA: 09/06/2019 PCP: Sande Brothers, MD   Brief Narrative: Casey Collins is a 84 y.o. male with medical history significant of history of stroke with residual right-sided weakness, slurred speech, hypertension, hyperlipidemia, type 2 diabetes mellitus, dementia, depression, GERD, arthritis CKD stage IIIb, anemia of chronic disease. Patient presented after having a fall and suffering a left femur fracture. Plan for surgery/  Assessment & Plan:   Principal Problem:   Femur neck fracture (HCC) Active Problems:   H/O: CVA (cerebrovascular accident)   Diabetes mellitus type 2 in nonobese (HCC)   Benign essential HTN   CKD (chronic kidney disease) stage 3, GFR 30-59 ml/min   Sinus bradycardia   Hyperlipemia   Dementia (HCC)   Depression   Anemia due to chronic kidney disease   Fall at home, initial encounter   Femur fracture, left ([place])   Left femur fracture Orthopedic surgery consulted with plan for repair on 8/11. Will transfer to [facility] for surgery. NPO after midnight. -Reduce to dilaudid 0.5 mg IV prn  Essential hypertension Patient is on Imdur, amlodipine and lisinopril as an outpatient. Blood pressure uncontrolled. -Restart home Imdur and amlodipine -Hold lisinopril for now in setting of AKI; restart if BP continues to be uncontrolled and AKI continues to be resolved  Diabetes mellitus, type 2 Patient is on Lantus 10 units BID (listed as not taking on med list) and Novolin 5 units BID with a meal. Hemoglobin A1C of 7.1 from 12/2018. CBG controlled without insulin overnight -SSI sensitive  Hyperlipidemia On Lipitor 40 mg as an outpatient -Resume home Lipitor  BPH Patient is on Flomax as an outpatient -Resume Flomax  Vascular dementia Patient is on Aricept as an outpatient  Diabetic neuropathy Patient is on gabapentin as an outpatient -Hold gabapentin until mental status  improved  Depression/anxiety Patient is on Celexa as an outpatient. -Continue Celexa  History of CVA Appears to have chronic dysarthria and left hemiplegia/hemiparesis. Patient is on Plavix and aspirin as an outpatient. -Hold aspirin/plavix pending recommendations for resumption by orthopedic surgery  Anemia of chronic disease In setting of chronic kidney disease. Currently stable.  AKI on CKD stage IIIb Baseline creatinine appears to be around 1.5. Creatinine of 1.99 on admission and down to baseline today.  Chronic combined systolic and diastolic heart failure Last EF of 50% with associated grade 1 diastolic on Transthoracic Echocardiogram from 12/2018. Patient is on Lasix and lisinopril as an outpatient -Watch UOP and weights  Sinus bradycardia Chronic.    DVT prophylaxis: SCDs Code Status:   Code Status: Full Code Family Communication: None at bedside Disposition Plan: Discharge back to facility pending orthopedic surgery recommendations/management of femur fracture   Consultants:   Orthopedic surgery  Procedures:   None  Antimicrobials:  None    Subjective: Patient barely able to participate but does state he has pain of his left hip.  Objective: Vitals:   09/07/19 0815 09/07/19 0921 09/07/19 0950 09/07/19 1000  BP: (!) 176/76 (!) 161/74 (!) 161/73 (!) 170/77  Pulse: 65 60 (!) 56 (!) 58  Resp: 17 20 14 14   Temp:      TempSrc:      SpO2: 98% 98% 100% 100%    Intake/Output Summary (Last 24 hours) at 09/07/2019 1247 Last data filed at 09/06/2019 1411 Gross per 24 hour  Intake 500 ml  Output --  Net 500 ml   There were no vitals filed for this visit.  Examination:  General exam: Appears calm and comfortable  Respiratory system: Clear to auscultation. Respiratory effort normal. Cardiovascular system: S1 & S2 heard, RRR. No murmurs, rubs, gallops or clicks. Gastrointestinal system: Abdomen is nondistended, soft and nontender. No organomegaly or  masses felt. Normal bowel sounds heard. Central nervous system: Lethargic but arouses. Pinpoint pupils. Dysarthria. Musculoskeletal: No edema. No calf tenderness Skin: No cyanosis. No rashes Psychiatry: Judgement and insight appear imapired.    Data Reviewed: I have personally reviewed following labs and imaging studies  CBC Lab Results  Component Value Date   WBC 9.7 09/07/2019   RBC 4.16 (L) 09/07/2019   HGB 12.2 (L) 09/07/2019   HCT 39.2 09/07/2019   MCV 94.2 09/07/2019   MCH 29.3 09/07/2019   PLT 199 09/07/2019   MCHC 31.1 09/07/2019   RDW 14.2 09/07/2019   LYMPHSABS 1.1 09/06/2019   MONOABS 1.0 09/06/2019   EOSABS 0.3 09/06/2019   BASOSABS 0.0 66/06/3014     Last metabolic panel Lab Results  Component Value Date   NA 141 09/07/2019   K 4.0 09/07/2019   CL 101 09/07/2019   CO2 28 09/07/2019   BUN 32 (H) 09/07/2019   CREATININE 1.57 (H) 09/07/2019   GLUCOSE 121 (H) 09/07/2019   GFRNONAA 40 (L) 09/07/2019   GFRAA 47 (L) 09/07/2019   CALCIUM 9.1 09/07/2019   PHOS 3.8 09/06/2019   PROT 6.5 09/07/2019   ALBUMIN 3.6 09/07/2019   BILITOT 0.8 09/07/2019   ALKPHOS 89 09/07/2019   AST 19 09/07/2019   ALT 16 09/07/2019   ANIONGAP 12 09/07/2019    CBG (last 3)  No results for input(s): GLUCAP in the last 72 hours.   GFR: CrCl cannot be calculated (Unknown ideal weight.).  Coagulation Profile: Recent Labs  Lab 09/06/19 1315  INR 1.2    Recent Results (from the past 240 hour(s))  SARS Coronavirus 2 by RT PCR (hospital order, performed in [facility] hospital lab) Nasopharyngeal Nasopharyngeal Swab     Status: None   Collection Time: 09/06/19  1:15 PM   Specimen: Nasopharyngeal Swab  Result Value Ref Range Status   SARS Coronavirus 2 NEGATIVE NEGATIVE Final    Comment: (NOTE) SARS-CoV-2 target nucleic acids are NOT DETECTED.  The SARS-CoV-2 RNA is generally detectable in upper and lower respiratory specimens during the acute phase of infection. The  lowest concentration of SARS-CoV-2 viral copies this assay can detect is 250 copies / mL. A negative result does not preclude SARS-CoV-2 infection and should not be used as the sole basis for treatment or other patient management decisions.  A negative result may occur with improper specimen collection / handling, submission of specimen other than nasopharyngeal swab, presence of viral mutation(s) within the areas targeted by this assay, and inadequate number of viral copies (<250 copies / mL). A negative result must be combined with clinical observations, patient history, and epidemiological information.  Fact Sheet for Patients:   StrictlyIdeas.no  Fact Sheet for Healthcare Providers: BankingDealers.co.za  This test is not yet approved or  cleared by the [place] FDA and has been authorized for detection and/or diagnosis of SARS-CoV-2 by FDA under an Emergency Use Authorization (EUA).  This EUA will remain in effect (meaning this test can be used) for the duration of the COVID-19 declaration under Section 564(b)(1) of the Act, 21 U.S.C. section 360bbb-3(b)(1), unless the authorization is terminated or revoked sooner.  Performed at [facility], [place] [address], [place], [place] [address]  Radiology Studies: CT HIP LEFT WO CONTRAST  Result Date: 09/07/2019 CLINICAL DATA:  Evaluate left hip fracture EXAM: CT OF THE LEFT HIP WITHOUT CONTRAST TECHNIQUE: Multidetector CT imaging of the left hip was performed according to the standard protocol. Multiplanar CT image reconstructions were also generated. COMPARISON:  X-ray 09/06/2019 FINDINGS: Bones/Joint/Cartilage Acute transcervical fracture of the left femoral neck with mild impaction upon the lateral fracture margin (series 14, image 46). No significant displacement or angulation. Fracture line does not appear to involve the femoral head articular surface or the  intertrochanteric region. Left hip joint is intact without dislocation. Mild to moderate left hip arthropathy. Small to moderate hip joint effusion. Visualized portion of the left hemipelvis is intact. Ligaments Suboptimally assessed by CT. Muscles and Tendons No acute musculotendinous abnormality by CT. Soft tissues No soft tissue fluid collection or hematoma. Vascular calcifications. IMPRESSION: 1. Acute mildly impacted transcervical fracture of the left femoral neck. 2. Small to moderate left hip joint effusion. 3. Mild to moderate left hip arthropathy. Electronically Signed   By: Davina Poke D.O.   On: 09/07/2019 10:33   DG Chest Port 1 View  Result Date: 09/06/2019 CLINICAL DATA:  Fellow early this morning with a resultant left hip fracture. EXAM: PORTABLE CHEST 1 VIEW COMPARISON:  05/06/2019 FINDINGS: Stable enlarged cardiac silhouette and tortuous aorta. Clear lungs with normal vascularity. No acute bony abnormality. IMPRESSION: No acute abnormality. Stable cardiomegaly. Electronically Signed   By: Claudie Revering M.D.   On: 09/06/2019 14:00   DG Knee Left Port  Result Date: 09/07/2019 CLINICAL DATA:  Left knee pain. EXAM: PORTABLE LEFT KNEE - 1-2 VIEW COMPARISON:  None. FINDINGS: Minimal degenerative changes for age. No acute fracture, osteochondral lesion or chondrocalcinosis. No joint effusion. Vascular calcifications are noted. IMPRESSION: Minimal degenerative changes for age. No acute bony findings or joint effusion. Electronically Signed   By: Marijo Sanes M.D.   On: 09/07/2019 09:33   DG Hip Unilat With Pelvis 2-3 Views Left  Result Date: 09/06/2019 CLINICAL DATA:  Left hip pain following a fall this morning. Known left hip fracture. EXAM: DG HIP (WITH OR WITHOUT PELVIS) 2-3V LEFT COMPARISON:  Abdomen and pelvis CT dated 05/01/2019 FINDINGS: Interval left femoral neck fracture with mild valgus angulation. No significant displacement. Previously demonstrated right hip degenerative changes.  Atheromatous arterial calcifications. IMPRESSION: Interval left femoral neck fracture with mild valgus angulation. Electronically Signed   By: Claudie Revering M.D.   On: 09/06/2019 14:00        Scheduled Meds: . enoxaparin (LOVENOX) injection  40 mg Subcutaneous Q24H   Continuous Infusions: . sodium chloride 75 mL/hr at 09/06/19 1848     LOS: 1 day     Cordelia Poche, MD [facility] 09/07/2019, 12:47 PM  If 7PM-7AM, please contact night-coverage [email or web address]

## 2019-09-07 NOTE — Progress Notes (Signed)
Patient ID: Casey Collins, male   DOB: 10-31-1935, 84 y.o.   MRN: [id number]  Very sleepy this am after receiving pain meds. Couldn't get him to participate in meaningful conversation  Left femoral neck fracture  Stuck in ER  Will need operative stabilization versus hip arthroplasty I have asked one of my partners to facilitate getting him to the OR sooner than I would be able, Dr. Lyla Glassing to assume care and will determine timing for surgery, likely tomorrow  See full consult from yesterday

## 2019-09-07 NOTE — ED Notes (Signed)
Please call pt son Gay Filler with an update about surgery [phone]

## 2019-09-07 NOTE — ED Notes (Signed)
Pt's lunch tray set up. Feeding self

## 2019-09-07 NOTE — ED Notes (Signed)
New IV started LFA, as pt kept bending RAC IV, occluding fluids

## 2019-09-08 ENCOUNTER — Inpatient Hospital Stay (HOSPITAL_COMMUNITY): Payer: Medicare Other | Admitting: Anesthesiology

## 2019-09-08 ENCOUNTER — Encounter (HOSPITAL_COMMUNITY)
Admission: EM | Disposition: A | Discharge status: Skilled Nursing Facility | Payer: Self-pay | Source: Skilled Nursing Facility | Attending: Internal Medicine

## 2019-09-08 ENCOUNTER — Inpatient Hospital Stay (HOSPITAL_COMMUNITY): Payer: Medicare Other

## 2019-09-08 ENCOUNTER — Encounter (HOSPITAL_COMMUNITY): Payer: Self-pay | Admitting: Family Medicine

## 2019-09-08 DIAGNOSIS — S72002A Fracture of unspecified part of neck of left femur, initial encounter for closed fracture: Secondary | ICD-10-CM | POA: Diagnosis not present

## 2019-09-08 HISTORY — PX: ANTERIOR APPROACH HEMI HIP ARTHROPLASTY: SHX6690

## 2019-09-08 LAB — BASIC METABOLIC PANEL
Anion gap: 9 (ref 5–15)
BUN: 29 mg/dL — ABNORMAL HIGH (ref 8–23)
CO2: 25 mmol/L (ref 22–32)
Calcium: 8.5 mg/dL — ABNORMAL LOW (ref 8.9–10.3)
Chloride: 104 mmol/L (ref 98–111)
Creatinine, Ser: 1.39 mg/dL — ABNORMAL HIGH (ref 0.61–1.24)
GFR calc Af Amer: 54 mL/min — ABNORMAL LOW (ref >60)
GFR calc non Af Amer: 47 mL/min — ABNORMAL LOW (ref >60)
Glucose, Bld: 154 mg/dL — ABNORMAL HIGH (ref 70–99)
Potassium: 4.2 mmol/L (ref 3.5–5.1)
Sodium: 138 mmol/L (ref 135–145)

## 2019-09-08 LAB — GLUCOSE, CAPILLARY
Glucose-Capillary: 137 mg/dL — ABNORMAL HIGH (ref 70–99)
Glucose-Capillary: 101 mg/dL — ABNORMAL HIGH (ref 70–99)
Glucose-Capillary: 101 mg/dL — ABNORMAL HIGH (ref 70–99)
Glucose-Capillary: 192 mg/dL — ABNORMAL HIGH (ref 70–99)
Glucose-Capillary: 289 mg/dL — ABNORMAL HIGH (ref 70–99)

## 2019-09-08 LAB — CBC
HCT: 35.8 % — ABNORMAL LOW (ref 39.0–52.0)
Hemoglobin: 11.4 g/dL — ABNORMAL LOW (ref 13.0–17.0)
MCH: 30.3 pg (ref 26.0–34.0)
MCHC: 31.8 g/dL (ref 30.0–36.0)
MCV: 95.2 fL (ref 80.0–100.0)
Platelets: 176 10*3/uL (ref 150–400)
RBC: 3.76 MIL/uL — ABNORMAL LOW (ref 4.22–5.81)
RDW: 14.3 % (ref 11.5–15.5)
WBC: 9.6 10*3/uL (ref 4.0–10.5)
nRBC: 0 % (ref 0.0–0.2)

## 2019-09-08 LAB — POCT I-STAT, CHEM 8
BUN: 24 mg/dL — ABNORMAL HIGH (ref 8–23)
Calcium, Ion: 1.21 mmol/L (ref 1.15–1.40)
Chloride: 103 mmol/L (ref 98–111)
Creatinine, Ser: 1.6 mg/dL — ABNORMAL HIGH (ref 0.61–1.24)
Glucose, Bld: 146 mg/dL — ABNORMAL HIGH (ref 70–99)
HCT: 30 % — ABNORMAL LOW (ref 39.0–52.0)
Hemoglobin: 10.2 g/dL — ABNORMAL LOW (ref 13.0–17.0)
Potassium: 4.4 mmol/L (ref 3.5–5.1)
Sodium: 141 mmol/L (ref 135–145)
TCO2: 26 mmol/L (ref 22–32)

## 2019-09-08 LAB — TYPE AND SCREEN
ABO/RH(D): A POS
Antibody Screen: NEGATIVE

## 2019-09-08 SURGERY — HEMIARTHROPLASTY, HIP, DIRECT ANTERIOR APPROACH, FOR FRACTURE
Anesthesia: General | Laterality: Left

## 2019-09-08 MED ORDER — KETOROLAC TROMETHAMINE 30 MG/ML IJ SOLN
INTRAMUSCULAR | Status: DC | PRN
  Administered 2019-09-08: 30 mg via INTRA_ARTICULAR

## 2019-09-08 MED ORDER — SODIUM CHLORIDE 0.9 % IR SOLN
Status: DC | PRN
  Administered 2019-09-08: 3000 mL

## 2019-09-08 MED ORDER — KETOROLAC TROMETHAMINE 30 MG/ML IJ SOLN
INTRAMUSCULAR | Status: AC
  Filled 2019-09-08: qty 1

## 2019-09-08 MED ORDER — HYDROXYZINE HCL 25 MG/ML IM SOLN: 25.0000 mg | Freq: Four times a day (QID) | INTRAMUSCULAR | Status: DC | PRN

## 2019-09-08 MED ORDER — FENTANYL CITRATE (PF) 100 MCG/2ML IJ SOLN
INTRAMUSCULAR | Status: AC
  Filled 2019-09-08: qty 2

## 2019-09-08 MED ORDER — PHENYLEPHRINE HCL (PRESSORS) 10 MG/ML IV SOLN
INTRAVENOUS | Status: AC
  Filled 2019-09-08: qty 2

## 2019-09-08 MED ORDER — SODIUM CHLORIDE (PF) 0.9 % IJ SOLN
INTRAMUSCULAR | Status: DC | PRN
  Administered 2019-09-08: 30 mL

## 2019-09-08 MED ORDER — FENTANYL CITRATE (PF) 100 MCG/2ML IJ SOLN
INTRAMUSCULAR | Status: DC | PRN
  Administered 2019-09-08 (×2): 50 ug via INTRAVENOUS
  Administered 2019-09-08: 25 ug via INTRAVENOUS
  Administered 2019-09-08 (×2): 50 ug via INTRAVENOUS
  Administered 2019-09-08: 25 ug via INTRAVENOUS
  Administered 2019-09-08: 50 ug via INTRAVENOUS

## 2019-09-08 MED ORDER — ASPIRIN 81 MG PO CHEW
81.0000 mg | CHEWABLE_TABLET | Freq: Two times a day (BID) | ORAL | Status: DC
  Administered 2019-09-09 – 2019-09-10 (×3): 81 mg via ORAL
  Filled 2019-09-08 (×3): qty 1

## 2019-09-08 MED ORDER — CLOPIDOGREL BISULFATE 75 MG PO TABS: 75.0000 mg | ORAL_TABLET | Freq: Every day | ORAL | Status: DC

## 2019-09-08 MED ORDER — IRRISEPT - 450ML BOTTLE WITH 0.05% CHG IN STERILE WATER, USP 99.95% OPTIME
TOPICAL | Status: DC | PRN
  Administered 2019-09-08: 450 mL

## 2019-09-08 MED ORDER — PHENYLEPHRINE 40 MCG/ML (10ML) SYRINGE FOR IV PUSH (FOR BLOOD PRESSURE SUPPORT)
PREFILLED_SYRINGE | INTRAVENOUS | Status: AC
  Filled 2019-09-08: qty 10

## 2019-09-08 MED ORDER — SUCCINYLCHOLINE CHLORIDE 200 MG/10ML IV SOSY
PREFILLED_SYRINGE | INTRAVENOUS | Status: AC
  Filled 2019-09-08: qty 10

## 2019-09-08 MED ORDER — FENTANYL CITRATE (PF) 100 MCG/2ML IJ SOLN
INTRAMUSCULAR | Status: AC
  Administered 2019-09-08: 25 ug via INTRAVENOUS
  Filled 2019-09-08: qty 2

## 2019-09-08 MED ORDER — GABAPENTIN 300 MG PO CAPS
300.0000 mg | ORAL_CAPSULE | Freq: Three times a day (TID) | ORAL | Status: DC
  Administered 2019-09-08 – 2019-09-10 (×5): 300 mg via ORAL
  Filled 2019-09-08 (×5): qty 1

## 2019-09-08 MED ORDER — ISOPROPYL ALCOHOL 70 % SOLN
Status: DC | PRN
  Administered 2019-09-08: 1 via TOPICAL

## 2019-09-08 MED ORDER — LIDOCAINE 2% (20 MG/ML) 5 ML SYRINGE
INTRAMUSCULAR | Status: DC | PRN
  Administered 2019-09-08: 50 mg via INTRAVENOUS

## 2019-09-08 MED ORDER — ONDANSETRON HCL 4 MG PO TABS: 4.0000 mg | ORAL_TABLET | Freq: Four times a day (QID) | ORAL | Status: DC | PRN

## 2019-09-08 MED ORDER — PROPOFOL 10 MG/ML IV BOLUS
INTRAVENOUS | Status: AC
  Filled 2019-09-08: qty 20

## 2019-09-08 MED ORDER — ONDANSETRON HCL 4 MG/2ML IJ SOLN
4.0000 mg | Freq: Four times a day (QID) | INTRAMUSCULAR | Status: DC | PRN
  Administered 2019-09-09: 4 mg via INTRAVENOUS
  Filled 2019-09-08: qty 2

## 2019-09-08 MED ORDER — CHLORHEXIDINE GLUCONATE 4 % EX LIQD: 60.0000 mL | Freq: Once | CUTANEOUS | Status: DC

## 2019-09-08 MED ORDER — LACTATED RINGERS IV SOLN: INTRAVENOUS | Status: DC

## 2019-09-08 MED ORDER — CEFAZOLIN SODIUM-DEXTROSE 2-4 GM/100ML-% IV SOLN
2.0000 g | Freq: Four times a day (QID) | INTRAVENOUS | Status: AC
  Administered 2019-09-08 – 2019-09-09 (×2): 2 g via INTRAVENOUS
  Filled 2019-09-08 (×2): qty 100

## 2019-09-08 MED ORDER — SUCCINYLCHOLINE CHLORIDE 20 MG/ML IJ SOLN
INTRAMUSCULAR | Status: DC | PRN
Start: 2019-09-08 — End: 2019-09-08
  Administered 2019-09-08: 100 mg via INTRAVENOUS

## 2019-09-08 MED ORDER — PROPOFOL 10 MG/ML IV BOLUS
INTRAVENOUS | Status: DC | PRN
  Administered 2019-09-08: 80 mg via INTRAVENOUS

## 2019-09-08 MED ORDER — FENTANYL CITRATE (PF) 100 MCG/2ML IJ SOLN: 25.0000 ug | INTRAMUSCULAR | Status: DC | PRN

## 2019-09-08 MED ORDER — METOCLOPRAMIDE HCL 5 MG PO TABS: 5.0000 mg | ORAL_TABLET | Freq: Three times a day (TID) | ORAL | Status: DC | PRN

## 2019-09-08 MED ORDER — DEXAMETHASONE SODIUM PHOSPHATE 10 MG/ML IJ SOLN
INTRAMUSCULAR | Status: AC
  Filled 2019-09-08: qty 1

## 2019-09-08 MED ORDER — BUPIVACAINE-EPINEPHRINE (PF) 0.5% -1:200000 IJ SOLN
INTRAMUSCULAR | Status: DC | PRN
  Administered 2019-09-08: 30 mL

## 2019-09-08 MED ORDER — ROCURONIUM BROMIDE 10 MG/ML (PF) SYRINGE
PREFILLED_SYRINGE | INTRAVENOUS | Status: DC | PRN
  Administered 2019-09-08: 50 mg via INTRAVENOUS
  Administered 2019-09-08: 10 mg via INTRAVENOUS
  Administered 2019-09-08: 20 mg via INTRAVENOUS

## 2019-09-08 MED ORDER — PHENOL 1.4 % MT LIQD: 1.0000 | OROMUCOSAL | Status: DC | PRN

## 2019-09-08 MED ORDER — 0.9 % SODIUM CHLORIDE (POUR BTL) OPTIME
TOPICAL | Status: DC | PRN
  Administered 2019-09-08: 1000 mL

## 2019-09-08 MED ORDER — ONDANSETRON HCL 4 MG/2ML IJ SOLN
INTRAMUSCULAR | Status: DC | PRN
  Administered 2019-09-08: 4 mg via INTRAVENOUS

## 2019-09-08 MED ORDER — PHENYLEPHRINE 40 MCG/ML (10ML) SYRINGE FOR IV PUSH (FOR BLOOD PRESSURE SUPPORT)
PREFILLED_SYRINGE | INTRAVENOUS | Status: DC | PRN
  Administered 2019-09-08: 120 ug via INTRAVENOUS

## 2019-09-08 MED ORDER — DEXAMETHASONE SODIUM PHOSPHATE 10 MG/ML IJ SOLN
INTRAMUSCULAR | Status: DC | PRN
  Administered 2019-09-08: 8 mg via INTRAVENOUS

## 2019-09-08 MED ORDER — SODIUM CHLORIDE 0.9 % IR SOLN
Status: DC | PRN
  Administered 2019-09-08: 500 mL

## 2019-09-08 MED ORDER — TAMSULOSIN HCL 0.4 MG PO CAPS
0.4000 mg | ORAL_CAPSULE | Freq: Every day | ORAL | Status: DC
  Administered 2019-09-08 – 2019-09-09 (×2): 0.4 mg via ORAL
  Filled 2019-09-08 (×2): qty 1

## 2019-09-08 MED ORDER — ONDANSETRON HCL 4 MG/2ML IJ SOLN
INTRAMUSCULAR | Status: AC
  Filled 2019-09-08: qty 2

## 2019-09-08 MED ORDER — POVIDONE-IODINE 10 % EX SWAB
2.0000 "application " | Freq: Once | CUTANEOUS | Status: AC
  Administered 2019-09-08: 2 via TOPICAL

## 2019-09-08 MED ORDER — SODIUM CHLORIDE (PF) 0.9 % IJ SOLN
INTRAMUSCULAR | Status: AC
  Filled 2019-09-08: qty 50

## 2019-09-08 MED ORDER — EPHEDRINE SULFATE-NACL 50-0.9 MG/10ML-% IV SOSY
PREFILLED_SYRINGE | INTRAVENOUS | Status: DC | PRN
  Administered 2019-09-08 (×2): 10 mg via INTRAVENOUS

## 2019-09-08 MED ORDER — TRANEXAMIC ACID-NACL 1000-0.7 MG/100ML-% IV SOLN
1000.0000 mg | INTRAVENOUS | Status: AC
  Administered 2019-09-08: 1000 mg via INTRAVENOUS
  Filled 2019-09-08: qty 100

## 2019-09-08 MED ORDER — METOCLOPRAMIDE HCL 5 MG/ML IJ SOLN: 5.0000 mg | Freq: Three times a day (TID) | INTRAMUSCULAR | Status: DC | PRN

## 2019-09-08 MED ORDER — SUGAMMADEX SODIUM 200 MG/2ML IV SOLN
INTRAVENOUS | Status: DC | PRN
  Administered 2019-09-08: 200 mg via INTRAVENOUS

## 2019-09-08 MED ORDER — CLOPIDOGREL BISULFATE 75 MG PO TABS
75.0000 mg | ORAL_TABLET | Freq: Every day | ORAL | Status: DC
  Administered 2019-09-09 – 2019-09-10 (×2): 75 mg via ORAL
  Filled 2019-09-08 (×3): qty 1

## 2019-09-08 MED ORDER — CEFAZOLIN SODIUM-DEXTROSE 2-4 GM/100ML-% IV SOLN
2.0000 g | INTRAVENOUS | Status: AC
  Administered 2019-09-08: 2 g via INTRAVENOUS
  Filled 2019-09-08: qty 100

## 2019-09-08 MED ORDER — POLYETHYLENE GLYCOL 3350 17 G PO PACK
17.0000 g | PACK | Freq: Every day | ORAL | Status: DC
  Administered 2019-09-09 – 2019-09-10 (×2): 17 g via ORAL
  Filled 2019-09-08 (×2): qty 1

## 2019-09-08 MED ORDER — ONDANSETRON HCL 4 MG/2ML IJ SOLN: 4.0000 mg | Freq: Once | INTRAMUSCULAR | Status: DC | PRN

## 2019-09-08 MED ORDER — DOCUSATE SODIUM 100 MG PO CAPS
100.0000 mg | ORAL_CAPSULE | Freq: Two times a day (BID) | ORAL | Status: DC
  Administered 2019-09-08 – 2019-09-10 (×4): 100 mg via ORAL
  Filled 2019-09-08 (×4): qty 1

## 2019-09-08 MED ORDER — ROCURONIUM BROMIDE 10 MG/ML (PF) SYRINGE
PREFILLED_SYRINGE | INTRAVENOUS | Status: AC
  Filled 2019-09-08: qty 10

## 2019-09-08 MED ORDER — STERILE WATER FOR IRRIGATION IR SOLN
Status: DC | PRN
  Administered 2019-09-08: 2000 mL

## 2019-09-08 MED ORDER — FUROSEMIDE 40 MG PO TABS
40.0000 mg | ORAL_TABLET | Freq: Every day | ORAL | Status: DC
  Administered 2019-09-09: 40 mg via ORAL
  Filled 2019-09-08: qty 1

## 2019-09-08 MED ORDER — BUPIVACAINE-EPINEPHRINE (PF) 0.25% -1:200000 IJ SOLN
INTRAMUSCULAR | Status: AC
  Filled 2019-09-08: qty 30

## 2019-09-08 MED ORDER — LIDOCAINE 2% (20 MG/ML) 5 ML SYRINGE
INTRAMUSCULAR | Status: AC
  Filled 2019-09-08: qty 5

## 2019-09-08 MED ORDER — EPHEDRINE 5 MG/ML INJ
INTRAVENOUS | Status: AC
  Filled 2019-09-08: qty 10

## 2019-09-08 MED ORDER — MENTHOL 3 MG MT LOZG: 1.0000 | LOZENGE | OROMUCOSAL | Status: DC | PRN

## 2019-09-08 SURGICAL SUPPLY — 69 items
ADH SKN CLS APL DERMABOND .7 (GAUZE/BANDAGES/DRESSINGS) ×2
APL PRP STRL LF DISP 70% ISPRP (MISCELLANEOUS) ×1
BLADE CLIPPER SURG (BLADE) ×2 IMPLANT
CHLORAPREP W/TINT 26 (MISCELLANEOUS) ×3 IMPLANT
COVER SURGICAL LIGHT HANDLE (MISCELLANEOUS) ×3 IMPLANT
COVER WAND RF STERILE (DRAPES) ×3 IMPLANT
DERMABOND ADVANCED (GAUZE/BANDAGES/DRESSINGS) ×4
DERMABOND ADVANCED .7 DNX12 (GAUZE/BANDAGES/DRESSINGS) ×2 IMPLANT
DRAPE C-ARM 42X120 X-RAY (DRAPES) ×3 IMPLANT
DRAPE IMP U-DRAPE 54X76 (DRAPES) ×6 IMPLANT
DRAPE SHEET LG 3/4 BI-LAMINATE (DRAPES) ×6 IMPLANT
DRAPE STERI IOBAN 125X83 (DRAPES) ×3 IMPLANT
DRAPE TOP 10253 STERILE (DRAPES) ×3 IMPLANT
DRAPE U-SHAPE 47X51 STRL (DRAPES) ×9 IMPLANT
DRESSING AQUACEL AG SP 3.5X10 (GAUZE/BANDAGES/DRESSINGS) IMPLANT
DRSG AQUACEL AG ADV 3.5X10 (GAUZE/BANDAGES/DRESSINGS) ×3 IMPLANT
DRSG AQUACEL AG SP 3.5X10 (GAUZE/BANDAGES/DRESSINGS) ×3
ELECT NDL TIP 2.8 STRL (NEEDLE) ×1 IMPLANT
ELECT NEEDLE TIP 2.8 STRL (NEEDLE) ×3 IMPLANT
ELECT REM PT RETURN 15FT ADLT (MISCELLANEOUS) ×3 IMPLANT
EVACUATOR 1/8 PVC DRAIN (DRAIN) IMPLANT
GAUZE SPONGE 4X4 12PLY STRL (GAUZE/BANDAGES/DRESSINGS) ×2 IMPLANT
GLOVE BIO SURGEON STRL SZ8.5 (GLOVE) ×8 IMPLANT
GLOVE BIOGEL PI IND STRL 8.5 (GLOVE) ×1 IMPLANT
GLOVE BIOGEL PI INDICATOR 8.5 (GLOVE) ×6
GOWN STRL REUS W/ TWL LRG LVL3 (GOWN DISPOSABLE) ×2 IMPLANT
GOWN STRL REUS W/TWL 2XL LVL3 (GOWN DISPOSABLE) ×3 IMPLANT
GOWN STRL REUS W/TWL LRG LVL3 (GOWN DISPOSABLE) ×6
HANDPIECE INTERPULSE COAX TIP (DISPOSABLE) ×3
HEAD FEM UNIPOLAR 54 OD STRL (Hips) ×2 IMPLANT
HOOD PEEL AWAY FLYTE STAYCOOL (MISCELLANEOUS) ×6 IMPLANT
KIT BASIN OR (CUSTOM PROCEDURE TRAY) ×3 IMPLANT
KIT TURNOVER KIT A (KITS) IMPLANT
MANIFOLD NEPTUNE II (INSTRUMENTS) ×3 IMPLANT
MARKER SKIN DUAL TIP RULER LAB (MISCELLANEOUS) ×3 IMPLANT
NDL SAFETY ECLIPSE 18X1.5 (NEEDLE) IMPLANT
NDL SPNL 18GX3.5 QUINCKE PK (NEEDLE) ×1 IMPLANT
NEEDLE HYPO 18GX1.5 SHARP (NEEDLE) ×3
NEEDLE SPNL 18GX3.5 QUINCKE PK (NEEDLE) ×3 IMPLANT
NS IRRIG 1000ML POUR BTL (IV SOLUTION) ×3 IMPLANT
PACK ANTERIOR HIP CUSTOM (KITS) ×3 IMPLANT
PACK TOTAL JOINT (CUSTOM PROCEDURE TRAY) ×3 IMPLANT
PENCIL SMOKE EVACUATOR (MISCELLANEOUS) ×2 IMPLANT
SAW OSC TIP CART 19.5X105X1.3 (SAW) ×3 IMPLANT
SEALER BIPOLAR AQUA 6.0 (INSTRUMENTS) ×2 IMPLANT
SET HNDPC FAN SPRY TIP SCT (DISPOSABLE) ×1 IMPLANT
SPACER DEPUY (Hips) ×2 IMPLANT
STEM TRI LOC GRIPTION SZ 5 STD IMPLANT
SUCTION FRAZIER HANDLE 10FR (MISCELLANEOUS) ×3
SUCTION TUBE FRAZIER 10FR DISP (MISCELLANEOUS) ×1 IMPLANT
SUT ETHIBOND NAB CT1 #1 30IN (SUTURE) ×6 IMPLANT
SUT MNCRL AB 3-0 PS2 18 (SUTURE) ×3 IMPLANT
SUT MNCRL AB 4-0 PS2 18 (SUTURE) ×2 IMPLANT
SUT MON AB 2-0 CT1 36 (SUTURE) ×5 IMPLANT
SUT STRATAFIX PDO 1 14 VIOLET (SUTURE) ×3
SUT STRATFX PDO 1 14 VIOLET (SUTURE) ×1
SUT VIC AB 1 CT1 27 (SUTURE) ×3
SUT VIC AB 1 CT1 27XBRD ANTBC (SUTURE) ×1 IMPLANT
SUT VIC AB 2-0 CT1 27 (SUTURE) ×3
SUT VIC AB 2-0 CT1 TAPERPNT 27 (SUTURE) ×1 IMPLANT
SUTURE STRATFX PDO 1 14 VIOLET (SUTURE) ×1 IMPLANT
SYR 3ML LL SCALE MARK (SYRINGE) ×2 IMPLANT
SYR 50ML LL SCALE MARK (SYRINGE) ×3 IMPLANT
SYR BULB IRRIG 60ML STRL (SYRINGE) ×2 IMPLANT
TOWEL OR 17X26 10 PK STRL BLUE (TOWEL DISPOSABLE) ×3 IMPLANT
TRAY FOLEY MTR SLVR 16FR STAT (SET/KITS/TRAYS/PACK) ×2 IMPLANT
TRI LOC GRIPTION SZ 5 STD ×3 IMPLANT
TUBE SUCTION HIGH CAP CLEAR NV (SUCTIONS) ×2 IMPLANT
WATER STERILE IRR 1000ML POUR (IV SOLUTION) ×9 IMPLANT

## 2019-09-08 NOTE — Anesthesia Preprocedure Evaluation (Addendum)
Anesthesia Evaluation    Reviewed: Allergy & Precautions, Patient's Chart, lab work & pertinent test results, Unable to perform ROS - Chart review only  Airway Mallampati: II  TM Distance: >3 FB Neck ROM: Full    Dental  (+) Dental Advisory Given   Pulmonary pneumonia,    Pulmonary exam normal breath sounds clear to auscultation       Cardiovascular hypertension, Pt. on medications Normal cardiovascular exam Rhythm:Regular Rate:Normal  Echo 12/2018 1. Left ventricular ejection fraction, by visual estimation, is 50%. The left ventricle has low normal to mildly reduced function. Left ventricular septal wall thickness was mildly increased. There is mildly increased left  ventricular hypertrophy.  2. Elevated left ventricular end-diastolic pressure.  3. Left ventricular diastolic parameters are consistent with Grade I diastolic dysfunction (impaired relaxation).  4. The left ventricle demonstrates global hypokinesis.  5. Global right ventricle has normal systolic function.The right ventricular size is normal. No increase in right ventricular wall thickness.  6. Left atrial size was normal.  7. Right atrial size was normal.  8. Mild mitral annular calcification.  9. The mitral valve is degenerative. Mild mitral valve regurgitation.  10. The tricuspid valve is grossly normal.  11. The aortic valve is tricuspid. Aortic valve regurgitation is mild. Mild aortic valve sclerosis without stenosis.  12. The pulmonic valve was grossly normal. Pulmonic valve regurgitation is not visualized.  13. The inferior vena cava is dilated in size with <50% respiratory variability, suggesting right atrial pressure of 15 mmHg.    Neuro/Psych PSYCHIATRIC DISORDERS Depression Dementia TIACVA    GI/Hepatic   Endo/Other  diabetes  Renal/GU Renal disease     Musculoskeletal  (+) Arthritis ,   Abdominal   Peds  Hematology  (+) Blood  dyscrasia, anemia ,   Anesthesia Other Findings   Reproductive/Obstetrics                             Anesthesia Physical  Anesthesia Plan  ASA: IV  Anesthesia Plan: General   Post-op Pain Management:    Induction: Intravenous, Rapid sequence and Cricoid pressure planned  PONV Risk Score and Plan: 2 and Ondansetron and Treatment may vary due to age or medical condition  Airway Management Planned: Oral ETT  Additional Equipment: None  Intra-op Plan:   Post-operative Plan: Possible Post-op intubation/ventilation  Informed Consent: I have reviewed the patients History and Physical, chart, labs and discussed the procedure including the risks, benefits and alternatives for the proposed anesthesia with the patient or authorized representative who has indicated his/her understanding and acceptance.     Dental advisory given and Consent reviewed with POA  Plan Discussed with: CRNA  Anesthesia Plan Comments:        Anesthesia Quick Evaluation

## 2019-09-08 NOTE — Progress Notes (Signed)
PROGRESS NOTE    Casey Collins  WUJ:[id number] DOB: 08-20-1935 DOA: 09/06/2019 PCP: Sande Brothers, MD   Brief Narrative: Casey Collins is a 84 y.o. malewith medical history significant ofhistory of stroke with residual right-sided weakness, slurred speech, hypertension, hyperlipidemia, type 2 diabetes mellitus, dementia, depression, GERD, arthritis CKD stage IIIb, anemia of chronic disease. Patient presented after having a fall and suffering a left femur fracture. Plan for surgery  Assessment & Plan:   Principal Problem:   Femur neck fracture (HCC) Active Problems:   H/O: CVA (cerebrovascular accident)   Diabetes mellitus type 2 in nonobese (HCC)   Benign essential HTN   CKD (chronic kidney disease) stage 3, GFR 30-59 ml/min   Sinus bradycardia   Hyperlipemia   Dementia (HCC)   Depression   Anemia due to chronic kidney disease   Fall at home, initial encounter   Femur fracture, left ([place])   Closed left hip fracture ([place])    #1 left femur fracture patient for surgery today.  Continue pain control with Dilaudid.  Add stool softeners.  #2 type 2 diabetes with diabetic neuropathy on Lantus 10 units twice a day prior to admission along with Novolin 5 units twice daily with meals. Continue sensitive SSI.  A1c was 7.1 from December 2020. On gabapentin.  #3 essential hypertension on Imdur amlodipine lisinopril as outpatient.  Holding lisinopril secondary to AKI.  Blood pressure 127/57 continue Imdur DC amlodipine  #4 hyperlipidemia on Lipitor  #5 BPH on Flomax  #6 history of Alzheimer's dementia on Aricept  #7 history of stroke on aspirin and Plavix which is on hold for surgery.  #8 chronic combined diastolic and systolic heart failure stable   #9 chronic sinus bradycardia is asymptomatic.   Estimated body mass index is 25.21 kg/m as calculated from the following:   Height as of 05/06/19: 5\' 10"  (1.778 m).   Weight as of this encounter: 79.7 kg.  DVT  prophylaxis: SCD  code Status: Full code Family Communication: None at bedside  disposition Plan:  Status is: Inpatient  Dispo: The patient is from: snf              Anticipated d/c is to:snf              Anticipated d/c date is: Unknown              Patient currently is not medically stable to d/c.    Consultants:   Ortho  Procedures: None Antimicrobials: None  Subjective: Patient resting in bed he is not in any distress however he tells me that he is not too well and that he is in pain  Objective: Vitals:   09/08/19 0500 09/08/19 0515 09/08/19 0913 09/08/19 1337  BP:  (!) 149/68 130/74 (!) 127/57  Pulse:  64 63 (!) 54  Resp:  16 14 17   Temp:  98 F (36.7 C) 98.5 F (36.9 C) 98.6 F (37 C)  TempSrc:  Oral Oral Oral  SpO2:  92% 94% 94%  Weight: 79.7 kg       Intake/Output Summary (Last 24 hours) at 09/08/2019 1456 Last data filed at 09/08/2019 0600 Gross per 24 hour  Intake 1110 ml  Output 625 ml  Net 485 ml   Filed Weights   09/08/19 0500  Weight: 79.7 kg    Examination:  General exam: Appears calm and comfortable  Respiratory system: Clear to auscultation. Respiratory effort normal. Cardiovascular system: S1 & S2 heard, RRR. No JVD, murmurs, rubs,  gallops or clicks. No pedal edema. Gastrointestinal system: Abdomen is nondistended, soft and nontender. No organomegaly or masses felt. Normal bowel sounds heard. Central nervous system: Alert and oriented. No focal neurological deficits. Extremities: Symmetric 5 x 5 power. Skin: No rashes, lesions or ulcers Psychiatry: Judgement and insight appear normal. Mood & affect appropriate.     Data Reviewed: I have personally reviewed following labs and imaging studies  CBC: Recent Labs  Lab 09/06/19 1315 09/06/19 1550 09/07/19 0523 09/08/19 0300  WBC 10.3 11.2* 9.7 9.6  NEUTROABS 7.8*  --   --   --   HGB 11.2* 11.5* 12.2* 11.4*  HCT 36.6* 37.2* 39.2 35.8*  MCV 95.1 94.4 94.2 95.2  PLT 194 210 199 035    Basic Metabolic Panel: Recent Labs  Lab 09/06/19 1315 09/06/19 1550 09/07/19 0523 09/08/19 0300  NA 139  --  141 138  K 4.3  --  4.0 4.2  CL 103  --  101 104  CO2 28  --  28 25  GLUCOSE 87  --  121* 154*  BUN 38*  --  32* 29*  CREATININE 1.99* 1.98* 1.57* 1.39*  CALCIUM 9.0  --  9.1 8.5*  MG  --  1.9  --   --   PHOS  --  3.8  --   --    GFR: Estimated Creatinine Clearance: 42.3 mL/min (A) (by C-G formula based on SCr of 1.39 mg/dL (H)). Liver Function Tests: Recent Labs  Lab 09/07/19 0523  AST 19  ALT 16  ALKPHOS 89  BILITOT 0.8  PROT 6.5  ALBUMIN 3.6   No results for input(s): LIPASE, AMYLASE in the last 168 hours. No results for input(s): AMMONIA in the last 168 hours. Coagulation Profile: Recent Labs  Lab 09/06/19 1315  INR 1.2   Cardiac Enzymes: No results for input(s): CKTOTAL, CKMB, CKMBINDEX, TROPONINI in the last 168 hours. BNP (last 3 results) No results for input(s): PROBNP in the last 8760 hours. HbA1C: Recent Labs    09/07/19 1611  HGBA1C 6.9*   CBG: Recent Labs  Lab 09/07/19 1610 09/07/19 2133 09/08/19 0713 09/08/19 1145 09/08/19 1414  GLUCAP 145* 102* 137* 101* 101*   Lipid Profile: No results for input(s): CHOL, HDL, LDLCALC, TRIG, CHOLHDL, LDLDIRECT in the last 72 hours. Thyroid Function Tests: Recent Labs    09/06/19 1550  TSH 4.099   Anemia Panel: No results for input(s): VITAMINB12, FOLATE, FERRITIN, TIBC, IRON, RETICCTPCT in the last 72 hours. Sepsis Labs: No results for input(s): PROCALCITON, LATICACIDVEN in the last 168 hours.  Recent Results (from the past 240 hour(s))  SARS Coronavirus 2 by RT PCR (hospital order, performed in [facility] hospital lab) Nasopharyngeal Nasopharyngeal Swab     Status: None   Collection Time: 09/06/19  1:15 PM   Specimen: Nasopharyngeal Swab  Result Value Ref Range Status   SARS Coronavirus 2 NEGATIVE NEGATIVE Final    Comment: (NOTE) SARS-CoV-2 target nucleic acids are NOT  DETECTED.  The SARS-CoV-2 RNA is generally detectable in upper and lower respiratory specimens during the acute phase of infection. The lowest concentration of SARS-CoV-2 viral copies this assay can detect is 250 copies / mL. A negative result does not preclude SARS-CoV-2 infection and should not be used as the sole basis for treatment or other patient management decisions.  A negative result may occur with improper specimen collection / handling, submission of specimen other than nasopharyngeal swab, presence of viral mutation(s) within the areas targeted by this  assay, and inadequate number of viral copies (<250 copies / mL). A negative result must be combined with clinical observations, patient history, and epidemiological information.  Fact Sheet for Patients:   StrictlyIdeas.no  Fact Sheet for Healthcare Providers: BankingDealers.co.za  This test is not yet approved or  cleared by the [place] FDA and has been authorized for detection and/or diagnosis of SARS-CoV-2 by FDA under an Emergency Use Authorization (EUA).  This EUA will remain in effect (meaning this test can be used) for the duration of the COVID-19 declaration under Section 564(b)(1) of the Act, 21 U.S.C. section 360bbb-3(b)(1), unless the authorization is terminated or revoked sooner.  Performed at [facility], [place] [address] [place], [place] [address]   Surgical PCR screen     Status: None   Collection Time: 09/07/19  9:07 PM   Specimen: Nasal Mucosa; Nasal Swab  Result Value Ref Range Status   MRSA, PCR NEGATIVE NEGATIVE Final   Staphylococcus aureus NEGATIVE NEGATIVE Final    Comment: (NOTE) The Xpert SA Assay (FDA approved for NASAL specimens in patients 49 years of age and older), is one component of a comprehensive surveillance program. It is not intended to diagnose infection nor to guide or monitor treatment. Performed at [facility], [place] [address] [place], [place] [address]          Radiology Studies: CT HIP LEFT WO CONTRAST  Result Date: 09/07/2019 CLINICAL DATA:  Evaluate left hip fracture EXAM: CT OF THE LEFT HIP WITHOUT CONTRAST TECHNIQUE: Multidetector CT imaging of the left hip was performed according to the standard protocol. Multiplanar CT image reconstructions were also generated. COMPARISON:  X-ray 09/06/2019 FINDINGS: Bones/Joint/Cartilage Acute transcervical fracture of the left femoral neck with mild impaction upon the lateral fracture margin (series 14, image 46). No significant displacement or angulation. Fracture line does not appear to involve the femoral head articular surface or the intertrochanteric region. Left hip joint is intact without dislocation. Mild to moderate left hip arthropathy. Small to moderate hip joint effusion. Visualized portion of the left hemipelvis is intact. Ligaments Suboptimally assessed by CT. Muscles and Tendons No acute musculotendinous abnormality by CT. Soft tissues No soft tissue fluid collection or hematoma. Vascular calcifications. IMPRESSION: 1. Acute mildly impacted transcervical fracture of the left femoral neck. 2. Small to moderate left hip joint effusion. 3. Mild to moderate left hip arthropathy. Electronically Signed   By: Davina Poke D.O.   On: 09/07/2019 10:33   DG Knee Left Port  Result Date: 09/07/2019 CLINICAL DATA:  Left knee pain. EXAM: PORTABLE LEFT KNEE - 1-2 VIEW COMPARISON:  None. FINDINGS: Minimal degenerative changes for age. No acute fracture, osteochondral lesion or chondrocalcinosis. No joint effusion. Vascular calcifications are noted. IMPRESSION: Minimal degenerative changes for age. No acute bony findings or joint effusion. Electronically Signed   By: Marijo Sanes M.D.   On: 09/07/2019 09:33        Scheduled Meds:  [MAR Hold] amLODipine  5 mg Oral Daily   chlorhexidine  60 mL Topical Once   [MAR Hold] citalopram   10 mg Oral Daily   [MAR Hold] donepezil  5 mg Oral QHS   [MAR Hold] enoxaparin (LOVENOX) injection  40 mg Subcutaneous Q24H   [MAR Hold] insulin aspart  0-9 Units Subcutaneous TID WC   [MAR Hold] isosorbide mononitrate  30 mg Oral Daily   [MAR Hold] tamsulosin  0.4 mg Oral QPC supper   Continuous Infusions:  sodium chloride 75 mL/hr  at 09/08/19 0515    ceFAZolin (ANCEF) IV     lactated ringers 10 mL/hr at 09/08/19 1350   tranexamic acid       LOS: 2 days     Georgette Shell, MD 09/08/2019, 2:56 PM

## 2019-09-08 NOTE — Anesthesia Procedure Notes (Signed)
Date/Time: 09/08/2019 5:32 PM Performed by: Cynda Familia, CRNA Oxygen Delivery Method: Simple face mask Placement Confirmation: positive ETCO2 and breath sounds checked- equal and bilateral Dental Injury: Teeth and Oropharynx as per pre-operative assessment

## 2019-09-08 NOTE — Transfer of Care (Signed)
Immediate Anesthesia Transfer of Care Note  Patient: Casey Collins  Procedure(s) Performed: LEFT ANTERIOR APPROACH HEMI HIP ARTHROPLASTY (Left )  Patient Location: PACU  Anesthesia Type:General  Level of Consciousness: alert   Airway & Oxygen Therapy: Patient Spontanous Breathing and Patient connected to face mask oxygen  Post-op Assessment: Report given to RN and Post -op Vital signs reviewed and stable  Post vital signs: Reviewed and stable  Last Vitals:  Vitals Value Taken Time  BP    Temp    Pulse 96 09/08/19 1741  Resp 21 09/08/19 1741  SpO2 99 % 09/08/19 1741  Vitals shown include unvalidated device data.  Last Pain:  Vitals:   09/08/19 1357  TempSrc:   PainSc: Asleep      Patients Stated Pain Goal: 3 (03/83/33 8329)  Complications: No complications documented.

## 2019-09-08 NOTE — Discharge Instructions (Signed)
°Dr. Marshal Schrecengost °Joint Replacement Specialist °[facility] °[address] °[place], [place] [address] °[phone] ° ° °TOTAL HIP REPLACEMENT POSTOPERATIVE DIRECTIONS ° ° ° °Hip Rehabilitation, Guidelines Following Surgery  ° °WEIGHT BEARING °Weight bearing as tolerated with assist device (walker, cane, etc) as directed, use it as long as suggested by your surgeon or therapist, typically at least 4-6 weeks. ° °The results of a hip operation are greatly improved after range of motion and muscle strengthening exercises. Follow all safety measures which are given to protect your hip. If any of these exercises cause increased pain or swelling in your joint, decrease the amount until you are comfortable again. Then slowly increase the exercises. Call your caregiver if you have problems or questions.  ° °HOME CARE INSTRUCTIONS  °Most of the following instructions are designed to prevent the dislocation of your new hip.  °Remove items at home which could result in a fall. This includes throw rugs or furniture in walking pathways.  °Continue medications as instructed at time of discharge. °· You may have some home medications which will be placed on hold until you complete the course of blood thinner medication. °· You may start showering once you are discharged home. Do not remove your dressing. °Do not put on socks or shoes without following the instructions of your caregivers.   °Sit on chairs with arms. Use the chair arms to help push yourself up when arising.  °Arrange for the use of a toilet seat elevator so you are not sitting low.  °· Walk with walker as instructed.  °You may resume a sexual relationship in one month or when given the OK by your caregiver.  °Use walker as long as suggested by your caregivers.  °You may put full weight on your legs and walk as much as is comfortable. °Avoid periods of inactivity such as sitting longer than an hour when not asleep. This helps prevent  blood clots.  °You may return to work once you are cleared by your surgeon.  °Do not drive a car for 6 weeks or until released by your surgeon.  °Do not drive while taking narcotics.  °Wear elastic stockings for two weeks following surgery during the day but you may remove then at night.  °Make sure you keep all of your appointments after your operation with all of your doctors and caregivers. You should call the office at the above phone number and make an appointment for approximately two weeks after the date of your surgery. °Please pick up a stool softener and laxative for home use as long as you are requiring pain medications. °· ICE to the affected hip every three hours for 30 minutes at a time and then as needed for pain and swelling. Continue to use ice on the hip for pain and swelling from surgery. You may notice swelling that will progress down to the foot and ankle.  This is normal after surgery.  Elevate the leg when you are not up walking on it.   °It is important for you to complete the blood thinner medication as prescribed by your doctor. °· Continue to use the breathing machine which will help keep your temperature down.  It is common for your temperature to cycle up and down following surgery, especially at night when you are not up moving around and exerting yourself.  The breathing machine keeps your lungs expanded and your temperature down. ° °RANGE OF MOTION AND STRENGTHENING EXERCISES  °These exercises are   designed to help you keep full movement of your hip joint. Follow your caregiver's or physical therapist's instructions. Perform all exercises about fifteen times, three times per day or as directed. Exercise both hips, even if you have had only one joint replacement. These exercises can be done on a training (exercise) mat, on the floor, on a table or on a bed. Use whatever works the best and is most comfortable for you. Use music or television while you are exercising so that the exercises  are a pleasant break in your day. This will make your life better with the exercises acting as a break in routine you can look forward to.  °Lying on your back, slowly slide your foot toward your buttocks, raising your knee up off the floor. Then slowly slide your foot back down until your leg is straight again.  °Lying on your back spread your legs as far apart as you can without causing discomfort.  °Lying on your side, raise your upper leg and foot straight up from the floor as far as is comfortable. Slowly lower the leg and repeat.  °Lying on your back, tighten up the muscle in the front of your thigh (quadriceps muscles). You can do this by keeping your leg straight and trying to raise your heel off the floor. This helps strengthen the largest muscle supporting your knee.  °Lying on your back, tighten up the muscles of your buttocks both with the legs straight and with the knee bent at a comfortable angle while keeping your heel on the floor.  ° °SKILLED REHAB INSTRUCTIONS: °If the patient is transferred to a skilled rehab facility following release from the hospital, a list of the current medications will be sent to the facility for the patient to continue.  When discharged from the skilled rehab facility, please have the facility set up the patient's Home Health Physical Therapy prior to being released. Also, the skilled facility will be responsible for providing the patient with their medications at time of release from the facility to include their pain medication and their blood thinner medication. If the patient is still at the rehab facility at time of the two week follow up appointment, the skilled rehab facility will also need to assist the patient in arranging follow up appointment in our office and any transportation needs. ° °MAKE SURE YOU:  °Understand these instructions.  °Will watch your condition.  °Will get help right away if you are not doing well or get worse. ° °Pick up stool softner and  laxative for home use following surgery while on pain medications. °Do not remove your dressing. °The dressing is waterproof--it is OK to take showers. °Continue to use ice for pain and swelling after surgery. °Do not use any lotions or creams on the incision until instructed by your surgeon. °Total Hip Protocol. ° ° °
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
## 2019-09-08 NOTE — Op Note (Signed)
OPERATIVE REPORT  SURGEON: Rod Can, MD   ASSISTANT: Cherlynn June, PA-C  PREOPERATIVE DIAGNOSIS: Displaced Left femoral neck fracture.   POSTOPERATIVE DIAGNOSIS: Displaced Left femoral neck fracture.   PROCEDURE: Left hip hemiarthroplasty, anterior approach.   IMPLANTS: DePuy Tri Lock stem, size 5, std offset, with a -3 mm spacer and a 54 mm monopolar head ball.  ANESTHESIA:  General  ANTIBIOTICS: 2g ancef.  ESTIMATED BLOOD LOSS:-300 mL    DRAINS: None.  COMPLICATIONS: None   CONDITION: PACU - hemodynamically stable.   BRIEF CLINICAL NOTE: Casey Collins is a 84 y.o. male with a displaced Left femoral neck fracture. The patient was admitted to the hospitalist service and underwent perioperative risk stratification and medical optimization. The risks, benefits, and alternatives to hemiarthroplasty were explained, and the patient elected to proceed.  PROCEDURE IN DETAIL: The patient was taken to the operating room and general anesthesia was induced on the hospital bed.  The patient was then positioned on the Hana table.  All bony prominences were well padded.  The hip was prepped and draped in the normal sterile surgical fashion.  A time-out was called verifying side and site of surgery. Antibiotics were given within 60 minutes of beginning the procedure.   The direct anterior approach to the hip was performed through the Hueter interval.  Lateral femoral circumflex vessels were treated with the Auqumantys. The anterior capsule was exposed and an inverted T capsulotomy was made.  Fracture hematoma was encountered and evacuated. The patient was found to have a comminuted Left subcapital femoral neck fracture.  I freshened the femoral neck cut with a saw.  I removed the femoral neck fragment.  A corkscrew was placed into the head and the head was removed.  This was passed to the back table and was measured.   Acetabular exposure was achieved.  I examined the  articular cartilage which was intact.  The labrum was intact. A 54 mm trial head was placed and found to have excellent fit.   I then gained femoral exposure taking care to protect the abductors and greater trochanter.  This was performed using standard external rotation, extension, and adduction.  The capsule was peeled off the inner aspect of the greater trochanter, taking care to preserve the short external rotators. A cookie cutter was used to enter the femoral canal, and then the femoral canal finder was used to confirm location.  I then sequentially broached up to a size 5.  Calcar planer was used on the femoral neck remnant.  I paced a std neck and a 36 + 1.5 head ball. The hip was reduced.  Leg lengths were checked fluoroscopically.  The hip was dislocated and trial components were removed.  I placed the real stem followed by the real spacer and head ball.  A single reduction maneuver was performed and the hip was reduced.  Fluoroscopy was used to confirm component position and leg lengths.  At 90 degrees of external rotation and extension, the hip was stable to an anterior directed force.   The wound was copiously irrigated with Irrisept solution and normal saline using pule lavage.  Marcaine solution was injected into the periarticular soft tissue.  The wound was closed in layers using #1 Vicryl and V-Loc for the fascia, 2-0 Vicryl for the subcutaneous fat, 2-0 Monocryl for the deep dermal layer, 3-0 running Monocryl subcuticular stitch and glue for the skin.  Once the glue was fully dried, an Aquacell Ag dressing was applied.  The  patient was then awakened from anesthesia and transported to the recovery room in stable condition.  Sponge, needle, and instrument counts were correct at the end of the case x2.  The patient tolerated the procedure well and there were no known complications.  Please note that a surgical assistant was a medical necessity for this procedure to perform it in a safe and  expeditious manner. Assistant was necessary to provide appropriate retraction of vital neurovascular structures, to prevent femoral fracture, and to allow for anatomic placement of the prosthesis.  POSTOPERATIVE PLAN: The patient be readmitted to the hospitalist service.  Weightbearing as tolerated left lower extremity with walker.  Beginning tomorrow morning, resume Plavix.  Add aspirin 81 mg p.o. twice daily for 6 weeks for DVT prophylaxis.  Mobilize out of bed with PT/OT.  He will undergo disposition planning.  Return to the office 2 weeks after discharge for routine postop care.

## 2019-09-08 NOTE — Anesthesia Procedure Notes (Signed)
Procedure Name: Intubation Date/Time: 09/08/2019 3:05 PM Performed by: Niel Hummer, CRNA Pre-anesthesia Checklist: Patient identified, Emergency Drugs available, Suction available and Patient being monitored Patient Re-evaluated:Patient Re-evaluated prior to induction Oxygen Delivery Method: Circle system utilized Preoxygenation: Pre-oxygenation with 100% oxygen Induction Type: Rapid sequence and IV induction Laryngoscope Size: Mac and 4 Grade View: Grade I Tube type: Oral Tube size: 7.5 mm Number of attempts: 1 Airway Equipment and Method: Stylet Placement Confirmation: ETT inserted through vocal cords under direct vision,  positive ETCO2 and breath sounds checked- equal and bilateral Secured at: 23 cm Tube secured with: Tape Dental Injury: Teeth and Oropharynx as per pre-operative assessment

## 2019-09-09 ENCOUNTER — Encounter (HOSPITAL_COMMUNITY): Payer: Self-pay | Admitting: Orthopedic Surgery

## 2019-09-09 DIAGNOSIS — S72002A Fracture of unspecified part of neck of left femur, initial encounter for closed fracture: Secondary | ICD-10-CM | POA: Diagnosis not present

## 2019-09-09 LAB — CBC
HCT: 28.4 % — ABNORMAL LOW (ref 39.0–52.0)
Hemoglobin: 8.9 g/dL — ABNORMAL LOW (ref 13.0–17.0)
MCH: 30.7 pg (ref 26.0–34.0)
MCHC: 31.3 g/dL (ref 30.0–36.0)
MCV: 97.9 fL (ref 80.0–100.0)
Platelets: 149 10*3/uL — ABNORMAL LOW (ref 150–400)
RBC: 2.9 MIL/uL — ABNORMAL LOW (ref 4.22–5.81)
RDW: 14.2 % (ref 11.5–15.5)
WBC: 10.2 10*3/uL (ref 4.0–10.5)
nRBC: 0 % (ref 0.0–0.2)

## 2019-09-09 LAB — GLUCOSE, CAPILLARY
Glucose-Capillary: 253 mg/dL — ABNORMAL HIGH (ref 70–99)
Glucose-Capillary: 279 mg/dL — ABNORMAL HIGH (ref 70–99)
Glucose-Capillary: 176 mg/dL — ABNORMAL HIGH (ref 70–99)
Glucose-Capillary: 166 mg/dL — ABNORMAL HIGH (ref 70–99)

## 2019-09-09 LAB — COMPREHENSIVE METABOLIC PANEL
ALT: 12 U/L (ref 0–44)
AST: 21 U/L (ref 15–41)
Albumin: 2.8 g/dL — ABNORMAL LOW (ref 3.5–5.0)
Alkaline Phosphatase: 67 U/L (ref 38–126)
Anion gap: 9 (ref 5–15)
BUN: 29 mg/dL — ABNORMAL HIGH (ref 8–23)
CO2: 23 mmol/L (ref 22–32)
Calcium: 7.8 mg/dL — ABNORMAL LOW (ref 8.9–10.3)
Chloride: 103 mmol/L (ref 98–111)
Creatinine, Ser: 1.7 mg/dL — ABNORMAL HIGH (ref 0.61–1.24)
GFR calc Af Amer: 43 mL/min — ABNORMAL LOW (ref >60)
GFR calc non Af Amer: 37 mL/min — ABNORMAL LOW (ref >60)
Glucose, Bld: 337 mg/dL — ABNORMAL HIGH (ref 70–99)
Potassium: 4.9 mmol/L (ref 3.5–5.1)
Sodium: 135 mmol/L (ref 135–145)
Total Bilirubin: 0.5 mg/dL (ref 0.3–1.2)
Total Protein: 5.2 g/dL — ABNORMAL LOW (ref 6.5–8.1)

## 2019-09-09 LAB — SARS CORONAVIRUS 2 BY RT PCR (HOSPITAL ORDER, PERFORMED IN ~~LOC~~ HOSPITAL LAB): SARS Coronavirus 2: NEGATIVE

## 2019-09-09 MED ORDER — OXYCODONE HCL 5 MG PO TABS: 5.0000 mg | ORAL_TABLET | ORAL | 0 refills | Status: AC | PRN

## 2019-09-09 MED ORDER — SODIUM CHLORIDE 0.9 % IV SOLN: INTRAVENOUS | Status: DC

## 2019-09-09 MED ORDER — INSULIN GLARGINE 100 UNIT/ML ~~LOC~~ SOLN
5.0000 [IU] | Freq: Two times a day (BID) | SUBCUTANEOUS | Status: DC
  Administered 2019-09-09 – 2019-09-10 (×3): 5 [IU] via SUBCUTANEOUS
  Filled 2019-09-09 (×3): qty 0.05

## 2019-09-09 MED ORDER — HALOPERIDOL LACTATE 5 MG/ML IJ SOLN
5.0000 mg | Freq: Once | INTRAMUSCULAR | Status: AC
  Administered 2019-09-09: 5 mg via INTRAVENOUS
  Filled 2019-09-09: qty 1

## 2019-09-09 MED ORDER — ASPIRIN 81 MG PO CHEW: 81.0000 mg | CHEWABLE_TABLET | Freq: Two times a day (BID) | ORAL | 0 refills | Status: AC

## 2019-09-09 NOTE — Progress Notes (Addendum)
PROGRESS NOTE    Casey Collins  VEL:[id number] DOB: May 15, 1935 DOA: 09/06/2019 PCP: Sande Brothers, MD   Brief Narrative: Casey Collins is a 84 y.o. malewith medical history significant ofhistory of stroke with residual right-sided weakness, slurred speech, hypertension, hyperlipidemia, type 2 diabetes mellitus, dementia, depression, GERD, arthritis CKD stage IIIb, anemia of chronic disease. Patient presented after having a fall and suffering a left femur fracture. Plan for surgery  Assessment & Plan:   Principal Problem:   Femur neck fracture (HCC) Active Problems:   H/O: CVA (cerebrovascular accident)   Diabetes mellitus type 2 in nonobese (HCC)   Benign essential HTN   CKD (chronic kidney disease) stage 3, GFR 30-59 ml/min   Sinus bradycardia   Hyperlipemia   Dementia (HCC)   Depression   Anemia due to chronic kidney disease   Fall at home, initial encounter   Femur fracture, left ([place])   Closed left hip fracture ([place])    #1 left femur fracture-s/p left hemi arthroplasty 8/11  WBAT per ortho Pain control DVT prophylaxis Bowel regime Will order Covid today for SNF tomorrow if he remains stable PT eval pending.  #2 type 2 diabetes with diabetic neuropathy on Lantus 10 units twice a day prior to admission along with Novolin 5 units twice daily with meals. Continue sensitive SSI.  A1c was 7.1 from December 2020. On gabapentin.  Restart Lantus. CBG (last 3)  Recent Labs    09/08/19 1803 09/08/19 2118 09/09/19 0716  GLUCAP 192* 289* 253*     #3 essential hypertension blood pressure still remains soft 99/50.  Is on Imdur lisinopril and amlodipine at home.  Hold Imdur in addition to holding lisinopril and amlodipine.  #4 hyperlipidemia on Lipitor  #5 BPH on Flomax  #6 history of Alzheimer's dementia on Aricept  #7 history of stroke on aspirin and Plavix  #8 chronic combined diastolic and systolic heart failure stable   #9 chronic sinus  bradycardia is asymptomatic.  #10 Abla-hemoglobin 8.9 down from 10.2.  Monitor closely.  #11 AKI creatinine 1.7 from 1.6 patient appears dry with soft blood pressure hold Lasix start normal saline 75 cc an hour.   Estimated body mass index is 25.21 kg/m as calculated from the following:   Height as of 05/06/19: 5\' 10"  (1.778 m).   Weight as of this encounter: 79.7 kg.  DVT prophylaxis: SCD /on aspirin per Ortho code Status: Full code Family Communication: None at bedside  disposition Plan:  Status is: Inpatient  Dispo: The patient is from: snf              Anticipated d/c is to:snf              Anticipated d/c date is: Unknown              Patient currently is not medically stable to d/c.    Consultants:   Ortho  Procedures: None Antimicrobials: None  Subjective: Patient resting in bed complaining that he feels lousy  Objective: Vitals:   09/08/19 2021 09/08/19 2117 09/09/19 0110 09/09/19 0500  BP: 107/71 130/74 (!) 111/59 (!) 99/50  Pulse: 91 97 82 68  Resp: 18 16 16 15   Temp: 97.8 F (36.6 C) 97.9 F (36.6 C) 98 F (36.7 C) (!) 97.5 F (36.4 C)  TempSrc: Oral Oral Oral Oral  SpO2: 96% 95% 92% 96%  Weight:        Intake/Output Summary (Last 24 hours) at 09/09/2019 1059 Last data filed at 09/09/2019  0900 Gross per 24 hour  Intake 3361.51 ml  Output 1175 ml  Net 2186.51 ml   Filed Weights   09/08/19 0500  Weight: 79.7 kg    Examination:  General exam: Appears calm and comfortable  Respiratory system: Clear to auscultation. Respiratory effort normal. Cardiovascular system: S1 & S2 heard, RRR. No JVD, murmurs, rubs, gallops or clicks. No pedal edema. Gastrointestinal system: Abdomen is nondistended, soft and nontender. No organomegaly or masses felt. Normal bowel sounds heard. Central nervous system: Alert and oriented. No focal neurological deficits. Extremities: No edema left hip incision covered with dressings. Skin: No rashes, lesions or  ulcers Psychiatry: Judgement and insight appear normal. Mood & affect appropriate.     Data Reviewed: I have personally reviewed following labs and imaging studies  CBC: Recent Labs  Lab 09/06/19 1315 09/06/19 1315 09/06/19 1550 09/07/19 0523 09/08/19 0300 09/08/19 1713 09/09/19 0314  WBC 10.3  --  11.2* 9.7 9.6  --  10.2  NEUTROABS 7.8*  --   --   --   --   --   --   HGB 11.2*   < > 11.5* 12.2* 11.4* 10.2* 8.9*  HCT 36.6*   < > 37.2* 39.2 35.8* 30.0* 28.4*  MCV 95.1  --  94.4 94.2 95.2  --  97.9  PLT 194  --  210 199 176  --  149*   < > = values in this interval not displayed.   Basic Metabolic Panel: Recent Labs  Lab 09/06/19 1315 09/06/19 1315 09/06/19 1550 09/07/19 0523 09/08/19 0300 09/08/19 1713 09/09/19 0314  NA 139  --   --  141 138 141 135  K 4.3  --   --  4.0 4.2 4.4 4.9  CL 103  --   --  101 104 103 103  CO2 28  --   --  28 25  --  23  GLUCOSE 87  --   --  121* 154* 146* 337*  BUN 38*  --   --  32* 29* 24* 29*  CREATININE 1.99*   < > 1.98* 1.57* 1.39* 1.60* 1.70*  CALCIUM 9.0  --   --  9.1 8.5*  --  7.8*  MG  --   --  1.9  --   --   --   --   PHOS  --   --  3.8  --   --   --   --    < > = values in this interval not displayed.   GFR: Estimated Creatinine Clearance: 34.6 mL/min (A) (by C-G formula based on SCr of 1.7 mg/dL (H)). Liver Function Tests: Recent Labs  Lab 09/07/19 0523 09/09/19 0314  AST 19 21  ALT 16 12  ALKPHOS 89 67  BILITOT 0.8 0.5  PROT 6.5 5.2*  ALBUMIN 3.6 2.8*   No results for input(s): LIPASE, AMYLASE in the last 168 hours. No results for input(s): AMMONIA in the last 168 hours. Coagulation Profile: Recent Labs  Lab 09/06/19 1315  INR 1.2   Cardiac Enzymes: No results for input(s): CKTOTAL, CKMB, CKMBINDEX, TROPONINI in the last 168 hours. BNP (last 3 results) No results for input(s): PROBNP in the last 8760 hours. HbA1C: Recent Labs    09/07/19 1611  HGBA1C 6.9*   CBG: Recent Labs  Lab 09/08/19 1145  09/08/19 1414 09/08/19 1803 09/08/19 2118 09/09/19 0716  GLUCAP 101* 101* 192* 289* 253*   Lipid Profile: No results for input(s): CHOL, HDL, LDLCALC, TRIG, CHOLHDL,  LDLDIRECT in the last 72 hours. Thyroid Function Tests: Recent Labs    09/06/19 1550  TSH 4.099   Anemia Panel: No results for input(s): VITAMINB12, FOLATE, FERRITIN, TIBC, IRON, RETICCTPCT in the last 72 hours. Sepsis Labs: No results for input(s): PROCALCITON, LATICACIDVEN in the last 168 hours.  Recent Results (from the past 240 hour(s))  SARS Coronavirus 2 by RT PCR (hospital order, performed in [facility] hospital lab) Nasopharyngeal Nasopharyngeal Swab     Status: None   Collection Time: 09/06/19  1:15 PM   Specimen: Nasopharyngeal Swab  Result Value Ref Range Status   SARS Coronavirus 2 NEGATIVE NEGATIVE Final    Comment: (NOTE) SARS-CoV-2 target nucleic acids are NOT DETECTED.  The SARS-CoV-2 RNA is generally detectable in upper and lower respiratory specimens during the acute phase of infection. The lowest concentration of SARS-CoV-2 viral copies this assay can detect is 250 copies / mL. A negative result does not preclude SARS-CoV-2 infection and should not be used as the sole basis for treatment or other patient management decisions.  A negative result may occur with improper specimen collection / handling, submission of specimen other than nasopharyngeal swab, presence of viral mutation(s) within the areas targeted by this assay, and inadequate number of viral copies (<250 copies / mL). A negative result must be combined with clinical observations, patient history, and epidemiological information.  Fact Sheet for Patients:   StrictlyIdeas.no  Fact Sheet for Healthcare Providers: BankingDealers.co.za  This test is not yet approved or  cleared by the [place] FDA and has been authorized for detection and/or diagnosis of SARS-CoV-2 by FDA under  an Emergency Use Authorization (EUA).  This EUA will remain in effect (meaning this test can be used) for the duration of the COVID-19 declaration under Section 564(b)(1) of the Act, 21 U.S.C. section 360bbb-3(b)(1), unless the authorization is terminated or revoked sooner.  Performed at [facility], [place] [address] [place], [place] [address]   Surgical PCR screen     Status: None   Collection Time: 09/07/19  9:07 PM   Specimen: Nasal Mucosa; Nasal Swab  Result Value Ref Range Status   MRSA, PCR NEGATIVE NEGATIVE Final   Staphylococcus aureus NEGATIVE NEGATIVE Final    Comment: (NOTE) The Xpert SA Assay (FDA approved for NASAL specimens in patients 67 years of age and older), is one component of a comprehensive surveillance program. It is not intended to diagnose infection nor to guide or monitor treatment. Performed at [facility], [place] [address], [place], [place] [address]          Radiology Studies: DG C-Arm 1-60 Min-No Report  Result Date: 09/08/2019 Fluoroscopy was utilized by the requesting physician.  No radiographic interpretation.   DG HIP OPERATIVE UNILAT W OR W/O PELVIS LEFT  Result Date: 09/08/2019 CLINICAL DATA:  Left hip hemiarthroplasty. EXAM: OPERATIVE LEFT HIP (WITH PELVIS IF PERFORMED) TECHNIQUE: Fluoroscopic spot image(s) were submitted for interpretation post-operatively. FLUOROSCOPY TIME:  18.4 seconds. COMPARISON:  CT left hip from yesterday. FINDINGS: Intraoperative fluoroscopic images demonstrate interval left hip hemiarthroplasty. No evidence of hardware failure or loosening. No acute osseous abnormality. IMPRESSION: Intraoperative fluoroscopic guidance for left hip hemiarthroplasty. Electronically Signed   By: Titus Dubin M.D.   On: 09/08/2019 18:28        Scheduled Meds: . aspirin  81 mg Oral BID WC  . citalopram  10 mg Oral Daily  . clopidogrel  75 mg Oral Daily  . docusate sodium  100  mg Oral BID  . donepezil   5 mg Oral QHS  . furosemide  40 mg Oral Daily  . gabapentin  300 mg Oral TID  . insulin aspart  0-9 Units Subcutaneous TID WC  . isosorbide mononitrate  30 mg Oral Daily  . polyethylene glycol  17 g Oral Daily  . tamsulosin  0.4 mg Oral QHS   Continuous Infusions:    LOS: 3 days     Georgette Shell, MD 09/09/2019, 10:59 AM

## 2019-09-09 NOTE — Plan of Care (Signed)

## 2019-09-09 NOTE — Evaluation (Signed)
Physical Therapy Evaluation Patient Details Name: Casey Collins MRN: [id number] DOB: Jun 02, 1935 Today's Date: 09/09/2019   History of Present Illness  84 y.o. male with medical history significant of history of stroke with residual weakness, slurred speech, HTN,  type 2 diabetes mellitus, dementia, GERD, arthritis, CKD stage IIIb, anemia of chronic disease. Pt had mechanical fall and s/p L hip hemiarthroplasty anterior approach on 8/11.  Clinical Impression  Pt admitted with above diagnosis. Unsure of pt's baseline due to limited info revealed when speaking with pt. Currently pt demonstrates L sided weakness, unsure if L hip weakness from previous stroke or current surgery. Pt able to come to EOB with min A and slow labored movement, but difficulty maintaining upright seated posture, heavy lean to L. Pt able to rise from EOB with BUE assist, requiring therapist to assist pt in maintaining L grip on RW. Pt unable to clear R foot from floor due to lack of weight-shift to LLE so slides R foot in lateral sidestepping. Pt fatigues with mobility, but tolerates remaining up in chair at EOS. Pt would benefit from OT consult for limited LUE use during eval. Pt currently with functional limitations due to the deficits listed below (see PT Problem List). Pt will benefit from skilled PT to increase their independence and safety with mobility to allow discharge to the venue listed below.       Follow Up Recommendations SNF    Equipment Recommendations  None recommended by PT    Recommendations for Other Services OT consult     Precautions / Restrictions Precautions Precautions: Fall Restrictions Weight Bearing Restrictions: No      Mobility  Bed Mobility Overal bed mobility: Needs Assistance Bed Mobility: Supine to Sit  Supine to sit: Min assist  General bed mobility comments: slow, labored movement, pt able to bring BLE to EOB, min A to fully upright trunk with cues to pull on bedrail  with R hand with limited LUE pushing into mattress to assist in uprighting trunk  Transfers Overall transfer level: Needs assistance Equipment used: Rolling walker (2 wheeled) Transfers: Sit to/from [facility] Sit to Stand: Min assist;From elevated surface Stand pivot transfers: Mod assist;From elevated surface  General transfer comment: min A to power up from seated surface with cues on hand placement, mod A to safely pivot over to bedside chair with therapist also assisting pt in maintaining L grip on RW  Ambulation/Gait Ambulation/Gait assistance: Mod assist;Max assist  Assistive device: Rolling walker (2 wheeled) Gait Pattern/deviations: Shuffle;Trunk flexed  General Gait Details: unable to shift weight onto LLE to clear R foot, R foot slides laterally on floor, heavy cues on L foot positioning to avoid excessive rotation, trunk flexed with heavy BUE weightbearing on RW  Stairs            Wheelchair Mobility    Modified Rankin (Stroke Patients Only)       Balance Overall balance assessment: Needs assistance Sitting-balance support: Feet supported;Single extremity supported Sitting balance-Leahy Scale: Poor Sitting balance - Comments: seated EOB with heavy L trunk lean, cues to come back to midline and able to maintain with reminder Postural control: Left lateral lean Standing balance support: During functional activity;Bilateral upper extremity supported Standing balance-Leahy Scale: Zero Standing balance comment: reliant on RW and therapist            Pertinent Vitals/Pain Pain Assessment: Faces Faces Pain Scale: No hurt    Home Living Family/patient expects to be discharged to:: Skilled nursing facility  Prior Function Level of Independence: Needs assistance   Gait / Transfers Assistance Needed: pt reports using SPC seldom, using WC for mobility  ADL's / Homemaking Assistance Needed: pt reports "someone" helps with bathing  and dressing        Hand Dominance        Extremity/Trunk Assessment   Upper Extremity Assessment Upper Extremity Assessment: LUE deficits/detail LUE Deficits / Details: 2+/5 shoulder and elbow, functionally decreased grip strength per inability to maintain grip on RW    Lower Extremity Assessment Lower Extremity Assessment: Generalized weakness;LLE deficits/detail LLE Deficits / Details: knee 4/5, ankle and hip 3/5, denies numbness/tingling    Cervical / Trunk Assessment Cervical / Trunk Assessment: Kyphotic  Communication      Cognition Arousal/Alertness: Lethargic Behavior During Therapy: WFL for tasks assessed/performed Overall Cognitive Status: History of cognitive impairments - at baseline  General Comments: Pt oriented to self and year, unable to recall month or location. Pt reports "I think I fell" regarding situation.      General Comments      Exercises     Assessment/Plan    PT Assessment Patient needs continued PT services  PT Problem List Decreased strength;Decreased range of motion;Decreased activity tolerance;Decreased balance;Decreased mobility;Decreased coordination;Decreased cognition;Decreased knowledge of use of DME;Decreased safety awareness;Pain       PT Treatment Interventions DME instruction;Gait training;Functional mobility training;Therapeutic activities;Therapeutic exercise;Balance training;Cognitive remediation;Patient/family education    PT Goals (Current goals can be found in the Care Plan section)  Acute Rehab PT Goals Patient Stated Goal: "get out of bed" PT Goal Formulation: With patient Time For Goal Achievement: 09/23/19 Potential to Achieve Goals: Fair    Frequency Min 2X/week   Barriers to discharge        Co-evaluation               AM-PAC PT "6 Clicks" Mobility  Outcome Measure Help needed turning from your back to your side while in a flat bed without using bedrails?: A Little Help needed moving from lying  on your back to sitting on the side of a flat bed without using bedrails?: A Little Help needed moving to and from a bed to a chair (including a wheelchair)?: A Little Help needed standing up from a chair using your arms (e.g., wheelchair or bedside chair)?: A Little Help needed to walk in hospital room?: Total Help needed climbing 3-5 steps with a railing? : Total 6 Click Score: 14    End of Session Equipment Utilized During Treatment: Gait belt Activity Tolerance: Patient limited by fatigue Patient left: in chair;with call bell/phone within reach;with chair alarm set Nurse Communication: Mobility status PT Visit Diagnosis: Unsteadiness on feet (R26.81);Other abnormalities of gait and mobility (R26.89);Muscle weakness (generalized) (M62.81);History of falling (Z91.81)    Time: 4081-4481 PT Time Calculation (min) (ACUTE ONLY): 27 min   Charges:   PT Evaluation $PT Eval Moderate Complexity: 1 Mod PT Treatments $Therapeutic Activity: 8-22 mins         Talbot Grumbling PT, DPT 09/09/19, 12:36 PM

## 2019-09-09 NOTE — Progress Notes (Signed)
    Subjective:  Patient reports pain as mild to moderate.  Denies N/V/CP/SOB.   Objective:   VITALS:   Vitals:   09/08/19 2021 09/08/19 2117 09/09/19 0110 09/09/19 0500  BP: 107/71 130/74 (!) 111/59 (!) 99/50  Pulse: 91 97 82 68  Resp: 18 16 16 15   Temp: 97.8 F (36.6 C) 97.9 F (36.6 C) 98 F (36.7 C) (!) 97.5 F (36.4 C)  TempSrc: Oral Oral Oral Oral  SpO2: 96% 95% 92% 96%  Weight:        NAD ABD soft Neurovascular intact Sensation intact distally Intact pulses distally Dorsiflexion/Plantar flexion intact Incision: dressing C/D/I   Lab Results  Component Value Date   WBC 10.2 09/09/2019   HGB 8.9 (L) 09/09/2019   HCT 28.4 (L) 09/09/2019   MCV 97.9 09/09/2019   PLT 149 (L) 09/09/2019   BMET    Component Value Date/Time   NA 135 09/09/2019 0314   K 4.9 09/09/2019 0314   CL 103 09/09/2019 0314   CO2 23 09/09/2019 0314   GLUCOSE 337 (H) 09/09/2019 0314   BUN 29 (H) 09/09/2019 0314   CREATININE 1.70 (H) 09/09/2019 0314   CALCIUM 7.8 (L) 09/09/2019 0314   GFRNONAA 37 (L) 09/09/2019 0314   GFRAA 43 (L) 09/09/2019 0314     Assessment/Plan: 1 Day Post-Op   Principal Problem:   Femur neck fracture (HCC) Active Problems:   H/O: CVA (cerebrovascular accident)   Diabetes mellitus type 2 in nonobese (HCC)   Benign essential HTN   CKD (chronic kidney disease) stage 3, GFR 30-59 ml/min   Sinus bradycardia   Hyperlipemia   Dementia (HCC)   Depression   Anemia due to chronic kidney disease   Fall at home, initial encounter   Femur fracture, left ([place])   Closed left hip fracture ([place])   WBAT with walker DVT ppx: ASA and Plavix, SCDs, TEDS PO pain control ABLA: Treat per hospitalist recommendations Hyperglycemia: Treat per hospitalist recommendations PT/OT Dispo: D/C to SNF, PT at rehab   Dorothyann Peng 09/09/2019, 8:13 AM    [facility] is now [facility] [address], [place], [place]  [address] Phone: [phone] [email or web address] Facebook  [employer]

## 2019-09-09 NOTE — Progress Notes (Signed)
   09/09/19 1241  Vitals  Temp 97.7 F (36.5 C)  BP (!) 110/56  MAP (mmHg) 72  BP Location Left Arm  BP Method Automatic  Patient Position (if appropriate) Sitting  Pulse Rate 61  Pulse Rate Source Monitor  Resp 18  Pt had c/o chest pain, unable to give description of pain. Vitals measured and recorded. Pt not is distress, no loc, alert. Made Pt comfortable. MD is notified via secure chat, EKG done. Few minutes later Pt stated he is feeling better. Monitoring closely.

## 2019-09-09 NOTE — Anesthesia Postprocedure Evaluation (Signed)
Anesthesia Post Note  Patient: Casey Collins  Procedure(s) Performed: LEFT ANTERIOR APPROACH HEMI HIP ARTHROPLASTY (Left )     Patient location during evaluation: PACU Anesthesia Type: General Level of consciousness: sedated and patient cooperative Pain management: pain level controlled Vital Signs Assessment: post-procedure vital signs reviewed and stable Respiratory status: spontaneous breathing Cardiovascular status: stable Anesthetic complications: no   No complications documented.  Last Vitals:  Vitals:   09/09/19 0110 09/09/19 0500  BP: (!) 111/59 (!) 99/50  Pulse: 82 68  Resp: 16 15  Temp: 36.7 C (!) 36.4 C  SpO2: 92% 96%    Last Pain:  Vitals:   09/09/19 0500  TempSrc: Oral  PainSc:                  Nolon Nations

## 2019-09-09 NOTE — Care Management Important Message (Signed)
Important Message  Patient Details IM Letter given to the Patient Name: Casey Collins MRN: [id number] Date of Birth: 1935/05/17   Medicare Important Message Given:  Yes     Kerin Salen 09/09/2019, 10:04 AM

## 2019-09-10 DIAGNOSIS — F0391 Unspecified dementia with behavioral disturbance: Secondary | ICD-10-CM | POA: Diagnosis not present

## 2019-09-10 DIAGNOSIS — E119 Type 2 diabetes mellitus without complications: Secondary | ICD-10-CM | POA: Diagnosis not present

## 2019-09-10 DIAGNOSIS — R296 Repeated falls: Secondary | ICD-10-CM | POA: Diagnosis not present

## 2019-09-10 DIAGNOSIS — I69391 Dysphagia following cerebral infarction: Secondary | ICD-10-CM | POA: Diagnosis not present

## 2019-09-10 DIAGNOSIS — S2232XD Fracture of one rib, left side, subsequent encounter for fracture with routine healing: Secondary | ICD-10-CM | POA: Diagnosis not present

## 2019-09-10 DIAGNOSIS — R4182 Altered mental status, unspecified: Secondary | ICD-10-CM | POA: Diagnosis not present

## 2019-09-10 DIAGNOSIS — S72002D Fracture of unspecified part of neck of left femur, subsequent encounter for closed fracture with routine healing: Secondary | ICD-10-CM | POA: Diagnosis not present

## 2019-09-10 DIAGNOSIS — G4700 Insomnia, unspecified: Secondary | ICD-10-CM | POA: Diagnosis not present

## 2019-09-10 DIAGNOSIS — R2689 Other abnormalities of gait and mobility: Secondary | ICD-10-CM | POA: Diagnosis not present

## 2019-09-10 DIAGNOSIS — Z7401 Bed confinement status: Secondary | ICD-10-CM | POA: Diagnosis not present

## 2019-09-10 DIAGNOSIS — M2550 Pain in unspecified joint: Secondary | ICD-10-CM | POA: Diagnosis not present

## 2019-09-10 DIAGNOSIS — S72001D Fracture of unspecified part of neck of right femur, subsequent encounter for closed fracture with routine healing: Secondary | ICD-10-CM | POA: Diagnosis not present

## 2019-09-10 DIAGNOSIS — R269 Unspecified abnormalities of gait and mobility: Secondary | ICD-10-CM | POA: Diagnosis not present

## 2019-09-10 DIAGNOSIS — I6930 Unspecified sequelae of cerebral infarction: Secondary | ICD-10-CM | POA: Diagnosis not present

## 2019-09-10 DIAGNOSIS — R52 Pain, unspecified: Secondary | ICD-10-CM | POA: Diagnosis not present

## 2019-09-10 DIAGNOSIS — I10 Essential (primary) hypertension: Secondary | ICD-10-CM | POA: Diagnosis not present

## 2019-09-10 DIAGNOSIS — R279 Unspecified lack of coordination: Secondary | ICD-10-CM | POA: Diagnosis not present

## 2019-09-10 DIAGNOSIS — N183 Chronic kidney disease, stage 3 unspecified: Secondary | ICD-10-CM | POA: Diagnosis not present

## 2019-09-10 DIAGNOSIS — I469 Cardiac arrest, cause unspecified: Secondary | ICD-10-CM | POA: Diagnosis not present

## 2019-09-10 DIAGNOSIS — D631 Anemia in chronic kidney disease: Secondary | ICD-10-CM | POA: Diagnosis not present

## 2019-09-10 DIAGNOSIS — S72002A Fracture of unspecified part of neck of left femur, initial encounter for closed fracture: Secondary | ICD-10-CM | POA: Diagnosis not present

## 2019-09-10 DIAGNOSIS — M6281 Muscle weakness (generalized): Secondary | ICD-10-CM | POA: Diagnosis not present

## 2019-09-10 DIAGNOSIS — W19XXXA Unspecified fall, initial encounter: Secondary | ICD-10-CM | POA: Diagnosis not present

## 2019-09-10 DIAGNOSIS — E785 Hyperlipidemia, unspecified: Secondary | ICD-10-CM | POA: Diagnosis not present

## 2019-09-10 DIAGNOSIS — I69354 Hemiplegia and hemiparesis following cerebral infarction affecting left non-dominant side: Secondary | ICD-10-CM | POA: Diagnosis not present

## 2019-09-10 DIAGNOSIS — M25552 Pain in left hip: Secondary | ICD-10-CM | POA: Diagnosis not present

## 2019-09-10 DIAGNOSIS — F0390 Unspecified dementia without behavioral disturbance: Secondary | ICD-10-CM | POA: Diagnosis not present

## 2019-09-10 DIAGNOSIS — I69320 Aphasia following cerebral infarction: Secondary | ICD-10-CM | POA: Diagnosis not present

## 2019-09-10 DIAGNOSIS — F332 Major depressive disorder, recurrent severe without psychotic features: Secondary | ICD-10-CM | POA: Diagnosis not present

## 2019-09-10 DIAGNOSIS — E1140 Type 2 diabetes mellitus with diabetic neuropathy, unspecified: Secondary | ICD-10-CM | POA: Diagnosis not present

## 2019-09-10 LAB — COMPREHENSIVE METABOLIC PANEL
ALT: 8 U/L (ref 0–44)
AST: 18 U/L (ref 15–41)
Albumin: 2.7 g/dL — ABNORMAL LOW (ref 3.5–5.0)
Alkaline Phosphatase: 63 U/L (ref 38–126)
Anion gap: 7 (ref 5–15)
BUN: 28 mg/dL — ABNORMAL HIGH (ref 8–23)
CO2: 24 mmol/L (ref 22–32)
Calcium: 7.7 mg/dL — ABNORMAL LOW (ref 8.9–10.3)
Chloride: 106 mmol/L (ref 98–111)
Creatinine, Ser: 1.51 mg/dL — ABNORMAL HIGH (ref 0.61–1.24)
GFR calc Af Amer: 49 mL/min — ABNORMAL LOW (ref >60)
GFR calc non Af Amer: 42 mL/min — ABNORMAL LOW (ref >60)
Glucose, Bld: 131 mg/dL — ABNORMAL HIGH (ref 70–99)
Potassium: 3.9 mmol/L (ref 3.5–5.1)
Sodium: 137 mmol/L (ref 135–145)
Total Bilirubin: 0.2 mg/dL — ABNORMAL LOW (ref 0.3–1.2)
Total Protein: 5.3 g/dL — ABNORMAL LOW (ref 6.5–8.1)

## 2019-09-10 LAB — CBC
HCT: 24.8 % — ABNORMAL LOW (ref 39.0–52.0)
Hemoglobin: 7.8 g/dL — ABNORMAL LOW (ref 13.0–17.0)
MCH: 30.6 pg (ref 26.0–34.0)
MCHC: 31.5 g/dL (ref 30.0–36.0)
MCV: 97.3 fL (ref 80.0–100.0)
Platelets: 145 10*3/uL — ABNORMAL LOW (ref 150–400)
RBC: 2.55 MIL/uL — ABNORMAL LOW (ref 4.22–5.81)
RDW: 14.3 % (ref 11.5–15.5)
WBC: 8.7 10*3/uL (ref 4.0–10.5)
nRBC: 0 % (ref 0.0–0.2)

## 2019-09-10 LAB — GLUCOSE, CAPILLARY
Glucose-Capillary: 111 mg/dL — ABNORMAL HIGH (ref 70–99)
Glucose-Capillary: 184 mg/dL — ABNORMAL HIGH (ref 70–99)
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
## 2019-09-10 NOTE — Discharge Summary (Signed)
Physician Discharge Summary  Casey Collins FUX:[id number] DOB: Jan 24, 1936 DOA: 09/06/2019  PCP: Sande Brothers, MD  Admit date: 09/06/2019 Discharge date: 09/10/2019  Admitted From:home Disposition:  Nursing home Recommendations for Outpatient Follow-up:  1. Follow up with PCP in 1-2 weeks 2. Please obtain BMP/CBC in one week  Home Health none Equipment/Devices: none Discharge Condition stable CODE STATUS:full Diet recommendation: cardiac Brief/Interim Summary:Casey Jori Moll [name] a 84 y.o.malewith medical history significant ofhistory of stroke with residual right-sided weakness, slurred speech, hypertension, hyperlipidemia, type 2 diabetes mellitus, dementia, depression, GERD, arthritis CKD stage IIIb, anemia of chronic disease. Patient presented after having a fall and suffering a left femur fracture. Plan for surgery   Discharge Diagnoses:  Principal Problem:   Femur neck fracture (HCC) Active Problems:   H/O: CVA (cerebrovascular accident)   Diabetes mellitus type 2 in nonobese (HCC)   Benign essential HTN   CKD (chronic kidney disease) stage 3, GFR 30-59 ml/min   Sinus bradycardia   Hyperlipemia   Dementia (HCC)   Depression   Anemia due to chronic kidney disease   Fall at home, initial encounter   Femur fracture, left ([place])   Closed left hip fracture ([facility])   #1 left femur fracture-s/p left hemi arthroplasty 8/11  WBAT per ortho Seen by physical therapy recommends SNF. Follow-up with [facility]. Weightbearing as tolerated with walker.  Aspirin for DVT prophylaxis.  Continue Plavix.  #2 type 2 diabetes with diabetic neuropathy continue Lantus.  Continue gabapentin.    #3 essential hypertension  blood pressure improved 149/65.  Continue Imdur and lisinopril.  He was also on Lasix prior to admission which is on hold at this time restart Lasix as needed.    #4 hyperlipidemia on Lipitor  #5 BPH on Flomax  #6 history of Alzheimer's dementia on  Aricept  #7 history of stroke on aspirin and Plavix  #8 chronic combined diastolic and systolic heart failure stable please note patient was on Lasix at home which is on hold due to AKI which is resolving.  Restart Lasix as an outpatient as needed.  #9 chronic sinus bradycardia is asymptomatic.  Resolved.  #10 Abla-hemoglobin 7.9 no evidence of active bleeding.  Follow-up CBC and CMP in 1 week.  #11 AKI creatinine 1.51 on the day of discharge which has improved with hydration.  Patient is on ACE inhibitor please monitor closely.  Lasix has been on hold.    Estimated body mass index is 24.51 kg/m as calculated from the following:   Height as of this encounter: 5\' 11"  (1.803 m).   Weight as of this encounter: 79.7 kg.  Discharge Instructions  Discharge Instructions    Diet - low sodium heart healthy   Complete by: As directed    If the dressing is still on your incision site when you go home, remove it on the third day after your surgery date. Remove dressing if it begins to fall off, or if it is dirty or damaged before the third day.   Complete by: As directed    Increase activity slowly   Complete by: As directed      Allergies as of 09/10/2019   No Known Allergies     Medication List    STOP taking these medications   aspirin EC 81 MG tablet Replaced by: aspirin 81 MG chewable tablet   cephALEXin 500 MG capsule Commonly known as: KEFLEX   furosemide 40 MG tablet Commonly known as: LASIX   HYDROcodone-acetaminophen 5-325 MG tablet Commonly known as:  NORCO/VICODIN   LORazepam 0.5 MG tablet Commonly known as: ATIVAN     TAKE these medications   acetaminophen 500 MG tablet Commonly known as: TYLENOL Take 500 mg by mouth every 4 (four) hours as needed for mild pain or moderate pain.   amLODipine 5 MG tablet Commonly known as: NORVASC Take 1 tablet (5 mg total) by mouth daily.   aspirin 81 MG chewable tablet Chew 1 tablet (81 mg total) by mouth 2 (two)  times daily with a meal. Replaces: aspirin EC 81 MG tablet   atorvastatin 40 MG tablet Commonly known as: LIPITOR Take 1 tablet (40 mg total) by mouth daily at 6 PM.   Basaglar KwikPen 100 UNIT/ML Inject 10 Units into the skin every 12 (twelve) hours. What changed: Another medication with the same name was removed. Continue taking this medication, and follow the directions you see here.   citalopram 10 MG tablet Commonly known as: CELEXA Take 1 tablet (10 mg total) by mouth daily.   clopidogrel 75 MG tablet Commonly known as: PLAVIX Take 1 tablet (75 mg total) by mouth daily.   DAILY-VITE PO Take 1 tablet by mouth in the morning.   donepezil 5 MG tablet Commonly known as: ARICEPT Take 5 mg by mouth at bedtime.   gabapentin 300 MG capsule Commonly known as: NEURONTIN Take 300 mg by mouth 3 (three) times daily.   hydrOXYzine 25 MG/ML injection Commonly known as: VISTARIL Inject 25 mg into the muscle every 6 (six) hours as needed (aggression).   insulin regular 100 units/mL injection Commonly known as: NOVOLIN R Inject 5 Units into the skin 2 (two) times daily with a meal. With breakfast and dinner   isosorbide mononitrate 30 MG 24 hr tablet Commonly known as: IMDUR Take 1 tablet (30 mg total) by mouth daily.   lisinopril 10 MG tablet Commonly known as: ZESTRIL TAKE 1 TABLET BY MOUTH DAILY   melatonin 3 MG Tabs tablet Take 1 tablet by mouth at bedtime as needed (sleep).   ondansetron 4 MG disintegrating tablet Commonly known as: Zofran ODT 4mg  ODT q4 hours prn nausea/vomit What changed:   how much to take  how to take this  when to take this  reasons to take this  additional instructions   oxyCODONE 5 MG immediate release tablet Commonly known as: Oxy IR/ROXICODONE Take 1 tablet (5 mg total) by mouth every 4 (four) hours as needed for up to 7 days for moderate pain.   potassium chloride 10 MEQ tablet Commonly known as: KLOR-CON Take 10 mEq by mouth  daily.   tamsulosin 0.4 MG Caps capsule Commonly known as: FLOMAX Take 0.4 mg by mouth at bedtime.   tiZANidine 2 MG tablet Commonly known as: ZANAFLEX Take 2 mg by mouth every 8 (eight) hours as needed for muscle spasms.   vitamin C 1000 MG tablet Take 1 tablet (1,000 mg total) by mouth daily.            Discharge Care Instructions  (From admission, onward)         Start     Ordered   09/10/19 0000  If the dressing is still on your incision site when you go home, remove it on the third day after your surgery date. Remove dressing if it begins to fall off, or if it is dirty or damaged before the third day.        09/10/19 1201          Contact information for  follow-up providers    Swinteck, Aaron Edelman, MD. Schedule an appointment as soon as possible for a visit in 2 weeks.   Specialty: Orthopedic Surgery Why: For wound re-check Contact information: [address] [place] [address] [phone]            Contact information for after-discharge care    [facility] .   Service: Skilled Nursing Contact information: [address] [place] [phone]                 No Known Allergies  Consultations: Ortho  Procedures/Studies: CT HIP LEFT WO CONTRAST  Result Date: 09/07/2019 CLINICAL DATA:  Evaluate left hip fracture EXAM: CT OF THE LEFT HIP WITHOUT CONTRAST TECHNIQUE: Multidetector CT imaging of the left hip was performed according to the standard protocol. Multiplanar CT image reconstructions were also generated. COMPARISON:  X-ray 09/06/2019 FINDINGS: Bones/Joint/Cartilage Acute transcervical fracture of the left femoral neck with mild impaction upon the lateral fracture margin (series 14, image 46). No significant displacement or angulation. Fracture line does not appear to involve the femoral head articular surface or the intertrochanteric region. Left hip joint is  intact without dislocation. Mild to moderate left hip arthropathy. Small to moderate hip joint effusion. Visualized portion of the left hemipelvis is intact. Ligaments Suboptimally assessed by CT. Muscles and Tendons No acute musculotendinous abnormality by CT. Soft tissues No soft tissue fluid collection or hematoma. Vascular calcifications. IMPRESSION: 1. Acute mildly impacted transcervical fracture of the left femoral neck. 2. Small to moderate left hip joint effusion. 3. Mild to moderate left hip arthropathy. Electronically Signed   By: Davina Poke D.O.   On: 09/07/2019 10:33   DG Chest Port 1 View  Result Date: 09/06/2019 CLINICAL DATA:  Fellow early this morning with a resultant left hip fracture. EXAM: PORTABLE CHEST 1 VIEW COMPARISON:  05/06/2019 FINDINGS: Stable enlarged cardiac silhouette and tortuous aorta. Clear lungs with normal vascularity. No acute bony abnormality. IMPRESSION: No acute abnormality. Stable cardiomegaly. Electronically Signed   By: Claudie Revering M.D.   On: 09/06/2019 14:00   DG Knee Left Port  Result Date: 09/07/2019 CLINICAL DATA:  Left knee pain. EXAM: PORTABLE LEFT KNEE - 1-2 VIEW COMPARISON:  None. FINDINGS: Minimal degenerative changes for age. No acute fracture, osteochondral lesion or chondrocalcinosis. No joint effusion. Vascular calcifications are noted. IMPRESSION: Minimal degenerative changes for age. No acute bony findings or joint effusion. Electronically Signed   By: Marijo Sanes M.D.   On: 09/07/2019 09:33   DG C-Arm 1-60 Min-No Report  Result Date: 09/08/2019 Fluoroscopy was utilized by the requesting physician.  No radiographic interpretation.   DG HIP OPERATIVE UNILAT W OR W/O PELVIS LEFT  Result Date: 09/08/2019 CLINICAL DATA:  Left hip hemiarthroplasty. EXAM: OPERATIVE LEFT HIP (WITH PELVIS IF PERFORMED) TECHNIQUE: Fluoroscopic spot image(s) were submitted for interpretation post-operatively. FLUOROSCOPY TIME:  18.4 seconds. COMPARISON:  CT left  hip from yesterday. FINDINGS: Intraoperative fluoroscopic images demonstrate interval left hip hemiarthroplasty. No evidence of hardware failure or loosening. No acute osseous abnormality. IMPRESSION: Intraoperative fluoroscopic guidance for left hip hemiarthroplasty. Electronically Signed   By: Titus Dubin M.D.   On: 09/08/2019 18:28   DG Hip Unilat With Pelvis 2-3 Views Left  Result Date: 09/06/2019 CLINICAL DATA:  Left hip pain following a fall this morning. Known left hip fracture. EXAM: DG HIP (WITH OR WITHOUT PELVIS) 2-3V LEFT COMPARISON:  Abdomen and pelvis CT dated 05/01/2019 FINDINGS:  Interval left femoral neck fracture with mild valgus angulation. No significant displacement. Previously demonstrated right hip degenerative changes. Atheromatous arterial calcifications. IMPRESSION: Interval left femoral neck fracture with mild valgus angulation. Electronically Signed   By: Claudie Revering M.D.   On: 09/06/2019 14:00    (Echo, Carotid, EGD, Colonoscopy, ERCP)    Subjective: Resting in bed awake alert  Discharge Exam: Vitals:   09/10/19 0449 09/10/19 1012  BP: (!) 143/55 (!) 149/65  Pulse: 74 71  Resp: 17   Temp: (!) 97.5 F (36.4 C)   SpO2: 96%    Vitals:   09/09/19 2058 09/10/19 0449 09/10/19 1012 09/10/19 1013  BP: (!) 141/60 (!) 143/55 (!) 149/65   Pulse: (!) 58 74 71   Resp: 16 17    Temp: 98.3 F (36.8 C) (!) 97.5 F (36.4 C)    TempSrc: Oral Oral    SpO2: 90% 96%    Weight:    79.7 kg  Height:    5\' 11"  (1.803 m)    General: Pt is alert, awake, not in acute distress Cardiovascular: RRR, S1/S2 +, no rubs, no gallops Respiratory: CTA bilaterally, no wheezing, no rhonchi Abdominal: Soft, NT, ND, bowel sounds + Extremities: Left hip incision covered with dressings no evidence of drainage noted   The results of significant diagnostics from this hospitalization (including imaging, microbiology, ancillary and laboratory) are listed below for reference.      Microbiology: Recent Results (from the past 240 hour(s))  SARS Coronavirus 2 by RT PCR (hospital order, performed in [facility] hospital lab) Nasopharyngeal Nasopharyngeal Swab     Status: None   Collection Time: 09/06/19  1:15 PM   Specimen: Nasopharyngeal Swab  Result Value Ref Range Status   SARS Coronavirus 2 NEGATIVE NEGATIVE Final    Comment: (NOTE) SARS-CoV-2 target nucleic acids are NOT DETECTED.  The SARS-CoV-2 RNA is generally detectable in upper and lower respiratory specimens during the acute phase of infection. The lowest concentration of SARS-CoV-2 viral copies this assay can detect is 250 copies / mL. A negative result does not preclude SARS-CoV-2 infection and should not be used as the sole basis for treatment or other patient management decisions.  A negative result may occur with improper specimen collection / handling, submission of specimen other than nasopharyngeal swab, presence of viral mutation(s) within the areas targeted by this assay, and inadequate number of viral copies (<250 copies / mL). A negative result must be combined with clinical observations, patient history, and epidemiological information.  Fact Sheet for Patients:   StrictlyIdeas.no  Fact Sheet for Healthcare Providers: BankingDealers.co.za  This test is not yet approved or  cleared by the [place] FDA and has been authorized for detection and/or diagnosis of SARS-CoV-2 by FDA under an Emergency Use Authorization (EUA).  This EUA will remain in effect (meaning this test can be used) for the duration of the COVID-19 declaration under Section 564(b)(1) of the Act, 21 U.S.C. section 360bbb-3(b)(1), unless the authorization is terminated or revoked sooner.  Performed at [facility], [place] [address], [place], [place] [address]   Surgical PCR screen     Status: None   Collection Time: 09/07/19  9:07 PM   Specimen: Nasal Mucosa;  Nasal Swab  Result Value Ref Range Status   MRSA, PCR NEGATIVE NEGATIVE Final   Staphylococcus aureus NEGATIVE NEGATIVE Final    Comment: (NOTE) The Xpert SA Assay (FDA approved for NASAL specimens in patients 70 years of age and older), is  one component of a comprehensive surveillance program. It is not intended to diagnose infection nor to guide or monitor treatment. Performed at [facility], [place] [address], [place], [place] [address]   SARS Coronavirus 2 by RT PCR (hospital order, performed in [facility] hospital lab) Nasopharyngeal Nasopharyngeal Swab     Status: None   Collection Time: 09/09/19  4:28 PM   Specimen: Nasopharyngeal Swab  Result Value Ref Range Status   SARS Coronavirus 2 NEGATIVE NEGATIVE Final    Comment: (NOTE) SARS-CoV-2 target nucleic acids are NOT DETECTED.  The SARS-CoV-2 RNA is generally detectable in upper and lower respiratory specimens during the acute phase of infection. The lowest concentration of SARS-CoV-2 viral copies this assay can detect is 250 copies / mL. A negative result does not preclude SARS-CoV-2 infection and should not be used as the sole basis for treatment or other patient management decisions.  A negative result may occur with improper specimen collection / handling, submission of specimen other than nasopharyngeal swab, presence of viral mutation(s) within the areas targeted by this assay, and inadequate number of viral copies (<250 copies / mL). A negative result must be combined with clinical observations, patient history, and epidemiological information.  Fact Sheet for Patients:   StrictlyIdeas.no  Fact Sheet for Healthcare Providers: BankingDealers.co.za  This test is not yet approved or  cleared by the [place] FDA and has been authorized for detection and/or diagnosis of SARS-CoV-2 by FDA under an Emergency Use Authorization (EUA).  This EUA will  remain in effect (meaning this test can be used) for the duration of the COVID-19 declaration under Section 564(b)(1) of the Act, 21 U.S.C. section 360bbb-3(b)(1), unless the authorization is terminated or revoked sooner.  Performed at [facility], [place] [address], [place], [place] [address]      Labs: BNP (last 3 results) No results for input(s): BNP in the last 8760 hours. Basic Metabolic Panel: Recent Labs  Lab 09/06/19 1315 09/06/19 1315 09/06/19 1550 09/07/19 0523 09/08/19 0300 09/08/19 1713 09/09/19 0314 09/10/19 0325  NA 139   < >  --  141 138 141 135 137  K 4.3   < >  --  4.0 4.2 4.4 4.9 3.9  CL 103   < >  --  101 104 103 103 106  CO2 28  --   --  28 25  --  23 24  GLUCOSE 87   < >  --  121* 154* 146* 337* 131*  BUN 38*   < >  --  32* 29* 24* 29* 28*  CREATININE 1.99*   < > 1.98* 1.57* 1.39* 1.60* 1.70* 1.51*  CALCIUM 9.0  --   --  9.1 8.5*  --  7.8* 7.7*  MG  --   --  1.9  --   --   --   --   --   PHOS  --   --  3.8  --   --   --   --   --    < > = values in this interval not displayed.   Liver Function Tests: Recent Labs  Lab 09/07/19 0523 09/09/19 0314 09/10/19 0325  AST 19 21 18   ALT 16 12 8   ALKPHOS 89 67 63  BILITOT 0.8 0.5 0.2*  PROT 6.5 5.2* 5.3*  ALBUMIN 3.6 2.8* 2.7*   No results for input(s): LIPASE, AMYLASE in the last 168 hours. No results for input(s): AMMONIA in the last 168  hours. CBC: Recent Labs  Lab 09/06/19 1315 09/06/19 1315 09/06/19 1550 09/06/19 1550 09/07/19 0523 09/08/19 0300 09/08/19 1713 09/09/19 0314 09/10/19 0325  WBC 10.3   < > 11.2*  --  9.7 9.6  --  10.2 8.7  NEUTROABS 7.8*  --   --   --   --   --   --   --   --   HGB 11.2*   < > 11.5*   < > 12.2* 11.4* 10.2* 8.9* 7.8*  HCT 36.6*   < > 37.2*   < > 39.2 35.8* 30.0* 28.4* 24.8*  MCV 95.1   < > 94.4  --  94.2 95.2  --  97.9 97.3  PLT 194   < > 210  --  199 176  --  149* 145*   < > = values in this interval not displayed.   Cardiac  Enzymes: No results for input(s): CKTOTAL, CKMB, CKMBINDEX, TROPONINI in the last 168 hours. BNP: Invalid input(s): POCBNP CBG: Recent Labs  Lab 09/09/19 0716 09/09/19 1150 09/09/19 1640 09/09/19 2100 09/10/19 0722  GLUCAP 253* 279* 176* 166* 111*   D-Dimer No results for input(s): DDIMER in the last 72 hours. Hgb A1c Recent Labs    09/07/19 1611  HGBA1C 6.9*   Lipid Profile No results for input(s): CHOL, HDL, LDLCALC, TRIG, CHOLHDL, LDLDIRECT in the last 72 hours. Thyroid function studies No results for input(s): TSH, T4TOTAL, T3FREE, THYROIDAB in the last 72 hours.  Invalid input(s): FREET3 Anemia work up No results for input(s): VITAMINB12, FOLATE, FERRITIN, TIBC, IRON, RETICCTPCT in the last 72 hours. Urinalysis    Component Value Date/Time   COLORURINE STRAW (A) 09/07/2019 0524   APPEARANCEUR CLEAR 09/07/2019 0524   LABSPEC 1.010 09/07/2019 0524   PHURINE 5.0 09/07/2019 0524   GLUCOSEU NEGATIVE 09/07/2019 0524   HGBUR NEGATIVE 09/07/2019 0524   BILIRUBINUR NEGATIVE 09/07/2019 0524   KETONESUR NEGATIVE 09/07/2019 0524   PROTEINUR NEGATIVE 09/07/2019 0524   UROBILINOGEN 1.0 12/09/2012 1651   NITRITE NEGATIVE 09/07/2019 0524   LEUKOCYTESUR NEGATIVE 09/07/2019 0524   Sepsis Labs Invalid input(s): PROCALCITONIN,  WBC,  LACTICIDVEN Microbiology Recent Results (from the past 240 hour(s))  SARS Coronavirus 2 by RT PCR (hospital order, performed in [facility] hospital lab) Nasopharyngeal Nasopharyngeal Swab     Status: None   Collection Time: 09/06/19  1:15 PM   Specimen: Nasopharyngeal Swab  Result Value Ref Range Status   SARS Coronavirus 2 NEGATIVE NEGATIVE Final    Comment: (NOTE) SARS-CoV-2 target nucleic acids are NOT DETECTED.  The SARS-CoV-2 RNA is generally detectable in upper and lower respiratory specimens during the acute phase of infection. The lowest concentration of SARS-CoV-2 viral copies this assay can detect is 250 copies / mL. A negative  result does not preclude SARS-CoV-2 infection and should not be used as the sole basis for treatment or other patient management decisions.  A negative result may occur with improper specimen collection / handling, submission of specimen other than nasopharyngeal swab, presence of viral mutation(s) within the areas targeted by this assay, and inadequate number of viral copies (<250 copies / mL). A negative result must be combined with clinical observations, patient history, and epidemiological information.  Fact Sheet for Patients:   StrictlyIdeas.no  Fact Sheet for Healthcare Providers: BankingDealers.co.za  This test is not yet approved or  cleared by the [place] FDA and has been authorized for detection and/or diagnosis of SARS-CoV-2 by FDA under an Emergency Use Authorization (EUA).  This EUA will remain in effect (meaning this test can be used) for the duration of the COVID-19 declaration under Section 564(b)(1) of the Act, 21 U.S.C. section 360bbb-3(b)(1), unless the authorization is terminated or revoked sooner.  Performed at [facility], [place] [address], [place], [place] [address]   Surgical PCR screen     Status: None   Collection Time: 09/07/19  9:07 PM   Specimen: Nasal Mucosa; Nasal Swab  Result Value Ref Range Status   MRSA, PCR NEGATIVE NEGATIVE Final   Staphylococcus aureus NEGATIVE NEGATIVE Final    Comment: (NOTE) The Xpert SA Assay (FDA approved for NASAL specimens in patients 37 years of age and older), is one component of a comprehensive surveillance program. It is not intended to diagnose infection nor to guide or monitor treatment. Performed at [facility], [place] [address], [place], [place] [address]   SARS Coronavirus 2 by RT PCR (hospital order, performed in [facility] hospital lab) Nasopharyngeal Nasopharyngeal Swab     Status: None   Collection Time: 09/09/19  4:28 PM    Specimen: Nasopharyngeal Swab  Result Value Ref Range Status   SARS Coronavirus 2 NEGATIVE NEGATIVE Final    Comment: (NOTE) SARS-CoV-2 target nucleic acids are NOT DETECTED.  The SARS-CoV-2 RNA is generally detectable in upper and lower respiratory specimens during the acute phase of infection. The lowest concentration of SARS-CoV-2 viral copies this assay can detect is 250 copies / mL. A negative result does not preclude SARS-CoV-2 infection and should not be used as the sole basis for treatment or other patient management decisions.  A negative result may occur with improper specimen collection / handling, submission of specimen other than nasopharyngeal swab, presence of viral mutation(s) within the areas targeted by this assay, and inadequate number of viral copies (<250 copies / mL). A negative result must be combined with clinical observations, patient history, and epidemiological information.  Fact Sheet for Patients:   StrictlyIdeas.no  Fact Sheet for Healthcare Providers: BankingDealers.co.za  This test is not yet approved or  cleared by the [place] FDA and has been authorized for detection and/or diagnosis of SARS-CoV-2 by FDA under an Emergency Use Authorization (EUA).  This EUA will remain in effect (meaning this test can be used) for the duration of the COVID-19 declaration under Section 564(b)(1) of the Act, 21 U.S.C. section 360bbb-3(b)(1), unless the authorization is terminated or revoked sooner.  Performed at [facility], [place] [address] [place], [place] [address]      Time coordinating discharge: 38 min SIGNED:   Georgette Shell, MD  [facility] 09/10/2019, 12:01 PM  If 7PM-7AM, please contact night-coverage [email or web address] Password [email or web address]

## 2019-09-10 NOTE — Progress Notes (Signed)
Report called to [facility]  

## 2019-09-10 NOTE — TOC Transition Note (Signed)
Transition of Care [facility]) - CM/SW Discharge Note   Patient Details  Name: Casey Collins MRN: [id number] Date of Birth: April 27, 1935  [facility] CM/SW Contact:  Lia Hopping, [place] Phone Number: 09/10/2019, 12:13 PM   Clinical Narrative:    CSW confirm [facility] ready to accept the pt. Today.  Nurse call report to: [phone] Room 224B PTAR arranged to transport  CSW notified son Octavia Bruckner   Final next level of care: [place] Barriers to Discharge: Barriers Resolved   Patient Goals and CMS Choice Patient states their goals for this hospitalization and ongoing recovery are:: "He is a resident at [facility] and will return."   Choice offered to / list presented to : NA  Discharge Placement   Existing PASRR number confirmed : 09/10/19          Patient chooses bed at: [facility] Patient to be transferred to facility by: [place] Name of family member notified: Tim Patient and family notified of of transfer: 09/10/19  Discharge Plan and Services In-house Referral: Clinical Social Work Discharge Planning Services: [employer] Consult Post Acute Care Choice: [place]          DME Arranged: N/A DME Agency: NA         [place] Agency: NA        Social Determinants of Health (SDOH) Interventions     Readmission Risk Interventions No flowsheet data found.

## 2019-09-10 NOTE — Progress Notes (Signed)
Attempted to call report. No answer after being transferred. Will try again.

## 2019-09-10 NOTE — Plan of Care (Signed)
  Problem: Nutrition: Goal: Adequate nutrition will be maintained Outcome: Progressing   Problem: Coping: Goal: Level of anxiety will decrease Outcome: Progressing   

## 2019-09-14 DIAGNOSIS — E785 Hyperlipidemia, unspecified: Secondary | ICD-10-CM | POA: Diagnosis not present

## 2019-09-14 DIAGNOSIS — F0390 Unspecified dementia without behavioral disturbance: Secondary | ICD-10-CM | POA: Diagnosis not present

## 2019-09-14 DIAGNOSIS — I10 Essential (primary) hypertension: Secondary | ICD-10-CM | POA: Diagnosis not present

## 2019-09-14 DIAGNOSIS — S72001D Fracture of unspecified part of neck of right femur, subsequent encounter for closed fracture with routine healing: Secondary | ICD-10-CM | POA: Diagnosis not present

## 2019-09-21 DIAGNOSIS — S72002D Fracture of unspecified part of neck of left femur, subsequent encounter for closed fracture with routine healing: Secondary | ICD-10-CM | POA: Diagnosis not present

## 2019-09-21 DIAGNOSIS — W19XXXA Unspecified fall, initial encounter: Secondary | ICD-10-CM | POA: Diagnosis not present

## 2019-09-21 DIAGNOSIS — R296 Repeated falls: Secondary | ICD-10-CM | POA: Diagnosis not present

## 2019-09-21 DIAGNOSIS — M25552 Pain in left hip: Secondary | ICD-10-CM | POA: Diagnosis not present

## 2019-09-21 DIAGNOSIS — G4700 Insomnia, unspecified: Secondary | ICD-10-CM | POA: Diagnosis not present

## 2019-09-21 DIAGNOSIS — M6281 Muscle weakness (generalized): Secondary | ICD-10-CM | POA: Diagnosis not present

## 2019-10-29 DIAGNOSIS — F331 Major depressive disorder, recurrent, moderate: Secondary | ICD-10-CM | POA: Diagnosis not present

## 2019-10-29 DIAGNOSIS — F0390 Unspecified dementia without behavioral disturbance: Secondary | ICD-10-CM | POA: Diagnosis not present

## 2019-10-29 DIAGNOSIS — G4701 Insomnia due to medical condition: Secondary | ICD-10-CM | POA: Diagnosis not present

## 2019-10-29 DIAGNOSIS — F064 Anxiety disorder due to known physiological condition: Secondary | ICD-10-CM | POA: Diagnosis not present

## 2019-12-03 DIAGNOSIS — F331 Major depressive disorder, recurrent, moderate: Secondary | ICD-10-CM | POA: Diagnosis not present

## 2019-12-03 DIAGNOSIS — G4701 Insomnia due to medical condition: Secondary | ICD-10-CM | POA: Diagnosis not present

## 2019-12-03 DIAGNOSIS — F0390 Unspecified dementia without behavioral disturbance: Secondary | ICD-10-CM | POA: Diagnosis not present

## 2019-12-03 DIAGNOSIS — F064 Anxiety disorder due to known physiological condition: Secondary | ICD-10-CM | POA: Diagnosis not present

## 2019-12-06 DIAGNOSIS — N390 Urinary tract infection, site not specified: Secondary | ICD-10-CM | POA: Diagnosis not present

## 2019-12-31 DIAGNOSIS — F331 Major depressive disorder, recurrent, moderate: Secondary | ICD-10-CM | POA: Diagnosis not present

## 2019-12-31 DIAGNOSIS — F0390 Unspecified dementia without behavioral disturbance: Secondary | ICD-10-CM | POA: Diagnosis not present

## 2019-12-31 DIAGNOSIS — F064 Anxiety disorder due to known physiological condition: Secondary | ICD-10-CM | POA: Diagnosis not present

## 2019-12-31 DIAGNOSIS — G4701 Insomnia due to medical condition: Secondary | ICD-10-CM | POA: Diagnosis not present

## 2020-01-03 IMAGING — CR DG HIP (WITH OR WITHOUT PELVIS) 2-3V*R*
3 series · 3 of 3 positions shown · non-contrast
Comparison: 10/28/2018

CLINICAL DATA: Chronic back pain.

EXAM:
DG HIP (WITH OR WITHOUT PELVIS) 2-3V RIGHT

[t pelvis ap]
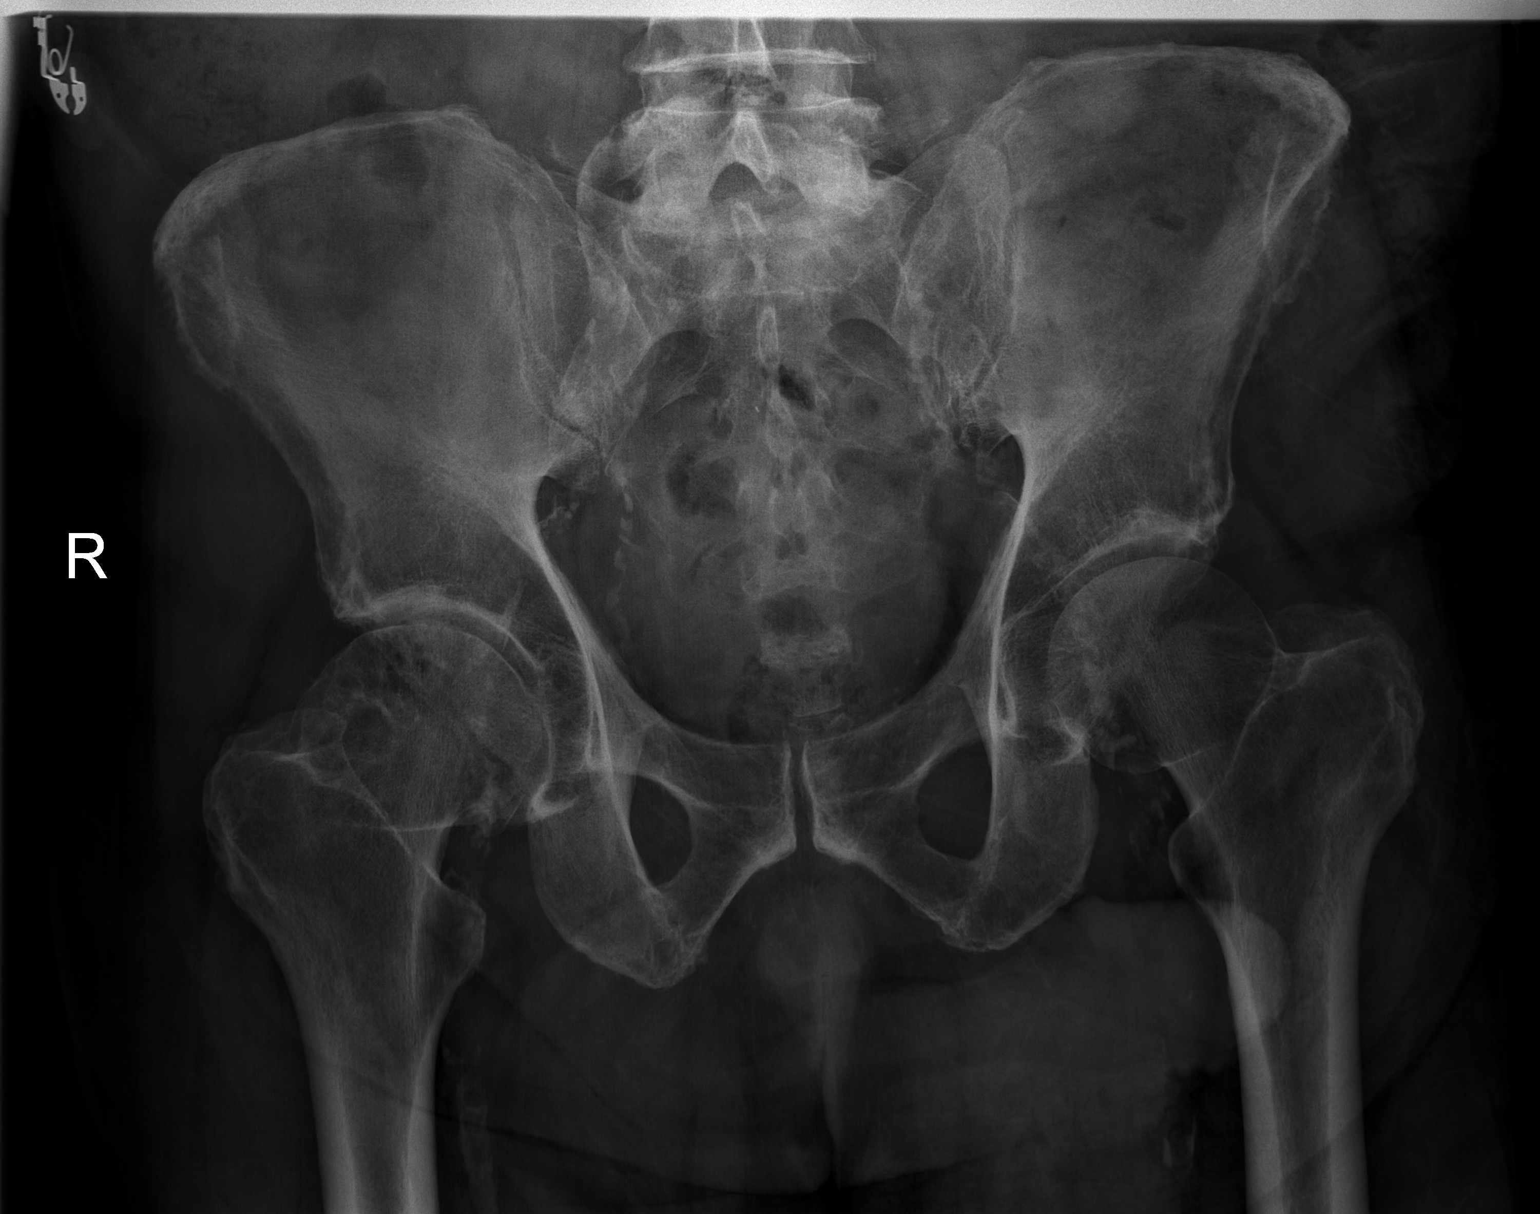

[t hip ap right]
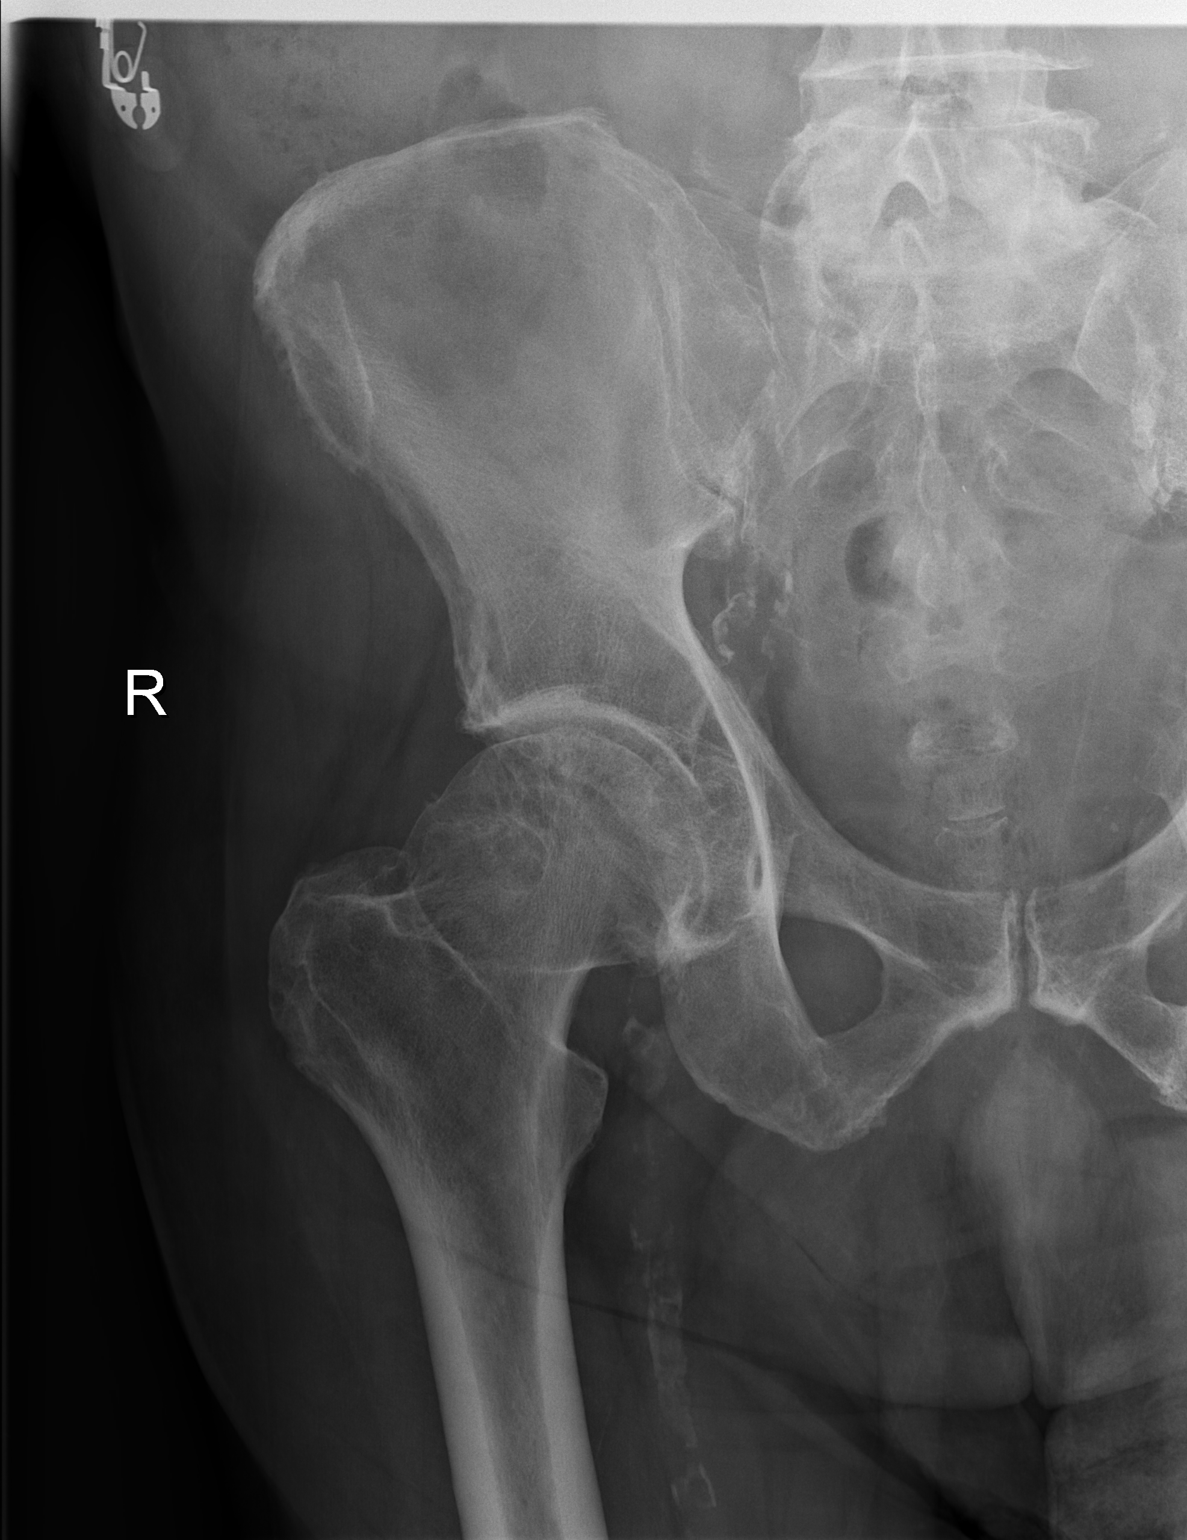

[t hip frog leg right]
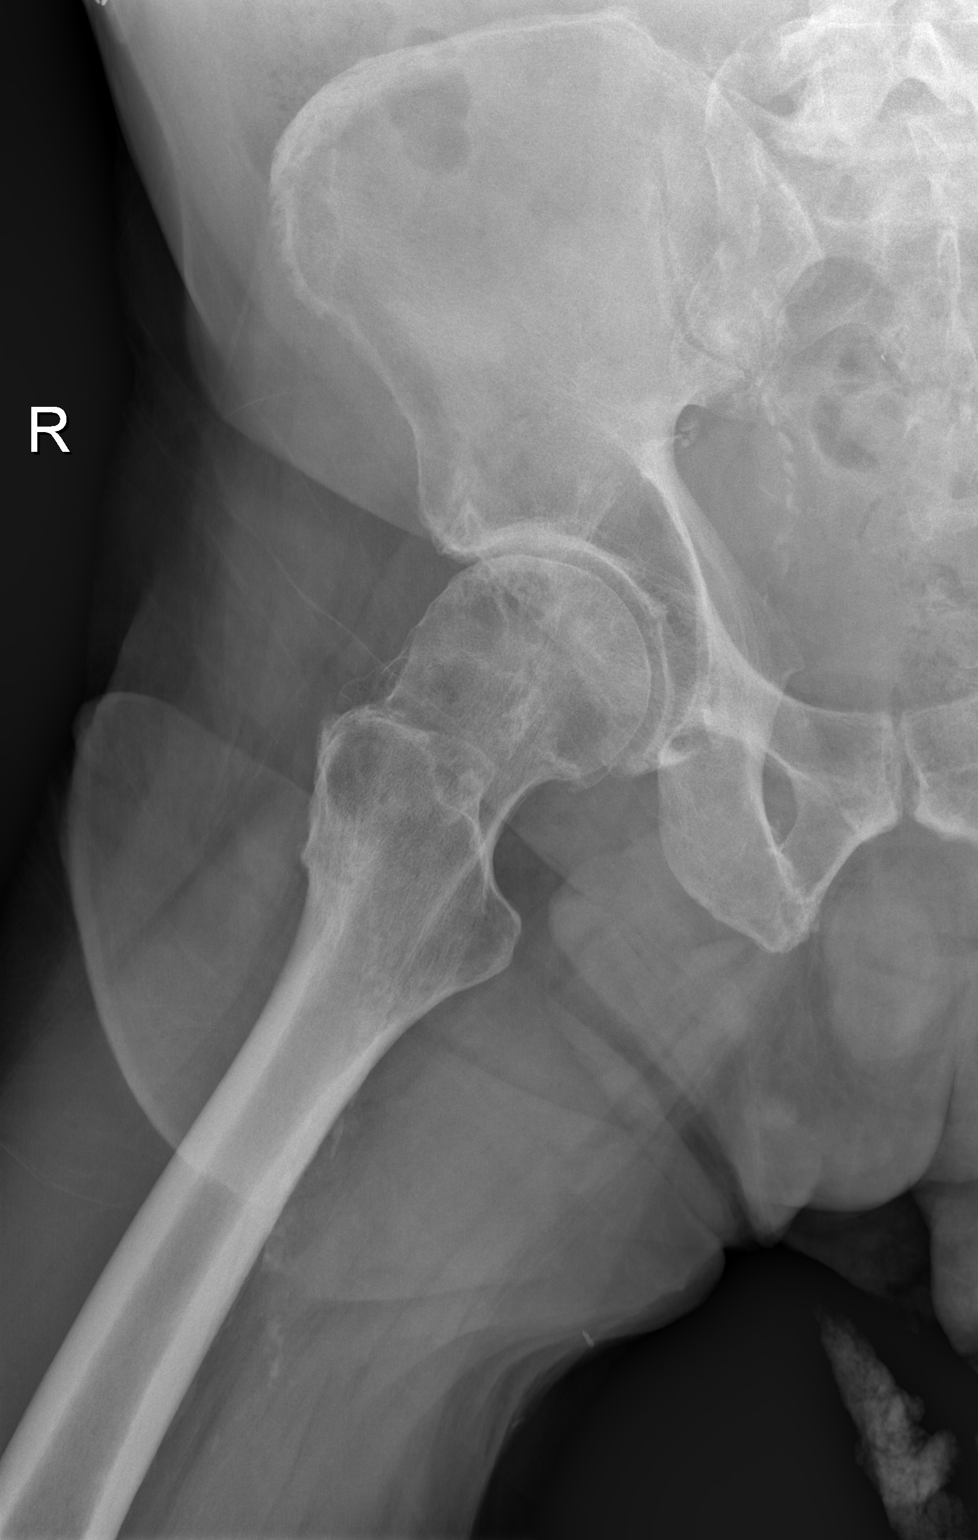

[3 of 3 positions shown; findings below may reference images not displayed]

FINDINGS: Moderate osteoarthritic change of the right hip and mild
osteoarthritic change of the left hip without significant change. No
acute fracture or dislocation. Degenerative changes of the spine and
sacroiliac joints.
IMPRESSION: No acute findings.

Moderate osteoarthritic change of the right hip and mild
osteoarthritic change of the left hip which is stable.

## 2020-01-03 IMAGING — CR DG LUMBAR SPINE COMPLETE 4+V
5 series · 5 of 5 positions shown · non-contrast
Comparison: 10/28/2018 and CT 06/05/2015

CLINICAL DATA: Chronic low back pain.

EXAM:
LUMBAR SPINE - COMPLETE 4+ VIEW

[t lumbar spine ap]
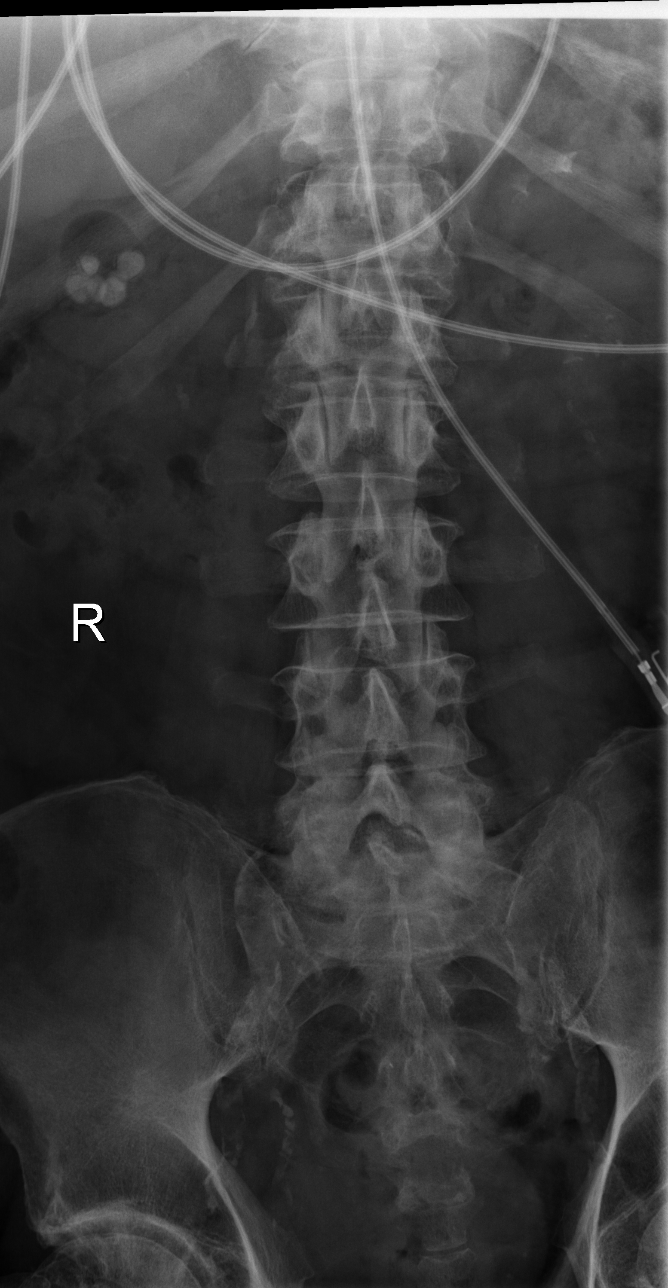

[t lumbar spine obl (1 of 2)]
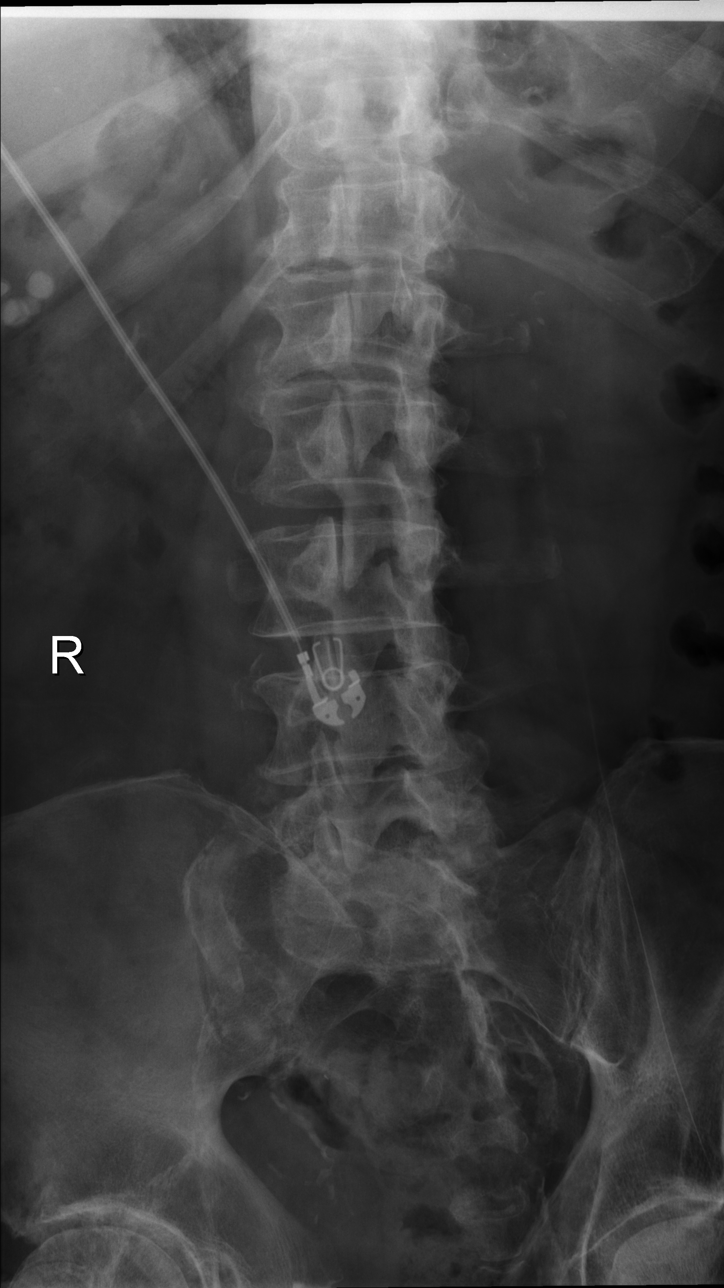

[t lumbar spine obl (2 of 2)]
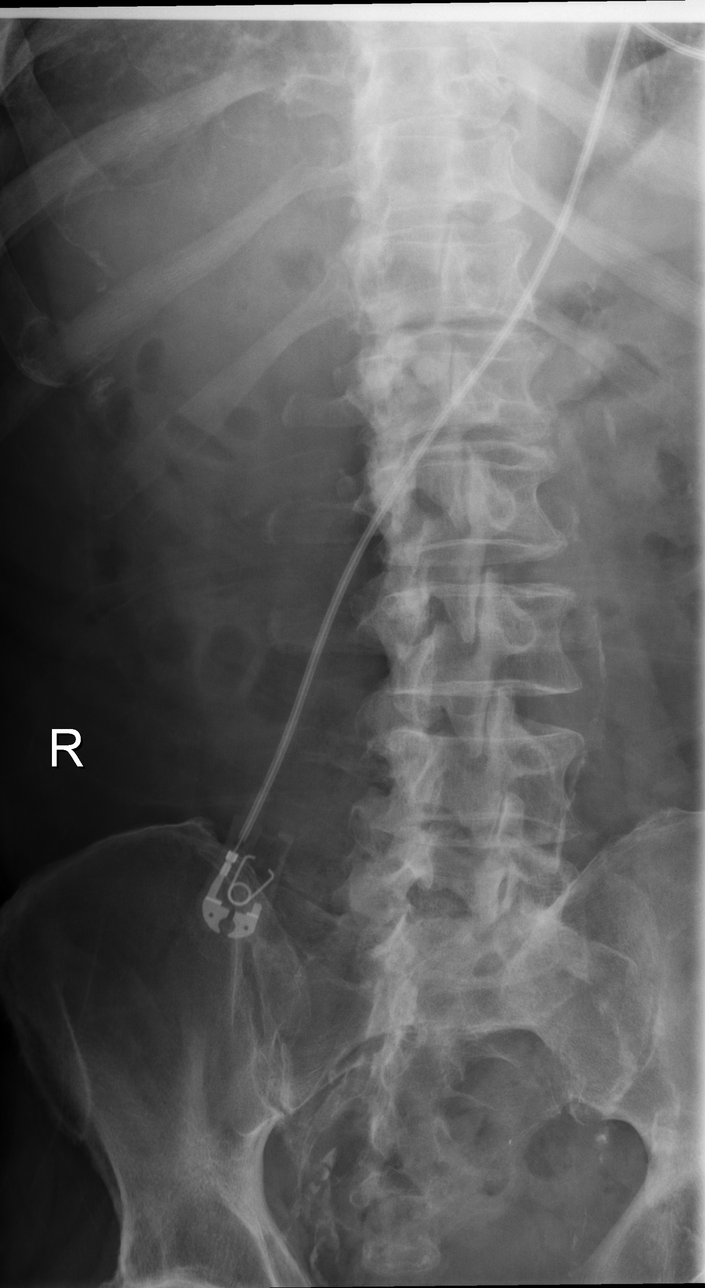

[t lumbar spine lat]
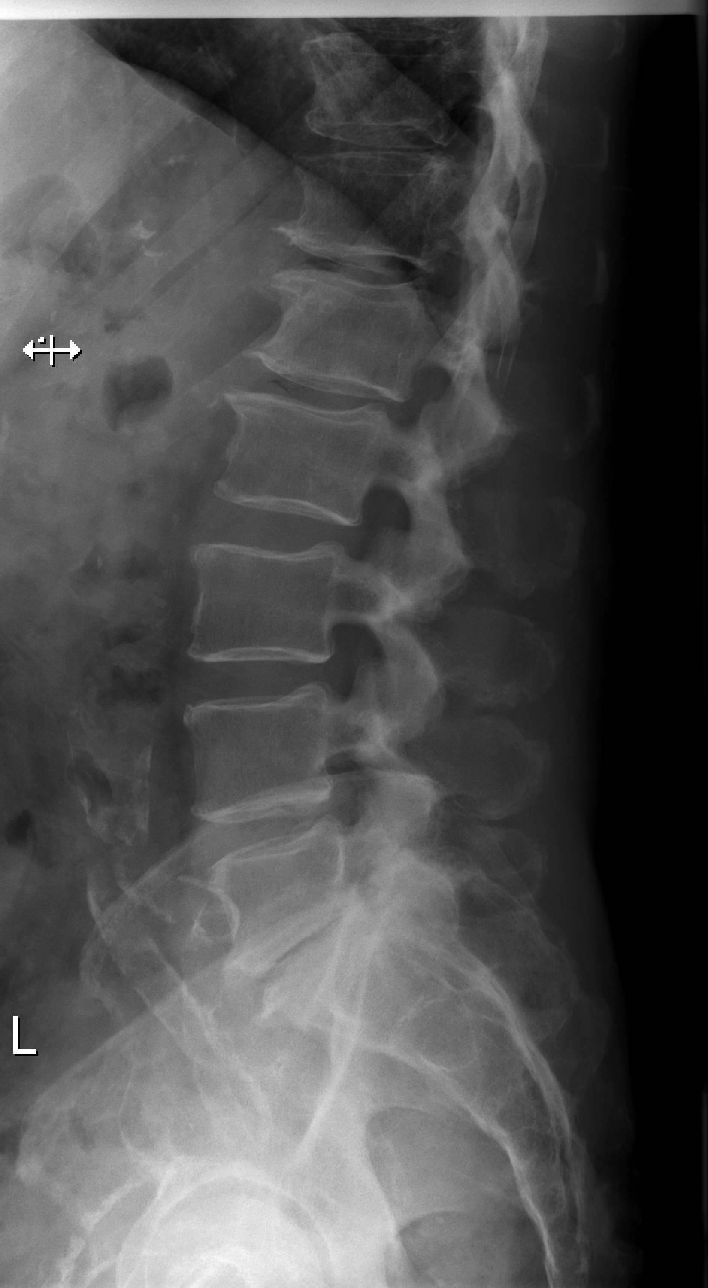

[t lumbar l-5 s-1 spot]
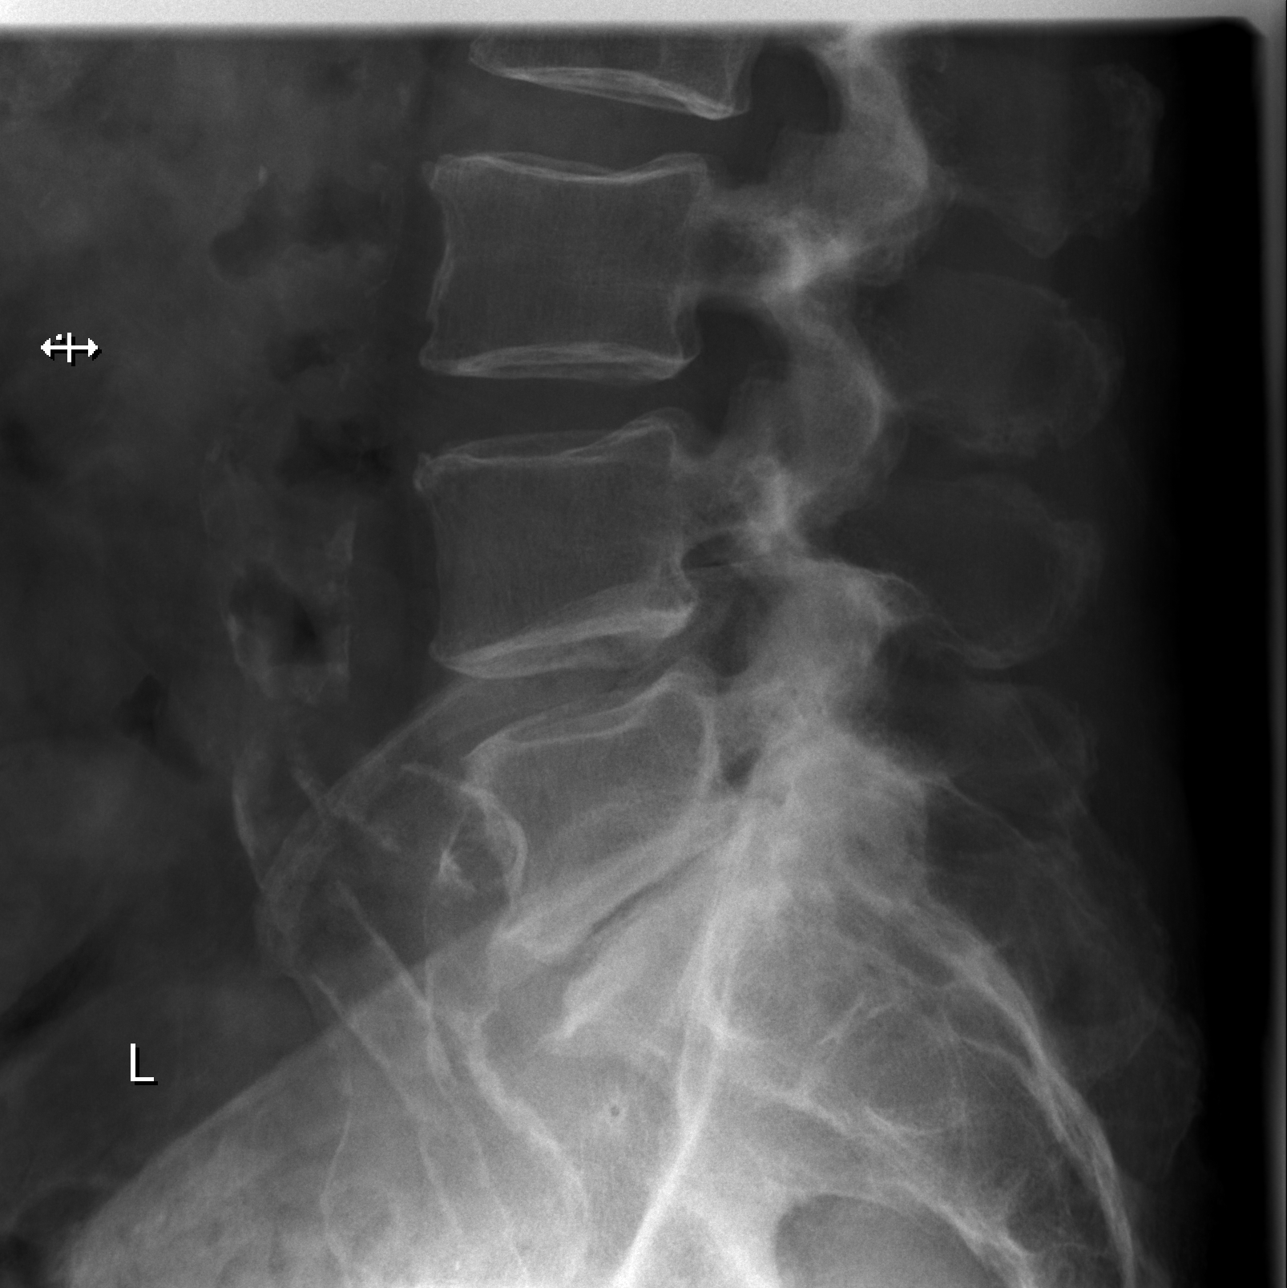

[5 of 5 positions shown; findings below may reference images not displayed]

FINDINGS: Vertebral body alignment and heights are normal. There is mild to
moderate spondylosis of the lumbar spine to include moderate facet
arthropathy. No evidence of compression fracture or
spondylolisthesis. Moderate disc space narrowing at the L5-S1 level
and to lesser extent at the L4-5 level and L1-2 level. Known
gallstones. Degenerative change right hip.
IMPRESSION: Mild to moderate spondylosis of the lumbar spine with disc disease
at the L5-S1 level greater than the L4-5 level.

Cholelithiasis.

## 2020-01-19 DIAGNOSIS — M6281 Muscle weakness (generalized): Secondary | ICD-10-CM | POA: Diagnosis not present

## 2020-01-19 DIAGNOSIS — F0391 Unspecified dementia with behavioral disturbance: Secondary | ICD-10-CM | POA: Diagnosis not present

## 2020-01-19 DIAGNOSIS — I69354 Hemiplegia and hemiparesis following cerebral infarction affecting left non-dominant side: Secondary | ICD-10-CM | POA: Diagnosis not present

## 2020-01-20 DIAGNOSIS — I69354 Hemiplegia and hemiparesis following cerebral infarction affecting left non-dominant side: Secondary | ICD-10-CM | POA: Diagnosis not present

## 2020-01-20 DIAGNOSIS — M6281 Muscle weakness (generalized): Secondary | ICD-10-CM | POA: Diagnosis not present

## 2020-01-20 DIAGNOSIS — F0391 Unspecified dementia with behavioral disturbance: Secondary | ICD-10-CM | POA: Diagnosis not present

## 2020-01-21 DIAGNOSIS — M6281 Muscle weakness (generalized): Secondary | ICD-10-CM | POA: Diagnosis not present

## 2020-01-21 DIAGNOSIS — I69354 Hemiplegia and hemiparesis following cerebral infarction affecting left non-dominant side: Secondary | ICD-10-CM | POA: Diagnosis not present

## 2020-01-21 DIAGNOSIS — F0391 Unspecified dementia with behavioral disturbance: Secondary | ICD-10-CM | POA: Diagnosis not present

## 2020-01-24 DIAGNOSIS — I69354 Hemiplegia and hemiparesis following cerebral infarction affecting left non-dominant side: Secondary | ICD-10-CM | POA: Diagnosis not present

## 2020-01-24 DIAGNOSIS — M6281 Muscle weakness (generalized): Secondary | ICD-10-CM | POA: Diagnosis not present

## 2020-01-24 DIAGNOSIS — F0391 Unspecified dementia with behavioral disturbance: Secondary | ICD-10-CM | POA: Diagnosis not present

## 2020-01-25 DIAGNOSIS — M6281 Muscle weakness (generalized): Secondary | ICD-10-CM | POA: Diagnosis not present

## 2020-01-25 DIAGNOSIS — F0391 Unspecified dementia with behavioral disturbance: Secondary | ICD-10-CM | POA: Diagnosis not present

## 2020-01-25 DIAGNOSIS — I69354 Hemiplegia and hemiparesis following cerebral infarction affecting left non-dominant side: Secondary | ICD-10-CM | POA: Diagnosis not present

## 2020-01-26 DIAGNOSIS — F0391 Unspecified dementia with behavioral disturbance: Secondary | ICD-10-CM | POA: Diagnosis not present

## 2020-01-26 DIAGNOSIS — M6281 Muscle weakness (generalized): Secondary | ICD-10-CM | POA: Diagnosis not present

## 2020-01-26 DIAGNOSIS — I69354 Hemiplegia and hemiparesis following cerebral infarction affecting left non-dominant side: Secondary | ICD-10-CM | POA: Diagnosis not present

## 2020-01-27 DIAGNOSIS — M6281 Muscle weakness (generalized): Secondary | ICD-10-CM | POA: Diagnosis not present

## 2020-01-27 DIAGNOSIS — F0391 Unspecified dementia with behavioral disturbance: Secondary | ICD-10-CM | POA: Diagnosis not present

## 2020-01-27 DIAGNOSIS — I69354 Hemiplegia and hemiparesis following cerebral infarction affecting left non-dominant side: Secondary | ICD-10-CM | POA: Diagnosis not present

## 2020-01-28 DIAGNOSIS — M6281 Muscle weakness (generalized): Secondary | ICD-10-CM | POA: Diagnosis not present

## 2020-01-28 DIAGNOSIS — F0391 Unspecified dementia with behavioral disturbance: Secondary | ICD-10-CM | POA: Diagnosis not present

## 2020-01-28 DIAGNOSIS — I69354 Hemiplegia and hemiparesis following cerebral infarction affecting left non-dominant side: Secondary | ICD-10-CM | POA: Diagnosis not present

## 2020-07-22 ENCOUNTER — Emergency Department (HOSPITAL_COMMUNITY): Payer: Medicare Other

## 2020-07-22 ENCOUNTER — Other Ambulatory Visit: Payer: Self-pay

## 2020-07-22 ENCOUNTER — Encounter (HOSPITAL_COMMUNITY): Payer: Self-pay | Admitting: Emergency Medicine

## 2020-07-22 ENCOUNTER — Emergency Department (HOSPITAL_COMMUNITY)
Admission: EM | Admit: 2020-07-22 | Discharge: 2020-07-23 | Disposition: A | Discharge status: Home / Self Care | Payer: Medicare Other | Attending: Emergency Medicine | Admitting: Emergency Medicine

## 2020-07-22 DIAGNOSIS — Z7902 Long term (current) use of antithrombotics/antiplatelets: Secondary | ICD-10-CM | POA: Insufficient documentation

## 2020-07-22 DIAGNOSIS — N183 Chronic kidney disease, stage 3 unspecified: Secondary | ICD-10-CM | POA: Insufficient documentation

## 2020-07-22 DIAGNOSIS — F0390 Unspecified dementia without behavioral disturbance: Secondary | ICD-10-CM | POA: Insufficient documentation

## 2020-07-22 DIAGNOSIS — E1122 Type 2 diabetes mellitus with diabetic chronic kidney disease: Secondary | ICD-10-CM | POA: Insufficient documentation

## 2020-07-22 DIAGNOSIS — E875 Hyperkalemia: Secondary | ICD-10-CM | POA: Diagnosis not present

## 2020-07-22 DIAGNOSIS — Z8616 Personal history of COVID-19: Secondary | ICD-10-CM | POA: Diagnosis not present

## 2020-07-22 DIAGNOSIS — I129 Hypertensive chronic kidney disease with stage 1 through stage 4 chronic kidney disease, or unspecified chronic kidney disease: Secondary | ICD-10-CM | POA: Diagnosis not present

## 2020-07-22 DIAGNOSIS — Z7982 Long term (current) use of aspirin: Secondary | ICD-10-CM | POA: Insufficient documentation

## 2020-07-22 DIAGNOSIS — Z794 Long term (current) use of insulin: Secondary | ICD-10-CM | POA: Diagnosis not present

## 2020-07-22 DIAGNOSIS — Z79899 Other long term (current) drug therapy: Secondary | ICD-10-CM | POA: Insufficient documentation

## 2020-07-22 DIAGNOSIS — W06XXXA Fall from bed, initial encounter: Secondary | ICD-10-CM | POA: Diagnosis not present

## 2020-07-22 DIAGNOSIS — W19XXXA Unspecified fall, initial encounter: Secondary | ICD-10-CM

## 2020-07-22 LAB — COMPREHENSIVE METABOLIC PANEL
ALT: 15 U/L (ref 0–44)
AST: 37 U/L (ref 15–41)
Albumin: 3 g/dL — ABNORMAL LOW (ref 3.5–5.0)
Alkaline Phosphatase: 93 U/L (ref 38–126)
Anion gap: 6 (ref 5–15)
BUN: 31 mg/dL — ABNORMAL HIGH (ref 8–23)
CO2: 26 mmol/L (ref 22–32)
Calcium: 8.8 mg/dL — ABNORMAL LOW (ref 8.9–10.3)
Chloride: 107 mmol/L (ref 98–111)
Creatinine, Ser: 1.57 mg/dL — ABNORMAL HIGH (ref 0.61–1.24)
GFR, Estimated: 43 mL/min — ABNORMAL LOW (ref >60)
Glucose, Bld: 220 mg/dL — ABNORMAL HIGH (ref 70–99)
Potassium: 5.4 mmol/L — ABNORMAL HIGH (ref 3.5–5.1)
Sodium: 139 mmol/L (ref 135–145)
Total Bilirubin: 0.8 mg/dL (ref 0.3–1.2)
Total Protein: 6.2 g/dL — ABNORMAL LOW (ref 6.5–8.1)

## 2020-07-22 LAB — CBC WITH DIFFERENTIAL/PLATELET
Abs Immature Granulocytes: 0.02 10*3/uL (ref 0.00–0.07)
Basophils Absolute: 0 10*3/uL (ref 0.0–0.1)
Basophils Relative: 0 %
Eosinophils Absolute: 0.4 10*3/uL (ref 0.0–0.5)
Eosinophils Relative: 5 %
HCT: 35.3 % — ABNORMAL LOW (ref 39.0–52.0)
Hemoglobin: 11.4 g/dL — ABNORMAL LOW (ref 13.0–17.0)
Immature Granulocytes: 0 %
Lymphocytes Relative: 16 %
Lymphs Abs: 1.4 10*3/uL (ref 0.7–4.0)
MCH: 31.6 pg (ref 26.0–34.0)
MCHC: 32.3 g/dL (ref 30.0–36.0)
MCV: 97.8 fL (ref 80.0–100.0)
Monocytes Absolute: 1 10*3/uL (ref 0.1–1.0)
Monocytes Relative: 11 %
Neutro Abs: 5.9 10*3/uL (ref 1.7–7.7)
Neutrophils Relative %: 68 %
Platelets: 282 10*3/uL (ref 150–400)
RBC: 3.61 MIL/uL — ABNORMAL LOW (ref 4.22–5.81)
RDW: 14.2 % (ref 11.5–15.5)
WBC: 8.7 10*3/uL (ref 4.0–10.5)
nRBC: 0 % (ref 0.0–0.2)

## 2020-07-22 LAB — PROTIME-INR
INR: 1 (ref 0.8–1.2)
Prothrombin Time: 13.5 seconds (ref 11.4–15.2)

## 2020-07-22 NOTE — ED Notes (Signed)
Report given to [facility].

## 2020-07-22 NOTE — ED Triage Notes (Signed)
Pt BIB [facility] from [facility]. Pt noted to have fallen out of bed while sleeping. Unwitnessed fall. Staff stated he hit head. Pt has a hx of dementia, pt states that he did and did not hit his head. Pt denies pain. No deformities. Pt takes plavix.   EMS VS- BP 130/68, 60 HR, 18RR, 94% on RA, 97.2 temp, CBG 222
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
## 2020-07-22 NOTE — ED Notes (Signed)
Patient transported to CT 

## 2020-07-22 NOTE — ED Notes (Signed)
Patient returned from CT

## 2020-07-22 NOTE — ED Notes (Signed)
PTAR called at 20:49; no ETA

## 2020-07-22 NOTE — Discharge Instructions (Addendum)
1.  Patient's potassium was mildly above the normal range at 5.4 mEq with signs of hemolysis.  Discontinue the patient's potassium for the next 5 days.  Hold Zestril for 5 days.  Patient will need a recheck on potassium in 2 days.  Patient should have recheck with provider in 2 to 3 days with review of basic chemistry panel on Monday. 2.  There was no signs of traumatic intracranial injury or other fractures.  Patient can be given Tylenol as needed for pain.

## 2020-07-22 NOTE — ED Notes (Signed)
Pt moved to room 14 awaiting PTAR.  Pt is alert to self and sleepy.  Repositioned in bed and covered with warm blankets

## 2020-07-23 NOTE — ED Notes (Signed)
Patient waiting on PTAR coke given.

## 2022-10-02 NOTE — Telephone Encounter (Signed)
error
